# Patient Record
Sex: Female | Born: 1945
Health system: Southern US, Community
[De-identification: ages and names within clinical notes are randomized; demographics above are authoritative.]

## PROBLEM LIST (undated history)

## (undated) DIAGNOSIS — T8859XA Other complications of anesthesia, initial encounter: Secondary | ICD-10-CM

## (undated) DIAGNOSIS — I679 Cerebrovascular disease, unspecified: Secondary | ICD-10-CM

## (undated) DIAGNOSIS — D249 Benign neoplasm of unspecified breast: Secondary | ICD-10-CM

## (undated) DIAGNOSIS — G459 Transient cerebral ischemic attack, unspecified: Secondary | ICD-10-CM

## (undated) DIAGNOSIS — T4145XA Adverse effect of unspecified anesthetic, initial encounter: Secondary | ICD-10-CM

## (undated) DIAGNOSIS — E785 Hyperlipidemia, unspecified: Secondary | ICD-10-CM

## (undated) DIAGNOSIS — L409 Psoriasis, unspecified: Secondary | ICD-10-CM

## (undated) DIAGNOSIS — M8580 Other specified disorders of bone density and structure, unspecified site: Secondary | ICD-10-CM

## (undated) DIAGNOSIS — I639 Cerebral infarction, unspecified: Secondary | ICD-10-CM

## (undated) HISTORY — DX: Transient cerebral ischemic attack, unspecified: G45.9

## (undated) HISTORY — PX: ABDOMINAL HYSTERECTOMY: SHX81

## (undated) HISTORY — DX: Cerebrovascular disease, unspecified: I67.9

## (undated) HISTORY — DX: Benign neoplasm of unspecified breast: D24.9

## (undated) HISTORY — DX: Psoriasis, unspecified: L40.9

## (undated) HISTORY — DX: Other specified disorders of bone density and structure, unspecified site: M85.80

## (undated) HISTORY — DX: Hyperlipidemia, unspecified: E78.5

---

## 1973-01-06 DIAGNOSIS — D249 Benign neoplasm of unspecified breast: Secondary | ICD-10-CM

## 1973-01-06 HISTORY — DX: Benign neoplasm of unspecified breast: D24.9

## 1985-01-06 HISTORY — PX: PARTIAL HYSTERECTOMY: SHX80

## 1996-01-07 HISTORY — PX: OTHER SURGICAL HISTORY: SHX169

## 1998-08-09 ENCOUNTER — Other Ambulatory Visit: Admission: RE | Admit: 1998-08-09 | Discharge: 1998-08-09 | Payer: Self-pay | Admitting: Gynecology

## 2000-04-06 HISTORY — PX: OTHER SURGICAL HISTORY: SHX169

## 2000-04-28 ENCOUNTER — Encounter: Payer: Self-pay | Admitting: Orthopedic Surgery

## 2000-04-28 ENCOUNTER — Encounter: Admission: RE | Admit: 2000-04-28 | Discharge: 2000-04-28 | Payer: Self-pay | Admitting: Orthopedic Surgery

## 2000-08-05 ENCOUNTER — Encounter (INDEPENDENT_AMBULATORY_CARE_PROVIDER_SITE_OTHER): Payer: Self-pay

## 2000-08-05 ENCOUNTER — Other Ambulatory Visit: Admission: RE | Admit: 2000-08-05 | Discharge: 2000-08-05 | Payer: Self-pay | Admitting: Gastroenterology

## 2001-07-13 ENCOUNTER — Other Ambulatory Visit: Admission: RE | Admit: 2001-07-13 | Discharge: 2001-07-13 | Payer: Self-pay | Admitting: *Deleted

## 2001-10-06 LAB — FECAL OCCULT BLOOD, GUAIAC: Fecal Occult Blood: NEGATIVE

## 2003-08-07 ENCOUNTER — Encounter: Payer: Self-pay | Admitting: Family Medicine

## 2003-11-08 ENCOUNTER — Ambulatory Visit: Payer: Self-pay | Admitting: Family Medicine

## 2003-11-17 ENCOUNTER — Ambulatory Visit: Payer: Self-pay | Admitting: Family Medicine

## 2004-01-17 ENCOUNTER — Ambulatory Visit: Payer: Self-pay | Admitting: Family Medicine

## 2004-03-05 ENCOUNTER — Ambulatory Visit: Payer: Self-pay | Admitting: Family Medicine

## 2004-06-18 ENCOUNTER — Ambulatory Visit (HOSPITAL_COMMUNITY): Admission: RE | Admit: 2004-06-18 | Discharge: 2004-06-18 | Payer: Self-pay | Admitting: Orthopaedic Surgery

## 2004-11-06 ENCOUNTER — Ambulatory Visit: Payer: Self-pay | Admitting: Family Medicine

## 2004-12-24 ENCOUNTER — Ambulatory Visit: Payer: Self-pay | Admitting: Family Medicine

## 2005-01-03 ENCOUNTER — Ambulatory Visit: Payer: Self-pay | Admitting: Family Medicine

## 2005-04-18 ENCOUNTER — Ambulatory Visit: Payer: Self-pay | Admitting: Family Medicine

## 2005-06-30 ENCOUNTER — Ambulatory Visit: Payer: Self-pay | Admitting: Family Medicine

## 2005-10-22 ENCOUNTER — Ambulatory Visit: Payer: Self-pay | Admitting: Gastroenterology

## 2005-10-23 ENCOUNTER — Ambulatory Visit: Payer: Self-pay | Admitting: Gastroenterology

## 2005-11-06 ENCOUNTER — Ambulatory Visit: Payer: Self-pay | Admitting: Gastroenterology

## 2005-11-06 LAB — CONVERTED CEMR LAB
Fecal Occult Blood: NEGATIVE
OCCULT 1: NEGATIVE
OCCULT 2: NEGATIVE
OCCULT 4: NEGATIVE
OCCULT 5: NEGATIVE

## 2006-02-25 ENCOUNTER — Ambulatory Visit (HOSPITAL_COMMUNITY): Admission: RE | Admit: 2006-02-25 | Discharge: 2006-02-25 | Payer: Self-pay | Admitting: Family Medicine

## 2006-03-11 ENCOUNTER — Ambulatory Visit: Payer: Self-pay | Admitting: Family Medicine

## 2006-05-06 ENCOUNTER — Ambulatory Visit (HOSPITAL_COMMUNITY): Admission: RE | Admit: 2006-05-06 | Discharge: 2006-05-06 | Payer: Self-pay | Admitting: Orthopaedic Surgery

## 2006-06-30 ENCOUNTER — Encounter: Payer: Self-pay | Admitting: Family Medicine

## 2006-06-30 DIAGNOSIS — E785 Hyperlipidemia, unspecified: Secondary | ICD-10-CM | POA: Insufficient documentation

## 2006-06-30 DIAGNOSIS — R7303 Prediabetes: Secondary | ICD-10-CM | POA: Insufficient documentation

## 2006-06-30 DIAGNOSIS — T7840XA Allergy, unspecified, initial encounter: Secondary | ICD-10-CM | POA: Insufficient documentation

## 2006-06-30 HISTORY — DX: Hyperlipidemia, unspecified: E78.5

## 2006-07-03 ENCOUNTER — Ambulatory Visit: Payer: Self-pay | Admitting: Family Medicine

## 2006-07-05 LAB — CONVERTED CEMR LAB
ALT: 41 units/L — ABNORMAL HIGH (ref 0–35)
AST: 39 units/L — ABNORMAL HIGH (ref 0–37)
Albumin: 4.1 g/dL (ref 3.5–5.2)
Alkaline Phosphatase: 60 units/L (ref 39–117)
BUN: 9 mg/dL (ref 6–23)
CO2: 30 meq/L (ref 19–32)
Calcium: 9 mg/dL (ref 8.4–10.5)
Chloride: 105 meq/L (ref 96–112)
Creatinine, Ser: 0.7 mg/dL (ref 0.4–1.2)
TSH: 3.67 microintl units/mL (ref 0.35–5.50)
Total Bilirubin: 1 mg/dL (ref 0.3–1.2)
VLDL: 20 mg/dL (ref 0–40)

## 2006-07-07 ENCOUNTER — Ambulatory Visit: Payer: Self-pay | Admitting: Family Medicine

## 2006-07-07 DIAGNOSIS — S335XXA Sprain of ligaments of lumbar spine, initial encounter: Secondary | ICD-10-CM | POA: Insufficient documentation

## 2006-09-30 ENCOUNTER — Ambulatory Visit: Payer: Self-pay | Admitting: Family Medicine

## 2006-10-05 ENCOUNTER — Encounter (INDEPENDENT_AMBULATORY_CARE_PROVIDER_SITE_OTHER): Payer: Self-pay | Admitting: *Deleted

## 2006-10-06 ENCOUNTER — Ambulatory Visit: Payer: Self-pay | Admitting: Family Medicine

## 2006-10-06 DIAGNOSIS — R740 Nonspecific elevation of levels of transaminase and lactic acid dehydrogenase [LDH]: Secondary | ICD-10-CM

## 2006-10-06 DIAGNOSIS — R7401 Elevation of levels of liver transaminase levels: Secondary | ICD-10-CM | POA: Insufficient documentation

## 2006-10-06 LAB — CONVERTED CEMR LAB: AST: 34 units/L (ref 0–37)

## 2006-10-19 ENCOUNTER — Ambulatory Visit: Payer: Self-pay | Admitting: Family Medicine

## 2007-03-03 ENCOUNTER — Encounter: Payer: Self-pay | Admitting: Family Medicine

## 2007-04-27 ENCOUNTER — Telehealth: Payer: Self-pay | Admitting: Family Medicine

## 2007-06-30 ENCOUNTER — Emergency Department (HOSPITAL_COMMUNITY): Admission: EM | Admit: 2007-06-30 | Discharge: 2007-06-30 | Payer: Self-pay | Admitting: Emergency Medicine

## 2007-07-08 ENCOUNTER — Ambulatory Visit: Payer: Self-pay | Admitting: Family Medicine

## 2007-07-08 LAB — CONVERTED CEMR LAB
ALT: 19 units/L (ref 0–35)
AST: 24 units/L (ref 0–37)
Albumin: 3.9 g/dL (ref 3.5–5.2)
Alkaline Phosphatase: 50 units/L (ref 39–117)
BUN: 10 mg/dL (ref 6–23)
Bilirubin, Direct: 0.1 mg/dL (ref 0.0–0.3)
CO2: 30 meq/L (ref 19–32)
Calcium: 9.1 mg/dL (ref 8.4–10.5)
Chloride: 104 meq/L (ref 96–112)
Cholesterol: 181 mg/dL (ref 0–200)
Creatinine, Ser: 0.8 mg/dL (ref 0.4–1.2)
Creatinine,U: 243.2 mg/dL
GFR calc Af Amer: 93 mL/min
GFR calc non Af Amer: 77 mL/min
Glucose, Bld: 110 mg/dL — ABNORMAL HIGH (ref 70–99)
HDL: 41.7 mg/dL (ref >39.0)
LDL Cholesterol: 120 mg/dL — ABNORMAL HIGH (ref 0–99)
Microalb Creat Ratio: 4.1 mg/g (ref 0.0–30.0)
Microalb, Ur: 1 mg/dL (ref 0.0–1.9)
Potassium: 4 meq/L (ref 3.5–5.1)
Sodium: 141 meq/L (ref 135–145)
TSH: 3.57 microintl units/mL (ref 0.35–5.50)
Total Bilirubin: 0.9 mg/dL (ref 0.3–1.2)
Total CHOL/HDL Ratio: 4.3
Total Protein: 7 g/dL (ref 6.0–8.3)
Triglycerides: 95 mg/dL (ref 0–149)
VLDL: 19 mg/dL (ref 0–40)

## 2007-07-13 ENCOUNTER — Ambulatory Visit: Payer: Self-pay | Admitting: Family Medicine

## 2007-09-20 ENCOUNTER — Telehealth: Payer: Self-pay | Admitting: Family Medicine

## 2007-10-12 ENCOUNTER — Ambulatory Visit: Payer: Self-pay | Admitting: Internal Medicine

## 2007-10-25 ENCOUNTER — Encounter: Payer: Self-pay | Admitting: Internal Medicine

## 2007-10-25 ENCOUNTER — Ambulatory Visit: Payer: Self-pay | Admitting: Internal Medicine

## 2007-10-25 LAB — HM COLONOSCOPY

## 2007-10-26 ENCOUNTER — Encounter: Payer: Self-pay | Admitting: Internal Medicine

## 2007-10-26 ENCOUNTER — Ambulatory Visit: Payer: Self-pay | Admitting: Family Medicine

## 2007-10-27 DIAGNOSIS — K5730 Diverticulosis of large intestine without perforation or abscess without bleeding: Secondary | ICD-10-CM

## 2007-10-27 DIAGNOSIS — D126 Benign neoplasm of colon, unspecified: Secondary | ICD-10-CM | POA: Insufficient documentation

## 2007-10-27 HISTORY — DX: Diverticulosis of large intestine without perforation or abscess without bleeding: K57.30

## 2008-03-07 ENCOUNTER — Encounter: Payer: Self-pay | Admitting: Family Medicine

## 2008-05-25 ENCOUNTER — Emergency Department (HOSPITAL_COMMUNITY): Admission: EM | Admit: 2008-05-25 | Discharge: 2008-05-25 | Payer: Self-pay | Admitting: Family Medicine

## 2008-10-18 ENCOUNTER — Ambulatory Visit: Payer: Self-pay | Admitting: Family Medicine

## 2008-10-18 LAB — CONVERTED CEMR LAB
ALT: 18 units/L (ref 0–35)
BUN: 9 mg/dL (ref 6–23)
Basophils Absolute: 0 10*3/uL (ref 0.0–0.1)
CO2: 31 meq/L (ref 19–32)
Chloride: 104 meq/L (ref 96–112)
Cholesterol: 157 mg/dL (ref 0–200)
Creatinine, Ser: 0.8 mg/dL (ref 0.4–1.2)
Eosinophils Absolute: 0.1 10*3/uL (ref 0.0–0.7)
HCT: 40.8 % (ref 36.0–46.0)
Hemoglobin: 14.1 g/dL (ref 12.0–15.0)
Lymphs Abs: 1.4 10*3/uL (ref 0.7–4.0)
MCHC: 34.6 g/dL (ref 30.0–36.0)
MCV: 98.9 fL (ref 78.0–100.0)
Neutro Abs: 2.1 10*3/uL (ref 1.4–7.7)
RDW: 12.2 % (ref 11.5–14.6)
Total Protein: 7.4 g/dL (ref 6.0–8.3)
Triglycerides: 53 mg/dL (ref 0.0–149.0)

## 2008-10-23 ENCOUNTER — Ambulatory Visit: Payer: Self-pay | Admitting: Family Medicine

## 2008-10-23 DIAGNOSIS — M79609 Pain in unspecified limb: Secondary | ICD-10-CM | POA: Insufficient documentation

## 2008-10-23 DIAGNOSIS — R1310 Dysphagia, unspecified: Secondary | ICD-10-CM | POA: Insufficient documentation

## 2008-12-14 ENCOUNTER — Ambulatory Visit: Payer: Self-pay | Admitting: Family Medicine

## 2009-01-24 ENCOUNTER — Telehealth: Payer: Self-pay | Admitting: Family Medicine

## 2009-03-14 ENCOUNTER — Encounter: Payer: Self-pay | Admitting: Family Medicine

## 2009-04-18 ENCOUNTER — Ambulatory Visit: Payer: Self-pay | Admitting: Family Medicine

## 2009-04-18 DIAGNOSIS — R030 Elevated blood-pressure reading, without diagnosis of hypertension: Secondary | ICD-10-CM | POA: Insufficient documentation

## 2009-04-18 DIAGNOSIS — F489 Nonpsychotic mental disorder, unspecified: Secondary | ICD-10-CM | POA: Insufficient documentation

## 2009-05-30 ENCOUNTER — Ambulatory Visit: Payer: Self-pay | Admitting: Family Medicine

## 2009-08-09 ENCOUNTER — Encounter (INDEPENDENT_AMBULATORY_CARE_PROVIDER_SITE_OTHER): Payer: Self-pay | Admitting: *Deleted

## 2009-10-11 ENCOUNTER — Encounter: Payer: Self-pay | Admitting: Family Medicine

## 2009-10-11 ENCOUNTER — Telehealth: Payer: Self-pay | Admitting: Family Medicine

## 2010-02-05 NOTE — Assessment & Plan Note (Signed)
Summary: FOLLOW UP / LFW   Vital Signs:  Patient profile:   65 year old female Weight:      158.25 pounds Temp:     98.1 degrees F oral Pulse rate:   68 / minute Pulse rhythm:   regular BP sitting:   160 / 94  (left arm) Cuff size:   regular  Vitals Entered By: Sydell Axon LPN (April 18, 2009 2:22 PM) CC: 4 mouth follow-up   History of Present Illness: Pt here for 4 mos recheck. She has been at work. Has significant stress there and feels that probably has negatively affected her BP.  She saw Dr Londell Moh for her skin and was told she has a lipoma of the supraclavicular area. Yest she developed pain from the sciatic notch on the right. She denies trauma to area.  She also had an pisode of awakening in the middle of the night taking deep breaths and feeling air starved. She does not remember a nightmare but feel she was probably anxious from something she had been dreaming. She has had this happen a few times in the past, twice now in the last two weeks.  Problems Prior to Update: 1)  Swelling Unspec Nature Bone Soft Tissue&skin  (ICD-239.2) 2)  Problems With Swallowing and Mastication  (ICD-V41.6) 3)  Leg Pain, Bilateral  (ICD-729.5) 4)  Diverticulosis of Colon  (ICD-562.10) 5)  Colonic Polyps  (ICD-211.3) 6)  Elevation, Transaminase/ldh Levels  (ICD-790.4) 7)  Screening For Malignannt Neoplasm, Site Nec  (ICD-V76.49) 8)  Lumbar Strain  (ICD-847.2) 9)  Health Maintenance Exam  (ICD-V70.0) 10)  Glucose Intolerance, Hx of  (ICD-V12.2) 11)  Allergy, Environmental  (ICD-995.3) 12)  Hyperlipidemia  (ICD-272.4)  Medications Prior to Update: 1)  Pravastatin Sodium 40 Mg Tabs (Pravastatin Sodium) .... Take 1 Tablet By Mouth At Bedtime 2)  K-Tabs   Tbcr (Potassium Chloride Tbcr) 3)  Vitamin E Mtc   Caps (Vitamin E Caps) 4)  Vitamin C   Tabs (Ascorbic Acid Tabs) 5)  Cranberry Pills 6)  Calcium 500/d   Tabs (Calcium Carbonate-Vitamin D Tabs) .... Three Times A Day 7)  Fish Oil    Caps (Omega-3 Fatty Acids Caps) .... Two Times A Day 8)  Claritin 10 Mg  Tabs (Loratadine) .... As Needed 9)  Vitamin B-12   Tabs (Cyanocobalamin Tabs) 10)  Tylenol Arthritis Pain   Tbcr (Acetaminophen Tbcr) 11)  Cinnamon   Caps (Cinnamon Caps) .... Two Per Day 12)  Hydrocortisone 2.5 % Ext Crea (Hydrocortisone) .... Apply To Area Two Times A Day As Needed  Allergies: 1)  ! Penicillin V Potassium 2)  ! Sulfazine 3)  ! Bayer Aspirin 4)  ! Benadryl 5)  ! * Clorox 6)  ! Epinephrine 7)  ! * Latex 8)  ! * Eggs  Physical Exam  General:  Well-developed,well-nourished,in no acute distress; alert,appropriate and cooperative throughout examination, nontoxic. Head:  Normocephalic and atraumatic without obvious abnormalities. No apparent alopecia or balding. Sinuses nontender. Eyes:  Conjunctiva clear bilaterally.  Very small early skin tag on the right lid margin. Ears:  External ear exam shows no significant lesions or deformities.  Otoscopic examination reveals clear canals, tympanic membranes are intact bilaterally, retraction, inflammation or discharge. Hearing is slightly decreased  bilaterally. TMs dull. Nose:  External nasal examination shows no deformity or inflammation. Nasal mucosa are pink and moist without lesions or exudates. Mouth:  Oral mucosa and oropharynx without lesions or exudates.  Teeth in good repair. Roof  of mouth nml. Neck:  No deformities, masses, or tenderness noted. Chest Wall:  No deformities, masses, or tenderness noted. Lungs:  Normal respiratory effort, chest expands symmetrically. Lungs are clear to auscultation, no crackles or wheezes. Heart:  Normal rate and regular rhythm. S1 and S2 normal without gallop, murmur, click, rub or other extra sounds. Abdomen:  Bowel sounds positive,abdomen soft and non-tender without masses, organomegaly or hernias noted. Skin:  Benign moles sparsely distributed. 2 cm midclavicular soft tissue swelling, now believed to be  cyst. 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 Use heat and ice. Her updated medication list for this problem includes:    Tylenol Arthritis Pain Tbcr (Acetaminophen tbcr)  Discussed use of moist heat or ice, modified activities, medications, and stretching/strengthening exercises. Back care instructions given. To be seen in 2 weeks if no improvement; sooner if worsening of symptoms.   Problem # 2:  TENSION (ICD-300.9) Assessment: New Discussed ways to combat. Probably reason for awakenings.  Could make BP high.  Problem # 3:  ELEVATED BLOOD PRESSURE WITHOUT DIAGNOSIS OF HYPERTENSION (ICD-796.2) Assessment: New Will follow. Check BP at home regularly once a day the same relative tiome and RTC one week if generally higher than 130/80.  Complete Medication List: 1)  Pravastatin Sodium 40 Mg Tabs (Pravastatin sodium) .... Take 1 tablet by mouth at bedtime 2)  K-tabs Tbcr (Potassium chloride tbcr) 3)  Vitamin E Mtc Caps (Vitamin e caps) 4)  Vitamin C Tabs (Ascorbic acid tabs) 5)  Cranberry Pills  6)  Calcium 500/d Tabs (Calcium carbonate-vitamin d tabs) .... Three times a day 7)  Fish Oil Caps (Omega-3 fatty acids caps) .... Two times a day 8)  Claritin 10 Mg Tabs (Loratadine) .... As needed 9)  Vitamin B-12 Tabs (Cyanocobalamin tabs) .... Take one by mouth daily 10)  Tylenol Arthritis Pain Tbcr (Acetaminophen tbcr) 11)  Cinnamon Caps (Cinnamon caps) .... Two per day 12)  Hydrocortisone 2.5 % Ext Crea (Hydrocortisone) .... Apply to area two times a day as needed  Patient Instructions: 1)  RTC 1 week for BP check. Check BP at home at same time of day. Should be 130/80 or lower.  Current Allergies (reviewed today): !  PENICILLIN V POTASSIUM ! SULFAZINE ! BAYER ASPIRIN ! BENADRYL ! * CLOROX ! EPINEPHRINE ! * LATEX ! * EGGS

## 2010-02-05 NOTE — Progress Notes (Signed)
Summary: needs letter for flu shot  Phone Note Call from Patient Call back at Home Phone [phone]   Caller: Patient Call For: Shaune Leeks MD Summary of Call: Pt is asking for a letter stating she cant get a flu shot because she is allergic to eggs, please call when ready. Initial call taken by: Lowella Petties CMA,  October 11, 2009 9:36 AM  Follow-up for Phone Call        Typed as requested. Follow-up by: Shaune Leeks MD,  October 11, 2009 1:14 PM  Additional Follow-up for Phone Call Additional follow up Details #1::        LMOM, per pt, that letter is ready. Additional Follow-up by: Lowella Petties CMA,  October 11, 2009 2:24 PM

## 2010-02-05 NOTE — Assessment & Plan Note (Signed)
Summary: CHECK BP,THUMB PAIN/CLE   Vital Signs:  Patient profile:   65 year old female Weight:      156 pounds Temp:     98.4 degrees F oral Pulse rate:   84 / minute Pulse rhythm:   regular BP sitting:   114 / 80  (left arm) Cuff size:   regular  Vitals Entered By: Sydell Axon LPN (May 30, 2009 8:15 AM) CC: Check BP, left thumb pain, BP with patient's machine 138/80   History of Present Illness: Pt here for recheck of BP. Her machine, when checked here, reads slightly high systolically. 138/80 (her machine) vs 114/80 by [place].  Her nos at home appeared to be fluctuating and it appears her machine is probably on the fritz. 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 She is still having fatigue problems.  She had some cream given to her by a friend, Voltaren Gel, and shen would like to try it. We discussed the poss side effects. She would like a prescription. She would also like a prescription for spray kenalog for her psoriasis as it is much easier to apply tio the scalp. She got a sample from another doctor last year and it works really well.  Problems Prior to Update: 1)  Elevated Blood Pressure Without Diagnosis of Hypertension  (ICD-796.2) 2)  Tension  (ICD-300.9) 3)  Sciatica, Right From Sciatic Notch  (ICD-724.3) 4)  Swelling Unspec Nature Bone Soft Tissue&skin  (ICD-239.2) 5)  Problems With Swallowing and Mastication  (ICD-V41.6) 6)  Leg Pain, Bilateral  (ICD-729.5) 7)  Diverticulosis of Colon  (ICD-562.10) 8)  Colonic Polyps  (ICD-211.3) 9)  Elevation, Transaminase/ldh Levels  (ICD-790.4) 10)  Screening For Malignannt Neoplasm, Site Nec  (ICD-V76.49) 11)  Lumbar Strain  (ICD-847.2) 12)  Health Maintenance Exam  (ICD-V70.0) 13)  Glucose Intolerance, Hx of  (ICD-V12.2) 14)  Allergy, Environmental  (ICD-995.3) 15)  Hyperlipidemia  (ICD-272.4)  Medications Prior to Update: 1)  Pravastatin Sodium 40 Mg Tabs (Pravastatin Sodium) .... Take 1 Tablet By Mouth At Bedtime 2)  K-Tabs   Tbcr  (Potassium Chloride Tbcr) 3)  Vitamin E Mtc   Caps (Vitamin E Caps) 4)  Vitamin C   Tabs (Ascorbic Acid Tabs) 5)  Cranberry Pills 6)  Calcium 500/d   Tabs (Calcium Carbonate-Vitamin D Tabs) .... Three Times A Day 7)  Fish Oil   Caps (Omega-3 Fatty Acids Caps) .... Two Times A Day 8)  Claritin 10 Mg  Tabs (Loratadine) .... As Needed 9)  Vitamin B-12   Tabs (Cyanocobalamin Tabs) .... Take One By Mouth Daily 10)  Tylenol Arthritis Pain   Tbcr (Acetaminophen Tbcr) 11)  Cinnamon   Caps (Cinnamon Caps) .... Two Per Day 12)  Hydrocortisone 2.5 % Ext Crea (Hydrocortisone) .... Apply To Area Two Times A Day As Needed  Allergies: 1)  ! Penicillin V Potassium 2)  ! Sulfazine 3)  ! Bayer Aspirin 4)  ! Benadryl 5)  ! * Clorox 6)  ! Epinephrine 7)  ! * Latex 8)  ! * Eggs  Physical Exam  General:  Well-developed,well-nourished,in no acute distress; alert,appropriate and cooperative throughout examination, nontoxic. Head:  Normocephalic and atraumatic without obvious abnormalities. No apparent alopecia or balding. Sinuses nontender. Eyes:  Conjunctiva clear bilaterally.  Very small early skin tag on the right lid margin. Ears:  External ear exam shows no significant lesions or deformities.  Otoscopic examination reveals clear canals, tympanic membranes are intact bilaterally, retraction, inflammation or discharge. Hearing is slightly decreased  bilaterally. TMs dull. Nose:  External nasal examination shows no deformity or inflammation. Nasal mucosa are pink and moist without lesions or exudates. Mouth:  Oral mucosa and oropharynx without lesions or exudates.  Teeth in good repair. Roof of mouth nml. Neck:  No deformities, masses, or tenderness noted. Lungs:  Normal respiratory effort, chest expands symmetrically. Lungs are clear to auscultation, no crackles or wheezes. Heart:  Normal rate and regular rhythm. S1 and S2 normal without gallop, murmur, click, rub or other extra sounds. Extremities:   Left thumb with mild swelling that appears from trauma....poss ligamentous involvement that is minimal.   Impression & Recommendations:  Problem # 1:  ELEVATED BLOOD PRESSURE WITHOUT DIAGNOSIS OF HYPERTENSION (ICD-796.2) Assessment Improved Normal today. Will cont to observe. No trmt apparently needed at this point. BP today: 114/80 Prior BP: 160/94 (04/18/2009)  Labs Reviewed: Creat: 0.8 (10/18/2008) Chol: 157 (10/18/2008)   HDL: 40.00 (10/18/2008)   LDL: 106 (10/18/2008)   TG: 53.0 (10/18/2008)  Instructed in low sodium diet (DASH Handout) and behavior modification.    Complete Medication List: 1)  Pravastatin Sodium 40 Mg Tabs (Pravastatin sodium) .... Take 1 tablet by mouth at bedtime 2)  K-tabs Tbcr (Potassium chloride tbcr) 3)  Vitamin E Mtc Caps (Vitamin e caps) 4)  Vitamin C Tabs (Ascorbic acid tabs) 5)  Cranberry Pills  6)  Calcium 500/d Tabs (Calcium carbonate-vitamin d tabs) .... Three times a day 7)  Fish Oil Caps (Omega-3 fatty acids caps) .... Two times a day 8)  Claritin 10 Mg Tabs (Loratadine) .... As needed 9)  Vitamin B-12 Tabs (Cyanocobalamin tabs) .... Take one by mouth daily 10)  Tylenol Arthritis Pain Tbcr (Acetaminophen tbcr) 11)  Cinnamon Caps (Cinnamon caps) .... Two per day 12)  Hydrocortisone 2.5 % Ext Crea (Hydrocortisone) .... Apply to area two times a day as needed 13)  Voltaren 1 % Gel (Diclofenac sodium) .... Apply to area two times a day 14)  Kenalog Aers (Triamcinolone acetonide) 15)  Kenalog Aers (Triamcinolone acetonide) .... Spray onto area two times a day as needed for psoriasis  Patient Instructions: 1)  RTC as needed. Prescriptions: KENALOG  AERS (TRIAMCINOLONE ACETONIDE) spray onto area two times a day as needed for psoriasis  #1 can x 5   Entered and Authorized by:   Shaune Leeks MD   Signed by:   Shaune Leeks MD on 05/30/2009   Method used:   Print then Give to Patient   RxID:   [id number] VOLTAREN 1 % GEL  (DICLOFENAC SODIUM) apply to area two times a day  #1 tube x 5   Entered and Authorized by:   Shaune Leeks MD   Signed by:   Shaune Leeks MD on 05/30/2009   Method used:   Print then Give to Patient   RxID:   [id number]   Current Allergies (reviewed today): ! PENICILLIN V POTASSIUM ! SULFAZINE ! BAYER ASPIRIN ! BENADRYL ! * CLOROX ! EPINEPHRINE ! * LATEX ! * EGGS

## 2010-02-05 NOTE — Letter (Signed)
Summary: Nadara Eaton letter  [place] at [facility]  [address] [place], [place] [address]   Phone: [phone]  Fax: [phone]       08/09/2009 MRN: [id number]  CASSUNDRA MCKEEVER [address] [place], [place]  [address]  Dear Ms. [facility] [place], and [place] announce the retirement of Arta Silence, M.D., from full-time practice at the [facility] office effective July 05, 2009 and his plans of returning part-time.  It is important to Dr. Hetty Ely and to our practice that you understand that [facility] has seven physicians in our office for your health care needs.  We will continue to offer the same exceptional care that you have today.    Dr. Hetty Ely has spoken to many of you about his plans for retirement and returning part-time in the fall.   We will continue to work with you through the transition to schedule appointments for you in the office and meet the high standards that [facility] is committed to.   Again, it is with great pleasure that we share the news that Dr. Hetty Ely will return to [facility] in October of 2011 with a reduced schedule.    If you have any questions, or would like to request an appointment with one of our physicians, please call us at [phone] and press the option for Scheduling an appointment.  We take pleasure in providing you with excellent patient care and look forward to seeing you at your next office visit.  Our [facility] Physicians are:  Tillman Abide, M.D. Laurita Quint, M.D. Roxy Manns, M.D. Kerby Nora, M.D. Hannah Beat, M.D. Ruthe Mannan, M.D. We proudly welcomed Raechel Ache, M.D. and Eustaquio Boyden, M.D. to the practice in July/August 2011.  Sincerely,  [facility]

## 2010-02-05 NOTE — Letter (Signed)
Summary: Generic Letter  [place] at [facility]  [address] [place], [place] [address]   Phone: [phone]  Fax: [phone]    10/11/2009  VERENA SHAWGO [address] [place], [place]  [address]  Dear Ms. Joesphine Bare,           Sincerely,   Laurita Quint MD

## 2010-02-05 NOTE — Progress Notes (Signed)
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 Pt said you told her after seeing dermatologist if it was not a cyst to let you know. Pt will wait to hear from you on what to do next. Please advise.  Initial call taken by: Lewanda Rife LPN,  January 24, 2009 1:17 PM  Follow-up for Phone Call        Nothing to do . Lipoma is benign fatty collection under the skin. Requires no trmt. Follow-up by: Shaune Leeks MD,  January 24, 2009 1:30 PM  Additional Follow-up for Phone Call Additional follow up Details #1::        Left message for patient to call back. Lewanda Rife LPN  January 24, 2009 2:38 PM   Left message for patient to call back. Lewanda Rife LPN  January 25, 2009 4:25 PM   Patient notified as instructed by telephone. Lewanda Rife LPN  January 25, 2009 5:29 PM

## 2010-02-05 NOTE — Letter (Signed)
Summary: Generic Letter  [place] at [facility]  [address] [place], [place] [address]   Phone: [phone]  Fax: [phone]    10/11/2009  RE: [name] [address] [place], [place]  [address]   To Whom it may concern:  Mrs Marilyn Cook is a patient followed at [facility] in [place]. She is allergic to eggs. She should therefore not have the annual Flu shot.  Thank you for your concern.    Sincerely,   Laurita Quint MD

## 2010-03-20 ENCOUNTER — Encounter: Payer: Self-pay | Admitting: Family Medicine

## 2010-04-11 ENCOUNTER — Telehealth: Payer: Self-pay

## 2010-04-11 NOTE — Telephone Encounter (Signed)
Pt felt tired,pale,doesn't feel good,stressedfor 1 month, this AM when woke up was sweaty and chest pain left side thru to her back. No pain now. Has busy weekend scheduled and wants cked today. Dr Hetty Ely said could be muscular but if exertional pain needs to be seen in [place]. Notified pt and she said OK she would be seen.

## 2010-04-15 ENCOUNTER — Other Ambulatory Visit: Payer: Self-pay | Admitting: Family Medicine

## 2010-05-10 ENCOUNTER — Other Ambulatory Visit: Payer: Self-pay | Admitting: Family Medicine

## 2010-05-10 DIAGNOSIS — R7401 Elevation of levels of liver transaminase levels: Secondary | ICD-10-CM

## 2010-05-10 DIAGNOSIS — K5730 Diverticulosis of large intestine without perforation or abscess without bleeding: Secondary | ICD-10-CM

## 2010-05-10 DIAGNOSIS — K635 Polyp of colon: Secondary | ICD-10-CM

## 2010-05-10 DIAGNOSIS — R030 Elevated blood-pressure reading, without diagnosis of hypertension: Secondary | ICD-10-CM

## 2010-05-10 DIAGNOSIS — R7302 Impaired glucose tolerance (oral): Secondary | ICD-10-CM

## 2010-05-10 DIAGNOSIS — E7800 Pure hypercholesterolemia, unspecified: Secondary | ICD-10-CM

## 2010-05-14 ENCOUNTER — Encounter: Payer: Self-pay | Admitting: Family Medicine

## 2010-05-15 ENCOUNTER — Other Ambulatory Visit (INDEPENDENT_AMBULATORY_CARE_PROVIDER_SITE_OTHER): Payer: 59 | Admitting: Family Medicine

## 2010-05-15 DIAGNOSIS — K5730 Diverticulosis of large intestine without perforation or abscess without bleeding: Secondary | ICD-10-CM

## 2010-05-15 DIAGNOSIS — D126 Benign neoplasm of colon, unspecified: Secondary | ICD-10-CM

## 2010-05-15 DIAGNOSIS — R7402 Elevation of levels of lactic acid dehydrogenase (LDH): Secondary | ICD-10-CM

## 2010-05-15 DIAGNOSIS — R7309 Other abnormal glucose: Secondary | ICD-10-CM

## 2010-05-15 DIAGNOSIS — E7800 Pure hypercholesterolemia, unspecified: Secondary | ICD-10-CM

## 2010-05-15 DIAGNOSIS — R7302 Impaired glucose tolerance (oral): Secondary | ICD-10-CM

## 2010-05-15 DIAGNOSIS — R030 Elevated blood-pressure reading, without diagnosis of hypertension: Secondary | ICD-10-CM

## 2010-05-15 DIAGNOSIS — R7401 Elevation of levels of liver transaminase levels: Secondary | ICD-10-CM

## 2010-05-15 DIAGNOSIS — K635 Polyp of colon: Secondary | ICD-10-CM

## 2010-05-15 LAB — MICROALBUMIN / CREATININE URINE RATIO
Creatinine,U: 228.3 mg/dL
Microalb Creat Ratio: 2.1 mg/g (ref 0.0–30.0)
Microalb, Ur: 4.7 mg/dL — ABNORMAL HIGH (ref 0.0–1.9)

## 2010-05-15 LAB — HEPATIC FUNCTION PANEL
ALT: 22 U/L (ref 0–35)
AST: 23 U/L (ref 0–37)
Alkaline Phosphatase: 51 U/L (ref 39–117)
Bilirubin, Direct: 0 mg/dL (ref 0.0–0.3)
Total Protein: 7.2 g/dL (ref 6.0–8.3)

## 2010-05-15 LAB — LIPID PANEL
Total CHOL/HDL Ratio: 4
Triglycerides: 87 mg/dL (ref 0.0–149.0)

## 2010-05-15 LAB — CBC WITH DIFFERENTIAL/PLATELET
Basophils Absolute: 0 10*3/uL (ref 0.0–0.1)
Hemoglobin: 13.8 g/dL (ref 12.0–15.0)
Lymphocytes Relative: 31.9 % (ref 12.0–46.0)
Monocytes Relative: 10.3 % (ref 3.0–12.0)
Neutro Abs: 2.4 10*3/uL (ref 1.4–7.7)
RBC: 4.03 Mil/uL (ref 3.87–5.11)
RDW: 12.7 % (ref 11.5–14.6)

## 2010-05-15 LAB — TSH: TSH: 3.2 u[IU]/mL (ref 0.35–5.50)

## 2010-05-15 LAB — BASIC METABOLIC PANEL
CO2: 28 mEq/L (ref 19–32)
Chloride: 106 mEq/L (ref 96–112)
Creatinine, Ser: 0.7 mg/dL (ref 0.4–1.2)
Potassium: 4.2 mEq/L (ref 3.5–5.1)

## 2010-05-23 ENCOUNTER — Ambulatory Visit (INDEPENDENT_AMBULATORY_CARE_PROVIDER_SITE_OTHER): Payer: 59 | Admitting: Family Medicine

## 2010-05-23 ENCOUNTER — Encounter: Payer: Self-pay | Admitting: Family Medicine

## 2010-05-23 DIAGNOSIS — D126 Benign neoplasm of colon, unspecified: Secondary | ICD-10-CM

## 2010-05-23 DIAGNOSIS — Z8639 Personal history of other endocrine, nutritional and metabolic disease: Secondary | ICD-10-CM

## 2010-05-23 DIAGNOSIS — R7401 Elevation of levels of liver transaminase levels: Secondary | ICD-10-CM

## 2010-05-23 DIAGNOSIS — R030 Elevated blood-pressure reading, without diagnosis of hypertension: Secondary | ICD-10-CM

## 2010-05-23 DIAGNOSIS — E785 Hyperlipidemia, unspecified: Secondary | ICD-10-CM

## 2010-05-23 DIAGNOSIS — Z862 Personal history of diseases of the blood and blood-forming organs and certain disorders involving the immune mechanism: Secondary | ICD-10-CM

## 2010-05-23 DIAGNOSIS — F489 Nonpsychotic mental disorder, unspecified: Secondary | ICD-10-CM

## 2010-05-23 DIAGNOSIS — T7840XA Allergy, unspecified, initial encounter: Secondary | ICD-10-CM

## 2010-05-23 DIAGNOSIS — K5730 Diverticulosis of large intestine without perforation or abscess without bleeding: Secondary | ICD-10-CM

## 2010-05-23 DIAGNOSIS — R1310 Dysphagia, unspecified: Secondary | ICD-10-CM

## 2010-05-23 MED ORDER — PRAVASTATIN SODIUM 40 MG PO TABS: 40.0000 mg | ORAL_TABLET | Freq: Every day | ORAL | Status: DC

## 2010-05-23 NOTE — Assessment & Plan Note (Signed)
Discussed. Use good technique and especial care with chewing.

## 2010-05-23 NOTE — Assessment & Plan Note (Signed)
Improved. Normal today. Cont as is.

## 2010-05-23 NOTE — Assessment & Plan Note (Signed)
Continued good control. Will improve even more with weight loss.

## 2010-05-23 NOTE — Assessment & Plan Note (Signed)
Adequate. Cont Prava at current dose.

## 2010-05-23 NOTE — Progress Notes (Signed)
  Subjective:    Patient ID: Marilyn Cook, female    DOB: 09/06/1945, 65 y.o.   MRN: [id number]  HPI Pt here for Comp Exam. She had a hyst for fibroid tumors. She feels well with no complaints except not being able to concentrate and has mild fatigue. She falls to sleep easily watching TV. She gets 6 hrs of sleep typically..She is taking various herbal and vitamin products. Sister died from cancer and she feels she is not over that yet. She died 2023/01/11. 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    Review of Systems  Constitutional: Positive for fatigue. Negative for fever, chills, diaphoresis, activity change, appetite change and unexpected weight change.  HENT: Positive for hearing loss (mild,chronic.). Negative for ear pain, nosebleeds, rhinorrhea, trouble swallowing, tinnitus and ear discharge.   Eyes: Positive for discharge. Negative for pain, redness and visual disturbance.       Sees Opthalmology, suspicious for glaucoma.  Respiratory: Negative for cough, chest tightness, shortness of breath and wheezing.   Cardiovascular: Positive for chest pain (improved on its own.). Negative for palpitations and leg swelling.  Gastrointestinal: Negative for nausea, vomiting, abdominal pain, diarrhea, constipation, blood in stool and abdominal distention.       Diff swallowing.   Genitourinary: Negative for dysuria and frequency.  Musculoskeletal: Negative for myalgias, back pain and arthralgias.       Told she needs surgery on both feet by Podiatry.  Skin: Negative for rash.  Neurological: Negative for dizziness, tremors, syncope and numbness.  Hematological: Negative for adenopathy. Does not bruise/bleed easily.  Psychiatric/Behavioral: Negative for hallucinations and agitation. The patient is not nervous/anxious.        Objective:   Physical Exam  Constitutional: She is oriented to person, place, and time. She appears well-developed and well-nourished. No distress.  HENT:    Head: Normocephalic and atraumatic.  Left Ear: External ear normal.  Nose: Nose normal.  Mouth/Throat: Oropharynx is clear and moist. No oropharyngeal exudate.  Eyes: Conjunctivae and EOM are normal. Pupils are equal, round, and reactive to light. No scleral icterus.  Neck: Normal range of motion. Neck supple. No thyromegaly present.  Cardiovascular: Normal rate, regular rhythm and normal heart sounds.  Exam reveals no friction rub.   No murmur heard. Pulmonary/Chest: Effort normal and breath sounds normal. No respiratory distress. She has no wheezes. She has no rales.  Abdominal: Soft. Bowel sounds are normal. She exhibits no mass. There is no tenderness.  Genitourinary: Guaiac negative stool.       Bimanual only done Introitus wnl, Uterus and Cervix absent, Adnexa nontender w/o mass, ovaries not felt.   Musculoskeletal: Normal range of motion. She exhibits no edema and no tenderness.  Lymphadenopathy:    She has no cervical adenopathy.  Neurological: She is alert and oriented to person, place, and time. She has normal reflexes.  Skin: Skin is warm and dry. No rash noted. She is not diaphoretic. No erythema.       Mobile unchanged Lipoma at the A/C joint end  of the left clavicle.  Psychiatric: She has a normal mood and affect. Her behavior is normal. Judgment and thought content normal.          Assessment & Plan:

## 2010-05-23 NOTE — Assessment & Plan Note (Addendum)
Stable but continues. Encouraged to avoid sweets and carbs as much as possible. Microalb nml.

## 2010-05-24 NOTE — Assessment & Plan Note (Signed)
[facility]                           GASTROENTEROLOGY OFFICE NOTE   Marilyn Cook, Marilyn Cook                         MRN:          [id number]  DATE:10/23/2005                            DOB:          05/27/1945    REFERRING PHYSICIAN:  Pershing Cox, M.D.   PRIMARY CARE DOCTOR:  Arta Silence, MD   She says she has been having some dysphagia, usually that builds.  No  heartburn, no reflux symptoms.  Otherwise, she does have a great deal of  intestinal gas and bloating.  She says this occurs a great deal of her time,  and she has had some rectal bleeding, one episode predominantly.  She also  said she was concerned because she saw some strings in the stool and heard a  ping in the toilet bowl one day, which she was told might be from a kidney  stone, but she was not convinced of this.  She seems to have some concerns  about her colon, and I looked at her chart and noted that she has only had  some mild diverticulosis on the colonoscopic examination done on September 13, 2002, then she really did not need a followup until 2009.   PRIMARY MEDICATIONS:  Co-Gesic, Pravachol, Sudafed and some Tylenol.   PAST MEDICAL HISTORY:  Consists of hyperlipidemia, myalgias and she is  status post hysterectomy.   SURGICAL HISTORY:  Noncontributory.   SOCIAL HISTORY:  She does not drink or smoke.   REVIEW OF SYSTEMS:  Noncontributory, except for some arthritis and some back  pain.   PHYSICAL EXAMINATION:  VITAL SIGNS:  She is only 5 feet 3 inches, weight  167, blood pressure 120/80, pulse 60 and regular.  HEENT:  Oropharynx was negative.  NECK:  Negative.  Supraclavicular area was negative.  CHEST:  Clear to auscultation.  HEART:  Revealed a regular rhythm.  ABDOMEN:  Soft, no masses or organomegaly, nontender, no bruits or rubs.  RECTAL:  Deferred.  EXTREMITIES:  Minimal arthritis changes.   IMPRESSION:  1. Irritable bowel syndrome with  constipation component and some bright      red blood per rectum.  2. History of hyperlipidemia.  3. Status post hysterectomy.  4. Seasonal allergies.   RECOMMENDATIONS:  Get an ultrasound of her abdomen, because she has been  having some right upper quadrant pain as well.  Colorectal screens on her.  I gave her some Zegerid to try to see if this gave her anymore relief of her  GI system.  I had her on some __________, one a day, and I gave her a sample  for this, and we ordered some routine labs on her.  She says this has not  been done in quite some time.  I told her to return some time in the next  few weeks, and we will go back over all these results.  I think the  constipation could be causing some of the IBS symptoms with gas and  bloating, that maybe some MiraLax might be helpful to her, if the above  measures do  not help.  Maybe some Xifaxan would be helpful too, but because  of the cost I did not order this, but these are things that need to be kept  in mind if her symptoms continue.            ______________________________  Ulyess Mort, MD      SML/MedQ  DD:  10/22/2005  DT:  10/24/2005  Job #:  [id number]   cc:   Pershing Cox, M.D.

## 2010-05-25 MED ORDER — PRAVASTATIN SODIUM 40 MG PO TABS: 40.0000 mg | ORAL_TABLET | Freq: Every day | ORAL | Status: DC

## 2010-05-25 NOTE — Assessment & Plan Note (Signed)
Discussed death of her sister. Think she is actually doing pretty well for how recent her death. Come back if sxs don't seem to cont to improve.

## 2010-05-25 NOTE — Assessment & Plan Note (Signed)
Stable. Cont curr meds. 

## 2010-05-25 NOTE — Assessment & Plan Note (Signed)
Discussed being seen for p[rolonged LLQ discomfort. 

## 2010-05-25 NOTE — Assessment & Plan Note (Signed)
Colonoscopy UTD. 

## 2010-06-05 ENCOUNTER — Encounter: Payer: Self-pay | Admitting: Family Medicine

## 2010-10-11 ENCOUNTER — Telehealth: Payer: Self-pay | Admitting: *Deleted

## 2010-10-11 NOTE — Telephone Encounter (Signed)
Pt has brought in a form to be completed excusing her from getting mandatory flu vaccine through [facility].  Form is on your desk.

## 2010-10-16 NOTE — Telephone Encounter (Signed)
Left message on machine to call back  

## 2010-10-16 NOTE — Telephone Encounter (Signed)
Advised pt form is ready for pick up, form placed up front.

## 2010-10-16 NOTE — Telephone Encounter (Signed)
Egg Allergy checked per pt's history. Form completed.

## 2011-03-21 ENCOUNTER — Encounter: Payer: Self-pay | Admitting: Family Medicine

## 2011-03-26 ENCOUNTER — Encounter: Payer: Self-pay | Admitting: *Deleted

## 2011-04-14 ENCOUNTER — Encounter: Payer: Self-pay | Admitting: Family Medicine

## 2011-04-14 ENCOUNTER — Ambulatory Visit (INDEPENDENT_AMBULATORY_CARE_PROVIDER_SITE_OTHER): Payer: 59 | Admitting: Family Medicine

## 2011-04-14 VITALS — BP 140/92 | HR 78 | Temp 98.0°F | Wt 158.8 lb

## 2011-04-14 DIAGNOSIS — G479 Sleep disorder, unspecified: Secondary | ICD-10-CM | POA: Insufficient documentation

## 2011-04-14 DIAGNOSIS — A09 Infectious gastroenteritis and colitis, unspecified: Secondary | ICD-10-CM

## 2011-04-14 DIAGNOSIS — A084 Viral intestinal infection, unspecified: Secondary | ICD-10-CM | POA: Insufficient documentation

## 2011-04-14 DIAGNOSIS — G478 Other sleep disorders: Secondary | ICD-10-CM

## 2011-04-14 MED ORDER — NAPROXEN 500 MG PO TABS: 500.0000 mg | ORAL_TABLET | Freq: Two times a day (BID) | ORAL | Status: DC | PRN

## 2011-04-14 NOTE — Progress Notes (Signed)
  Subjective:    Patient ID: Marilyn Cook, female    DOB: 1945-04-23, 66 y.o.   MRN: [id number]  HPI CC: fatigue, not feeling well  Overall healthy HLD 66 yo with h/o HLD presents today.  Comes in today because had episode last week of not feeling well.  Started having jabbing pains in lower abdomen, associated with vomiting x1, then progressed to diarrhea.  A week prior something she ate caused stomach upset.  Also had chills with this illness.  Since then staying somewhat fatigued.  One night last week woke up hyperventilating.  Works at [employer] in [place].  Has been taking care of family, not herself.  Thinks significant stress from work.  No fevers, HA, chest pain/tightness.  No hematemesis, blood in stool or urine.  No black tarry stool.  No cough.  Husband sick now with similar sxs.  H/o colonoscopy, next due 2016.  Note sometimes awakens gasping and "hyperventilating".  Does snore.  May have gained some weight recently. Wt Readings from Last 3 Encounters:  04/14/11 158 lb 12 oz (72.009 kg)  05/23/10 158 lb 8 oz (71.895 kg)  05/30/09 156 lb (70.761 kg)    Review of Systems Per HPI    Objective:   Physical Exam  Nursing note and vitals reviewed. Constitutional: She appears well-developed and well-nourished. No distress.  HENT:  Head: Normocephalic and atraumatic.  Mouth/Throat: Oropharynx is clear and moist. No oropharyngeal exudate.  Eyes: Conjunctivae and EOM are normal. Pupils are equal, round, and reactive to light. No scleral icterus.  Neck: Normal range of motion. Neck supple.  Cardiovascular: Normal rate, regular rhythm, normal heart sounds and intact distal pulses.   No murmur heard. Pulmonary/Chest: Effort normal and breath sounds normal. No respiratory distress. She has no wheezes. She has no rales.  Abdominal: Soft. Bowel sounds are normal. She exhibits no distension and no mass. There is no tenderness. There is no rebound and no guarding.  Musculoskeletal: She  exhibits no edema.  Skin: Skin is warm and dry. No rash noted.  Psychiatric: She has a normal mood and affect.       Assessment & Plan:

## 2011-04-14 NOTE — Assessment & Plan Note (Signed)
Anticipate viral gastro, discussed this. If not improving , return to see [place]. Supportive care discussed.

## 2011-04-14 NOTE — Assessment & Plan Note (Addendum)
epworth sleepiness scale today - 13.  Moderate daytime somnolence. Pt would like to work on weight loss prior to pursuing sleep study. Reassess next visit.

## 2011-04-14 NOTE — Patient Instructions (Signed)
Sounds like you had viral gastroenteritis. I would expect this to improve with time - fatigue may be the last thing to improve. Most important thing is to ensure hydration status - so remember small sips throughout the day.

## 2011-06-09 ENCOUNTER — Other Ambulatory Visit: Payer: Self-pay | Admitting: Family Medicine

## 2011-07-20 ENCOUNTER — Other Ambulatory Visit: Payer: Self-pay | Admitting: Family Medicine

## 2011-07-20 DIAGNOSIS — D72819 Decreased white blood cell count, unspecified: Secondary | ICD-10-CM

## 2011-07-20 DIAGNOSIS — E785 Hyperlipidemia, unspecified: Secondary | ICD-10-CM

## 2011-07-22 ENCOUNTER — Other Ambulatory Visit: Payer: 59

## 2011-07-23 ENCOUNTER — Other Ambulatory Visit (INDEPENDENT_AMBULATORY_CARE_PROVIDER_SITE_OTHER): Payer: 59

## 2011-07-23 DIAGNOSIS — D72819 Decreased white blood cell count, unspecified: Secondary | ICD-10-CM

## 2011-07-23 DIAGNOSIS — E785 Hyperlipidemia, unspecified: Secondary | ICD-10-CM

## 2011-07-23 LAB — LIPID PANEL: Total CHOL/HDL Ratio: 3

## 2011-07-23 LAB — COMPREHENSIVE METABOLIC PANEL
ALT: 22 U/L (ref 0–35)
CO2: 27 mEq/L (ref 19–32)
Calcium: 9 mg/dL (ref 8.4–10.5)
Chloride: 107 mEq/L (ref 96–112)
GFR: 80.72 mL/min (ref >60.00)
Sodium: 142 mEq/L (ref 135–145)
Total Protein: 7.6 g/dL (ref 6.0–8.3)

## 2011-07-23 LAB — CBC WITH DIFFERENTIAL/PLATELET
Basophils Absolute: 0 10*3/uL (ref 0.0–0.1)
Eosinophils Absolute: 0.1 10*3/uL (ref 0.0–0.7)
HCT: 40.6 % (ref 36.0–46.0)
Lymphocytes Relative: 36.6 % (ref 12.0–46.0)
Lymphs Abs: 1.4 10*3/uL (ref 0.7–4.0)
MCHC: 33.1 g/dL (ref 30.0–36.0)
Monocytes Relative: 11.2 % (ref 3.0–12.0)
Platelets: 172 10*3/uL (ref 150.0–400.0)
RDW: 13.1 % (ref 11.5–14.6)

## 2011-07-24 LAB — VITAMIN B12: Vitamin B-12: 934 pg/mL — ABNORMAL HIGH (ref 211–911)

## 2011-07-31 ENCOUNTER — Ambulatory Visit (INDEPENDENT_AMBULATORY_CARE_PROVIDER_SITE_OTHER): Payer: 59 | Admitting: Family Medicine

## 2011-07-31 ENCOUNTER — Encounter: Payer: Self-pay | Admitting: Family Medicine

## 2011-07-31 VITALS — BP 130/80 | HR 73 | Temp 98.1°F | Ht 62.0 in | Wt 158.0 lb

## 2011-07-31 DIAGNOSIS — Z0000 Encounter for general adult medical examination without abnormal findings: Secondary | ICD-10-CM | POA: Insufficient documentation

## 2011-07-31 DIAGNOSIS — Z23 Encounter for immunization: Secondary | ICD-10-CM

## 2011-07-31 DIAGNOSIS — IMO0002 Reserved for concepts with insufficient information to code with codable children: Secondary | ICD-10-CM | POA: Insufficient documentation

## 2011-07-31 DIAGNOSIS — E785 Hyperlipidemia, unspecified: Secondary | ICD-10-CM

## 2011-07-31 MED ORDER — PRAVASTATIN SODIUM 40 MG PO TABS: 40.0000 mg | ORAL_TABLET | Freq: Every day | ORAL | Status: DC

## 2011-07-31 NOTE — Assessment & Plan Note (Signed)
Reviewed blood work in detail. Provided with low chol diet handout. good control on pravastatin.  May try co Q 10.

## 2011-07-31 NOTE — Patient Instructions (Addendum)
Good to see you, call us with questions. Tetanus and pneumonia shot today. Return as needed or in 1 year for next physical. I think you have right knee strain that should improve with time. Low cholesterol diet handout provided today

## 2011-07-31 NOTE — Addendum Note (Signed)
Addended by: Sueanne Margarita on: 07/31/2011 04:03 PM   Modules accepted: Orders

## 2011-07-31 NOTE — Assessment & Plan Note (Signed)
benign exam. Reassurance provided. Rest, ice, elevate.

## 2011-07-31 NOTE — Assessment & Plan Note (Signed)
Preventative protocols reviewed and updated unless pt declined. Tdap, pneumonia shot today.  Shouldn't be an issue with egg allergy. Discussed healthy diet and lifestyle.

## 2011-07-31 NOTE — Progress Notes (Signed)
Subjective:    Patient ID: Marilyn Cook, female    DOB: 05/12/1945, 66 y.o.   MRN: [id number]  HPI CC: CPE today  Also with several concerns.  Fall on Monday, tripped over tree limb, fell on crushed brick.  Would like R knee evaluated.  No locking, no knee instability.  Worse pain with going down stairs.  Knee pain getting better.  Preventative: Last physical was 1 yr ago Colonoscopy (2009) diverticulosis, 2 mm sigmoid polyp, (brodie).  Rec rpt 7 yrs. Mammogram 03/2011 - normal, rec rpt 1 yr Pap smear - bimanual only done last year.  Partial hysterectomy.  Thinks would like pelvic exam every few years. Tetanus - 2002.  Would like today.   Pneumonia shot - allergic to eggs.  Avoids flu shot.  Should be ok to receive pneumovax.  No recent vision exam.  Advised schedule.  1 fall in last year, none prior. Denies depression/anhedonia, sadness.  Medications and allergies reviewed and updated in chart.  Past histories reviewed and updated if relevant as below. Patient Active Problem List  Diagnosis  . COLONIC POLYPS  . HYPERLIPIDEMIA  . TENSION  . DIVERTICULOSIS OF COLON  . LEG PAIN, BILATERAL  . ELEVATED BLOOD PRESSURE WITHOUT DIAGNOSIS OF HYPERTENSION  . LUMBAR STRAIN  . ALLERGY, ENVIRONMENTAL  . GLUCOSE INTOLERANCE, HX OF  . PROBLEMS WITH SWALLOWING AND MASTICATION  . Sleep disturbance   Past Medical History  Diagnosis Date  . Hyperlipemia   . NSVD (normal spontaneous vaginal delivery)     x 1  . Benign tumor of breast 1975   Past Surgical History  Procedure Date  . Partial hysterectomy 1987    due to fibroids  . R lat epicondyle injections 1998  . Spiral ct 04/02    (-)   History  Substance Use Topics  . Smoking status: Never Smoker   . Smokeless tobacco: Never Used  . Alcohol Use: No   Family History  Problem Relation Age of Onset  . Heart disease Father     MI   . Stroke Father     CVA  . Stroke Mother     CVA  . Hypertension Sister   . Cancer  Sister     ovarian and colon cancer  . Aneurysm Brother   . Hypertension Brother   . Cancer Paternal Aunt     ? colon  . Cancer Paternal Grandfather     liver, alcohol  . Alcohol abuse Paternal Grandfather   . Hypertension Brother   . Cancer Other     prostate   Allergies  Allergen Reactions  . Aspirin   . Diphenhydramine Hcl   . Eggs Or Egg-Derived Products     REACTION: unspecified  . Epinephrine     REACTION: UNSPECIFIED  . Latex     REACTION: swelling  . Penicillins   . Sulfasalazine    Current Outpatient Prescriptions on File Prior to Visit  Medication Sig Dispense Refill  . acetaminophen (TYLENOL ARTHRITIS PAIN) 650 MG CR tablet as needed.        . Calcium Carb-Cholecalciferol (CALCIUM-VITAMIN D) 600-400 MG-UNIT TABS Take 1 tablet by mouth 3 (three) times daily.      . Cinnamon 500 MG capsule Take 500 mg by mouth 2 (two) times daily.        [name] CRANBERRY PO Take by mouth.        . cyanocobalamin 100 MCG tablet Take 100 mcg by mouth daily.        [name]  hydrocortisone 2.5 % cream Apply topically 2 (two) times daily as needed.        . loratadine (CLARITIN) 10 MG tablet Take 10 mg by mouth as needed.        . Omega-3 Fatty Acids (FISH OIL PO) Take 3 g by mouth daily.       . Potassium Chloride (K-TABS PO) Take by mouth.        . Pyridoxine HCl (VITAMIN B-6 PO) Take 1 tablet by mouth daily.      [name] triamcinolone (KENALOG) topical spray Apply topically 2 (two) times daily as needed. For psoriasis       . VITAMIN E COMPLEX PO Take 1 capsule by mouth daily.      [name] DISCONTD: pravastatin (PRAVACHOL) 40 MG tablet TAKE 1 TABLET BY MOUTH DAILY  30 tablet  0     Review of Systems  Constitutional: Negative for fever, chills, activity change, appetite change, fatigue and unexpected weight change.  HENT: Negative for hearing loss and neck pain.   Eyes: Negative for visual disturbance.  Respiratory: Negative for cough, chest tightness, shortness of breath and wheezing.   Cardiovascular:  Negative for chest pain, palpitations and leg swelling.  Gastrointestinal: Negative for nausea, vomiting, abdominal pain, diarrhea, constipation, blood in stool and abdominal distention.  Genitourinary: Negative for hematuria and difficulty urinating.  Musculoskeletal: Negative for myalgias and arthralgias.  Skin: Negative for rash.  Neurological: Negative for dizziness, seizures, syncope and headaches.  Hematological: Does not bruise/bleed easily.  Psychiatric/Behavioral: Negative for dysphoric mood. The patient is nervous/anxious (situational, father in law sick).        Objective:   Physical Exam  Nursing note and vitals reviewed. Constitutional: She is oriented to person, place, and time. She appears well-developed and well-nourished. No distress.  HENT:  Head: Normocephalic and atraumatic.  Right Ear: External ear normal.  Left Ear: External ear normal.  Nose: Nose normal.  Mouth/Throat: Oropharynx is clear and moist. No oropharyngeal exudate.  Eyes: Conjunctivae and EOM are normal. Pupils are equal, round, and reactive to light. No scleral icterus.  Neck: Normal range of motion. Neck supple. No thyromegaly present.  Cardiovascular: Normal rate, regular rhythm, normal heart sounds and intact distal pulses.   No murmur heard. Pulses:      Radial pulses are 2+ on the right side, and 2+ on the left side.  Pulmonary/Chest: Effort normal and breath sounds normal. No respiratory distress. She has no wheezes. She has no rales. Right breast exhibits no inverted nipple, no mass, no nipple discharge, no skin change and no tenderness. Left breast exhibits no inverted nipple, no mass, no nipple discharge, no skin change and no tenderness. Breasts are symmetrical.  Abdominal: Soft. Bowel sounds are normal. She exhibits no distension and no mass. There is no tenderness. There is no rebound and no guarding.  Musculoskeletal: Normal range of motion. She exhibits no edema.       R knee- FROM No  crepitus. No pain to palpation. No deformity No effusion/swelling. Neg drawer and mcmurray's  No PF grind.  Lymphadenopathy:    She has no cervical adenopathy.    She has no axillary adenopathy.       Right axillary: No lateral adenopathy present.       Left axillary: No lateral adenopathy present.      Right: No supraclavicular adenopathy present.       Left: No supraclavicular adenopathy present.  Neurological: She is alert and oriented to person, place, and  time.       CN grossly intact, station and gait intact  Skin: Skin is warm and dry. No rash noted.  Psychiatric: She has a normal mood and affect. Her behavior is normal. Judgment and thought content normal.       Assessment & Plan:

## 2011-08-08 ENCOUNTER — Telehealth: Payer: Self-pay | Admitting: Family Medicine

## 2011-08-08 NOTE — Telephone Encounter (Signed)
Caller: Shauntee/Patient; PCP: Eustaquio Boyden; CB#: [phone]; ;  Call regarding Immunizations  Given for Tetanus and Pneumonia On 07/31/11;  Onset of symptoms 08/03/11.  Patient states she feels sluggish, tired.   no fever, no rash, intermittent headaches,  no shortness of breathi, no n/v/d, no weakness in arms/legs ,no n/v/d.  Awake and oriented. Voice is clear  Reviewed information with patient regarding Tetanus and Pneumococcal Vaccine. Advised patient to closley mointor s/sx and follow up with the office for any questions, changes or concerns. Understanding expressed. Emergent s/sx ruled out per Medication Questions- Adult with home care advsied.

## 2011-08-08 NOTE — Telephone Encounter (Signed)
Noted. Thanks.  Possibly body's reaction to both shots could be sluggishness, should improve with time. Can we call monday for update?

## 2011-08-11 NOTE — Telephone Encounter (Signed)
Message left for patient to return my call and give update on how she is feeling.

## 2011-08-12 NOTE — Telephone Encounter (Signed)
Message left for patient to return my call.  

## 2011-08-13 NOTE — Telephone Encounter (Signed)
No return call from patient.

## 2011-08-15 NOTE — Telephone Encounter (Signed)
Pt walked in stated had problems getting thru on phone; Pt said has had h/a all week; took Tylenol this AM pain level now 2. 08/13/11 in the evening lightheadness on and off usually when pt bends over. Stomach feels "funny"( pt thinks related to nervousness about not feeling herself) Pt thinks h/a and lightheadness is coming from the pneumonia and tetanus shots. Pt BP today at work was 133/86. Pt does not want to be seen just wants doctors opinion why lightheaded and h/a. Please advise.

## 2011-08-15 NOTE — Telephone Encounter (Signed)
I called and LMOVM for patient.  Please call her Monday.  Thanks.

## 2011-08-18 NOTE — Telephone Encounter (Signed)
Called, straight to voicemail.  Marilyn Cook' can we call tomorrow for update on how she's feeling?

## 2011-08-19 NOTE — Telephone Encounter (Signed)
Message left for patient to return my call.  

## 2011-08-20 NOTE — Telephone Encounter (Signed)
Noted. Thanks.  i'm glad she's feeling better.  May very well been vaccines as given 2 at once.

## 2011-08-20 NOTE — Telephone Encounter (Signed)
Patient came by because she still cannot reach the office. She advised she is feeling better now. She wonders if she was feeling bad because of the vaccines, her sugar or her BP. I advised it could've been any of those things or something else entirely. I told her to keep a check on her BP and sugar and if they got higher than normal again or if she started feeling bad to schedule a follow up. She verbalized understanding.

## 2011-12-30 ENCOUNTER — Encounter: Payer: Self-pay | Admitting: Family Medicine

## 2011-12-30 ENCOUNTER — Ambulatory Visit (INDEPENDENT_AMBULATORY_CARE_PROVIDER_SITE_OTHER): Payer: 59 | Admitting: Family Medicine

## 2011-12-30 VITALS — BP 132/78 | HR 84 | Temp 97.5°F | Wt 154.8 lb

## 2011-12-30 DIAGNOSIS — M79609 Pain in unspecified limb: Secondary | ICD-10-CM

## 2011-12-30 DIAGNOSIS — M79622 Pain in left upper arm: Secondary | ICD-10-CM

## 2011-12-30 NOTE — Assessment & Plan Note (Signed)
Anticipate L biceps strain.  No evidence of DVT on exam today. Treat with ice/heat, tylenol (pt prefers to avoid NSAIDs), and stretching exercises from [facility] pt advisor. Update [place] if sxs not improving within 1 wk, consider [place]

## 2011-12-30 NOTE — Patient Instructions (Signed)
I wonder if this is a biceps strain - treat with ice/heat (whichever soothes better) and tylenol as needed. Rest arm for the next week. If any worsening or not better in 1 week then let me know, for possible ultrasound.

## 2011-12-30 NOTE — Progress Notes (Signed)
  Subjective:    Patient ID: Marilyn Cook, female    DOB: 1945/09/21, 66 y.o.   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  1 wk h/o L upper anterior arm pain and swelling.  Pain described anterior upper arm as well as some anterior forearm.  No hand pain or weakness.  Also feeling more fatigued recently.  Using tylenol prn.  No h/o blood clot, no family history of blood clots.  No recent prolonged periods of immobility.  No hormonal meds.  Past Medical History  Diagnosis Date  . Hyperlipemia   . NSVD (normal spontaneous vaginal delivery)     x 1  . Benign tumor of breast 1975    Review of Systems Per HPI    Objective:   Physical Exam  Nursing note and vitals reviewed. Constitutional: She appears well-developed and well-nourished. No distress.  Cardiovascular: Normal rate, regular rhythm, normal heart sounds and intact distal pulses.   No murmur heard. Pulmonary/Chest: Effort normal and breath sounds normal. No respiratory distress. She has no wheezes. She has no rales.  Musculoskeletal: She exhibits no edema.       Upper arm circumference - 27cm bilaterally Mild swelling noted anterior upper L arm. No pain to palpation today. No erythema/warmth. FROM at shoulders bilaterally and at elbows, wrists No bruit, no engorged veins evident bilateral arms.  Neurological: She has normal strength. No sensory deficit.  Reflex Scores:      Bicep reflexes are 2+ on the right side and 2+ on the left side.      5/5 strength BUE.       Assessment & Plan:

## 2012-01-08 ENCOUNTER — Telehealth: Payer: Self-pay

## 2012-01-08 ENCOUNTER — Encounter (INDEPENDENT_AMBULATORY_CARE_PROVIDER_SITE_OTHER): Payer: 59

## 2012-01-08 DIAGNOSIS — M79609 Pain in unspecified limb: Secondary | ICD-10-CM

## 2012-01-08 DIAGNOSIS — M79622 Pain in left upper arm: Secondary | ICD-10-CM

## 2012-01-08 NOTE — Telephone Encounter (Signed)
Pt seen 12/30/11; dull pain in arm about the same as when seen on 12/24; dull pain still on inside of upper and lower arm and pt request [place] ASAP; very concerned about blood clot. Pt request call back.

## 2012-01-08 NOTE — Telephone Encounter (Signed)
plz notify may schedule [place] to eval blood clot - placed order in chart.

## 2012-01-08 NOTE — Telephone Encounter (Signed)
Patient notified to expect call from [facility].

## 2012-03-23 ENCOUNTER — Encounter: Payer: Self-pay | Admitting: Family Medicine

## 2012-03-24 LAB — HM MAMMOGRAPHY: HM Mammogram: NORMAL

## 2012-03-25 ENCOUNTER — Encounter: Payer: Self-pay | Admitting: Family Medicine

## 2012-03-25 ENCOUNTER — Encounter: Payer: Self-pay | Admitting: *Deleted

## 2012-07-25 ENCOUNTER — Other Ambulatory Visit: Payer: Self-pay | Admitting: Family Medicine

## 2012-07-25 DIAGNOSIS — Z862 Personal history of diseases of the blood and blood-forming organs and certain disorders involving the immune mechanism: Secondary | ICD-10-CM

## 2012-07-25 DIAGNOSIS — E785 Hyperlipidemia, unspecified: Secondary | ICD-10-CM

## 2012-07-25 DIAGNOSIS — R030 Elevated blood-pressure reading, without diagnosis of hypertension: Secondary | ICD-10-CM

## 2012-07-26 ENCOUNTER — Other Ambulatory Visit: Payer: Self-pay | Admitting: Family Medicine

## 2012-07-28 ENCOUNTER — Other Ambulatory Visit (INDEPENDENT_AMBULATORY_CARE_PROVIDER_SITE_OTHER): Payer: 59

## 2012-07-28 DIAGNOSIS — Z0000 Encounter for general adult medical examination without abnormal findings: Secondary | ICD-10-CM

## 2012-07-28 DIAGNOSIS — E785 Hyperlipidemia, unspecified: Secondary | ICD-10-CM

## 2012-07-28 DIAGNOSIS — Z8639 Personal history of other endocrine, nutritional and metabolic disease: Secondary | ICD-10-CM

## 2012-07-28 DIAGNOSIS — Z862 Personal history of diseases of the blood and blood-forming organs and certain disorders involving the immune mechanism: Secondary | ICD-10-CM

## 2012-07-28 DIAGNOSIS — R030 Elevated blood-pressure reading, without diagnosis of hypertension: Secondary | ICD-10-CM

## 2012-07-28 LAB — BASIC METABOLIC PANEL
BUN: 14 mg/dL (ref 6–23)
CO2: 26 mEq/L (ref 19–32)
Glucose, Bld: 98 mg/dL (ref 70–99)
Potassium: 4 mEq/L (ref 3.5–5.1)
Sodium: 141 mEq/L (ref 135–145)

## 2012-07-28 LAB — LIPID PANEL: Cholesterol: 156 mg/dL (ref 0–200)

## 2012-08-04 ENCOUNTER — Encounter: Payer: Self-pay | Admitting: Family Medicine

## 2012-08-04 ENCOUNTER — Ambulatory Visit (INDEPENDENT_AMBULATORY_CARE_PROVIDER_SITE_OTHER): Payer: 59 | Admitting: Family Medicine

## 2012-08-04 VITALS — BP 112/78 | HR 80 | Temp 98.2°F | Ht 62.0 in | Wt 154.5 lb

## 2012-08-04 DIAGNOSIS — Z780 Asymptomatic menopausal state: Secondary | ICD-10-CM

## 2012-08-04 DIAGNOSIS — L408 Other psoriasis: Secondary | ICD-10-CM

## 2012-08-04 DIAGNOSIS — L409 Psoriasis, unspecified: Secondary | ICD-10-CM

## 2012-08-04 DIAGNOSIS — R3915 Urgency of urination: Secondary | ICD-10-CM

## 2012-08-04 DIAGNOSIS — Z0000 Encounter for general adult medical examination without abnormal findings: Secondary | ICD-10-CM

## 2012-08-04 DIAGNOSIS — E785 Hyperlipidemia, unspecified: Secondary | ICD-10-CM

## 2012-08-04 DIAGNOSIS — Z862 Personal history of diseases of the blood and blood-forming organs and certain disorders involving the immune mechanism: Secondary | ICD-10-CM

## 2012-08-04 LAB — POCT URINALYSIS DIPSTICK
Blood, UA: NEGATIVE
Glucose, UA: NEGATIVE
Nitrite, UA: NEGATIVE
Protein, UA: NEGATIVE
Spec Grav, UA: 1.015
Urobilinogen, UA: 0.2

## 2012-08-04 NOTE — Assessment & Plan Note (Signed)
Check UA today. rtc 1 mo with bladder diary to further discuss.

## 2012-08-04 NOTE — Assessment & Plan Note (Signed)
A1c 6.0 %  Discussed with patient, encouraged avoiding sweetts and added sugars.

## 2012-08-04 NOTE — Progress Notes (Addendum)
Subjective:    Patient ID: Marilyn Cook, female    DOB: June 25, 1945, 67 y.o.   MRN: [id number]  HPI CC: CPE  Mrs Bonser presents today for wellness exam.  Oh by the way...several month history of urinary urgency.   requests refill of kenalog spray for scalp psoriasis  Preventative:  Last physical was 1 yr ago  Colonoscopy (2009) diverticulosis, 2 mm sigmoid polyp, Juanda Chance). Rec rpt 7 yrs. Mammogram 03/25/2012 - initial abnormal, diagnostic mammo WNL, rec rpt 1 yr.  Dense tissue. Pap smear - bimanual only done 2 yrs ago. Partial hysterectomy around 1986. Thinks would like pelvic exam every few years. Tdap 07/2011 Pneumovax - 07/2011 allergic to eggs, avoids flu shot zostavax - told by pharmacist couldn't take shingles shot. Dexa scan - has had done years ago, told normal.  Looks like last done 2008, due for repeat.  1 fall in last year, none prior.  Denies depression/anhedonia, sadness.  Eye exam done 04/2012.  Having trouble with vision.  Told sight was 20/40 and 20/80  Medications and allergies reviewed and updated in chart.  Past histories reviewed and updated if relevant as below. Patient Active Problem List   Diagnosis Date Noted  . Left upper arm pain 12/30/2011  . Healthcare maintenance 07/31/2011  . Knee sprain and strain 07/31/2011  . Sleep disturbance 04/14/2011  . TENSION 04/18/2009  . ELEVATED BLOOD PRESSURE WITHOUT DIAGNOSIS OF HYPERTENSION 04/18/2009  . LEG PAIN, BILATERAL 10/23/2008  . PROBLEMS WITH SWALLOWING AND MASTICATION 10/23/2008  . COLONIC POLYPS 10/27/2007  . DIVERTICULOSIS OF COLON 10/27/2007  . LUMBAR STRAIN 07/07/2006  . HYPERLIPIDEMIA 06/30/2006  . ALLERGY, ENVIRONMENTAL 06/30/2006  . GLUCOSE INTOLERANCE, HX OF 06/30/2006   Past Medical History  Diagnosis Date  . Hyperlipemia   . NSVD (normal spontaneous vaginal delivery)     x 1  . Benign tumor of breast 1975   Past Surgical History  Procedure Laterality Date  . Partial hysterectomy   1987    due to fibroids  . R lat epicondyle injections  1998  . Spiral ct  04/02    (-)   History  Substance Use Topics  . Smoking status: Never Smoker   . Smokeless tobacco: Never Used  . Alcohol Use: No   Family History  Problem Relation Age of Onset  . Heart disease Father     MI   . Stroke Father     CVA  . Stroke Mother     CVA  . Hypertension Sister   . Cancer Sister     ovarian and colon cancer  . Aneurysm Brother   . Hypertension Brother   . Cancer Paternal Aunt     ? colon  . Cancer Paternal Grandfather     liver, alcohol  . Alcohol abuse Paternal Grandfather   . Hypertension Brother   . Cancer Other     prostate   Allergies  Allergen Reactions  . Aspirin   . Diphenhydramine Hcl   . Eggs Or Egg-Derived Products     REACTION: unspecified  . Epinephrine     REACTION: UNSPECIFIED  . Latex     REACTION: swelling  . Penicillins   . Sulfasalazine    Current Outpatient Prescriptions on File Prior to Visit  Medication Sig Dispense Refill  . acetaminophen (TYLENOL ARTHRITIS PAIN) 650 MG CR tablet as needed.        . Calcium Carb-Cholecalciferol (CALCIUM-VITAMIN D) 600-400 MG-UNIT TABS Take 1 tablet by mouth 3 (three) times  daily.      . Cinnamon 500 MG capsule Take 1,000 mg by mouth 2 (two) times daily.       [name] CRANBERRY PO Take by mouth.        . cyanocobalamin 100 MCG tablet Take 100 mcg by mouth daily.        . hydrocortisone 2.5 % cream Apply topically 2 (two) times daily as needed.        . loratadine (CLARITIN) 10 MG tablet Take 10 mg by mouth daily.       . Omega-3 Fatty Acids (FISH OIL PO) Take 3 g by mouth daily.       . Potassium Chloride (K-TABS PO) Take by mouth daily.       . pravastatin (PRAVACHOL) 40 MG tablet Take 40 mg by mouth at bedtime.      . pravastatin (PRAVACHOL) 40 MG tablet TAKE 1 TABLET BY MOUTH DAILY.  90 tablet  1  . triamcinolone (KENALOG) topical spray Apply topically 2 (two) times daily as needed. For psoriasis       .  VITAMIN E COMPLEX PO Take 1 capsule by mouth daily.      [name] co-enzyme Q-10 30 MG capsule Take 30 mg by mouth daily.      . Pyridoxine HCl (VITAMIN B-6 PO) Take 1 tablet by mouth daily.       No current facility-administered medications on file prior to visit.     Review of Systems  Constitutional: Negative for fever, chills, activity change, appetite change, fatigue and unexpected weight change.  HENT: Negative for hearing loss and neck pain.   Eyes: Negative for visual disturbance.  Respiratory: Negative for cough, chest tightness, shortness of breath and wheezing.   Cardiovascular: Negative for chest pain, palpitations and leg swelling.  Gastrointestinal: Negative for nausea, vomiting, abdominal pain, diarrhea, constipation, blood in stool and abdominal distention.  Genitourinary: Negative for hematuria and difficulty urinating.  Musculoskeletal: Negative for myalgias and arthralgias.  Skin: Negative for rash.  Neurological: Negative for dizziness, seizures, syncope and headaches.  Hematological: Negative for adenopathy. Does not bruise/bleed easily.  Psychiatric/Behavioral: Negative for dysphoric mood. The patient is not nervous/anxious.        Objective:   Physical Exam  Nursing note and vitals reviewed. Constitutional: She is oriented to person, place, and time. She appears well-developed and well-nourished. No distress.  HENT:  Head: Normocephalic and atraumatic.  Right Ear: External ear normal.  Left Ear: External ear normal.  Nose: Nose normal.  Mouth/Throat: Oropharynx is clear and moist. No oropharyngeal exudate.  Eyes: Conjunctivae and EOM are normal. Pupils are equal, round, and reactive to light. No scleral icterus.  Neck: Normal range of motion. Neck supple. Carotid bruit is not present. No thyromegaly present.  Cardiovascular: Normal rate, regular rhythm, normal heart sounds and intact distal pulses.   No murmur heard. Pulses:      Radial pulses are 2+ on the right  side, and 2+ on the left side.  Pulmonary/Chest: Effort normal and breath sounds normal. No respiratory distress. She has no wheezes. She has no rales. Right breast exhibits no inverted nipple, no mass, no nipple discharge, no skin change and no tenderness. Left breast exhibits no inverted nipple, no mass, no nipple discharge, no skin change and no tenderness.  Abdominal: Soft. Bowel sounds are normal. She exhibits no distension and no mass. There is no tenderness. There is no rebound and no guarding.  Genitourinary: Vagina normal. Pelvic exam was performed with  patient supine. There is no rash, tenderness, lesion or injury on the right labia. There is no rash, tenderness, lesion or injury on the left labia. Right adnexum displays no mass, no tenderness and no fullness. Left adnexum displays no mass, no tenderness and no fullness.  S/p hysterectomy  Musculoskeletal: Normal range of motion. She exhibits no edema.  Lymphadenopathy:    She has no cervical adenopathy.    She has no axillary adenopathy.       Right axillary: No lateral adenopathy present.       Left axillary: No lateral adenopathy present.      Right: No supraclavicular adenopathy present.       Left: No supraclavicular adenopathy present.  Neurological: She is alert and oriented to person, place, and time.  CN grossly intact, station and gait intact  Skin: Skin is warm and dry. No rash noted.  Psychiatric: She has a normal mood and affect. Her behavior is normal. Judgment and thought content normal.       Assessment & Plan:

## 2012-08-04 NOTE — Assessment & Plan Note (Signed)
Continue statin for now, add on coq10

## 2012-08-04 NOTE — Addendum Note (Signed)
Addended by: Sydell Axon C on: 08/04/2012 09:42 AM   Modules accepted: Orders

## 2012-08-04 NOTE — Assessment & Plan Note (Addendum)
Preventative protocols reviewed and updated unless pt declined. Discussed healthy diet and lifestyle.  Will look into shingles shot with egg allergy, should be ok.

## 2012-08-04 NOTE — Patient Instructions (Signed)
Pass by Marilyn Cook's office to schedule bone density scan. Breast and pelvic exam today. Return in 6 months for follow up, sooner if needed. Good to see you today, call us with questions

## 2012-08-05 ENCOUNTER — Encounter: Payer: Self-pay | Admitting: Family Medicine

## 2012-08-05 DIAGNOSIS — L409 Psoriasis, unspecified: Secondary | ICD-10-CM | POA: Insufficient documentation

## 2012-08-05 MED ORDER — TRIAMCINOLONE ACETONIDE 0.147 MG/GM EX AERS: INHALATION_SPRAY | Freq: Two times a day (BID) | CUTANEOUS | Status: DC | PRN

## 2012-08-05 NOTE — Addendum Note (Signed)
Addended by: Eustaquio Boyden on: 08/05/2012 06:23 AM   Modules accepted: Orders

## 2012-08-06 DIAGNOSIS — M8580 Other specified disorders of bone density and structure, unspecified site: Secondary | ICD-10-CM

## 2012-08-06 HISTORY — DX: Other specified disorders of bone density and structure, unspecified site: M85.80

## 2012-08-06 HISTORY — PX: OTHER SURGICAL HISTORY: SHX169

## 2012-08-06 LAB — HM DEXA SCAN

## 2012-08-09 ENCOUNTER — Encounter: Payer: Self-pay | Admitting: Family Medicine

## 2012-08-09 ENCOUNTER — Ambulatory Visit (HOSPITAL_COMMUNITY)
Admission: RE | Admit: 2012-08-09 | Discharge: 2012-08-09 | Disposition: A | Discharge status: Home / Self Care | Payer: 59 | Source: Ambulatory Visit | Attending: Family Medicine | Admitting: Family Medicine

## 2012-08-09 DIAGNOSIS — Z13820 Encounter for screening for osteoporosis: Secondary | ICD-10-CM | POA: Insufficient documentation

## 2012-08-09 DIAGNOSIS — Z780 Asymptomatic menopausal state: Secondary | ICD-10-CM | POA: Insufficient documentation

## 2012-08-10 ENCOUNTER — Encounter: Payer: Self-pay | Admitting: *Deleted

## 2013-01-03 ENCOUNTER — Other Ambulatory Visit: Payer: Self-pay | Admitting: Family Medicine

## 2013-02-04 ENCOUNTER — Ambulatory Visit (INDEPENDENT_AMBULATORY_CARE_PROVIDER_SITE_OTHER): Payer: 59 | Admitting: Family Medicine

## 2013-02-04 ENCOUNTER — Encounter: Payer: Self-pay | Admitting: Family Medicine

## 2013-02-04 VITALS — BP 128/82 | HR 84 | Temp 98.7°F | Wt 150.5 lb

## 2013-02-04 DIAGNOSIS — R3915 Urgency of urination: Secondary | ICD-10-CM

## 2013-02-04 DIAGNOSIS — T7840XA Allergy, unspecified, initial encounter: Secondary | ICD-10-CM

## 2013-02-04 DIAGNOSIS — E785 Hyperlipidemia, unspecified: Secondary | ICD-10-CM

## 2013-02-04 NOTE — Assessment & Plan Note (Signed)
Reviewed latest FLP - will recommend she decrease fish oil to 2 gm daily.

## 2013-02-04 NOTE — Progress Notes (Signed)
   Subjective:    Patient ID: [name], female    DOB: Nov 25, 1945, 68 y.o.   MRN: [id number]  HPI CC: 6 mo f/u  Allergies acting up this week after she got into dust.  Increased cough recently.  No significant urinary issue currently.  She does drink lots of fluids - especially iced tea.  Lab Results  Component Value Date   TSH 3.10 07/28/2012  Endorses skin, hair and nail changes.  Biotin hasn't helped.  Osteopenia - takes calcium /vit D three times daily.   Past Medical History  Diagnosis Date  . Hyperlipemia   . NSVD (normal spontaneous vaginal delivery)     x 1  . Benign tumor of breast 1975  . Scalp psoriasis   . Osteopenia 08/2012    spine WNL, hip T -1.2    Review of Systems Per HPI    Objective:   Physical Exam  Nursing note and vitals reviewed. Constitutional: She appears well-developed and well-nourished. No distress.  HENT:  Mouth/Throat: Oropharynx is clear and moist. No oropharyngeal exudate.  Cardiovascular: Normal rate, regular rhythm, normal heart sounds and intact distal pulses.   No murmur heard. Pulmonary/Chest: Effort normal and breath sounds normal. No respiratory distress. She has no wheezes. She has no rales.  Musculoskeletal: She exhibits no edema.  Skin: Skin is warm and dry. No rash noted.  Psychiatric: She has a normal mood and affect.       Assessment & Plan:

## 2013-02-04 NOTE — Addendum Note (Signed)
Addended by: Ria Bush on: 02/04/2013 08:27 AM   Modules accepted: Level of Service

## 2013-02-04 NOTE — Assessment & Plan Note (Addendum)
Pt denies current sxs with this. Encouraged decreased caffeine.

## 2013-02-04 NOTE — Patient Instructions (Addendum)
Let's decrease fish oil to 2 tablets daily. Return in 6-8 months for wellness exam. Good to see you today, call us with questions.

## 2013-02-04 NOTE — Assessment & Plan Note (Signed)
Overall stable today.

## 2013-02-04 NOTE — Progress Notes (Signed)
Pre-visit discussion using our clinic review tool. No additional management support is needed unless otherwise documented below in the visit note.  

## 2013-02-14 ENCOUNTER — Ambulatory Visit (INDEPENDENT_AMBULATORY_CARE_PROVIDER_SITE_OTHER): Payer: 59 | Admitting: Family Medicine

## 2013-02-14 ENCOUNTER — Encounter: Payer: Self-pay | Admitting: Family Medicine

## 2013-02-14 VITALS — BP 136/78 | HR 80 | Temp 98.1°F | Wt 150.5 lb

## 2013-02-14 DIAGNOSIS — J310 Chronic rhinitis: Secondary | ICD-10-CM

## 2013-02-14 HISTORY — DX: Chronic rhinitis: J31.0

## 2013-02-14 MED ORDER — AZELASTINE HCL 0.1 % NA SOLN: 1.0000 | Freq: Two times a day (BID) | NASAL | Status: DC

## 2013-02-14 NOTE — Patient Instructions (Signed)
I think you may have non allergic rhinitis - let's start astelin nasal spray twice daily as needed Let's switch from claritin to zyrtec. Let me know if not improving.

## 2013-02-14 NOTE — Assessment & Plan Note (Signed)
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 Will switch from claritin to zyrtec (may has lost effect.) Will start astelin nasal spray Update if sxs persist or fail to improve.

## 2013-02-14 NOTE — Progress Notes (Signed)
Pre-visit discussion using our clinic review tool. No additional management support is needed unless otherwise documented below in the visit note.  

## 2013-02-14 NOTE — Progress Notes (Signed)
BP 136/78  Pulse 80  Temp(Src) 98.1 F (36.7 C) (Oral)  Wt 150 lb 8 oz (68.266 kg)   CC: not getting better  Subjective:    Patient ID: Marilyn Cook, female    DOB: Feb 13, 1945, 68 y.o.   MRN: [id number]  HPI: Marilyn Cook is a 68 y.o. female presenting on 02/14/2013 with URI   Seen here 02/04/2013 with "Allergies acting up" for a week after she got into dust. Increased cough as well.  Not better. Clear runny nose alternating with nasal congestion present.  Sputum is all clear.  Having some L sided upper back pain ?from cough. Notices nasal congestion worse with meals, congestion is also positional.  No fevers/chills, headaches, ear or tooth pain. Taking claritin daily for last several years.  Relevant past medical, surgical, family and social history reviewed and updated. Allergies and medications reviewed and updated. Current Outpatient Prescriptions on File Prior to Visit  Medication Sig  . acetaminophen (TYLENOL ARTHRITIS PAIN) 650 MG CR tablet as needed.    . Calcium Carb-Cholecalciferol (CALCIUM-VITAMIN D) 600-400 MG-UNIT TABS Take 1 tablet by mouth 3 (three) times daily.  . Cinnamon 500 MG capsule Take 1,000 mg by mouth 2 (two) times daily.   [name] co-enzyme Q-10 30 MG capsule Take 30 mg by mouth daily.  [name] CRANBERRY PO Take by mouth.    . cyanocobalamin 100 MCG tablet Take 100 mcg by mouth daily.    . hydrocortisone 2.5 % cream Apply topically 2 (two) times daily as needed.    . Misc Natural Products (BLACK CHERRY CONCENTRATE PO) Take by mouth daily.  . Omega-3 Fatty Acids (FISH OIL PO) Take 2 g by mouth daily.   . Potassium Chloride (K-TABS PO) Take by mouth daily.   . pravastatin (PRAVACHOL) 40 MG tablet TAKE 1 TABLET BY MOUTH ONCE NIGHTLY  . Pyridoxine HCl (VITAMIN B-6 PO) Take 1 tablet by mouth daily.  [name] triamcinolone (KENALOG) topical spray Apply topically 2 (two) times daily as needed. For psoriasis  . VITAMIN E COMPLEX PO Take 1 capsule by mouth daily.   No current  facility-administered medications on file prior to visit.    Review of Systems Per HPI unless specifically indicated above    Objective:    BP 136/78  Pulse 80  Temp(Src) 98.1 F (36.7 C) (Oral)  Wt 150 lb 8 oz (68.266 kg)  Physical Exam  Nursing note and vitals reviewed. Constitutional: She appears well-developed and well-nourished. No distress.  HENT:  Head: Normocephalic and atraumatic.  Right Ear: Hearing, tympanic membrane, external ear and ear canal normal.  Left Ear: Hearing, tympanic membrane, external ear and ear canal normal.  Nose: Mucosal edema (pale boggy turbinates) and rhinorrhea present. Right sinus exhibits no maxillary sinus tenderness and no frontal sinus tenderness. Left sinus exhibits no maxillary sinus tenderness and no frontal sinus tenderness.  Mouth/Throat: Uvula is midline, oropharynx is clear and moist and mucous membranes are normal. No oropharyngeal exudate, posterior oropharyngeal edema, posterior oropharyngeal erythema or tonsillar abscesses.  Eyes: Conjunctivae and EOM are normal. Pupils are equal, round, and reactive to light. No scleral icterus.  Neck: Normal range of motion. Neck supple.  Cardiovascular: Normal rate, regular rhythm, normal heart sounds and intact distal pulses.   No murmur heard. Pulmonary/Chest: Effort normal and breath sounds normal. No respiratory distress. She has no wheezes. She has no rales.  Lymphadenopathy:    She has no cervical adenopathy.  Skin: Skin is warm and dry. No rash  noted.       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 Will switch from claritin to zyrtec (may has lost effect.) Will start astelin nasal spray Update if sxs persist or fail to improve.        Follow up plan: Return if symptoms worsen or fail to improve.

## 2013-03-28 ENCOUNTER — Encounter: Payer: Self-pay | Admitting: Family Medicine

## 2013-03-28 ENCOUNTER — Ambulatory Visit (INDEPENDENT_AMBULATORY_CARE_PROVIDER_SITE_OTHER): Payer: 59 | Admitting: Family Medicine

## 2013-03-28 VITALS — BP 122/70 | HR 80 | Temp 98.1°F | Wt 148.5 lb

## 2013-03-28 DIAGNOSIS — R002 Palpitations: Secondary | ICD-10-CM

## 2013-03-28 HISTORY — DX: Palpitations: R00.2

## 2013-03-28 NOTE — Patient Instructions (Signed)
I think you had benign extra heart beats.  Try to avoid caffeine and other stimulants. If not improving let us know for referral to heart doctor for holter monitor. Good to see you today, call us with questions.

## 2013-03-28 NOTE — Progress Notes (Signed)
Pre visit review using our clinic review tool, if applicable. No additional management support is needed unless otherwise documented below in the visit note. 

## 2013-03-28 NOTE — Assessment & Plan Note (Signed)
Brings EKG strip showing frequent benign PVCs - encouraged she avoid caffeine and other stimulants. Check CBC, TSH, BMP today. If persistent, will refer to cards for holter monitor Pt agrees with plan.

## 2013-03-28 NOTE — Progress Notes (Signed)
BP 122/70  Pulse 80  Temp(Src) 98.1 F (36.7 C) (Oral)  Wt 148 lb 8 oz (67.359 kg)  SpO2 96%   CC: heart fluttering Subjective:    Patient ID: Marilyn Cook, female    DOB: Aug 04, 1945, 68 y.o.   MRN: [id number]  HPI: Marilyn Cook is a 68 y.o. female presenting on 03/28/2013 for heart flutters   2d ago (saturday) attended family member's funeral.  Later that night while sitting watching TV felt heart start fluttering.  Has felt this intermittently over last week.  Went to local [place] where EKG was normal.  Denies chest pain, headaches, lightheadedness, or shortness of breath.  Caffeine free diet cokes.  Denies sudophed or other stimulant use.  Brings strip from EMS showing occasional benign PVCs, asked to scan.  Mixed rhinitis improved on astelin and zyrtec.  Feeling sleepy during the day.  Relevant past medical, surgical, family and social history reviewed and updated as indicated.  Allergies and medications reviewed and updated. Current Outpatient Prescriptions on File Prior to Visit  Medication Sig  . acetaminophen (TYLENOL ARTHRITIS PAIN) 650 MG CR tablet as needed.    [name] azelastine (ASTELIN) 137 MCG/SPRAY nasal spray Place 1-2 sprays into both nostrils 2 (two) times daily. Use in each nostril as directed  . Calcium Carb-Cholecalciferol (CALCIUM-VITAMIN D) 600-400 MG-UNIT TABS Take 1 tablet by mouth 3 (three) times daily.  . cetirizine (ZYRTEC) 10 MG tablet Take 10 mg by mouth daily.  . Cinnamon 500 MG capsule Take 1,000 mg by mouth 2 (two) times daily.   [name] co-enzyme Q-10 30 MG capsule Take 30 mg by mouth daily.  [name] CRANBERRY PO Take by mouth.    . cyanocobalamin 100 MCG tablet Take 100 mcg by mouth daily.    . hydrocortisone 2.5 % cream Apply topically 2 (two) times daily as needed.    . Misc Natural Products (BLACK CHERRY CONCENTRATE PO) Take by mouth daily.  . Omega-3 Fatty Acids (FISH OIL PO) Take 2 g by mouth daily.   . Potassium Chloride (K-TABS PO) Take by mouth  daily.   . pravastatin (PRAVACHOL) 40 MG tablet TAKE 1 TABLET BY MOUTH ONCE NIGHTLY  . Pyridoxine HCl (VITAMIN B-6 PO) Take 1 tablet by mouth daily.  [name] triamcinolone (KENALOG) topical spray Apply topically 2 (two) times daily as needed. For psoriasis  . VITAMIN E COMPLEX PO Take 1 capsule by mouth daily.   No current facility-administered medications on file prior to visit.    Review of Systems Per HPI unless specifically indicated above    Objective:    BP 122/70  Pulse 80  Temp(Src) 98.1 F (36.7 C) (Oral)  Wt 148 lb 8 oz (67.359 kg)  SpO2 96%  Physical Exam  Nursing note and vitals reviewed. Constitutional: She appears well-developed and well-nourished. No distress.  HENT:  Head: Normocephalic and atraumatic.  Mouth/Throat: Oropharynx is clear and moist. No oropharyngeal exudate.  Eyes: Conjunctivae and EOM are normal. Pupils are equal, round, and reactive to light. No scleral icterus.  Neck: No thyromegaly present.  Cardiovascular: Normal rate, regular rhythm, normal heart sounds and intact distal pulses.   No murmur heard. Pulmonary/Chest: Effort normal and breath sounds normal. No respiratory distress. She has no wheezes. She has no rales.  Musculoskeletal: She exhibits no edema.  Skin: Skin is warm and dry. No rash noted.  Psychiatric: She has a normal mood and affect.       Assessment & Plan:   Problem List  Items Addressed This Visit   Palpitations - Primary     Brings EKG strip showing frequent benign PVCs - encouraged she avoid caffeine and other stimulants. Check CBC, TSH, BMP today. If persistent, will refer to cards for holter monitor Pt agrees with plan.    Relevant Orders      TSH      CBC with Differential      Basic metabolic panel       Follow up plan: Return if symptoms worsen or fail to improve.

## 2013-03-29 ENCOUNTER — Encounter: Payer: Self-pay | Admitting: Family Medicine

## 2013-03-29 ENCOUNTER — Encounter: Payer: Self-pay | Admitting: *Deleted

## 2013-03-29 LAB — CBC WITH DIFFERENTIAL/PLATELET
Basophils Absolute: 0 10*3/uL (ref 0.0–0.1)
Basophils Relative: 0.2 % (ref 0.0–3.0)
EOS ABS: 0.1 10*3/uL (ref 0.0–0.7)
Eosinophils Relative: 1.3 % (ref 0.0–5.0)
HCT: 37.5 % (ref 36.0–46.0)
Hemoglobin: 12.8 g/dL (ref 12.0–15.0)
Lymphocytes Relative: 29.2 % (ref 12.0–46.0)
Lymphs Abs: 1.6 10*3/uL (ref 0.7–4.0)
MCHC: 34.1 g/dL (ref 30.0–36.0)
MCV: 97.9 fl (ref 78.0–100.0)
MONOS PCT: 9.9 % (ref 3.0–12.0)
Monocytes Absolute: 0.5 10*3/uL (ref 0.1–1.0)
NEUTROS ABS: 3.2 10*3/uL (ref 1.4–7.7)
Neutrophils Relative %: 59.4 % (ref 43.0–77.0)
Platelets: 211 10*3/uL (ref 150.0–400.0)
RBC: 3.83 Mil/uL — ABNORMAL LOW (ref 3.87–5.11)
RDW: 13.2 % (ref 11.5–14.6)
WBC: 5.4 10*3/uL (ref 4.5–10.5)

## 2013-03-29 LAB — BASIC METABOLIC PANEL
BUN: 11 mg/dL (ref 6–23)
CHLORIDE: 103 meq/L (ref 96–112)
CO2: 28 meq/L (ref 19–32)
CREATININE: 0.8 mg/dL (ref 0.4–1.2)
Calcium: 9.1 mg/dL (ref 8.4–10.5)
GFR: 76.81 mL/min (ref >60.00)
GLUCOSE: 81 mg/dL (ref 70–99)
Potassium: 3.9 mEq/L (ref 3.5–5.1)
Sodium: 138 mEq/L (ref 135–145)

## 2013-03-29 LAB — TSH: TSH: 3.33 u[IU]/mL (ref 0.35–5.50)

## 2013-03-30 ENCOUNTER — Encounter: Payer: Self-pay | Admitting: Family Medicine

## 2013-06-25 ENCOUNTER — Telehealth (HOSPITAL_COMMUNITY): Payer: Self-pay

## 2013-06-25 ENCOUNTER — Emergency Department (HOSPITAL_COMMUNITY)
Admission: EM | Admit: 2013-06-25 | Discharge: 2013-06-25 | Disposition: A | Discharge status: Home / Self Care | Payer: 59 | Source: Home / Self Care | Attending: Family Medicine | Admitting: Family Medicine

## 2013-06-25 ENCOUNTER — Encounter (HOSPITAL_COMMUNITY): Payer: Self-pay | Admitting: Emergency Medicine

## 2013-06-25 DIAGNOSIS — IMO0001 Reserved for inherently not codable concepts without codable children: Secondary | ICD-10-CM

## 2013-06-25 DIAGNOSIS — T63481A Toxic effect of venom of other arthropod, accidental (unintentional), initial encounter: Secondary | ICD-10-CM

## 2013-06-25 DIAGNOSIS — T6391XA Toxic effect of contact with unspecified venomous animal, accidental (unintentional), initial encounter: Secondary | ICD-10-CM

## 2013-06-25 MED ORDER — DESOXIMETASONE 0.05 % EX GEL: 1.0000 "application " | Freq: Two times a day (BID) | CUTANEOUS | Status: DC

## 2013-06-25 NOTE — ED Provider Notes (Signed)
CSN: [id number]     Arrival date & time 06/25/13  1015 History   First MD Initiated Contact with Patient 06/25/13 1154     Chief Complaint  Patient presents with  . Hand Problem   (Consider location/radiation/quality/duration/timing/severity/associated sxs/prior Treatment) Patient is a 68 y.o. female presenting with rash. The history is provided by the patient.  Rash Location:  Hand Hand rash location:  Dorsum of R hand Quality: itchiness, redness and swelling   Severity:  Mild Onset quality:  Sudden Duration:  2 days Progression:  Spreading Chronicity:  New Context: insect bite/sting   Context comment:  Onset working in garden on thurs. Associated symptoms: no fever, no shortness of breath, no throat swelling and no tongue swelling     Past Medical History  Diagnosis Date  . Hyperlipemia   . NSVD (normal spontaneous vaginal delivery)     x 1  . Benign tumor of breast 1975  . Scalp psoriasis   . Osteopenia 08/2012    spine WNL, hip T -1.2   Past Surgical History  Procedure Laterality Date  . Partial hysterectomy  1987    due to fibroids  . R lat epicondyle injections  1998  . Spiral ct  04/02    (-)  . Dexa  08/2012    spine WNL, hip T -1.2   Family History  Problem Relation Age of Onset  . Heart disease Father     MI   . Stroke Father     CVA  . Stroke Mother     CVA  . Hypertension Sister   . Cancer Sister     ovarian and colon cancer  . Aneurysm Brother   . Hypertension Brother   . Cancer Paternal Aunt     ? colon  . Cancer Paternal Grandfather     liver, alcohol  . Alcohol abuse Paternal Grandfather   . Hypertension Brother   . Cancer Other     prostate   History  Substance Use Topics  . Smoking status: Never Smoker   . Smokeless tobacco: Never Used  . Alcohol Use: No   OB History   Grav Para Term Preterm Abortions TAB SAB Ect Mult Living                 Review of Systems  Constitutional: Negative.  Negative for fever.  Respiratory:  Negative for shortness of breath.   Musculoskeletal: Negative.   Skin: Positive for rash.    Allergies  Aspirin; Diphenhydramine hcl; Eggs or egg-derived products; Epinephrine; Latex; Penicillins; and Sulfasalazine  Home Medications   Prior to Admission medications   Medication Sig Start Date End Date Taking? Authorizing Provider  Calcium Carb-Cholecalciferol (CALCIUM-VITAMIN D) 600-400 MG-UNIT TABS Take 1 tablet by mouth 3 (three) times daily.   Yes Historical Provider, MD  Potassium Chloride (K-TABS PO) Take by mouth daily.    Yes Historical Provider, MD  pravastatin (PRAVACHOL) 40 MG tablet TAKE 1 TABLET BY MOUTH ONCE NIGHTLY 01/03/13  Yes Ria Bush, MD  acetaminophen (TYLENOL ARTHRITIS PAIN) 650 MG CR tablet as needed.      Historical Provider, MD  azelastine (ASTELIN) 137 MCG/SPRAY nasal spray Place 1-2 sprays into both nostrils 2 (two) times daily. Use in each nostril as directed 02/14/13   Ria Bush, MD  cetirizine (ZYRTEC) 10 MG tablet Take 10 mg by mouth daily.    Historical Provider, MD  Cinnamon 500 MG capsule Take 1,000 mg by mouth 2 (two) times daily.  Historical Provider, MD  co-enzyme Q-10 30 MG capsule Take 30 mg by mouth daily.    Historical Provider, MD  CRANBERRY PO Take by mouth.      Historical Provider, MD  cyanocobalamin 100 MCG tablet Take 100 mcg by mouth daily.      Historical Provider, MD  Desoximetasone 0.05 % GEL Apply 1 application topically 2 (two) times daily. 06/25/13   Billy Fischer, MD  hydrocortisone 2.5 % cream Apply topically 2 (two) times daily as needed.      Historical Provider, MD  Misc Natural Products (BLACK CHERRY CONCENTRATE PO) Take by mouth daily.    Historical Provider, MD  Omega-3 Fatty Acids (FISH OIL PO) Take 2 g by mouth daily.     Historical Provider, MD  Pyridoxine HCl (VITAMIN B-6 PO) Take 1 tablet by mouth daily.    Historical Provider, MD  triamcinolone (KENALOG) topical spray Apply topically 2 (two) times daily as  needed. For psoriasis 08/05/12   Ria Bush, MD  VITAMIN E COMPLEX PO Take 1 capsule by mouth daily.    Historical Provider, MD   BP 160/84  Pulse 68  Temp(Src) 97.8 F (36.6 C) (Oral)  Resp 17  SpO2 97% Physical Exam  Nursing note and vitals reviewed. Constitutional: She appears well-developed and well-nourished.  Skin: Skin is warm and dry. Rash noted. There is erythema.       ED Course  Procedures (including critical care time) Labs Review Labs Reviewed - No data to display  Imaging Review No results found.   MDM   1. Local reaction to insect sting, accidental or unintentional, initial encounter        Billy Fischer, MD 06/25/13 1212

## 2013-06-25 NOTE — ED Notes (Signed)
Pharmacy called requesting change in medication.  Pharmacist states gel is not covered by insurance but cream is.  Chart reviewed by Dr Juventino Slovak he approved switch.

## 2013-06-25 NOTE — Discharge Instructions (Signed)
Ice pack and medicine twice a day, return if further problems.

## 2013-06-25 NOTE — ED Notes (Signed)
Pt c/o right hand swelling onset Thursday Reports she felt a sting; did not see the insect Sx include: redness, itchiness, localized fever Alert w/no signs of acute distress.

## 2013-07-18 ENCOUNTER — Other Ambulatory Visit: Payer: Self-pay | Admitting: Family Medicine

## 2013-07-30 ENCOUNTER — Other Ambulatory Visit: Payer: Self-pay | Admitting: Family Medicine

## 2013-07-30 DIAGNOSIS — E785 Hyperlipidemia, unspecified: Secondary | ICD-10-CM

## 2013-07-30 DIAGNOSIS — R7303 Prediabetes: Secondary | ICD-10-CM

## 2013-08-01 ENCOUNTER — Other Ambulatory Visit (INDEPENDENT_AMBULATORY_CARE_PROVIDER_SITE_OTHER): Payer: 59

## 2013-08-01 DIAGNOSIS — R7303 Prediabetes: Secondary | ICD-10-CM

## 2013-08-01 DIAGNOSIS — E785 Hyperlipidemia, unspecified: Secondary | ICD-10-CM

## 2013-08-01 DIAGNOSIS — R7309 Other abnormal glucose: Secondary | ICD-10-CM

## 2013-08-01 LAB — LIPID PANEL
CHOL/HDL RATIO: 3
Cholesterol: 149 mg/dL (ref 0–200)
HDL: 47.8 mg/dL (ref >39.00)
LDL CALC: 89 mg/dL (ref 0–99)
NONHDL: 101.2
Triglycerides: 62 mg/dL (ref 0.0–149.0)
VLDL: 12.4 mg/dL (ref 0.0–40.0)

## 2013-08-01 LAB — HEMOGLOBIN A1C: HEMOGLOBIN A1C: 5.8 % (ref 4.6–6.5)

## 2013-08-04 ENCOUNTER — Ambulatory Visit (INDEPENDENT_AMBULATORY_CARE_PROVIDER_SITE_OTHER): Payer: 59 | Admitting: Family Medicine

## 2013-08-04 ENCOUNTER — Encounter: Payer: Self-pay | Admitting: Family Medicine

## 2013-08-04 VITALS — BP 136/82 | HR 68 | Temp 98.0°F | Ht 62.0 in | Wt 143.2 lb

## 2013-08-04 DIAGNOSIS — Z0000 Encounter for general adult medical examination without abnormal findings: Secondary | ICD-10-CM

## 2013-08-04 DIAGNOSIS — R7303 Prediabetes: Secondary | ICD-10-CM

## 2013-08-04 DIAGNOSIS — R7309 Other abnormal glucose: Secondary | ICD-10-CM

## 2013-08-04 DIAGNOSIS — E785 Hyperlipidemia, unspecified: Secondary | ICD-10-CM

## 2013-08-04 NOTE — Progress Notes (Signed)
BP 136/82  Pulse 68  Temp(Src) 98 F (36.7 C) (Oral)  Ht 5\' 2"  (1.575 m)  Wt 143 lb 4 oz (64.978 kg)  BMI 26.19 kg/m2   CC: CPE  Subjective:    Patient ID: Marilyn Cook, female    DOB: 1945-10-13, 68 y.o.   MRN: [id number]  HPI: Marilyn Cook is a 68 y.o. female presenting on 08/04/2013 for Annual Exam   Wt Readings from Last 3 Encounters:  08/04/13 143 lb 4 oz (64.978 kg)  03/28/13 148 lb 8 oz (67.359 kg)  02/14/13 150 lb 8 oz (68.266 kg)   Body mass index is 26.19 kg/(m^2). weight loss noted - not trying.  No recent vision exam. Advised schedule.  Seat belt use discussed Sunscreen use discussed - allergic to sunscreen. Discussed avoiding sun and wearing wide brimmed hat  Preventative: Colonoscopy (2009) diverticulosis, 2 mm sigmoid polyp, Olevia Perches). Rec rpt 7 yrs.  Mammogram 03/2013 - normal, rec rpt 1 yr  Pap smear - bimanual only done last year. Partial hysterectomy for benign reason (fibroids), ovaries remain.  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  Pneumovax 07/31/2011 Advanced directives: discussed - HCPOA unsure will consider this. Needs to set up.  Married and lives with husband One son Occupation: works at [employer] at [employer] Activity: no regular exercise Diet: some water, fruits/vegetables daily  Relevant past medical, surgical, family and social history reviewed and updated as indicated.  Allergies and medications reviewed and updated. Current Outpatient Prescriptions on File Prior to Visit  Medication Sig  . acetaminophen (TYLENOL ARTHRITIS PAIN) 650 MG CR tablet as needed.    [name] azelastine (ASTELIN) 137 MCG/SPRAY nasal spray Place 1-2 sprays into both nostrils 2 (two) times daily. Use in each nostril as directed  . Calcium Carb-Cholecalciferol (CALCIUM-VITAMIN D) 600-400 MG-UNIT TABS Take 1 tablet by mouth 3 (three) times daily.  . cetirizine (ZYRTEC) 10 MG tablet Take 10 mg by mouth daily.  . Cinnamon 500 MG capsule  Take 1,000 mg by mouth 2 (two) times daily.   [name] co-enzyme Q-10 30 MG capsule Take 30 mg by mouth daily.  [name] CRANBERRY PO Take by mouth.    . cyanocobalamin 100 MCG tablet Take 100 mcg by mouth daily.    . Desoximetasone 0.05 % GEL Apply 1 application topically 2 (two) times daily.  . hydrocortisone 2.5 % cream Apply topically 2 (two) times daily as needed.    . Misc Natural Products (BLACK CHERRY CONCENTRATE PO) Take by mouth daily.  . Omega-3 Fatty Acids (FISH OIL PO) Take 2 g by mouth daily.   . Potassium Chloride (K-TABS PO) Take by mouth daily.   . pravastatin (PRAVACHOL) 40 MG tablet Take one tablet once daily.  . Pyridoxine HCl (VITAMIN B-6 PO) Take 1 tablet by mouth daily.  [name] triamcinolone (KENALOG) topical spray Apply topically 2 (two) times daily as needed. For psoriasis  . VITAMIN E COMPLEX PO Take 1 capsule by mouth daily.   No current facility-administered medications on file prior to visit.    Review of Systems  Constitutional: Positive for unexpected weight change. Negative for fever, chills, activity change, appetite change and fatigue.  HENT: Negative for hearing loss.   Eyes: Negative for visual disturbance.  Respiratory: Negative for cough, chest tightness, shortness of breath and wheezing.   Cardiovascular: Negative for chest pain, palpitations and leg swelling.  Gastrointestinal: Negative for nausea, vomiting, abdominal pain, diarrhea, constipation, blood in  stool and abdominal distention.       No early satiety or dysphagia  Genitourinary: Negative for hematuria and difficulty urinating.  Musculoskeletal: Negative for arthralgias, myalgias and neck pain.  Skin: Negative for rash.  Neurological: Negative for dizziness, seizures, syncope and headaches.  Hematological: Negative for adenopathy. Does not bruise/bleed easily.  Psychiatric/Behavioral: Negative for dysphoric mood. The patient is not nervous/anxious.    Per HPI unless specifically indicated above      Objective:    BP 136/82  Pulse 68  Temp(Src) 98 F (36.7 C) (Oral)  Ht 5\' 2"  (1.575 m)  Wt 143 lb 4 oz (64.978 kg)  BMI 26.19 kg/m2  Physical Exam  Nursing note and vitals reviewed. Constitutional: She is oriented to person, place, and time. She appears well-developed and well-nourished. No distress.  HENT:  Head: Normocephalic and atraumatic.  Right Ear: Hearing, tympanic membrane, external ear and ear canal normal.  Left Ear: Hearing, tympanic membrane, external ear and ear canal normal.  Nose: Nose normal.  Mouth/Throat: Uvula is midline, oropharynx is clear and moist and mucous membranes are normal. No oropharyngeal exudate, posterior oropharyngeal edema or posterior oropharyngeal erythema.  Eyes: Conjunctivae and EOM are normal. Pupils are equal, round, and reactive to light. No scleral icterus.  Neck: Normal range of motion. Neck supple. Carotid bruit is not present. No thyromegaly present.  Cardiovascular: Normal rate, regular rhythm, normal heart sounds and intact distal pulses.   No murmur heard. Pulses:      Radial pulses are 2+ on the right side, and 2+ on the left side.  Pulmonary/Chest: Effort normal and breath sounds normal. No respiratory distress. She has no wheezes. She has no rales. Right breast exhibits no inverted nipple, no mass, no nipple discharge, no skin change and no tenderness. Left breast exhibits no inverted nipple, no mass, no nipple discharge, no skin change and no tenderness.  Abdominal: Soft. Bowel sounds are normal. She exhibits no distension and no mass. There is no tenderness. There is no rebound and no guarding.  Genitourinary: Vagina normal. Pelvic exam was performed with patient supine. There is no rash, tenderness, lesion or injury on the right labia. There is no rash, tenderness, lesion or injury on the left labia. Right adnexum displays no mass, no tenderness and no fullness. Left adnexum displays no mass, no tenderness and no fullness.  Uterus  absent  Musculoskeletal: Normal range of motion. She exhibits no edema.  Lymphadenopathy:       Head (right side): No submental, no submandibular, no tonsillar, no preauricular and no posterior auricular adenopathy present.       Head (left side): No submental, no submandibular, no tonsillar, no preauricular and no posterior auricular adenopathy present.    She has no cervical adenopathy.    She has no axillary adenopathy.       Right axillary: No lateral adenopathy present.       Left axillary: No lateral adenopathy present.      Right: No supraclavicular adenopathy present.       Left: No supraclavicular adenopathy present.  Neurological: She is alert and oriented to person, place, and time.  CN grossly intact, station and gait intact  Skin: Skin is warm and dry. No rash noted.  Psychiatric: She has a normal mood and affect. Her behavior is normal. Judgment and thought content normal.   Results for orders placed in visit on 08/01/13  LIPID PANEL      Result Value Ref Range   Cholesterol  149  0 - 200 mg/dL   Triglycerides 62.0  0.0 - 149.0 mg/dL   HDL 47.80  >39.00 mg/dL   VLDL 12.4  0.0 - 40.0 mg/dL   LDL Cholesterol 89  0 - 99 mg/dL   Total CHOL/HDL Ratio 3     NonHDL 101.20    HEMOGLOBIN A1C      Result Value Ref Range   Hemoglobin A1C 5.8  4.6 - 6.5 %      Assessment & Plan:   Problem List Items Addressed This Visit   Prediabetes     A1c 5.8% reviewed with patient.     HYPERLIPIDEMIA     Great control on current pravastatin.    Healthcare maintenance - Primary     Preventative protocols reviewed and updated unless pt declined. Discussed healthy diet and lifestyle.         Follow up plan: No Follow-up on file.

## 2013-08-04 NOTE — Assessment & Plan Note (Signed)
Great control on current pravastatin.

## 2013-08-04 NOTE — Patient Instructions (Addendum)
Advanced directive handouts provided today. Good to see you today, call us with quesitons.  Health Maintenance Adopting a healthy lifestyle and getting preventive care can go a long way to promote health and wellness. Talk with your health care provider about what schedule of regular examinations is right for you. This is a good chance for you to check in with your provider about disease prevention and staying healthy. In between checkups, there are plenty of things you can do on your own. Experts have done a lot of research about which lifestyle changes and preventive measures are most likely to keep you healthy. Ask your health care provider for more information. WEIGHT AND DIET  Eat a healthy diet  Be sure to include plenty of vegetables, fruits, low-fat dairy products, and lean protein.  Do not eat a lot of foods high in solid fats, added sugars, or salt.  Get regular exercise. This is one of the most important things you can do for your health.  Most adults should exercise for at least 150 minutes each week. The exercise should increase your heart rate and make you sweat (moderate-intensity exercise).  Most adults should also do strengthening exercises at least twice a week. This is in addition to the moderate-intensity exercise.  Maintain a healthy weight  Body mass index (BMI) is a measurement that can be used to identify possible weight problems. It estimates body fat based on height and weight. Your health care provider can help determine your BMI and help you achieve or maintain a healthy weight.  For females 48 years of age and older:   A BMI below 18.5 is considered underweight.  A BMI of 18.5 to 24.9 is normal.  A BMI of 25 to 29.9 is considered overweight.  A BMI of 30 and above is considered obese.  Watch levels of cholesterol and blood lipids  You should start having your blood tested for lipids and cholesterol at 69 years of age, then have this test every 5  years.  You may need to have your cholesterol levels checked more often if:  Your lipid or cholesterol levels are high.  You are older than 69 years of age.  You are at high risk for heart disease.  CANCER SCREENING   Lung Cancer  Lung cancer screening is recommended for adults 7-46 years old who are at high risk for lung cancer because of a history of smoking.  A yearly low-dose CT scan of the lungs is recommended for people who:  Currently smoke.  Have quit within the past 15 years.  Have at least a 30-pack-year history of smoking. A pack year is smoking an average of one pack of cigarettes a day for 1 year.  Yearly screening should continue until it has been 15 years since you quit.  Yearly screening should stop if you develop a health problem that would prevent you from having lung cancer treatment.  Breast Cancer  Practice breast self-awareness. This means understanding how your breasts normally appear and feel.  It also means doing regular breast self-exams. Let your health care provider know about any changes, no matter how small.  If you are in your 20s or 30s, you should have a clinical breast exam (CBE) by a health care provider every 1-3 years as part of a regular health exam.  If you are 39 or older, have a CBE every year. Also consider having a breast X-ray (mammogram) every year.  If you have a family history of  breast cancer, talk to your health care provider about genetic screening.  If you are at high risk for breast cancer, talk to your health care provider about having an MRI and a mammogram every year.  Breast cancer gene (BRCA) assessment is recommended for women who have family members with BRCA-related cancers. BRCA-related cancers include:  Breast.  Ovarian.  Tubal.  Peritoneal cancers.  Results of the assessment will determine the need for genetic counseling and BRCA1 and BRCA2 testing. Cervical Cancer Routine pelvic examinations to  screen for cervical cancer are no longer recommended for nonpregnant women who are considered low risk for cancer of the pelvic organs (ovaries, uterus, and vagina) and who do not have symptoms. A pelvic examination may be necessary if you have symptoms including those associated with pelvic infections. Ask your health care provider if a screening pelvic exam is right for you.   The Pap test is the screening test for cervical cancer for women who are considered at risk.  If you had a hysterectomy for a problem that was not cancer or a condition that could lead to cancer, then you no longer need Pap tests.  If you are older than 65 years, and you have had normal Pap tests for the past 10 years, you no longer need to have Pap tests.  If you have had past treatment for cervical cancer or a condition that could lead to cancer, you need Pap tests and screening for cancer for at least 20 years after your treatment.  If you no longer get a Pap test, assess your risk factors if they change (such as having a new sexual partner). This can affect whether you should start being screened again.  Some women have medical problems that increase their chance of getting cervical cancer. If this is the case for you, your health care provider may recommend more frequent screening and Pap tests.  The human papillomavirus (HPV) test is another test that may be used for cervical cancer screening. The HPV test looks for the virus that can cause cell changes in the cervix. The cells collected during the Pap test can be tested for HPV.  The HPV test can be used to screen women 10 years of age and older. Getting tested for HPV can extend the interval between normal Pap tests from three to five years.  An HPV test also should be used to screen women of any age who have unclear Pap test results.  After 69 years of age, women should have HPV testing as often as Pap tests.  Colorectal Cancer  This type of cancer can be  detected and often prevented.  Routine colorectal cancer screening usually begins at 69 years of age and continues through 69 years of age.  Your health care provider may recommend screening at an earlier age if you have risk factors for colon cancer.  Your health care provider may also recommend using home test kits to check for hidden blood in the stool.  A small camera at the end of a tube can be used to examine your colon directly (sigmoidoscopy or colonoscopy). This is done to check for the earliest forms of colorectal cancer.  Routine screening usually begins at age 28.  Direct examination of the colon should be repeated every 5-10 years through 69 years of age. However, you may need to be screened more often if early forms of precancerous polyps or small growths are found. Skin Cancer  Check your skin from head to  toe regularly.  Tell your health care provider about any new moles or changes in moles, especially if there is a change in a mole's shape or color.  Also tell your health care provider if you have a mole that is larger than the size of a pencil eraser.  Always use sunscreen. Apply sunscreen liberally and repeatedly throughout the day.  Protect yourself by wearing long sleeves, pants, a wide-brimmed hat, and sunglasses whenever you are outside. HEART DISEASE, DIABETES, AND HIGH BLOOD PRESSURE   Have your blood pressure checked at least every 1-2 years. High blood pressure causes heart disease and increases the risk of stroke.  If you are between 13 years and 22 years old, ask your health care provider if you should take aspirin to prevent strokes.  Have regular diabetes screenings. This involves taking a blood sample to check your fasting blood sugar level.  If you are at a normal weight and have a low risk for diabetes, have this test once every three years after 69 years of age.  If you are overweight and have a high risk for diabetes, consider being tested at a  younger age or more often. PREVENTING INFECTION  Hepatitis B  If you have a higher risk for hepatitis B, you should be screened for this virus. You are considered at high risk for hepatitis B if:  You were born in a country where hepatitis B is common. Ask your health care provider which countries are considered high risk.  Your parents were born in a high-risk country, and you have not been immunized against hepatitis B (hepatitis B vaccine).  You have HIV or AIDS.  You use needles to inject street drugs.  You live with someone who has hepatitis B.  You have had sex with someone who has hepatitis B.  You get hemodialysis treatment.  You take certain medicines for conditions, including cancer, organ transplantation, and autoimmune conditions. Hepatitis C  Blood testing is recommended for:  Everyone born from [birth year] through 1966.  Anyone with known risk factors for hepatitis C. Sexually transmitted infections (STIs)  You should be screened for sexually transmitted infections (STIs) including gonorrhea and chlamydia if:  You are sexually active and are younger than 69 years of age.  You are older than 69 years of age and your health care provider tells you that you are at risk for this type of infection.  Your sexual activity has changed since you were last screened and you are at an increased risk for chlamydia or gonorrhea. Ask your health care provider if you are at risk.  If you do not have HIV, but are at risk, it may be recommended that you take a prescription medicine daily to prevent HIV infection. This is called pre-exposure prophylaxis (PrEP). You are considered at risk if:  You are sexually active and do not regularly use condoms or know the HIV status of your partner(s).  You take drugs by injection.  You are sexually active with a partner who has HIV. Talk with your health care provider about whether you are at high risk of being infected with HIV. If you choose  to begin PrEP, you should first be tested for HIV. You should then be tested every 3 months for as long as you are taking PrEP.  PREGNANCY   If you are premenopausal and you may become pregnant, ask your health care provider about preconception counseling.  If you may become pregnant, take 400 to 800 micrograms (mcg)  of folic acid every day.  If you want to prevent pregnancy, talk to your health care provider about birth control (contraception). OSTEOPOROSIS AND MENOPAUSE   Osteoporosis is a disease in which the bones lose minerals and strength with aging. This can result in serious bone fractures. Your risk for osteoporosis can be identified using a bone density scan.  If you are 61 years of age or older, or if you are at risk for osteoporosis and fractures, ask your health care provider if you should be screened.  Ask your health care provider whether you should take a calcium or vitamin D supplement to lower your risk for osteoporosis.  Menopause may have certain physical symptoms and risks.  Hormone replacement therapy may reduce some of these symptoms and risks. Talk to your health care provider about whether hormone replacement therapy is right for you.  HOME CARE INSTRUCTIONS   Schedule regular health, dental, and eye exams.  Stay current with your immunizations.   Do not use any tobacco products including cigarettes, chewing tobacco, or electronic cigarettes.  If you are pregnant, do not drink alcohol.  If you are breastfeeding, limit how much and how often you drink alcohol.  Limit alcohol intake to no more than 1 drink per day for nonpregnant women. One drink equals 12 ounces of beer, 5 ounces of wine, or 1 ounces of hard liquor.  Do not use street drugs.  Do not share needles.  Ask your health care provider for help if you need support or information about quitting drugs.  Tell your health care provider if you often feel depressed.  Tell your health care  provider if you have ever been abused or do not feel safe at home. Document Released: 07/08/2010 Document Revised: 05/09/2013 Document Reviewed: 11/24/2012 [facility] Patient Information 2015 [place], [place]. This information is not intended to replace advice given to you by your health care provider. Make sure you discuss any questions you have with your health care provider.

## 2013-08-04 NOTE — Assessment & Plan Note (Signed)
Preventative protocols reviewed and updated unless pt declined. Discussed healthy diet and lifestyle.  

## 2013-08-04 NOTE — Progress Notes (Signed)
Pre visit review using our clinic review tool, if applicable. No additional management support is needed unless otherwise documented below in the visit note. 

## 2013-08-04 NOTE — Assessment & Plan Note (Signed)
A1c 5.8% reviewed with patient.

## 2013-08-08 ENCOUNTER — Encounter: Payer: 59 | Admitting: Family Medicine

## 2013-12-27 ENCOUNTER — Other Ambulatory Visit: Payer: Self-pay | Admitting: *Deleted

## 2013-12-27 MED ORDER — PRAVASTATIN SODIUM 40 MG PO TABS: ORAL_TABLET | ORAL | Status: DC

## 2014-03-29 LAB — HM MAMMOGRAPHY: HM MAMMO: NORMAL

## 2014-04-03 ENCOUNTER — Encounter: Payer: Self-pay | Admitting: *Deleted

## 2014-04-03 ENCOUNTER — Encounter: Payer: Self-pay | Admitting: Family Medicine

## 2014-04-13 ENCOUNTER — Ambulatory Visit (INDEPENDENT_AMBULATORY_CARE_PROVIDER_SITE_OTHER): Payer: 59 | Admitting: Family Medicine

## 2014-04-13 ENCOUNTER — Encounter: Payer: Self-pay | Admitting: Family Medicine

## 2014-04-13 VITALS — BP 110/70 | HR 72 | Temp 98.0°F | Wt 136.2 lb

## 2014-04-13 DIAGNOSIS — R634 Abnormal weight loss: Secondary | ICD-10-CM | POA: Diagnosis not present

## 2014-04-13 DIAGNOSIS — R5382 Chronic fatigue, unspecified: Secondary | ICD-10-CM | POA: Diagnosis not present

## 2014-04-13 DIAGNOSIS — R350 Frequency of micturition: Secondary | ICD-10-CM | POA: Diagnosis not present

## 2014-04-13 DIAGNOSIS — Z8041 Family history of malignant neoplasm of ovary: Secondary | ICD-10-CM

## 2014-04-13 HISTORY — DX: Abnormal weight loss: R63.4

## 2014-04-13 LAB — CBC WITH DIFFERENTIAL/PLATELET
BASOS PCT: 0.5 % (ref 0.0–3.0)
Basophils Absolute: 0 10*3/uL (ref 0.0–0.1)
EOS ABS: 0.2 10*3/uL (ref 0.0–0.7)
Eosinophils Relative: 3.2 % (ref 0.0–5.0)
HCT: 39.5 % (ref 36.0–46.0)
Hemoglobin: 13.6 g/dL (ref 12.0–15.0)
Lymphocytes Relative: 32.4 % (ref 12.0–46.0)
Lymphs Abs: 1.5 10*3/uL (ref 0.7–4.0)
MCHC: 34.5 g/dL (ref 30.0–36.0)
MCV: 96 fl (ref 78.0–100.0)
MONO ABS: 0.4 10*3/uL (ref 0.1–1.0)
Monocytes Relative: 9.3 % (ref 3.0–12.0)
Neutro Abs: 2.6 10*3/uL (ref 1.4–7.7)
Neutrophils Relative %: 54.6 % (ref 43.0–77.0)
PLATELETS: 206 10*3/uL (ref 150.0–400.0)
RBC: 4.11 Mil/uL (ref 3.87–5.11)
RDW: 13.2 % (ref 11.5–15.5)
WBC: 4.8 10*3/uL (ref 4.0–10.5)

## 2014-04-13 LAB — COMPREHENSIVE METABOLIC PANEL
ALBUMIN: 4.3 g/dL (ref 3.5–5.2)
ALT: 12 U/L (ref 0–35)
AST: 18 U/L (ref 0–37)
Alkaline Phosphatase: 55 U/L (ref 39–117)
BUN: 11 mg/dL (ref 6–23)
CALCIUM: 9.5 mg/dL (ref 8.4–10.5)
CHLORIDE: 102 meq/L (ref 96–112)
CO2: 31 meq/L (ref 19–32)
Creatinine, Ser: 0.69 mg/dL (ref 0.40–1.20)
GFR: 89.52 mL/min (ref >60.00)
Glucose, Bld: 83 mg/dL (ref 70–99)
Potassium: 3.8 mEq/L (ref 3.5–5.1)
Sodium: 139 mEq/L (ref 135–145)
TOTAL PROTEIN: 7.6 g/dL (ref 6.0–8.3)
Total Bilirubin: 0.4 mg/dL (ref 0.2–1.2)

## 2014-04-13 LAB — SEDIMENTATION RATE: Sed Rate: 20 mm/hr (ref 0–22)

## 2014-04-13 LAB — POCT URINALYSIS DIPSTICK
Bilirubin, UA: NEGATIVE
Glucose, UA: NEGATIVE
KETONES UA: NEGATIVE
Nitrite, UA: NEGATIVE
PH UA: 7
PROTEIN UA: NEGATIVE
RBC UA: NEGATIVE
Spec Grav, UA: 1.015
Urobilinogen, UA: 0.2

## 2014-04-13 LAB — T4, FREE: Free T4: 0.82 ng/dL (ref 0.60–1.60)

## 2014-04-13 LAB — TSH: TSH: 3.72 u[IU]/mL (ref 0.35–4.50)

## 2014-04-13 NOTE — Addendum Note (Signed)
Addended by: Ellamae Sia on: 04/13/2014 05:24 PM   Modules accepted: Orders

## 2014-04-13 NOTE — Progress Notes (Signed)
Pre visit review using our clinic review tool, if applicable. No additional management support is needed unless otherwise documented below in the visit note. 

## 2014-04-13 NOTE — Progress Notes (Signed)
BP 110/70 mmHg  Pulse 72  Temp(Src) 98 F (36.7 C) (Oral)  Wt 136 lb 4 oz (61.803 kg)   CC: fatigue, weight loss  Subjective:    Patient ID: Marilyn Cook, female    DOB: 07/26/1945, 69 y.o.   MRN: [id number]  HPI: Marilyn Cook is a 69 y.o. female presenting on 04/13/2014 for Fatigue and Weight Loss   Works 4 hours/day - staying markedly fatigued even with this. Also funny feeling in stomach that she has difficulty describing - "nervous, just don't feel right". + some urinary frequency.  Also worried about weight loss - 20 lbs in last few years. Not trying.   No fevers/chills, night sweats, appetite changes, abd pain, nausea/vomiting, indigestion or heartburn/reflux, diarrhea/constipation, easy bruising or bleeding, swollen glands. No hematuria or blood in stool.  Sleeps on average 8 hours/day.   R arm pain s/p steroid injection a few weeks ago which helped.   S/p partial hysterectomy 1987 for fibroids.  Colonoscopy scheduled for Oct this year  Preventative: Colonoscopy (2009) diverticulosis, 2 mm sigmoid polyp, Olevia Perches). Rec rpt 7 yrs.  Mammogram 03/2014 - normal, rec rpt 1 yr  Pelvic exam last done ~2014 bimanual only done. Partial hysterectomy for benign reason (fibroids), ovaries remain.   Relevant past medical, surgical, family and social history reviewed and updated as indicated. Interim medical history since our last visit reviewed. Allergies and medications reviewed and updated. Current Outpatient Prescriptions on File Prior to Visit  Medication Sig  . acetaminophen (TYLENOL ARTHRITIS PAIN) 650 MG CR tablet as needed.    [name] azelastine (ASTELIN) 137 MCG/SPRAY nasal spray Place 1-2 sprays into both nostrils 2 (two) times daily. Use in each nostril as directed  . Calcium Carb-Cholecalciferol (CALCIUM-VITAMIN D) 600-400 MG-UNIT TABS Take 1 tablet by mouth 3 (three) times daily.  . cetirizine (ZYRTEC) 10 MG tablet Take 10 mg by mouth daily.  . Cinnamon 500 MG capsule  Take 1,000 mg by mouth 2 (two) times daily.   [name] co-enzyme Q-10 30 MG capsule Take 30 mg by mouth daily.  [name] CRANBERRY PO Take by mouth.    . cyanocobalamin 100 MCG tablet Take 100 mcg by mouth daily.    . Desoximetasone 0.05 % GEL Apply 1 application topically 2 (two) times daily.  . hydrocortisone 2.5 % cream Apply topically 2 (two) times daily as needed.    . Misc Natural Products (BLACK CHERRY CONCENTRATE PO) Take by mouth daily.  . Omega-3 Fatty Acids (FISH OIL PO) Take 2 g by mouth daily.   . Potassium Chloride (K-TABS PO) Take by mouth daily.   . pravastatin (PRAVACHOL) 40 MG tablet Take one tablet once daily.  . Pyridoxine HCl (VITAMIN B-6 PO) Take 1 tablet by mouth daily.  [name] triamcinolone (KENALOG) topical spray Apply topically 2 (two) times daily as needed. For psoriasis  . VITAMIN E COMPLEX PO Take 1 capsule by mouth daily.   No current facility-administered medications on file prior to visit.   Family History  Problem Relation Age of Onset  . Heart disease Father     MI   . Stroke Father     CVA  . Stroke Mother     CVA  . Hypertension Sister   . Cancer Sister     ovarian and colon cancer  . Aneurysm Brother   . Hypertension Brother   . Cancer Paternal Aunt     ? colon  . Cancer Paternal Grandfather     liver, alcohol  .  Alcohol abuse Paternal Grandfather   . Hypertension Brother   . Cancer Other     prostate  . Cancer Maternal Aunt     breast   Review of Systems Per HPI unless specifically indicated above     Objective:    BP 110/70 mmHg  Pulse 72  Temp(Src) 98 F (36.7 C) (Oral)  Wt 136 lb 4 oz (61.803 kg)  Wt Readings from Last 3 Encounters:  04/13/14 136 lb 4 oz (61.803 kg)  08/04/13 143 lb 4 oz (64.978 kg)  03/28/13 148 lb 8 oz (67.359 kg)    Physical Exam  Constitutional: She appears well-developed and well-nourished. No distress.  HENT:  Mouth/Throat: Oropharynx is clear and moist. No oropharyngeal exudate.  Neck: No thyromegaly present.    Cardiovascular: Normal rate, regular rhythm, normal heart sounds and intact distal pulses.   No murmur heard. Pulmonary/Chest: Effort normal and breath sounds normal. No respiratory distress. She has no wheezes. She has no rales.  Abdominal: Soft. Normal appearance and bowel sounds are normal. She exhibits no distension and no mass. There is no hepatosplenomegaly. There is tenderness (mild) in the suprapubic area. There is no rigidity, no rebound, no guarding, no CVA tenderness and negative Murphy's sign.  Musculoskeletal: She exhibits no edema.  Skin: Skin is warm and dry. No rash noted.  Nursing note and vitals reviewed.  Results for orders placed or performed in visit on 04/03/14  [facility] MAMMOGRAPHY  Result Value Ref Range   [facility] Mammogram Normal Birads 2-Repeat 1 year       Assessment & Plan:   Problem List Items Addressed This Visit    Loss of weight - Primary    Endorses unexpected weight loss over last several years associated with fatigue. Unclear etiology on exam today. Preventative healthcare overall UTD - reviewed. Start with labwork - iFOB, CBC, CMP, CA 125 (given fmhx), TSH and ESR. Check UA. Will update pt with results. Pt agrees with plan.      Relevant Orders   Comprehensive metabolic panel   TSH   T4, free   CBC with Differential/Platelet   Sedimentation rate   Fecal occult blood, imunochemical   CA 125    Other Visit Diagnoses    Chronic fatigue        Relevant Orders    Fecal occult blood, imunochemical    Family history of ovarian cancer        Relevant Orders    CA 125    Urine frequency        Relevant Orders    POCT Urinalysis Dipstick        Follow up plan: Return as needed.

## 2014-04-13 NOTE — Patient Instructions (Addendum)
Blood work today. Urine checked today. Pass by lab to pick up stool kit. We will call you with results. Watch weight - if persistent weight loss let us know.

## 2014-04-13 NOTE — Assessment & Plan Note (Addendum)
Endorses unexpected weight loss over last several years associated with fatigue. Unclear etiology on exam today. Preventative healthcare overall UTD - reviewed. Start with labwork - iFOB, CBC, CMP, CA 125 (given fmhx), TSH and ESR. Check UA. Will update pt with results. Pt agrees with plan.

## 2014-04-14 ENCOUNTER — Encounter: Payer: Self-pay | Admitting: *Deleted

## 2014-04-14 LAB — URINALYSIS, MICROSCOPIC ONLY
CASTS: NONE SEEN
Crystals: NONE SEEN
Squamous Epithelial / LPF: NONE SEEN

## 2014-04-14 LAB — URINE CULTURE: Colony Count: 30000

## 2014-04-14 LAB — CA 125: CA 125: 1 U/mL (ref <35)

## 2014-04-17 ENCOUNTER — Encounter: Payer: Self-pay | Admitting: *Deleted

## 2014-04-21 ENCOUNTER — Other Ambulatory Visit: Payer: 59

## 2014-04-21 DIAGNOSIS — R634 Abnormal weight loss: Secondary | ICD-10-CM

## 2014-04-21 DIAGNOSIS — R5382 Chronic fatigue, unspecified: Secondary | ICD-10-CM

## 2014-04-21 LAB — FECAL OCCULT BLOOD, IMMUNOCHEMICAL: Fecal Occult Bld: NEGATIVE

## 2014-04-21 LAB — FECAL OCCULT BLOOD, GUAIAC: FECAL OCCULT BLD: NEGATIVE

## 2014-04-25 ENCOUNTER — Encounter: Payer: Self-pay | Admitting: *Deleted

## 2014-06-08 ENCOUNTER — Telehealth: Payer: Self-pay | Admitting: Family Medicine

## 2014-06-08 ENCOUNTER — Encounter: Payer: Self-pay | Admitting: Internal Medicine

## 2014-06-08 ENCOUNTER — Ambulatory Visit (INDEPENDENT_AMBULATORY_CARE_PROVIDER_SITE_OTHER): Payer: 59 | Admitting: Internal Medicine

## 2014-06-08 VITALS — BP 122/70 | HR 66 | Temp 98.3°F | Wt 137.0 lb

## 2014-06-08 DIAGNOSIS — G459 Transient cerebral ischemic attack, unspecified: Secondary | ICD-10-CM | POA: Diagnosis not present

## 2014-06-08 HISTORY — DX: Transient cerebral ischemic attack, unspecified: G45.9

## 2014-06-08 NOTE — Progress Notes (Signed)
Subjective:    Patient ID: Marilyn Cook, female    DOB: [birth year], 69 y.o.   MRN: [id number]  HPI Here due to numbness  Started 1 week ago--on her way to ball game for grandson Started chewing gum and noted tingling and numbness in left face Then left hand went numb and tingling also-- 10 minutes Had chilled feeling later  Then went home--to sleep Next morning only left thumb was numb This went away quickly--like a few minutes  No speech or swallowing problems No weakness No facial droop  Current Outpatient Prescriptions on File Prior to Visit  Medication Sig Dispense Refill  . acetaminophen (TYLENOL ARTHRITIS PAIN) 650 MG CR tablet as needed.      [name] azelastine (ASTELIN) 137 MCG/SPRAY nasal spray Place 1-2 sprays into both nostrils 2 (two) times daily. Use in each nostril as directed 30 mL 11  . Calcium Carb-Cholecalciferol (CALCIUM-VITAMIN D) 600-400 MG-UNIT TABS Take 1 tablet by mouth 3 (three) times daily.    . cetirizine (ZYRTEC) 10 MG tablet Take 10 mg by mouth daily.    . Cinnamon 500 MG capsule Take 1,000 mg by mouth 2 (two) times daily.     [name] co-enzyme Q-10 30 MG capsule Take 30 mg by mouth daily.    [name] CRANBERRY PO Take by mouth.      . cyanocobalamin 100 MCG tablet Take 100 mcg by mouth daily.      . Desoximetasone 0.05 % GEL Apply 1 application topically 2 (two) times daily. 15 g 1  . hydrocortisone 2.5 % cream Apply topically 2 (two) times daily as needed.      . Misc Natural Products (BLACK CHERRY CONCENTRATE PO) Take by mouth daily.    . Omega-3 Fatty Acids (FISH OIL PO) Take 2 g by mouth daily.     . Potassium Chloride (K-TABS PO) Take by mouth daily.     . pravastatin (PRAVACHOL) 40 MG tablet Take one tablet once daily. 90 tablet 1  . Pyridoxine HCl (VITAMIN B-6 PO) Take 1 tablet by mouth daily.    [name] triamcinolone (KENALOG) topical spray Apply topically 2 (two) times daily as needed. For psoriasis 63 g 1  . VITAMIN E COMPLEX PO Take 1 capsule by mouth daily.      No current facility-administered medications on file prior to visit.    Allergies  Allergen Reactions  . Aspirin   . Diphenhydramine Hcl   . Eggs Or Egg-Derived Products Hives and Swelling  . Epinephrine     REACTION: UNSPECIFIED  . Latex     REACTION: swelling  . Other Other (See Comments)    GI upset, headache to DAIRY PRODUCTS  . Penicillins   . Sulfasalazine     Past Medical History  Diagnosis Date  . Hyperlipemia   . NSVD (normal spontaneous vaginal delivery)     x 1  . Benign tumor of breast 1975  . Scalp psoriasis   . Osteopenia 08/2012    spine WNL, hip T -1.2    Past Surgical History  Procedure Laterality Date  . Partial hysterectomy  1987    due to fibroids, ovaries remain  . R lat epicondyle injections  1998  . Spiral ct  04/02    (-)  . Dexa  08/2012    spine WNL, hip T -1.2    Family History  Problem Relation Age of Onset  . Heart disease Father     MI   . Stroke Father  CVA  . Stroke Mother     CVA  . Hypertension Sister   . Cancer Sister     ovarian and colon cancer  . Aneurysm Brother   . Hypertension Brother   . Cancer Paternal Aunt     ? colon  . Cancer Paternal Grandfather     liver, alcohol  . Alcohol abuse Paternal Grandfather   . Hypertension Brother   . Cancer Other     prostate  . Cancer Maternal Aunt     breast    History   Social History  . Marital Status: Married    Spouse Name: N/A  . Number of Children: 1  . Years of Education: N/A   Occupational History  . [employer]    new hires   Social History Main Topics  . Smoking status: Never Smoker   . Smokeless tobacco: Never Used  . Alcohol Use: No  . Drug Use: No  . Sexual Activity: Yes   Other Topics Concern  . Not on file   Social History Narrative   Married and lives with husband   One son   Occupation: works at [employer] at [employer]   Activity: no regular exercise   Diet: some water, fruits/vegetables daily      Advanced directives:  discussed - HCPOA unsure will consider this. Needs to set up.   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 She appears well-developed and well-nourished. No distress.  HENT:  Mouth/Throat: Oropharynx is clear and moist. No oropharyngeal exudate.  Neck: Normal range of motion. Neck supple. No thyromegaly present.  No carotid bruits  Cardiovascular: Normal rate, regular rhythm and normal heart sounds.  Exam reveals no gallop.   No murmur heard. Pulmonary/Chest: Effort normal and breath sounds normal. No respiratory distress. She has no wheezes. She has no rales.  Lymphadenopathy:    She has no cervical adenopathy.  Neurological: She is alert and oriented to person, place, and time. She has normal strength. No cranial nerve deficit. She exhibits normal muscle tone. She displays a negative Romberg sign. Coordination and gait normal.  Psychiatric: She has a normal mood and affect. Her behavior is normal.          Assessment & Plan:

## 2014-06-08 NOTE — Progress Notes (Signed)
Pre visit review using our clinic review tool, if applicable. No additional management support is needed unless otherwise documented below in the visit note. 

## 2014-06-08 NOTE — Telephone Encounter (Signed)
See OV note.  

## 2014-06-08 NOTE — Telephone Encounter (Signed)
Pt has an appt scheduled 06/08/14 at 2:45 PM with Dr Silvio Pate.

## 2014-06-08 NOTE — Telephone Encounter (Signed)
Patient Name: Marilyn Cook  DOB: [birth year]    Initial Comment Caller states, Tues 6pm, the left side of face went numb and tingled. Then left hand numb and tingling. Those symptoms went away. Today in her Left thumb on hand is numb.    Nurse Assessment  Nurse: Mallie Mussel, RN, Alveta Heimlich Date/Time Eilene Ghazi Time): 06/08/2014 12:54:48 PM  Confirm and document reason for call. If symptomatic, describe symptoms. ---Caller states, Tues 6pm, the left side of face went numb and tingled which lasted about 1/2 hour. Then left hand numb and tingling. Those symptoms went away. Today in her Left thumb on hand is numb. She denies numbness at present.  Has the patient traveled out of the country within the last 30 days? ---No  Does the patient require triage? ---Yes  Related visit to physician within the last 2 weeks? ---No  Does the PT have any chronic conditions? (i.e. diabetes, asthma, etc.) ---No     Guidelines    Guideline Title Affirmed Question Affirmed Notes  Neurologic Deficit [1] Numbness (i.e., loss of sensation) of the face, arm or leg on one side of the body AND [2] sudden onset AND [3] transient (i.e., completely resolved)    Final Disposition User   Go to ED Now (or PCP triage) Mallie Mussel, RN, Alveta Heimlich    Comments  Appointment scheduled for today at 2:45pm with Dr. Viviana Simpler.

## 2014-06-08 NOTE — Assessment & Plan Note (Signed)
Sensory change in left cheek and hand Very suspicious as TIA Allergic to aspirin so can't have her start ?consider plavix Will check MRI--- doubt carotid distribution with same side symptoms (more likely posterior)

## 2014-06-20 ENCOUNTER — Other Ambulatory Visit: Payer: Self-pay | Admitting: *Deleted

## 2014-06-20 MED ORDER — PRAVASTATIN SODIUM 40 MG PO TABS: ORAL_TABLET | ORAL | Status: DC

## 2014-06-21 ENCOUNTER — Ambulatory Visit (HOSPITAL_COMMUNITY)
Admission: RE | Admit: 2014-06-21 | Discharge: 2014-06-21 | Disposition: A | Discharge status: Home / Self Care | Payer: 59 | Source: Ambulatory Visit | Attending: Internal Medicine | Admitting: Internal Medicine

## 2014-06-21 DIAGNOSIS — R200 Anesthesia of skin: Secondary | ICD-10-CM | POA: Diagnosis not present

## 2014-06-21 DIAGNOSIS — G319 Degenerative disease of nervous system, unspecified: Secondary | ICD-10-CM | POA: Diagnosis not present

## 2014-06-21 DIAGNOSIS — H539 Unspecified visual disturbance: Secondary | ICD-10-CM | POA: Diagnosis not present

## 2014-06-21 DIAGNOSIS — G459 Transient cerebral ischemic attack, unspecified: Secondary | ICD-10-CM

## 2014-06-26 ENCOUNTER — Telehealth: Payer: Self-pay | Admitting: Family Medicine

## 2014-06-26 ENCOUNTER — Encounter (HOSPITAL_COMMUNITY): Payer: Self-pay | Admitting: *Deleted

## 2014-06-26 ENCOUNTER — Emergency Department (HOSPITAL_COMMUNITY)
Admission: EM | Admit: 2014-06-26 | Discharge: 2014-06-26 | Disposition: A | Discharge status: Home / Self Care | Payer: 59 | Attending: Emergency Medicine | Admitting: Emergency Medicine

## 2014-06-26 DIAGNOSIS — R200 Anesthesia of skin: Secondary | ICD-10-CM | POA: Diagnosis not present

## 2014-06-26 DIAGNOSIS — Z91040 Latex allergy status: Secondary | ICD-10-CM | POA: Diagnosis not present

## 2014-06-26 DIAGNOSIS — M8580 Other specified disorders of bone density and structure, unspecified site: Secondary | ICD-10-CM | POA: Diagnosis not present

## 2014-06-26 DIAGNOSIS — E785 Hyperlipidemia, unspecified: Secondary | ICD-10-CM | POA: Diagnosis not present

## 2014-06-26 DIAGNOSIS — Z880 Allergy status to penicillin: Secondary | ICD-10-CM | POA: Diagnosis not present

## 2014-06-26 DIAGNOSIS — Z79899 Other long term (current) drug therapy: Secondary | ICD-10-CM | POA: Insufficient documentation

## 2014-06-26 DIAGNOSIS — Z872 Personal history of diseases of the skin and subcutaneous tissue: Secondary | ICD-10-CM | POA: Diagnosis not present

## 2014-06-26 DIAGNOSIS — H539 Unspecified visual disturbance: Secondary | ICD-10-CM | POA: Diagnosis not present

## 2014-06-26 DIAGNOSIS — G459 Transient cerebral ischemic attack, unspecified: Secondary | ICD-10-CM

## 2014-06-26 DIAGNOSIS — R202 Paresthesia of skin: Secondary | ICD-10-CM

## 2014-06-26 DIAGNOSIS — Z7952 Long term (current) use of systemic steroids: Secondary | ICD-10-CM | POA: Insufficient documentation

## 2014-06-26 DIAGNOSIS — Z86018 Personal history of other benign neoplasm: Secondary | ICD-10-CM | POA: Insufficient documentation

## 2014-06-26 LAB — BASIC METABOLIC PANEL
Anion gap: 8 (ref 5–15)
BUN: 9 mg/dL (ref 6–20)
CALCIUM: 9 mg/dL (ref 8.9–10.3)
CO2: 27 mmol/L (ref 22–32)
Chloride: 106 mmol/L (ref 101–111)
Creatinine, Ser: 0.68 mg/dL (ref 0.44–1.00)
GFR calc Af Amer: >60 mL/min (ref >60)
GFR calc non Af Amer: >60 mL/min (ref >60)
GLUCOSE: 107 mg/dL — AB (ref 65–99)
Potassium: 3.6 mmol/L (ref 3.5–5.1)
SODIUM: 141 mmol/L (ref 135–145)

## 2014-06-26 NOTE — Telephone Encounter (Signed)
Patient advised but she was already in the ER because of the advice that [facility] gave her.

## 2014-06-26 NOTE — ED Notes (Signed)
Pt states that she has had left hand numbness and tingling for over 1 month. Pt has had an MRI last week which was negative. Pt states that since then she has had "floaters" in her vision as well. No neuro deficits in triage.

## 2014-06-26 NOTE — Telephone Encounter (Signed)
Is she having any sxs now ? If not would expedite neurology referral rather than ER visit.

## 2014-06-26 NOTE — Telephone Encounter (Signed)
Noted, routed to PCP as FYI.  Thanks.  

## 2014-06-26 NOTE — ED Provider Notes (Signed)
CSN: [id number]     Arrival date & time 06/26/14  1720 History   First MD Initiated Contact with Patient 06/26/14 2053     Chief Complaint  Patient presents with  . Numbness     (Consider location/radiation/quality/duration/timing/severity/associated sxs/prior Treatment) HPI  69 year old female presents with transient left lateral lower leg numbness. This occurred a few hours prior to arrival and then spontaneously resolved after about 20 minutes. She states it was preceded by a "tightness" in her head that felt like she was having too much pressure and congestion. It felt like she needed to pop her ears and when they finally popped all of her symptoms resolved. There was no weakness. She has been having intermittent left hand/palmar numbness for the past 18 days. She had a normal MRI 5 days ago. She has also been having floaters in her eyes, especially when closing her eye over the past several weeks and was having it during her MRI. She has seen her ophthalmologist for this and has been told there is no obvious cause. She occasionally gets blurry vision in the left eye but this is also been going on for weeks and is not currently present. She called her PCP and they sent her to the ER given that she had recurrent numbness.  Past Medical History  Diagnosis Date  . Hyperlipemia   . NSVD (normal spontaneous vaginal delivery)     x 1  . Benign tumor of breast 1975  . Scalp psoriasis   . Osteopenia 08/2012    spine WNL, hip T -1.2   Past Surgical History  Procedure Laterality Date  . Partial hysterectomy  1987    due to fibroids, ovaries remain  . R lat epicondyle injections  1998  . Spiral ct  04/02    (-)  . Dexa  08/2012    spine WNL, hip T -1.2   Family History  Problem Relation Age of Onset  . Heart disease Father     MI   . Stroke Father     CVA  . Stroke Mother     CVA  . Hypertension Sister   . Cancer Sister     ovarian and colon cancer  . Aneurysm Brother   .  Hypertension Brother   . Cancer Paternal Aunt     ? colon  . Cancer Paternal Grandfather     liver, alcohol  . Alcohol abuse Paternal Grandfather   . Hypertension Brother   . Cancer Other     prostate  . Cancer Maternal Aunt     breast   History  Substance Use Topics  . Smoking status: Never Smoker   . Smokeless tobacco: Never Used  . Alcohol Use: No   OB History    No data available     Review of Systems  Eyes: Positive for visual disturbance.  Gastrointestinal: Negative for vomiting.  Neurological: Positive for numbness. Negative for dizziness, weakness and headaches.  All other systems reviewed and are negative.     Allergies  Aspirin; Diphenhydramine hcl; Eggs or egg-derived products; Epinephrine; Latex; Other; Penicillins; and Sulfasalazine  Home Medications   Prior to Admission medications   Medication Sig Start Date End Date Taking? Authorizing Provider  acetaminophen (TYLENOL ARTHRITIS PAIN) 650 MG CR tablet Take 650 mg by mouth as needed for pain.    Yes Historical Provider, MD  azelastine (ASTELIN) 137 MCG/SPRAY nasal spray Place 1-2 sprays into both nostrils 2 (two) times daily. Use in each nostril  as directed 02/14/13  Yes Ria Bush, MD  Calcium Carb-Cholecalciferol (CALCIUM-VITAMIN D) 600-400 MG-UNIT TABS Take 1 tablet by mouth 3 (three) times daily.   Yes Historical Provider, MD  cetirizine (ZYRTEC) 10 MG tablet Take 10 mg by mouth daily.   Yes Historical Provider, MD  Cinnamon 500 MG capsule Take 1,000 mg by mouth 2 (two) times daily.    Yes Historical Provider, MD  co-enzyme Q-10 30 MG capsule Take 30 mg by mouth daily.   Yes Historical Provider, MD  CRANBERRY PO Take 1 tablet by mouth daily.    Yes Historical Provider, MD  cyanocobalamin 100 MCG tablet Take 100 mcg by mouth daily.     Yes Historical Provider, MD  Desoximetasone 0.05 % GEL Apply 1 application topically 2 (two) times daily. 06/25/13  Yes Billy Fischer, MD  hydrocortisone 2.5 % cream  Apply 1 application topically 2 (two) times daily as needed (itching).    Yes Historical Provider, MD  Misc Natural Products (BLACK CHERRY CONCENTRATE PO) Take 1 tablet by mouth daily.    Yes Historical Provider, MD  Omega-3 Fatty Acids (FISH OIL PO) Take 2 g by mouth daily.    Yes Historical Provider, MD  Potassium Chloride (K-TABS PO) Take 1 tablet by mouth daily.    Yes Historical Provider, MD  pravastatin (PRAVACHOL) 40 MG tablet Take one tablet once daily. 06/20/14  Yes Ria Bush, MD  Pyridoxine HCl (VITAMIN B-6 PO) Take 1 tablet by mouth daily.   Yes Historical Provider, MD  VITAMIN E COMPLEX PO Take 1 capsule by mouth daily.   Yes Historical Provider, MD   BP 148/64 mmHg  Pulse 75  Temp(Src) 98.1 F (36.7 C) (Oral)  Resp 20  Ht 5\' 2"  (1.575 m)  Wt 135 lb (61.236 kg)  BMI 24.69 kg/m2  SpO2 99% Physical Exam  Constitutional: She is oriented to person, place, and time. She appears well-developed and well-nourished. No distress.  HENT:  Head: Normocephalic and atraumatic.  Right Ear: External ear normal.  Left Ear: External ear normal.  Nose: Nose normal.  Eyes: Conjunctivae and EOM are normal. Pupils are equal, round, and reactive to light. Right eye exhibits no discharge. Left eye exhibits no discharge.  Neck: Neck supple.  Cardiovascular: Normal rate, regular rhythm and normal heart sounds.   Pulmonary/Chest: Effort normal and breath sounds normal.  Abdominal: Soft. She exhibits no distension. There is no tenderness.  Neurological: She is alert and oriented to person, place, and time.  CN 2-12 grossly intact. 5/5 strength in all 4 extremities. Normal gross sensation in all 4 extremities  Skin: Skin is warm and dry. She is not diaphoretic.  Nursing note and vitals reviewed.   ED Course  Procedures (including critical care time) Labs Review Labs Reviewed  BASIC METABOLIC PANEL - Abnormal; Notable for the following:    Glucose, Bld 107 (*)    All other components  within normal limits    Imaging Review No results found.   EKG Interpretation None      MDM   Final diagnoses:  None    Patient's exam currently is normal. With her transient lower leg numbness it is more likely a peripheral neuropathy him especially given her normal MRI 5 days ago for peripheral arm numbness. I discussed with patient and we both agree she does not need recurrent MRI currently. I will refer her to neurology as an outpatient. As for her eye symptoms these have been ongoing for several weeks/months and  I recommended she follow closely with her ophthalmologist.    Sherwood Gambler, MD 06/26/14 [phone]

## 2014-06-26 NOTE — Telephone Encounter (Signed)
[facility]  Patient Name: Marilyn Cook  DOB: 01-14-1945    Initial Comment Caller states she has had previous floaters in her eye. Arms are tingling and experiencing strange feeling in her head (possible pressure).    Nurse Assessment  Nurse: Justine Null, RN, Rodena Piety Date/Time (Eastern Time): 06/26/2014 4:40:23 PM  Confirm and document reason for call. If symptomatic, describe symptoms. ---Caller states she has had previous floaters in her eye. Arms are tingling and experiencing strange feeling in her head (possible pressure). caller stated that she was in the office on 06/08/14 and was having numbness in her face and lips and left hand tingling and she had an MRI and did the MRI on 06/21/14 and the results are no stroke and has been having the top of her head feels strange and has been having left eye more then right eye and has blurry vision and has no headache and has left leg from the bottom of her leg was numb and tingling  Has the patient traveled out of the country within the last 30 days? ---No  Does the patient require triage? ---Yes  Related visit to physician within the last 2 weeks? ---Yes  Does the PT have any chronic conditions? (i.e. diabetes, asthma, etc.) ---No     Guidelines    Guideline Title Affirmed Question Affirmed Notes  Neurologic Deficit [1] Numbness (i.e., loss of sensation) of the face, arm or leg on one side of the body AND [2] sudden onset AND [3] transient (i.e., completely resolved)    Final Disposition User   Go to ED Now (or PCP triage) Justine Null, RN, Rodena Piety

## 2014-06-27 ENCOUNTER — Encounter: Payer: Self-pay | Admitting: Family Medicine

## 2014-06-27 NOTE — Telephone Encounter (Signed)
Spoke to pt. She is aware [facility] will call her directly to schedule. Changed referral to urgent as Dr. Darnell Level would like this expedited.

## 2014-06-27 NOTE — Telephone Encounter (Signed)
referral placed. Would like to expedite it as sxs have led to ER eval

## 2014-06-27 NOTE — Telephone Encounter (Signed)
Pt went to ed yesterday [facility] and they recommend a neurologist. The recommend a Maryanna Shape Neurologist and pt wanted to know if you have a specific recommendation for a doctor or if you can get an appt quicker than a pt can.   cb pt number [phone].

## 2014-06-27 NOTE — Telephone Encounter (Signed)
I know you had mentioned an expedited neuro referral-but I don't see one in her chart. Has one been made for her?

## 2014-07-04 ENCOUNTER — Ambulatory Visit: Payer: 59 | Admitting: Family Medicine

## 2014-07-05 ENCOUNTER — Encounter: Payer: Self-pay | Admitting: Neurology

## 2014-07-05 ENCOUNTER — Ambulatory Visit (INDEPENDENT_AMBULATORY_CARE_PROVIDER_SITE_OTHER): Payer: 59 | Admitting: Neurology

## 2014-07-05 VITALS — BP 110/70 | HR 69 | Resp 16 | Ht 62.0 in | Wt 135.0 lb

## 2014-07-05 DIAGNOSIS — G459 Transient cerebral ischemic attack, unspecified: Secondary | ICD-10-CM

## 2014-07-05 DIAGNOSIS — R739 Hyperglycemia, unspecified: Secondary | ICD-10-CM | POA: Diagnosis not present

## 2014-07-05 NOTE — Progress Notes (Signed)
NEUROLOGY CONSULTATION NOTE  Marilyn Cook MRN: [id number] DOB: 1945-04-23  Referring provider: Dr. Danise Mina Primary care provider: Dr. Danise Mina  Reason for consult:  TIA, paresthesias  HISTORY OF PRESENT ILLNESS: Marilyn Cook is a 69 year old right-handed woman who presents for numbness.  Records, labs and MRI of brain reviewed.  About a month ago, she developed sudden onset numbness of her lips, which spread to include the left side of her face.  This subsequently spread to the palmar aspect of her left hand.  She then developed a stinging pain in her left thumb.  Symptoms lasted at least an hour but she still had residual symptoms involving the thumb afterwards.  The next day, she still noticed the stinging sensation in her thumb.  She saw her PCP.  An MRI of the brain was ordered, which was not able to be scheduled for about two weeks.  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  On 06/26/14, she developed a pressure-like sensation in her head.  She then developed numbness and tingling in the left leg from the knee down to the ankle.  It lasted only several minutes and resolved.  She was evaluated at the ED.  She had another episode, this time involving the left arm, lasting only a few minutes.  Labs were unremarkable except for mildly elevated glucose of 107.  She was subsequently sent home.  Since the initial event, she sometimes sees floaters, particularly if she closes her eyes.  She denies history of migraine or stroke.  She reports allergy to ASA (she does not remember her symptoms).  PAST MEDICAL HISTORY: Past Medical History  Diagnosis Date  . Hyperlipemia   . NSVD (normal spontaneous vaginal delivery)     x 1  . Benign tumor of breast 1975  . Scalp psoriasis   . Osteopenia 08/2012    spine WNL, hip T -1.2    PAST SURGICAL HISTORY: Past Surgical History  Procedure Laterality Date    . Partial hysterectomy  1987    due to fibroids, ovaries remain  . R lat epicondyle injections  1998  . Spiral ct  04/02    (-)  . Dexa  08/2012    spine WNL, hip T -1.2    MEDICATIONS: Current Outpatient Prescriptions on File Prior to Visit  Medication Sig Dispense Refill  . Calcium Carb-Cholecalciferol (CALCIUM-VITAMIN D) 600-400 MG-UNIT TABS Take 1 tablet by mouth 2 (two) times daily.     . cetirizine (ZYRTEC) 10 MG tablet Take 10 mg by mouth daily.    . Cinnamon 500 MG capsule Take 1,000 mg by mouth 2 (two) times daily.     [name] co-enzyme Q-10 30 MG capsule Take 30 mg by mouth daily.    [name] CRANBERRY PO Take 1 tablet by mouth daily.     . cyanocobalamin 100 MCG tablet Take 100 mcg by mouth daily.      . Misc Natural Products (BLACK CHERRY CONCENTRATE PO) Take 1 tablet by mouth daily.     . Omega-3 Fatty Acids (FISH OIL PO) Take 2 g by mouth daily.     . Potassium Chloride (K-TABS PO) Take 1 tablet by mouth daily.     . pravastatin (PRAVACHOL) 40 MG tablet Take one tablet once daily. 90 tablet 1  . Pyridoxine HCl (VITAMIN B-6 PO) Take 1 tablet by mouth daily.    [name] VITAMIN E COMPLEX PO  Take 1 capsule by mouth daily.    . hydrocortisone 2.5 % cream Apply 1 application topically 2 (two) times daily as needed (itching).      No current facility-administered medications on file prior to visit.    ALLERGIES: Allergies  Allergen Reactions  . Aspirin   . Diphenhydramine Hcl   . Eggs Or Egg-Derived Products Hives and Swelling  . Epinephrine     REACTION: UNSPECIFIED  . Latex     REACTION: swelling  . Other Other (See Comments)    GI upset, headache to DAIRY PRODUCTS  . Penicillins   . Sulfasalazine     FAMILY HISTORY: Family History  Problem Relation Age of Onset  . Heart disease Father     MI   . Stroke Father     CVA  . Stroke Mother     CVA  . Hypertension Sister   . Cancer Sister     ovarian and colon cancer  . Aneurysm Brother   . Hypertension Brother   . Cancer  Paternal Aunt     ? colon  . Cancer Paternal Grandfather     liver, alcohol  . Alcohol abuse Paternal Grandfather   . Hypertension Brother   . Cancer Other     prostate  . Cancer Maternal Aunt     breast    SOCIAL HISTORY: History   Social History  . Marital Status: Married    Spouse Name: N/A  . Number of Children: 1  . Years of Education: N/A   Occupational History  . HR     new hires   Social History Main Topics  . Smoking status: Never Smoker   . Smokeless tobacco: Never Used  . Alcohol Use: No  . Drug Use: No  . Sexual Activity: Yes   Other Topics Concern  . Not on file   Social History Narrative   Married and lives with husband   One son   Occupation: works at [employer] at [employer]   Activity: no regular exercise   Diet: some water, fruits/vegetables daily      Advanced directives: discussed - HCPOA unsure will consider this. Needs to set up.    REVIEW OF SYSTEMS: Constitutional: No fevers, chills, or sweats, no generalized fatigue, change in appetite Eyes: No visual changes, double vision, eye pain Ear, nose and throat: No hearing loss, ear pain, nasal congestion, sore throat Cardiovascular: No chest pain, palpitations Respiratory:  No shortness of breath at rest or with exertion, wheezes GastrointestinaI: No nausea, vomiting, diarrhea, abdominal pain, fecal incontinence Genitourinary:  No dysuria, urinary retention or frequency Musculoskeletal:  No neck pain, back pain Integumentary: No rash, pruritus, skin lesions Neurological: as above Psychiatric: No depression, insomnia, anxiety Endocrine: No palpitations, fatigue, diaphoresis, mood swings, change in appetite, change in weight, increased thirst Hematologic/Lymphatic:  No anemia, purpura, petechiae. Allergic/Immunologic: no itchy/runny eyes, nasal congestion, recent allergic reactions, rashes  PHYSICAL EXAM: Filed Vitals:   07/05/14 1227  BP: 110/70  Pulse: 69  Resp: 16   General:  No acute distress Head:  Normocephalic/atraumatic Eyes:  fundi unremarkable, without vessel changes, exudates, hemorrhages or papilledema. Neck: supple, no paraspinal tenderness, full range of motion Back: No paraspinal tenderness Heart: regular rate and rhythm Lungs: Clear to auscultation bilaterally. Vascular: No carotid bruits. Neurological Exam: Mental status: alert and oriented to person, place, and time, recent and remote memory intact, fund of knowledge intact, attention and concentration intact, speech fluent and not dysarthric, language intact. Cranial  nerves: CN I: not tested CN II: pupils equal, round and reactive to light, visual fields intact, fundi unremarkable, without vessel changes, exudates, hemorrhages or papilledema. CN III, IV, VI:  full range of motion, no nystagmus, no ptosis CN V: facial sensation intact CN VII: upper and lower face symmetric CN VIII: hearing intact CN IX, X: gag intact, uvula midline CN XI: sternocleidomastoid and trapezius muscles intact CN XII: tongue midline Bulk & Tone: normal, no fasciculations. Motor:  5/5 throughout Sensation:  Pinprick and vibration intact Deep Tendon Reflexes:  2+ throughout, toes downgoing. Finger to nose testing:  No dysmetria Heel to shin:  No dysmetria Gait:  Normal station and stride.  Able to turn and walk in tandem. Romberg negative.  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  She has no history of migraine.  PLAN: 1.  She is seeing her PCP on Friday.  I advised that she discuss possibility of starting Plavix (since she has ASA allergy) for secondary stroke prevention. 2.  Carotid doppler 3.  2D echo with bubble study 4.  Check fasting lipid panel and Hgb A1c 5.  Advised to see her ophthalmologist regarding blurred vision/floaters 6.  Follow up in 3 months.  Thank you for allowing me to take part in the care of this patient.  Metta Clines, DO  CC:  Ria Bush, MD

## 2014-07-05 NOTE — Patient Instructions (Addendum)
I would discuss with Dr. Darnell Level about starting Plavix for secondary stroke prevention We will check carotid doppler  [facility] A sign in and they will take you to vascular building   07/06/14 at 2:45 and 400pm We will check 2D echo with bubble  We will check fasting lipid panel and Hgb A1c Follow up in 3 months.

## 2014-07-06 ENCOUNTER — Ambulatory Visit (HOSPITAL_COMMUNITY): Payer: 59

## 2014-07-06 ENCOUNTER — Ambulatory Visit (HOSPITAL_BASED_OUTPATIENT_CLINIC_OR_DEPARTMENT_OTHER)
Admission: RE | Admit: 2014-07-06 | Discharge: 2014-07-06 | Disposition: A | Discharge status: Home / Self Care | Payer: 59 | Source: Ambulatory Visit | Attending: Neurology | Admitting: Neurology

## 2014-07-06 ENCOUNTER — Ambulatory Visit (HOSPITAL_COMMUNITY)
Admission: RE | Admit: 2014-07-06 | Discharge: 2014-07-06 | Disposition: A | Discharge status: Home / Self Care | Payer: 59 | Source: Ambulatory Visit | Attending: Neurology | Admitting: Neurology

## 2014-07-06 ENCOUNTER — Other Ambulatory Visit: Payer: 59

## 2014-07-06 DIAGNOSIS — G459 Transient cerebral ischemic attack, unspecified: Secondary | ICD-10-CM

## 2014-07-06 DIAGNOSIS — I071 Rheumatic tricuspid insufficiency: Secondary | ICD-10-CM | POA: Insufficient documentation

## 2014-07-06 DIAGNOSIS — R739 Hyperglycemia, unspecified: Secondary | ICD-10-CM

## 2014-07-06 DIAGNOSIS — I340 Nonrheumatic mitral (valve) insufficiency: Secondary | ICD-10-CM | POA: Diagnosis not present

## 2014-07-06 DIAGNOSIS — I371 Nonrheumatic pulmonary valve insufficiency: Secondary | ICD-10-CM | POA: Diagnosis not present

## 2014-07-06 DIAGNOSIS — I358 Other nonrheumatic aortic valve disorders: Secondary | ICD-10-CM | POA: Insufficient documentation

## 2014-07-06 LAB — HEMOGLOBIN A1C
Hgb A1c MFr Bld: 5.9 % — ABNORMAL HIGH (ref <5.7)
Mean Plasma Glucose: 123 mg/dL — ABNORMAL HIGH (ref <117)

## 2014-07-06 NOTE — Progress Notes (Signed)
*  PRELIMINARY RESULTS* Vascular Ultrasound Carotid Duplex (Doppler) has been completed.  Preliminary findings: Bilateral:  1-39% ICA stenosis.  Vertebral artery flow is antegrade.      Landry Mellow, RDMS, RVT]  07/06/2014, 4:33 PM

## 2014-07-06 NOTE — Progress Notes (Signed)
  Echocardiogram 2D Echocardiogram has been performed.  Jennette Dubin 07/06/2014, 4:29 PM

## 2014-07-07 ENCOUNTER — Encounter: Payer: Self-pay | Admitting: Family Medicine

## 2014-07-07 ENCOUNTER — Ambulatory Visit (INDEPENDENT_AMBULATORY_CARE_PROVIDER_SITE_OTHER): Payer: 59 | Admitting: Family Medicine

## 2014-07-07 VITALS — BP 110/72 | HR 68 | Wt 135.8 lb

## 2014-07-07 DIAGNOSIS — G459 Transient cerebral ischemic attack, unspecified: Secondary | ICD-10-CM | POA: Diagnosis not present

## 2014-07-07 DIAGNOSIS — R7303 Prediabetes: Secondary | ICD-10-CM

## 2014-07-07 DIAGNOSIS — R7309 Other abnormal glucose: Secondary | ICD-10-CM

## 2014-07-07 LAB — LIPID PANEL
CHOL/HDL RATIO: 2.9 ratio
Cholesterol: 149 mg/dL (ref 0–200)
HDL: 51 mg/dL (ref >=46)
LDL CALC: 82 mg/dL (ref 0–99)
Triglycerides: 80 mg/dL (ref <150)
VLDL: 16 mg/dL (ref 0–40)

## 2014-07-07 MED ORDER — TRIAMCINOLONE ACETONIDE 0.147 MG/GM EX AERS: INHALATION_SPRAY | Freq: Two times a day (BID) | CUTANEOUS | Status: DC | PRN

## 2014-07-07 MED ORDER — CLOPIDOGREL BISULFATE 75 MG PO TABS: 75.0000 mg | ORAL_TABLET | Freq: Every day | ORAL | Status: DC

## 2014-07-07 NOTE — Assessment & Plan Note (Signed)
Stable

## 2014-07-07 NOTE — Progress Notes (Signed)
BP 110/72 mmHg  Pulse 68  Wt 135 lb 12.8 oz (61.598 kg)  SpO2 98%   CC: f/u visit  Subjective:    Patient ID: Marilyn Cook, female    DOB: 03/25/1945, 69 y.o.   MRN: [id number]  HPI: Marilyn Cook is a 69 y.o. female presenting on 07/07/2014 for Follow-up   Recent paresthesias and visual changes s/p eval by Dr Silvio Pate then ER and latest neurology Dr Tomi Likens who thought she may be having TIAs of R thalamus. rec f/u with ophtho for vision changes/floaters. Over last 2 weeks feeling well. Needs to schedule f/u with ophtho. Last seen   No h/o migraines.  Aspirin allergy. Was suggested to consider plavix for secondary prevention.  Workup reviewed. Carotid US returned stable (1-39% bilat ICA stenosis).  TEE with bubble study returned normal, EF 55-60%.  MRI brain with chronic changes but no acute abnormality (premature cerebral/cerebellar atrophy, hypertensive changes of small vessel disease).   FLP WNL with LDL 82. On pravastatin 40mg  daily. A1c 5.9%.  Requests kenalog spray for scalp psoriasis  Relevant past medical, surgical, family and social history reviewed and updated as indicated. Interim medical history since our last visit reviewed. Allergies and medications reviewed and updated. Current Outpatient Prescriptions on File Prior to Visit  Medication Sig  . Calcium Carb-Cholecalciferol (CALCIUM-VITAMIN D) 600-400 MG-UNIT TABS Take 1 tablet by mouth 2 (two) times daily.   . cetirizine (ZYRTEC) 10 MG tablet Take 10 mg by mouth daily.  . Cinnamon 500 MG capsule Take 1,000 mg by mouth 2 (two) times daily.   [name] co-enzyme Q-10 30 MG capsule Take 30 mg by mouth daily.  [name] CRANBERRY PO Take 1 tablet by mouth daily.   . cyanocobalamin 100 MCG tablet Take 100 mcg by mouth daily.    . hydrocortisone 2.5 % cream Apply 1 application topically 2 (two) times daily as needed (itching).   . Misc Natural Products (BLACK CHERRY CONCENTRATE PO) Take 1 tablet by mouth daily.   . Omega-3 Fatty  Acids (FISH OIL PO) Take 2 g by mouth daily.   . Potassium Chloride (K-TABS PO) Take 1 tablet by mouth daily.   . pravastatin (PRAVACHOL) 40 MG tablet Take one tablet once daily.  . Pyridoxine HCl (VITAMIN B-6 PO) Take 1 tablet by mouth daily.  [name] VITAMIN E COMPLEX PO Take 1 capsule by mouth daily.   No current facility-administered medications on file prior to visit.    Review of Systems Per HPI unless specifically indicated above     Objective:    BP 110/72 mmHg  Pulse 68  Wt 135 lb 12.8 oz (61.598 kg)  SpO2 98%  Wt Readings from Last 3 Encounters:  07/07/14 135 lb 12.8 oz (61.598 kg)  07/05/14 135 lb (61.236 kg)  06/26/14 135 lb (61.236 kg)    Physical Exam  Constitutional: She appears well-developed and well-nourished. No distress.  HENT:  Mouth/Throat: Oropharynx is clear and moist. No oropharyngeal exudate.  Eyes: Conjunctivae are normal. Pupils are equal, round, and reactive to light. No scleral icterus.  Neck: Carotid bruit is not present. No thyromegaly present.  Cardiovascular: Normal rate, regular rhythm, normal heart sounds and intact distal pulses.   No murmur heard. Pulmonary/Chest: Effort normal and breath sounds normal. No respiratory distress. She has no wheezes. She has no rales.  Musculoskeletal: She exhibits no edema.  Lymphadenopathy:    She has no cervical adenopathy.  Skin: Skin is warm and dry. No rash noted.  Psychiatric: She has a normal mood and affect.  Nursing note and vitals reviewed.  Results for orders placed or performed in visit on 07/06/14  Lipid Profile  Result Value Ref Range   Cholesterol 149 0 - 200 mg/dL   Triglycerides 80 <150 mg/dL   HDL 51 >=46 mg/dL   Total CHOL/HDL Ratio 2.9 Ratio   VLDL 16 0 - 40 mg/dL   LDL Cholesterol 82 0 - 99 mg/dL  Hemoglobin A1c  Result Value Ref Range   Hgb A1c MFr Bld 5.9 (H) <5.7 %   Mean Plasma Glucose 123 (H) <117 mg/dL      Assessment & Plan:   Problem List Items Addressed This Visit     Prediabetes    Stable.      TIA (transient ischemic attack) - Primary    Presumed TIA. Discussed benefits/risks of plavix. Will start plavix 75mg  daily.  Discussed healthy diet.          Follow up plan: Return if symptoms worsen or fail to improve.

## 2014-07-07 NOTE — Assessment & Plan Note (Signed)
Presumed TIA. Discussed benefits/risks of plavix. Will start plavix 75mg  daily.  Discussed healthy diet.

## 2014-07-07 NOTE — Patient Instructions (Addendum)
Let's trial plavix sent to pharmacy. Let us know if any recurrent symptoms. Schedule appointment with eye doctor and keep appointment in October with Dr Tomi Likens. Nice to see you today, call us with questions.

## 2014-07-07 NOTE — Progress Notes (Signed)
Pre visit review using our clinic review tool, if applicable. No additional management support is needed unless otherwise documented below in the visit note. 

## 2014-07-11 ENCOUNTER — Telehealth: Payer: Self-pay | Admitting: *Deleted

## 2014-07-11 NOTE — Telephone Encounter (Signed)
Patient is aware that labs are normal  

## 2014-07-11 NOTE — Telephone Encounter (Signed)
-----   Message from Pieter Partridge, DO sent at 07/07/2014  8:45 AM EDT ----- Blood work looks okay

## 2014-07-21 ENCOUNTER — Telehealth: Payer: Self-pay

## 2014-07-21 NOTE — Telephone Encounter (Signed)
Note left pt was given rx for Plavix last week;pt noticed on Omega 3 fish oil a warning do not take if taking blood thinner. Pt wants to know if should d/c fish oil while taking plavix and if so are there other vitamins or supplements she should take.Please advise.

## 2014-07-22 NOTE — Telephone Encounter (Signed)
Her triglyceride levels were really good on last check. Ok to stop fish oil. Will reassess next lipid panel and decide if any further supplement is needed at that time.

## 2014-07-24 NOTE — Telephone Encounter (Signed)
Patient advised.

## 2014-09-05 ENCOUNTER — Other Ambulatory Visit: Payer: Self-pay | Admitting: Orthopaedic Surgery

## 2014-09-05 DIAGNOSIS — M25511 Pain in right shoulder: Secondary | ICD-10-CM

## 2014-09-06 ENCOUNTER — Other Ambulatory Visit (HOSPITAL_COMMUNITY): Payer: Self-pay | Admitting: Orthopaedic Surgery

## 2014-09-06 DIAGNOSIS — M25511 Pain in right shoulder: Secondary | ICD-10-CM

## 2014-09-13 ENCOUNTER — Ambulatory Visit (HOSPITAL_COMMUNITY)
Admission: RE | Admit: 2014-09-13 | Discharge: 2014-09-13 | Disposition: A | Discharge status: Home / Self Care | Payer: 59 | Source: Ambulatory Visit | Attending: Orthopaedic Surgery | Admitting: Orthopaedic Surgery

## 2014-09-13 DIAGNOSIS — M19011 Primary osteoarthritis, right shoulder: Secondary | ICD-10-CM | POA: Insufficient documentation

## 2014-09-13 DIAGNOSIS — M75101 Unspecified rotator cuff tear or rupture of right shoulder, not specified as traumatic: Secondary | ICD-10-CM | POA: Insufficient documentation

## 2014-09-13 DIAGNOSIS — M25511 Pain in right shoulder: Secondary | ICD-10-CM | POA: Insufficient documentation

## 2014-09-13 DIAGNOSIS — G8929 Other chronic pain: Secondary | ICD-10-CM | POA: Diagnosis not present

## 2014-10-05 ENCOUNTER — Telehealth: Payer: Self-pay | Admitting: Family Medicine

## 2014-10-05 NOTE — Telephone Encounter (Signed)
Patient notified and appt scheduled.

## 2014-10-05 NOTE — Telephone Encounter (Signed)
received clearance request for shoulder arthroscopy. plz schedule office visit here for preop eval. Will also need neurology clearance and recs on plavix. I see she already has scheduled appt with Dr Loretta Plume.

## 2014-10-09 ENCOUNTER — Encounter: Payer: Self-pay | Admitting: Internal Medicine

## 2014-10-10 ENCOUNTER — Ambulatory Visit (INDEPENDENT_AMBULATORY_CARE_PROVIDER_SITE_OTHER): Payer: 59 | Admitting: Family Medicine

## 2014-10-10 ENCOUNTER — Ambulatory Visit (INDEPENDENT_AMBULATORY_CARE_PROVIDER_SITE_OTHER)
Admission: RE | Admit: 2014-10-10 | Discharge: 2014-10-10 | Disposition: A | Discharge status: Home / Self Care | Payer: 59 | Source: Ambulatory Visit | Attending: Family Medicine | Admitting: Family Medicine

## 2014-10-10 ENCOUNTER — Encounter: Payer: Self-pay | Admitting: Family Medicine

## 2014-10-10 ENCOUNTER — Encounter: Payer: Self-pay | Admitting: Neurology

## 2014-10-10 VITALS — BP 136/78 | HR 68 | Temp 97.7°F | Wt 130.2 lb

## 2014-10-10 DIAGNOSIS — G459 Transient cerebral ischemic attack, unspecified: Secondary | ICD-10-CM

## 2014-10-10 DIAGNOSIS — Z01818 Encounter for other preprocedural examination: Secondary | ICD-10-CM

## 2014-10-10 DIAGNOSIS — M25511 Pain in right shoulder: Secondary | ICD-10-CM | POA: Diagnosis not present

## 2014-10-10 DIAGNOSIS — R634 Abnormal weight loss: Secondary | ICD-10-CM | POA: Diagnosis not present

## 2014-10-10 DIAGNOSIS — G8929 Other chronic pain: Secondary | ICD-10-CM

## 2014-10-10 LAB — BASIC METABOLIC PANEL
BUN: 11 mg/dL (ref 6–23)
CALCIUM: 9.2 mg/dL (ref 8.4–10.5)
CO2: 30 mEq/L (ref 19–32)
CREATININE: 0.65 mg/dL (ref 0.40–1.20)
Chloride: 105 mEq/L (ref 96–112)
GFR: 95.77 mL/min (ref >60.00)
Glucose, Bld: 95 mg/dL (ref 70–99)
POTASSIUM: 4.2 meq/L (ref 3.5–5.1)
Sodium: 141 mEq/L (ref 135–145)

## 2014-10-10 LAB — CBC WITH DIFFERENTIAL/PLATELET
BASOS PCT: 0.5 % (ref 0.0–3.0)
Basophils Absolute: 0 10*3/uL (ref 0.0–0.1)
EOS ABS: 0.1 10*3/uL (ref 0.0–0.7)
EOS PCT: 1.4 % (ref 0.0–5.0)
HEMATOCRIT: 41.6 % (ref 36.0–46.0)
Hemoglobin: 14 g/dL (ref 12.0–15.0)
LYMPHS PCT: 26.7 % (ref 12.0–46.0)
Lymphs Abs: 1.2 10*3/uL (ref 0.7–4.0)
MCHC: 33.5 g/dL (ref 30.0–36.0)
MCV: 97.5 fl (ref 78.0–100.0)
Monocytes Absolute: 0.5 10*3/uL (ref 0.1–1.0)
Monocytes Relative: 10.2 % (ref 3.0–12.0)
NEUTROS ABS: 2.8 10*3/uL (ref 1.4–7.7)
Neutrophils Relative %: 61.2 % (ref 43.0–77.0)
PLATELETS: 206 10*3/uL (ref 150.0–400.0)
RBC: 4.27 Mil/uL (ref 3.87–5.11)
RDW: 13.1 % (ref 11.5–15.5)
WBC: 4.6 10*3/uL (ref 4.0–10.5)

## 2014-10-10 LAB — SEDIMENTATION RATE: SED RATE: 17 mm/h (ref 0–22)

## 2014-10-10 NOTE — Progress Notes (Signed)
Pre visit review using our clinic review tool, if applicable. No additional management support is needed unless otherwise documented below in the visit note. 

## 2014-10-10 NOTE — Patient Instructions (Addendum)
Flu shot at work. labwork today. EKG looking ok today. Chest xray today. We will see what Dr Loretta Plume thinks about plavix but I think ok to proceed with surgery.

## 2014-10-10 NOTE — Progress Notes (Signed)
BP 136/78 mmHg  Pulse 68  Temp(Src) 97.7 F (36.5 C) (Oral)  Wt 130 lb 4 oz (59.081 kg)   CC: preop eval  Subjective:    Patient ID: Marilyn Cook, female    DOB: [birth year], 69 y.o.   MRN: [id number]  HPI: Marilyn Cook is a 69 y.o. female presenting on 10/10/2014 for Surgical Clearance   Upcoming surgery by Dr Lorin Mercy for arthroscopic debridement and mini open rotator cuff repair of R shoulder for chronic impingement and rotator cuff tear by MRI. Low bleeding risk.  Earlier this year she did have concerning symptoms for TIA (L cheek and hand paresthesias and pressure headache - so was started on plavix. MRI without acute finding, 2D echo ok, carotid US ok (1-39% stenosis bilaterally), labs ok. She has upcoming f/u appointment with Dr Loretta Plume 10/19/2014. Strong fmhx CVA.   Aspirin allergy, as well as to latex, epinephrine, penicillin, sulfa drugs and diphenhydramine and eggs/dairy products.  Sedentary lifestyle. Continues working at [employer] part time at [employer].  Has tolerated GETA well in the past.  Denies fevers/chills, coughing, chest pain, tightness, dyspnea, palpitations or headaches. No dizziness.   Relevant past medical, surgical, family and social history reviewed and updated as indicated. Interim medical history since our last visit reviewed. Allergies and medications reviewed and updated. Current Outpatient Prescriptions on File Prior to Visit  Medication Sig  . Calcium Carb-Cholecalciferol (CALCIUM-VITAMIN D) 600-400 MG-UNIT TABS Take 1 tablet by mouth 2 (two) times daily.   . cetirizine (ZYRTEC) 10 MG tablet Take 10 mg by mouth daily.  . Cinnamon 500 MG capsule Take 1,000 mg by mouth 2 (two) times daily.   . clopidogrel (PLAVIX) 75 MG tablet Take 1 tablet (75 mg total) by mouth daily.  [name] CRANBERRY PO Take 1 tablet by mouth daily.   . cyanocobalamin 100 MCG tablet Take 100 mcg by mouth daily.    . hydrocortisone 2.5 % cream Apply 1 application topically 2 (two) times  daily as needed (itching).   . Misc Natural Products (BLACK CHERRY CONCENTRATE PO) Take 1 tablet by mouth daily.   . Potassium Chloride (K-TABS PO) Take 1 tablet by mouth daily.   . pravastatin (PRAVACHOL) 40 MG tablet Take one tablet once daily.  . Pyridoxine HCl (VITAMIN B-6 PO) Take 1 tablet by mouth daily.  [name] triamcinolone (KENALOG) 0.147 MG/GM topical spray Apply topically 2 (two) times daily as needed. For psoriasis  . VITAMIN E COMPLEX PO Take 1 capsule by mouth daily.  [name] co-enzyme Q-10 30 MG capsule Take 30 mg by mouth daily.   No current facility-administered medications on file prior to visit.   Past Medical History  Diagnosis Date  . Hyperlipemia   . NSVD (normal spontaneous vaginal delivery)     x 1  . Benign tumor of breast 1975  . Scalp psoriasis   . Osteopenia 08/2012    spine WNL, hip T -1.2  . TIA (transient ischemic attack) 06/08/2014    Past Surgical History  Procedure Laterality Date  . Partial hysterectomy  1987    due to fibroids, ovaries remain  . R lat epicondyle injections  1998  . Spiral ct  04/02    (-)  . Dexa  08/2012    spine WNL, hip T -1.2   Family History  Problem Relation Age of Onset  . Heart disease Father     MI   . Stroke Father     CVA  . Stroke Mother  CVA  . Hypertension Sister   . Cancer Sister     ovarian and colon cancer  . Aneurysm Brother   . Hypertension Brother   . Cancer Paternal Aunt     ? colon  . Cancer Paternal Grandfather     liver, alcohol  . Alcohol abuse Paternal Grandfather   . Hypertension Brother   . Cancer Other     prostate  . Cancer Maternal Aunt     breast    Social History  Substance Use Topics  . Smoking status: Never Smoker   . Smokeless tobacco: Never Used  . Alcohol Use: No    Review of Systems Per HPI unless specifically indicated above     Objective:    BP 136/78 mmHg  Pulse 68  Temp(Src) 97.7 F (36.5 C) (Oral)  Wt 130 lb 4 oz (59.081 kg)  Wt Readings from Last 3 Encounters:   10/10/14 130 lb 4 oz (59.081 kg)  07/07/14 135 lb 12.8 oz (61.598 kg)  07/05/14 135 lb (61.236 kg)   Body mass index is 23.82 kg/(m^2).  Physical Exam  Constitutional: She appears well-developed and well-nourished. No distress.  HENT:  Mouth/Throat: Oropharynx is clear and moist. No oropharyngeal exudate.  Eyes: Conjunctivae and EOM are normal. Pupils are equal, round, and reactive to light. No scleral icterus.  Neck: Normal range of motion. Neck supple. No thyromegaly present.  Cardiovascular: Normal rate, regular rhythm, normal heart sounds and intact distal pulses.   No murmur heard. Pulmonary/Chest: Effort normal and breath sounds normal. No respiratory distress. She has no wheezes. She has no rales.  Abdominal: Soft. Bowel sounds are normal.  Musculoskeletal: She exhibits no edema.  Lymphadenopathy:    She has no cervical adenopathy.  Skin: Skin is warm and dry. No rash noted.  Psychiatric: She has a normal mood and affect.  Nursing note and vitals reviewed.  Results for orders placed or performed in visit on 10/10/14  CBC with Differential/Platelet  Result Value Ref Range   WBC 4.6 4.0 - 10.5 K/uL   RBC 4.27 3.87 - 5.11 Mil/uL   Hemoglobin 14.0 12.0 - 15.0 g/dL   HCT 41.6 36.0 - 46.0 %   MCV 97.5 78.0 - 100.0 fl   MCHC 33.5 30.0 - 36.0 g/dL   RDW 13.1 11.5 - 15.5 %   Platelets 206.0 150.0 - 400.0 K/uL   Neutrophils Relative % 61.2 43.0 - 77.0 %   Lymphocytes Relative 26.7 12.0 - 46.0 %   Monocytes Relative 10.2 3.0 - 12.0 %   Eosinophils Relative 1.4 0.0 - 5.0 %   Basophils Relative 0.5 0.0 - 3.0 %   Neutro Abs 2.8 1.4 - 7.7 K/uL   Lymphs Abs 1.2 0.7 - 4.0 K/uL   Monocytes Absolute 0.5 0.1 - 1.0 K/uL   Eosinophils Absolute 0.1 0.0 - 0.7 K/uL   Basophils Absolute 0.0 0.0 - 0.1 K/uL  Basic metabolic panel  Result Value Ref Range   Sodium 141 135 - 145 mEq/L   Potassium 4.2 3.5 - 5.1 mEq/L   Chloride 105 96 - 112 mEq/L   CO2 30 19 - 32 mEq/L   Glucose, Bld 95 70 -  99 mg/dL   BUN 11 6 - 23 mg/dL   Creatinine, Ser 0.65 0.40 - 1.20 mg/dL   Calcium 9.2 8.4 - 10.5 mg/dL   GFR 95.77 >60.00 mL/min  Sedimentation rate  Result Value Ref Range   Sed Rate 17 0 - 22 mm/hr  Assessment & Plan:   Problem List Items Addressed This Visit    TIA (transient ischemic attack)   Preoperative clearance - Primary    Low bleeding risk procedure. On plavix for h/o recent TIA with stable MRI, has upcoming appt with neurology. I think it would be reasonable to stop 5d prior to procedure with recommencement day of procedure but I will appreciate neurology input as well. Advised pt check with Dr Loretta Plume at upcoming appointment 10/19/2014. Pending this decision, anticipate adequately low risk to proceed with surgery. Check CBC, BMP, ESR today along with EKG and CXR. EKG - NSR rate 75, normal axis, intervals, no acute ST/T changes.      Relevant Orders   EKG 12-Lead (Completed)   CBC with Differential/Platelet (Completed)   Basic metabolic panel (Completed)   Sedimentation rate (Completed)   DG Chest 2 View (Completed)   Loss of weight    ~30lb weight loss + fatigue noted over last 3 years. Recent labwork unrevealing including iFOB, UA, CA125 (04/2014) - checked due to fmhx ovarian cancer. Normal mammogram 03/2014. Recheck ESR today, check CXR. Overall asxs. I have asked her to return in 3 mo for CPE, sooner if persistent weight loss noted (in setting of upcoming shoulder surgery).      Relevant Orders   Sedimentation rate (Completed)   Chronic right shoulder pain    Pending upcoming surgery by Dr Lorin Mercy for impingement with rotator cuff.          Follow up plan: Return in about 3 months (around 01/10/2015), or as needed, for annual exam, prior fasting for blood work.

## 2014-10-11 ENCOUNTER — Other Ambulatory Visit (HOSPITAL_BASED_OUTPATIENT_CLINIC_OR_DEPARTMENT_OTHER): Payer: Self-pay | Admitting: Orthopaedic Surgery

## 2014-10-11 DIAGNOSIS — Z01818 Encounter for other preprocedural examination: Secondary | ICD-10-CM | POA: Insufficient documentation

## 2014-10-11 NOTE — Assessment & Plan Note (Addendum)
Low bleeding risk procedure. On plavix for h/o recent TIA with stable MRI, has upcoming appt with neurology. I think it would be reasonable to stop 5d prior to procedure with recommencement day of procedure but I will appreciate neurology input as well. Advised pt check with Dr Jaffee at upcoming appointment 10/19/2014. Pending this decision, anticipate adequately low risk to proceed with surgery. Check CBC, BMP, ESR today along with EKG and CXR. EKG - NSR rate 75, normal axis, intervals, no acute ST/T changes. 

## 2014-10-14 DIAGNOSIS — M25511 Pain in right shoulder: Secondary | ICD-10-CM

## 2014-10-14 DIAGNOSIS — G8929 Other chronic pain: Secondary | ICD-10-CM

## 2014-10-14 HISTORY — DX: Other chronic pain: G89.29

## 2014-10-14 NOTE — Assessment & Plan Note (Addendum)
~  30lb weight loss + fatigue noted over last 3 years. Recent labwork unrevealing including iFOB, UA, CA125 (04/2014) - checked due to fmhx ovarian cancer. Normal mammogram 03/2014. Recheck ESR today, check CXR. Overall asxs. I have asked her to return in 3 mo for CPE, sooner if persistent weight loss noted (in setting of upcoming shoulder surgery).

## 2014-10-14 NOTE — Assessment & Plan Note (Signed)
Pending upcoming surgery by Dr Lorin Mercy for impingement with rotator cuff.

## 2014-10-16 ENCOUNTER — Encounter: Payer: Self-pay | Admitting: *Deleted

## 2014-10-19 ENCOUNTER — Ambulatory Visit (INDEPENDENT_AMBULATORY_CARE_PROVIDER_SITE_OTHER): Payer: 59 | Admitting: Neurology

## 2014-10-19 ENCOUNTER — Encounter: Payer: Self-pay | Admitting: Neurology

## 2014-10-19 VITALS — BP 120/80 | Ht 62.0 in | Wt 130.0 lb

## 2014-10-19 DIAGNOSIS — G43109 Migraine with aura, not intractable, without status migrainosus: Secondary | ICD-10-CM

## 2014-10-19 DIAGNOSIS — G459 Transient cerebral ischemic attack, unspecified: Secondary | ICD-10-CM

## 2014-10-19 DIAGNOSIS — R202 Paresthesia of skin: Secondary | ICD-10-CM

## 2014-10-19 NOTE — Progress Notes (Signed)
NEUROLOGY FOLLOW UP OFFICE NOTE  Marilyn Cook [id number]  HISTORY OF PRESENT ILLNESS:  Marilyn Cook is a 69 year old right-handed woman who follows up for possible TIA.  Ophthalmology report, carotid doppler report, echo report and labs reviewed.  UPDATE: She saw Dr. Kathrin Penner of [facility], regarding seeing floaters, which the exam was overall unremarkable.  Fundi were normal.  Retinal exam did not reveal emboli.      She underwent a TIA workup.  Carotid doppler showed 1-39% bilateral ICA stenosis.  2D echo with bubble study showed LVEF of 55-60% with no PFO or atrial septal defect.  LDL was 82 and Hgb A1c was 5.9.  Due to aspirin allergy, she was instead started on Plavix.  She is scheduled for shoulder surgery later in the month.  Her surgeon, Dr. Lorin Mercy, requested clearance and when Plavix should be discontinued. Of note, sometimes when she wakes up in bed, she notes tingling sensation in her hands.  HISTORY: In May, she developed sudden onset numbness of her lips, which spread to include the left side of her face.  This subsequently spread to the palmar aspect of her left hand.  She then developed a stinging pain in her left thumb.  Symptoms lasted at least an hour but she still had residual symptoms involving the thumb afterwards.  The next day, she still noticed the stinging sensation in her thumb.  She saw her PCP.  An MRI of the brain was ordered, which was not able to be scheduled for about two weeks.  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  On 06/26/14, she developed a pressure-like sensation in her head.  She then developed numbness and tingling in the left leg from the knee down to the ankle.  It lasted only several minutes and resolved.  She was evaluated at the ED.  She had another episode, this time involving the left arm, lasting only a few minutes.  Labs were  unremarkable except for mildly elevated glucose of 107.  She was subsequently sent home.  Since the initial event, she sometimes sees floaters, particularly if she closes her eyes.  One time, she noted visual disturbance in the left half of her visual field.  Another time, she reported seeing a scotoma moving around.  She denies history of migraine or stroke.  She reports allergy to ASA (she does not remember her symptoms).  PAST MEDICAL HISTORY: Past Medical History  Diagnosis Date  . Hyperlipemia   . NSVD (normal spontaneous vaginal delivery)     x 1  . Benign tumor of breast 1975  . Scalp psoriasis   . Osteopenia 08/2012    spine WNL, hip T -1.2  . TIA (transient ischemic attack) 06/08/2014    MEDICATIONS: Current Outpatient Prescriptions on File Prior to Visit  Medication Sig Dispense Refill  . Calcium Carb-Cholecalciferol (CALCIUM-VITAMIN D) 600-400 MG-UNIT TABS Take 1 tablet by mouth 2 (two) times daily.     . cetirizine (ZYRTEC) 10 MG tablet Take 10 mg by mouth daily.    . Cinnamon 500 MG capsule Take 1,000 mg by mouth 2 (two) times daily.     . clopidogrel (PLAVIX) 75 MG tablet Take 1 tablet (75 mg total) by mouth daily. 30 tablet 6  . co-enzyme Q-10 30 MG capsule Take 30 mg by mouth daily.    [name] CRANBERRY PO Take 1 tablet by mouth daily.     [name]  cyanocobalamin 100 MCG tablet Take 100 mcg by mouth daily.      . hydrocortisone 2.5 % cream Apply 1 application topically 2 (two) times daily as needed (itching).     . Misc Natural Products (BLACK CHERRY CONCENTRATE PO) Take 1 tablet by mouth daily.     . Potassium Chloride (K-TABS PO) Take 1 tablet by mouth daily.     . pravastatin (PRAVACHOL) 40 MG tablet Take one tablet once daily. 90 tablet 1  . Pyridoxine HCl (VITAMIN B-6 PO) Take 1 tablet by mouth daily.    [name] triamcinolone (KENALOG) 0.147 MG/GM topical spray Apply topically 2 (two) times daily as needed. For psoriasis 63 g 1  . VITAMIN E COMPLEX PO Take 1 capsule by mouth daily.       No current facility-administered medications on file prior to visit.    ALLERGIES: Allergies  Allergen Reactions  . Aspirin   . Diphenhydramine Hcl   . Eggs Or Egg-Derived Products Hives and Swelling  . Epinephrine     REACTION: UNSPECIFIED  . Latex     REACTION: swelling  . Other Other (See Comments)    GI upset, headache to DAIRY PRODUCTS  . Penicillins   . Sulfasalazine     FAMILY HISTORY: Family History  Problem Relation Age of Onset  . Heart disease Father     MI   . Stroke Father     CVA  . Stroke Mother     CVA  . Hypertension Sister   . Cancer Sister     ovarian and colon cancer  . Aneurysm Brother   . Hypertension Brother   . Cancer Paternal Aunt     ? colon  . Cancer Paternal Grandfather     liver, alcohol  . Alcohol abuse Paternal Grandfather   . Hypertension Brother   . Cancer Other     prostate  . Cancer Maternal Aunt     breast    SOCIAL HISTORY: Social History   Social History  . Marital Status: Married    Spouse Name: N/A  . Number of Children: 1  . Years of Education: N/A   Occupational History  . [employer]    new hires   Social History Main Topics  . Smoking status: Never Smoker   . Smokeless tobacco: Never Used  . Alcohol Use: No  . Drug Use: No  . Sexual Activity: Yes   Other Topics Concern  . Not on file   Social History Narrative   Married and lives with husband   One son   Occupation: works at [employer] at [employer]   Activity: no regular exercise   Diet: some water, fruits/vegetables daily      Advanced directives: discussed - HCPOA unsure will consider this. Needs to set up.    REVIEW OF SYSTEMS: Constitutional: No fevers, chills, or sweats, no generalized fatigue, change in appetite Eyes: No visual changes, double vision, eye pain Ear, nose and throat: No hearing loss, ear pain, nasal congestion, sore throat Cardiovascular: No chest pain, palpitations Respiratory:  No shortness of breath at rest or with  exertion, wheezes GastrointestinaI: No nausea, vomiting, diarrhea, abdominal pain, fecal incontinence Genitourinary:  No dysuria, urinary retention or frequency Musculoskeletal:  No neck pain, back pain Integumentary: No rash, pruritus, skin lesions Neurological: as above Psychiatric: No depression, insomnia, anxiety Endocrine: No palpitations, fatigue, diaphoresis, mood swings, change in appetite, change in weight, increased thirst Hematologic/Lymphatic:  No anemia, purpura, petechiae. Allergic/Immunologic: no  itchy/runny eyes, nasal congestion, recent allergic reactions, rashes  PHYSICAL EXAM: Filed Vitals:   10/19/14 0746  BP: 120/80   General: No acute distress.  Patient appears well-groomed.  normal body habitus. Head:  Normocephalic/atraumatic Eyes:  Fundoscopic exam unremarkable without vessel changes, exudates, hemorrhages or papilledema. Neck: supple, no paraspinal tenderness, full range of motion Heart:  Regular rate and rhythm Lungs:  Clear to auscultation bilaterally Back: No paraspinal tenderness Neurological Exam: alert and oriented to person, place, and time. Attention span and concentration intact, recent and remote memory intact, fund of knowledge intact.  Speech fluent and not dysarthric, language intact.  CN II-XII intact. Fundoscopic exam unremarkable without vessel changes, exudates, hemorrhages or papilledema.  Bulk and tone normal, muscle strength 5/5 throughout.  Sensation to light touch, temperature and vibration intact.  Deep tendon reflexes 2+ throughout, toes downgoing.  Finger to nose and heel to shin testing intact.  Gait normal.  IMPRESSION: 1.  Recurrent left sided numbness, occuring in a span of a couple of months in June.  Given the new separate visual auras, migraine is highly suspected.  However, I cannot rule out TIA, therefore I would recommend continuing antiplatelet therapy.  If she should have any recurrent episode at this point, months after the  initial episodes, then I would feel more confident that these were migraine. 2.  Recurrent visual aura, probably migraine. 3.  Hand numbness and tingling.   PLAN: 1.  I would continue Plavix.  For surgery, Plavix may be discontinued 5 days prior to surgery and restarted the following day after surgery (assuming there is no contraindication).  She was informed that stopping Plavix does increase risk for stroke 2.  Consider wrist splints at night. 3.  Continue pravastatin (LDL at goal of less than 100) 4.  Follow up in 3 months.  Metta Clines, DO  CC:  Ria Bush, MD

## 2014-10-19 NOTE — Patient Instructions (Signed)
As I sent to Dr. Lorin Mercy, plavix should be discontinued 5 days prior to surgery and can be restarted the following day after surgery (assuming that there aren't any complications).  Let it be known that stopping a blood thinner does increase risk for stroke. Try wrist splints to wear at bedtime to see if you do not have any tingling in the hands Follow up in 3 months.

## 2014-10-23 ENCOUNTER — Encounter (HOSPITAL_BASED_OUTPATIENT_CLINIC_OR_DEPARTMENT_OTHER): Payer: Self-pay | Admitting: *Deleted

## 2014-10-27 ENCOUNTER — Encounter (HOSPITAL_BASED_OUTPATIENT_CLINIC_OR_DEPARTMENT_OTHER)
Admission: RE | Admit: 2014-10-27 | Discharge: 2014-10-27 | Disposition: A | Discharge status: Home / Self Care | Payer: 59 | Source: Ambulatory Visit | Attending: Orthopaedic Surgery | Admitting: Orthopaedic Surgery

## 2014-10-27 LAB — PROTIME-INR
INR: 1.26 (ref 0.00–1.49)
PROTHROMBIN TIME: 16 s — AB (ref 11.6–15.2)

## 2014-10-30 ENCOUNTER — Ambulatory Visit (HOSPITAL_BASED_OUTPATIENT_CLINIC_OR_DEPARTMENT_OTHER): Payer: 59 | Admitting: Anesthesiology

## 2014-10-30 ENCOUNTER — Encounter (HOSPITAL_BASED_OUTPATIENT_CLINIC_OR_DEPARTMENT_OTHER): Payer: Self-pay | Admitting: *Deleted

## 2014-10-30 ENCOUNTER — Ambulatory Visit (HOSPITAL_BASED_OUTPATIENT_CLINIC_OR_DEPARTMENT_OTHER)
Admission: RE | Admit: 2014-10-30 | Discharge: 2014-10-30 | Disposition: A | Discharge status: Home / Self Care | Payer: 59 | Source: Ambulatory Visit | Attending: Orthopaedic Surgery | Admitting: Orthopaedic Surgery

## 2014-10-30 ENCOUNTER — Encounter (HOSPITAL_BASED_OUTPATIENT_CLINIC_OR_DEPARTMENT_OTHER)
Admission: RE | Disposition: A | Discharge status: Home / Self Care | Payer: Self-pay | Source: Ambulatory Visit | Attending: Orthopaedic Surgery

## 2014-10-30 DIAGNOSIS — E785 Hyperlipidemia, unspecified: Secondary | ICD-10-CM | POA: Insufficient documentation

## 2014-10-30 DIAGNOSIS — S46211A Strain of muscle, fascia and tendon of other parts of biceps, right arm, initial encounter: Secondary | ICD-10-CM | POA: Insufficient documentation

## 2014-10-30 DIAGNOSIS — Z8673 Personal history of transient ischemic attack (TIA), and cerebral infarction without residual deficits: Secondary | ICD-10-CM | POA: Insufficient documentation

## 2014-10-30 DIAGNOSIS — M75121 Complete rotator cuff tear or rupture of right shoulder, not specified as traumatic: Secondary | ICD-10-CM | POA: Insufficient documentation

## 2014-10-30 DIAGNOSIS — X58XXXA Exposure to other specified factors, initial encounter: Secondary | ICD-10-CM | POA: Insufficient documentation

## 2014-10-30 DIAGNOSIS — Z7902 Long term (current) use of antithrombotics/antiplatelets: Secondary | ICD-10-CM | POA: Insufficient documentation

## 2014-10-30 DIAGNOSIS — G8929 Other chronic pain: Secondary | ICD-10-CM | POA: Insufficient documentation

## 2014-10-30 DIAGNOSIS — M25511 Pain in right shoulder: Secondary | ICD-10-CM | POA: Insufficient documentation

## 2014-10-30 HISTORY — DX: Cerebral infarction, unspecified: I63.9

## 2014-10-30 HISTORY — DX: Other complications of anesthesia, initial encounter: T88.59XA

## 2014-10-30 HISTORY — DX: Adverse effect of unspecified anesthetic, initial encounter: T41.45XA

## 2014-10-30 SURGERY — SHOULDER ARTHROSCOPY WITH ROTATOR CUFF REPAIR AND OPEN BICEPS TENODESIS
Anesthesia: Regional | Site: Shoulder | Laterality: Right

## 2014-10-30 MED ORDER — CLINDAMYCIN PHOSPHATE 900 MG/50ML IV SOLN
INTRAVENOUS | Status: AC
  Filled 2014-10-30: qty 50

## 2014-10-30 MED ORDER — PROPOFOL 10 MG/ML IV BOLUS
INTRAVENOUS | Status: DC | PRN
  Administered 2014-10-30: 100 mg via INTRAVENOUS

## 2014-10-30 MED ORDER — MIDAZOLAM HCL 2 MG/2ML IJ SOLN
1.0000 mg | INTRAMUSCULAR | Status: DC | PRN
  Administered 2014-10-30: 1 mg via INTRAVENOUS

## 2014-10-30 MED ORDER — FENTANYL CITRATE (PF) 100 MCG/2ML IJ SOLN
INTRAMUSCULAR | Status: AC
  Filled 2014-10-30: qty 4

## 2014-10-30 MED ORDER — SODIUM CHLORIDE 0.9 % IR SOLN
Status: DC | PRN
  Administered 2014-10-30: 1000 mL

## 2014-10-30 MED ORDER — CLINDAMYCIN PHOSPHATE 900 MG/50ML IV SOLN: 900.0000 mg | INTRAVENOUS | Status: DC

## 2014-10-30 MED ORDER — LIDOCAINE HCL (CARDIAC) 20 MG/ML IV SOLN
INTRAVENOUS | Status: DC | PRN
  Administered 2014-10-30: 100 mg via INTRAVENOUS
  Administered 2014-10-30: 50 mg via INTRAVENOUS

## 2014-10-30 MED ORDER — BUPIVACAINE-EPINEPHRINE (PF) 0.5% -1:200000 IJ SOLN
INTRAMUSCULAR | Status: DC | PRN
  Administered 2014-10-30: 23 mL via PERINEURAL

## 2014-10-30 MED ORDER — FENTANYL CITRATE (PF) 100 MCG/2ML IJ SOLN
INTRAMUSCULAR | Status: AC
  Filled 2014-10-30: qty 2

## 2014-10-30 MED ORDER — LIDOCAINE HCL (CARDIAC) 20 MG/ML IV SOLN
INTRAVENOUS | Status: AC
  Filled 2014-10-30: qty 5

## 2014-10-30 MED ORDER — DEXAMETHASONE SODIUM PHOSPHATE 4 MG/ML IJ SOLN
INTRAMUSCULAR | Status: DC | PRN
  Administered 2014-10-30: 10 mg via INTRAVENOUS

## 2014-10-30 MED ORDER — MEPERIDINE HCL 25 MG/ML IJ SOLN: 6.2500 mg | INTRAMUSCULAR | Status: DC | PRN

## 2014-10-30 MED ORDER — LACTATED RINGERS IV SOLN
INTRAVENOUS | Status: DC
  Administered 2014-10-30 (×2): via INTRAVENOUS

## 2014-10-30 MED ORDER — EPHEDRINE SULFATE 50 MG/ML IJ SOLN
INTRAMUSCULAR | Status: AC
  Filled 2014-10-30: qty 1

## 2014-10-30 MED ORDER — CHLORHEXIDINE GLUCONATE 4 % EX LIQD: 60.0000 mL | Freq: Once | CUTANEOUS | Status: DC

## 2014-10-30 MED ORDER — EPHEDRINE SULFATE 50 MG/ML IJ SOLN
INTRAMUSCULAR | Status: DC | PRN
  Administered 2014-10-30: 10 mg via INTRAVENOUS

## 2014-10-30 MED ORDER — HYDROMORPHONE HCL 1 MG/ML IJ SOLN: 0.2500 mg | INTRAMUSCULAR | Status: DC | PRN

## 2014-10-30 MED ORDER — SUCCINYLCHOLINE CHLORIDE 20 MG/ML IJ SOLN
INTRAMUSCULAR | Status: AC
  Filled 2014-10-30: qty 1

## 2014-10-30 MED ORDER — OXYCODONE HCL 5 MG PO TABS: 5.0000 mg | ORAL_TABLET | Freq: Once | ORAL | Status: DC | PRN

## 2014-10-30 MED ORDER — BUPIVACAINE-EPINEPHRINE (PF) 0.25% -1:200000 IJ SOLN
INTRAMUSCULAR | Status: DC | PRN
  Administered 2014-10-30: 10 mL via PERINEURAL

## 2014-10-30 MED ORDER — CLINDAMYCIN PHOSPHATE 900 MG/50ML IV SOLN
INTRAVENOUS | Status: DC | PRN
  Administered 2014-10-30 (×2): 900 mg via INTRAVENOUS

## 2014-10-30 MED ORDER — ONDANSETRON HCL 4 MG/2ML IJ SOLN
INTRAMUSCULAR | Status: AC
  Filled 2014-10-30: qty 2

## 2014-10-30 MED ORDER — GLYCOPYRROLATE 0.2 MG/ML IJ SOLN: 0.2000 mg | Freq: Once | INTRAMUSCULAR | Status: DC | PRN

## 2014-10-30 MED ORDER — SCOPOLAMINE 1 MG/3DAYS TD PT72: 1.0000 | MEDICATED_PATCH | Freq: Once | TRANSDERMAL | Status: DC | PRN

## 2014-10-30 MED ORDER — OXYCODONE HCL 5 MG/5ML PO SOLN: 5.0000 mg | Freq: Once | ORAL | Status: DC | PRN

## 2014-10-30 MED ORDER — MIDAZOLAM HCL 2 MG/2ML IJ SOLN
INTRAMUSCULAR | Status: AC
  Filled 2014-10-30: qty 2

## 2014-10-30 MED ORDER — FENTANYL CITRATE (PF) 100 MCG/2ML IJ SOLN
50.0000 ug | INTRAMUSCULAR | Status: DC | PRN
  Administered 2014-10-30: 100 ug via INTRAVENOUS
  Administered 2014-10-30: 50 ug via INTRAVENOUS

## 2014-10-30 MED ORDER — SUCCINYLCHOLINE CHLORIDE 20 MG/ML IJ SOLN
INTRAMUSCULAR | Status: DC | PRN
  Administered 2014-10-30: 100 mg via INTRAVENOUS

## 2014-10-30 MED ORDER — DEXAMETHASONE SODIUM PHOSPHATE 10 MG/ML IJ SOLN
INTRAMUSCULAR | Status: AC
  Filled 2014-10-30: qty 1

## 2014-10-30 SURGICAL SUPPLY — 48 items
APL SKNCLS STERI-STRIP NONHPOA (GAUZE/BANDAGES/DRESSINGS) ×2
BENZOIN TINCTURE PRP APPL 2/3 (GAUZE/BANDAGES/DRESSINGS) ×2 IMPLANT
BLADE 4.2CUDA (BLADE) ×3 IMPLANT
BLADE SURG 15 STRL LF DISP TIS (BLADE) ×1 IMPLANT
BLADE SURG 15 STRL SS (BLADE) ×3
CANNULA SHOULDER 7CM (CANNULA) ×3 IMPLANT
DRAPE STERI 35X30 U-POUCH (DRAPES) ×2 IMPLANT
DRAPE U-SHAPE 47X51 STRL (DRAPES) ×6 IMPLANT
DRAPE U-SHAPE 76X120 STRL (DRAPES) ×4 IMPLANT
DRSG PAD ABDOMINAL 8X10 ST (GAUZE/BANDAGES/DRESSINGS) ×5 IMPLANT
DURAPREP 26ML APPLICATOR (WOUND CARE) ×3 IMPLANT
ELECT REM PT RETURN 9FT ADLT (ELECTROSURGICAL) ×3
ELECTRODE REM PT RTRN 9FT ADLT (ELECTROSURGICAL) ×2 IMPLANT
GAUZE SPONGE 4X4 16PLY XRAY LF (GAUZE/BANDAGES/DRESSINGS) ×2 IMPLANT
GLOVE BIOGEL PI IND STRL 6.5 (GLOVE) ×3 IMPLANT
GLOVE BIOGEL PI IND STRL 8 (GLOVE) ×1 IMPLANT
GLOVE BIOGEL PI INDICATOR 6.5 (GLOVE) ×3
GLOVE BIOGEL PI INDICATOR 8 (GLOVE) ×1
GLOVE SURG SS PI 6.5 STRL IVOR (GLOVE) ×2 IMPLANT
GLOVE SURG SS PI 7.0 STRL IVOR (GLOVE) ×2 IMPLANT
GLOVE SURG SS PI 7.5 STRL IVOR (GLOVE) ×2 IMPLANT
GOWN STRL REUS W/ TWL LRG LVL3 (GOWN DISPOSABLE) ×3 IMPLANT
GOWN STRL REUS W/TWL LRG LVL3 (GOWN DISPOSABLE) ×6
KIT BIO-TENODESIS 3X8 DISP (MISCELLANEOUS) ×3
KIT INSRT BABSR STRL DISP BTN (MISCELLANEOUS) ×1 IMPLANT
MANIFOLD NEPTUNE II (INSTRUMENTS) ×3 IMPLANT
PACK ARTHROSCOPY DSU (CUSTOM PROCEDURE TRAY) ×3 IMPLANT
PACK BASIN DAY SURGERY FS (CUSTOM PROCEDURE TRAY) ×3 IMPLANT
PENCIL BUTTON HOLSTER BLD 10FT (ELECTRODE) ×3 IMPLANT
SCREW PEEK TENODESIS 7X23M (Screw) ×2 IMPLANT
SLING ARM IMMOBILIZER MED (SOFTGOODS) ×2 IMPLANT
STRIP CLOSURE SKIN 1/2X4 (GAUZE/BANDAGES/DRESSINGS) ×2 IMPLANT
SUCTION FRAZIER TIP 10 FR DISP (SUCTIONS) ×3 IMPLANT
SUT ETHILON 4 0 PS 2 18 (SUTURE) ×3 IMPLANT
SUT VIC AB 0 CT1 27 (SUTURE) ×3
SUT VIC AB 0 CT1 27XBRD ANBCTR (SUTURE) ×1 IMPLANT
SUT VIC AB 1 CT1 27 (SUTURE) ×3
SUT VIC AB 1 CT1 27XBRD ANBCTR (SUTURE) ×2 IMPLANT
SUT VIC AB 2-0 SH 27 (SUTURE) ×3
SUT VIC AB 2-0 SH 27XBRD (SUTURE) ×1 IMPLANT
SUT VICRYL 4-0 PS2 18IN ABS (SUTURE) ×3 IMPLANT
TOWEL OR 17X24 6PK STRL BLUE (TOWEL DISPOSABLE) ×3 IMPLANT
TUBE CONNECTING 20X1/4 (TUBING) ×3 IMPLANT
TUBING ARTHROSCOPY IRRIG 16FT (MISCELLANEOUS) ×3 IMPLANT
Ultrafix Anchor ×4 IMPLANT
WAND STAR VAC 90 (SURGICAL WAND) ×3 IMPLANT
WATER STERILE IRR 1000ML POUR (IV SOLUTION) ×3 IMPLANT
YANKAUER SUCT BULB TIP NO VENT (SUCTIONS) ×2 IMPLANT

## 2014-10-30 NOTE — Op Note (Signed)
Test test  Preop diagnosis:right shoulder supraspinatus tear with retraction. Postop diagnosis right shoulder superior labral tear biceps anchor partial tear full-thickness rotator cuff tear.  Procedure :diagnostic and operative arthroscopy right shoulder. Debridement of labrum undersurface rotator cuff biceps tendon. Open distal clavicle spur excision and acromioplasty biceps tenodesis and rotator cuff repair.  Surgeon : Rodell Perna M.D. anesthesia is preoperative scalene block plus LMA and Marcaine skin local.  EBL: Minimal  Anesthesia: LMA plus preoperative scalene block plus local  Procedure: After induction general anesthesia standard prepping draping with the patient beachchair position LMA tube placement for controlled airway preoperative antibiotics were given timeout procedure was completed or prep was used for prepping the usual split sheets drapes impervious stockinette Coban arthroscopic pouch was applied. Patient had persistent shoulder problems not responding conservative treatment MRI scan showed full-thickness rotator cuff tear involving the supraspinatus with 1 cm retraction and failed conservative treatment.  Scope was introduced from posterior approach anterior portal was made him for the biceps tendon. Full-thickness supraspinatus tear was visualized with retraction. Arthroscopic fluid leaked out through the hole. Anterior labrum was intact no Bankart lesion or Hill-Sachs lesion. Superior labrum showed fraying and partial tear of the biceps tendon and its anchor which was not visualized on the preoperative MRI scan. There was a superior labral tear with the detachment. Through the anterior portal shaver is introduced the labral tear was debrided undersurface rotator cuff was debrided with 4.2, shaver. Biceps was divided anteriorly with arthroscopic scissors off the labrum. Shoulder suctioned dry portals were closed with 4-0 nylon. Incision was made along the anterior aspect of the  acromion. Distal clavicle had a prominent spur the spurs removed with a small osteotome hammer and smoothed with the run sure and then file. Acromion showed a prominence for which was removed with three-quarter straight osteotome. Bursectomy was performed root retractor retracted rotator cuff tear was visualized. Shoulder was flexed rotated biceps tendon was hooked from medial to lateral with the right angle clamp delivered 22 mm resected and then the #2 FiberWire suture was placed in a Bonelli through the tendon exiting out to the tip. Longest end the the suture was placed through the screwdriver with the WNIO-27 Bio-Tenodesis screw attached. K wire was drilled into the bicipital groove overreamed with a 7 mm reamer and then the tendon pushed into the hole with the long screwdriver and then the screw inserted securing the tendon down into the hole. 2 ends of the suture were tied flipping the screw. I was able to reach full extension. Rotator cuff was cut back to 15 mm the tuberosity was run sure with the bare Ronjair to bleeding surface to UltraFix anchors were placed and sutures were placed through the rotator cuff pulling at directly back for repair. Some additional suture was placed laterally over the top securing it to the old stump. Shoulder was rotated cuff repair was complete. Deltoid is  Back to the acromion with holes in the acromion made with the UltraFix sutures. 2-0 Vicryl subtendinous tissue for Vicryl subcuticular closure tincture benzoin Steri-Strips Gadge Hermiz infiltration postoperative dressing 4 x 4's ABDs and tape and a shoulder immobilizer outpatient surgery is appropriate for treatment of this condition all follow-up 1 week.

## 2014-10-30 NOTE — Progress Notes (Signed)
Assisted Dr. Crews with right, ultrasound guided, interscalene  block. Side rails up, monitors on throughout procedure. See vital signs in flow sheet. Tolerated Procedure well. 

## 2014-10-30 NOTE — Discharge Summary (Signed)
  Patient went home after outpt surgery never admitted.

## 2014-10-30 NOTE — H&P (Signed)
Marilyn Cook is an 69 y.o. female.    A 69 year old white female with a history of right shoulder pain returns for review of her right shoulder MRI scan that was done recently.  The scan showed a type 2 acromion, mild to moderate AC degenerative changes.  She has a partial-width, full-thickness tear of the supraspinatus tendon with about 1 cm of retraction.  She continues to have ongoing shoulder pain with overhead activity and reaching behind her back.  Pain does bother her at night when she lies on her right side.  She is currently on Plavix prescribed by a neurologist, Dr. Tomi Likens.      Past Medical History  Diagnosis Date  . Hyperlipemia   . NSVD (normal spontaneous vaginal delivery)     x 1  . Benign tumor of breast 1975  . Scalp psoriasis   . Osteopenia 08/2012    spine WNL, hip T -1.2  . TIA (transient ischemic attack) 06/08/2014  . Complication of anesthesia     hard to wake up  . Stroke [facility])     possible TIA in 06-2014    Past Surgical History  Procedure Laterality Date  . Partial hysterectomy  1987    due to fibroids, ovaries remain  . R lat epicondyle injections  1998  . Spiral ct  04/02    (-)  . Dexa  08/2012    spine WNL, hip T -1.2  . Abdominal hysterectomy      Family History  Problem Relation Age of Onset  . Heart disease Father     MI   . Stroke Father     CVA  . Stroke Mother     CVA  . Hypertension Sister   . Cancer Sister     ovarian and colon cancer  . Aneurysm Brother   . Hypertension Brother   . Cancer Paternal Aunt     ? colon  . Cancer Paternal Grandfather     liver, alcohol  . Alcohol abuse Paternal Grandfather   . Hypertension Brother   . Cancer Other     prostate  . Cancer Maternal Aunt     breast   Social History:  reports that she has never smoked. She has never used smokeless tobacco. She reports that she does not drink alcohol or use illicit drugs.  Allergies:  Allergies  Allergen Reactions  . Aspirin   . Diphenhydramine Hcl    . Eggs Or Egg-Derived Products Hives and Swelling  . Epinephrine     REACTION: UNSPECIFIED  . Latex     REACTION: swelling  . Other Other (See Comments)    GI upset, headache to DAIRY PRODUCTS  . Penicillins   . Sulfasalazine     Medications Prior to Admission  Medication Sig Dispense Refill  . Calcium Carb-Cholecalciferol (CALCIUM-VITAMIN D) 600-400 MG-UNIT TABS Take 1 tablet by mouth 2 (two) times daily.     . cetirizine (ZYRTEC) 10 MG tablet Take 10 mg by mouth daily.    . Cinnamon 500 MG capsule Take 1,000 mg by mouth 2 (two) times daily.     [name] co-enzyme Q-10 30 MG capsule Take 30 mg by mouth daily.    [name] CRANBERRY PO Take 1 tablet by mouth daily.     . cyanocobalamin 100 MCG tablet Take 100 mcg by mouth daily.      . hydrocortisone 2.5 % cream Apply 1 application topically 2 (two) times daily as needed (itching).     [name]  Misc Natural Products (BLACK CHERRY CONCENTRATE PO) Take 1 tablet by mouth daily.     . Potassium Chloride (K-TABS PO) Take 1 tablet by mouth daily.     . pravastatin (PRAVACHOL) 40 MG tablet Take one tablet once daily. 90 tablet 1  . Pyridoxine HCl (VITAMIN B-6 PO) Take 1 tablet by mouth daily.    [name] triamcinolone (KENALOG) 0.147 MG/GM topical spray Apply topically 2 (two) times daily as needed. For psoriasis 63 g 1  . VITAMIN E COMPLEX PO Take 1 capsule by mouth daily.    . clopidogrel (PLAVIX) 75 MG tablet Take 1 tablet (75 mg total) by mouth daily. 30 tablet 6    No results found for this or any previous visit (from the past 48 hour(s)). No results found.  Review of Systems  Constitutional: Negative.   HENT: Negative.   Respiratory: Negative.   Cardiovascular: Negative.   Gastrointestinal: Negative.   Psychiatric/Behavioral: Negative.     Height 5\' 2"  (1.575 m), weight 58.968 kg (130 lb). Physical Exam  Constitutional: She is oriented to person, place, and time. No distress.  HENT:  Head: Atraumatic.  Eyes: EOM are normal.  Neck: Normal range of  motion.  Cardiovascular: Normal rate.   Respiratory: Effort normal.  GI: She exhibits no distension.  Neurological: She is alert and oriented to person, place, and time.  Skin: Skin is warm and dry.  Psychiatric: She has a normal mood and affect.    PHYSICAL EXAMINATION:  A very pleasant white female alert and oriented x3 and in no acute distress.  Head is normocephalic, atraumatic.  Extraocular movements intact.  Abdomen round, nondistended.  No respiratory distress.  Right Shoulder:  Unchanged from previous exam.  Skin warm and dry.   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   PLAN:  MRI was also reviewed with the patient and her husband who was present.  Advised that the best treatment option at this point would be arthroscopic debridement with mini open rotator cuff repair and possible SAD, DCE.  Surgical procedure along with potential rehab/recovery time discussed in detail.  All questions answered.  Reviewed a shoulder model with them today.  We will need to get preop medical clearance since she is currently on Plavix.  All questions answered. OWENS,JAMES M 10/30/2014, 7:12 AM

## 2014-10-30 NOTE — Interval H&P Note (Signed)
History and Physical Interval Note:  10/30/2014 8:35 AM  Marilyn Cook  has presented today for surgery, with the diagnosis of Right Shoulder Impingement, Rotator Cuff Tear  The various methods of treatment have been discussed with the patient and family. After consideration of risks, benefits and other options for treatment, the patient has consented to  Procedure(s): RIGHT SHOULDER ARTHROSCOPY WITH DEBRIDEMENT AND MINI OPEN ROTATOR CUFF REPAIR (Right) as a surgical intervention .  The patient's history has been reviewed, patient examined, no change in status, stable for surgery.  I have reviewed the patient's chart and labs.  Questions were answered to the patient's satisfaction.     Kody Vigil C

## 2014-10-30 NOTE — Transfer of Care (Signed)
Immediate Anesthesia Transfer of Care Note  Patient: Marilyn Cook  Procedure(s) Performed: Procedure(s): RIGHT SHOULDER ARTHROSCOPY WITH DEBRIDEMENT LABRAL TEAR, OPEN DISTAL CLAVICLE EXCISION, BICEP TENDONESIS, ROTATOR CUFF REPAIR (Right)  Patient Location: PACU  Anesthesia Type:GA combined with regional for post-op pain  Level of Consciousness: awake, alert  and oriented  Airway & Oxygen Therapy: Patient Spontanous Breathing and Patient connected to face mask oxygen  Post-op Assessment: Report given to RN and Post -op Vital signs reviewed and stable  Post vital signs: Reviewed and stable  Last Vitals:  Filed Vitals:   10/30/14 0835  BP: 118/63  Pulse: 85  Temp:   Resp: 18    Complications: No apparent anesthesia complications

## 2014-10-30 NOTE — Anesthesia Preprocedure Evaluation (Addendum)
Anesthesia Evaluation  Patient identified by MRN, date of birth, ID band Patient awake    Reviewed: Allergy & Precautions, NPO status , Patient's Chart, lab work & pertinent test results  Airway Mallampati: I  TM Distance: >3 FB Neck ROM: Full    Dental  (+) Teeth Intact, Dental Advisory Given   Pulmonary    breath sounds clear to auscultation       Cardiovascular  Rhythm:Regular Rate:Normal     Neuro/Psych    GI/Hepatic   Endo/Other    Renal/GU      Musculoskeletal   Abdominal   Peds  Hematology   Anesthesia Other Findings   Reproductive/Obstetrics                             Anesthesia Physical Anesthesia Plan  ASA: III  Anesthesia Plan: General and Regional   Post-op Pain Management:    Induction: Intravenous  Airway Management Planned: Oral ETT  Additional Equipment:   Intra-op Plan:   Post-operative Plan: Extubation in OR  Informed Consent: I have reviewed the patients History and Physical, chart, labs and discussed the procedure including the risks, benefits and alternatives for the proposed anesthesia with the patient or authorized representative who has indicated his/her understanding and acceptance.   Dental advisory given  Plan Discussed with: CRNA, Surgeon and Anesthesiologist  Anesthesia Plan Comments:         Anesthesia Quick Evaluation

## 2014-10-30 NOTE — Anesthesia Procedure Notes (Addendum)
Anesthesia Regional Block:  Interscalene brachial plexus block  Pre-Anesthetic Checklist: ,, timeout performed, Correct Patient, Correct Site, Correct Laterality, Correct Procedure, Correct Position, site marked, Risks and benefits discussed,  Surgical consent,  Pre-op evaluation,  At surgeon's request and post-op pain management  Laterality: Right and Upper  Prep: chloraprep       Needles:  Injection technique: Single-shot  Needle Type: Echogenic Needle     Needle Length: 5cm 5 cm Needle Gauge: 21 and 21 G    Additional Needles:  Procedures: ultrasound guided (picture in chart) Interscalene brachial plexus block Narrative:  Start time: 10/30/2014 8:13 AM End time: 10/30/2014 8:18 AM Injection made incrementally with aspirations every 5 mL.  Performed by: Personally  Anesthesiologist: CREWS, DAVID   Procedure Name: Intubation Date/Time: 10/30/2014 9:01 AM Performed by: Melynda Ripple D Pre-anesthesia Checklist: Patient identified, Emergency Drugs available, Suction available and Patient being monitored Patient Re-evaluated:Patient Re-evaluated prior to inductionOxygen Delivery Method: Circle System Utilized Preoxygenation: Pre-oxygenation with 100% oxygen Intubation Type: IV induction Ventilation: Mask ventilation without difficulty Laryngoscope Size: Mac and 3 Grade View: Grade II Tube type: Oral Number of attempts: 1 Airway Equipment and Method: Stylet and Oral airway Placement Confirmation: ETT inserted through vocal cords under direct vision,  positive ETCO2 and breath sounds checked- equal and bilateral Secured at: 22 cm Tube secured with: Tape Dental Injury: Teeth and Oropharynx as per pre-operative assessment       Right ISB image

## 2014-10-30 NOTE — Anesthesia Postprocedure Evaluation (Signed)
  Anesthesia Post-op Note  Patient: Marilyn Cook  Procedure(s) Performed: Procedure(s): RIGHT SHOULDER ARTHROSCOPY WITH DEBRIDEMENT LABRAL TEAR, OPEN DISTAL CLAVICLE EXCISION, BICEP TENDONESIS, ROTATOR CUFF REPAIR (Right)  Patient Location: PACU  Anesthesia Type: General, Regional   Level of Consciousness: awake, alert  and oriented  Airway and Oxygen Therapy: Patient Spontanous Breathing  Post-op Pain: none  Post-op Assessment: Post-op Vital signs reviewed  Post-op Vital Signs: Reviewed  Last Vitals:  Filed Vitals:   10/30/14 1045  BP: 121/63  Pulse: 89  Temp:   Resp: 14    Complications: No apparent anesthesia complications

## 2014-10-30 NOTE — Discharge Instructions (Addendum)
Ok to shower in 48 hrs, remove dressing , shower, dry with towel and appy large band aid to incision on top of shoulder and small band aids to single suture front and back of shoulder. Reapply shoulder immobilizer. Keep arm across chest with showering.  See dr Lorin Mercy in one week . Use the percocet you have at home  ,   One or two tablets every 4 hrs as needed for pain.   Ice to shoulder to reduce pain and swelling.  Resume Plavix tomorrow.   Post Anesthesia Home Care Instructions  Activity: Get plenty of rest for the remainder of the day. A responsible adult should stay with you for 24 hours following the procedure.  For the next 24 hours, DO NOT: -Drive a car -Paediatric nurse -Drink alcoholic beverages -Take any medication unless instructed by your physician -Make any legal decisions or sign important papers.  Meals: Start with liquid foods such as gelatin or soup. Progress to regular foods as tolerated. Avoid greasy, spicy, heavy foods. If nausea and/or vomiting occur, drink only clear liquids until the nausea and/or vomiting subsides. Call your physician if vomiting continues.  Special Instructions/Symptoms: Your throat may feel dry or sore from the anesthesia or the breathing tube placed in your throat during surgery. If this causes discomfort, gargle with warm salt water. The discomfort should disappear within 24 hours.  If you had a scopolamine patch placed behind your ear for the management of post- operative nausea and/or vomiting:  1. The medication in the patch is effective for 72 hours, after which it should be removed.  Wrap patch in a tissue and discard in the trash. Wash hands thoroughly with soap and water. 2. You may remove the patch earlier than 72 hours if you experience unpleasant side effects which may include dry mouth, dizziness or visual disturbances. 3. Avoid touching the patch. Wash your hands with soap and water after contact with the patch.     Regional  Anesthesia Blocks  1. Numbness or the inability to move the "blocked" extremity may last from 3-48 hours after placement. The length of time depends on the medication injected and your individual response to the medication. If the numbness is not going away after 48 hours, call your surgeon.  2. The extremity that is blocked will need to be protected until the numbness is gone and the  Strength has returned. Because you cannot feel it, you will need to take extra care to avoid injury. Because it may be weak, you may have difficulty moving it or using it. You may not know what position it is in without looking at it while the block is in effect.  3. For blocks in the legs and feet, returning to weight bearing and walking needs to be done carefully. You will need to wait until the numbness is entirely gone and the strength has returned. You should be able to move your leg and foot normally before you try and bear weight or walk. You will need someone to be with you when you first try to ensure you do not fall and possibly risk injury.  4. Bruising and tenderness at the needle site are common side effects and will resolve in a few days.  5. Persistent numbness or new problems with movement should be communicated to the surgeon or the [place] [phone] [place] [phone]).  Call your surgeon if you experience:   1.  Fever over 101.0. 2.  Inability  to urinate. 3.  Nausea and/or vomiting. 4.  Extreme swelling or bruising at the surgical site. 5.  Continued bleeding from the incision. 6.  Increased pain, redness or drainage from the incision. 7.  Problems related to your pain medication. 8. Any change in color, movement and/or sensation 9. Any problems and/or concerns

## 2014-10-30 NOTE — Brief Op Note (Signed)
10/30/2014  10:30 AM  PATIENT:  Marilyn Cook  69 y.o. female  PRE-OPERATIVE DIAGNOSIS:  Right Shoulder Impingement, Rotator Cuff Tear  POST-OPERATIVE DIAGNOSIS:  Right Shoulder Impingement, Rotator Cuff Tear  PROCEDURE:  Procedure(s): RIGHT SHOULDER ARTHROSCOPY WITH DEBRIDEMENT LABRAL TEAR, OPEN DISTAL CLAVICLE EXCISION, BICEP TENDONESIS, ROTATOR CUFF REPAIR (Right)  SURGEON:  Surgeon(s) and Role:    * Marybelle Killings, MD - Primary  PHYSICIAN ASSISTANT:   ASSISTANTS: none   ANESTHESIA:   local, regional and general  EBL:  Total I/O In: 1000 [I.V.:1000] Out: -   BLOOD ADMINISTERED:none  DRAINS: none   LOCAL MEDICATIONS USED:  MARCAINE     SPECIMEN:  No Specimen  DISPOSITION OF SPECIMEN:  N/A  COUNTS:  YES  TOURNIQUET:  * No tourniquets in log *  DICTATION: .Dragon Dictation  PLAN OF CARE: Discharge to home after PACU  PATIENT DISPOSITION:  PACU - hemodynamically stable.   Delay start of Pharmacological VTE agent (>24hrs) due to surgical blood loss or risk of bleeding: yes

## 2014-11-07 HISTORY — PX: SHOULDER ARTHROSCOPY: SHX128

## 2014-11-14 ENCOUNTER — Ambulatory Visit: Payer: 59 | Attending: Orthopaedic Surgery

## 2014-11-14 DIAGNOSIS — M25621 Stiffness of right elbow, not elsewhere classified: Secondary | ICD-10-CM | POA: Diagnosis present

## 2014-11-14 DIAGNOSIS — M25511 Pain in right shoulder: Secondary | ICD-10-CM

## 2014-11-14 DIAGNOSIS — R6889 Other general symptoms and signs: Secondary | ICD-10-CM | POA: Diagnosis present

## 2014-11-14 DIAGNOSIS — R29898 Other symptoms and signs involving the musculoskeletal system: Secondary | ICD-10-CM | POA: Diagnosis present

## 2014-11-14 NOTE — Therapy (Signed)
[place], [place], [address] Phone: [phone]   Fax:  [phone]  Physical Therapy Evaluation  Patient Details  Name: Marilyn Cook MRN: [id number] Date of Birth: 1945/04/26 Referring Provider: Rodell Perna , MD  Encounter Date: 11/14/2014      PT End of Session - 11/14/14 1316    Visit Number 1   Number of Visits 16   Date for PT Re-Evaluation 01/09/15   PT Start Time 1230   PT Stop Time 1315   PT Time Calculation (min) 45 min   Activity Tolerance Patient tolerated treatment well   Behavior During Therapy [facility] for tasks assessed/performed      Past Medical History  Diagnosis Date  . Hyperlipemia   . NSVD (normal spontaneous vaginal delivery)     x 1  . Benign tumor of breast 1975  . Scalp psoriasis   . Osteopenia 08/2012    spine WNL, hip T -1.2  . TIA (transient ischemic attack) 06/08/2014  . Complication of anesthesia     hard to wake up  . Stroke [facility])     possible TIA in 06-2014    Past Surgical History  Procedure Laterality Date  . Partial hysterectomy  1987    due to fibroids, ovaries remain  . R lat epicondyle injections  1998  . Spiral ct  04/02    (-)  . Dexa  08/2012    spine WNL, hip T -1.2  . Abdominal hysterectomy      There were no vitals filed for this visit.  Visit Diagnosis:  Weakness of right arm - Plan: PT plan of care cert/re-cert  Stiffness of upper arm joint, right - Plan: PT plan of care cert/re-cert  Pain in joint of right shoulder - Plan: PT plan of care cert/re-cert  Activity intolerance - Plan: PT plan of care cert/re-cert      Subjective Assessment - 11/14/14 1240    Subjective Rt shoulder scope with DCR, debridement of labrum. She reports torn RTC in past. She had been having injections for painand was advised to have the surgery/    Limitations Lifting  self care /hair care, dressing modifications, no cooking or heavy cleaningwith RT arm , using  LT   Patient Stated Goals She would like to have shoulder better and limit stiffness.     Currently in Pain? No/denies  She has been almost pain free since surgery   Pain Location Shoulder   Pain Orientation Right   Multiple Pain Sites No            [facility] PT Assessment - 11/14/14 1220    Assessment   Medical Diagnosis RT shoulder labral debridement and  DCR   Referring Provider Rodell Perna , MD   Onset Date/Surgical Date 10/30/14   Hand Dominance Right   Next MD Visit 11/28/14   Prior Therapy No   Precautions   Precautions --   Type of Shoulder Precautions No lifting arm over head , use of sling   Required Braces or Orthoses Sling   Restrictions   Weight Bearing Restrictions No   Balance Screen   Has the patient fallen in the past 6 months No   Has the patient had a decrease in activity level because of a fear of falling?  No   Is the patient reluctant to leave their home because of a fear of falling?  No   Home Ecologist residence  Living Arrangements Spouse/significant other   Type of [place] Two level   Additional Comments stairs to access basement with railing   Prior Function   Level of Independence Needs assistance with homemaking;Independent with basic ADLs   Cognition   Overall Cognitive Status Within Functional Limits for tasks assessed   Observation/Other Assessments   Focus on Therapeutic Outcomes (FOTO)  57%   ROM / Strength   AROM / PROM / Strength AROM;PROM;Strength   AROM   AROM Assessment Site Shoulder;Elbow   Right/Left Shoulder Right;Left   Right Shoulder Extension 10 Degrees   Left Shoulder Extension 60 Degrees   Left Shoulder Flexion 138 Degrees   Left Shoulder ABduction 163 Degrees   Left Shoulder Internal Rotation 57 Degrees   Left Shoulder External Rotation 90 Degrees   Left Shoulder Horizontal ABduction 10 Degrees   Left Shoulder Horizontal ADduction 110 Degrees   Right/Left Elbow Right    Right Elbow Flexion 142   Right Elbow Extension -30  LT -15   PROM   PROM Assessment Site Shoulder   Right/Left Shoulder Right   Right Shoulder Flexion 90 Degrees   Right Shoulder ABduction 90 Degrees   Right Shoulder Internal Rotation 80 Degrees  across abdomen   Right Shoulder External Rotation 12 Degrees  away from abdomen   Strength   Overall Strength Comments LT shoulder WNL except ER 4+/5, RT arm not assessed due to order not here                   [facility] Adult PT Treatment/Exercise - 11/14/14 0001    Shoulder Exercises: Standing   Other Standing Exercises Issued sling exercies x 10-15 reps 3x/day and let pain be guide.    Modalities   Modalities Cryotherapy;Vasopneumatic                PT Education - 11/14/14 1316    Education provided Yes   Education Details POC, sling exercises   Person(s) Educated Patient   Methods Explanation;Tactile cues;Demonstration;Verbal cues;Handout   Comprehension Returned demonstration;Verbalized understanding          PT Short Term Goals - 11/14/14 1319    PT SHORT TERM GOAL #1   Title She will be independent with intial HEP   Time 4   Period Weeks   Status New   PT SHORT TERM GOAL #2   Title She will have minimal pain with activity in protocol limits   Time 4   Period Weeks   Status New   PT SHORT TERM GOAL #3   Title She will be able to pewrform active range with min pain per protocol   Time 4   Period Weeks   Status New   PT SHORT TERM GOAL #4   Title She will progress to week 4 in protocol sfely   Time 4   Period Weeks   Status New           PT Long Term Goals - 11/14/14 1321    PT LONG TERM GOAL #1   Title She will be able to perform all HEP issued as of last visit   Time 8   Period Weeks   Status New   PT LONG TERM GOAL #2   Title she will be able to move RT shoulder to equal  Lt  AROM with min to no pain   Time 8   Period Weeks   Status New   PT LONG TERM  GOAL #3   Title She will  return to moderate home tasks with RT shoulder   Time 8   Period Weeks   Status New   PT LONG TERM GOAL #4   Title she will return to normal work tasks   Time 8   Period Weeks   Status New   PT LONG TERM GOAL #5   Title She will return to normal self care with RT arm   Time 8   Period Weeks   Status New               Plan - 12-12-2014 1317    Clinical Impression Statement Ms Vesely presents post surgery with decreased motion active and passive. She did not come with MD referral so I limited her motion and did not test strength of RT shoulder. She should do well with PT and return to full use   Pt will benefit from skilled therapeutic intervention in order to improve on the following deficits Impaired UE functional use;Pain;Decreased strength;Decreased activity tolerance;Decreased range of motion   Rehab Potential Good   PT Frequency 2x / week   PT Duration 8 weeks   PT Treatment/Interventions ADLs/Self Care Home Management;Cryotherapy;Vasopneumatic Device;Therapeutic exercise;Manual techniques;Taping;Patient/family education;Passive range of motion   PT Next Visit Plan Review MD order and work within that and protocol    PT Home Exercise Plan sling exercises   Consulted and Agree with Plan of Care Patient          G-Codes - 12/12/2014 1233    Functional Assessment Tool Used FOTO 57%   Functional Limitation Other PT primary   Other PT Primary Current Status (W0981) At least 40 percent but less than 60 percent impaired, limited or restricted   Other PT Primary Goal Status (X9147) At least 20 percent but less than 40 percent impaired, limited or restricted       Problem List Patient Active Problem List   Diagnosis Date Noted  . Chronic right shoulder pain 10/14/2014  . Preoperative clearance 10/11/2014  . TIA (transient ischemic attack) 06/08/2014  . Loss of weight 04/13/2014  . Palpitations 03/28/2013  . 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 Scalp psoriasis   . Urinary  urgency 08/04/2012  . Left upper arm pain 12/30/2011  . Healthcare maintenance 07/31/2011  . Knee sprain and strain 07/31/2011  . Sleep disturbance 04/14/2011  . TENSION 04/18/2009  . LEG PAIN, BILATERAL 10/23/2008  . PROBLEMS WITH SWALLOWING AND MASTICATION 10/23/2008  . COLONIC POLYPS 10/27/2007  . DIVERTICULOSIS OF COLON 10/27/2007  . HYPERLIPIDEMIA 06/30/2006  . ALLERGY, ENVIRONMENTAL 06/30/2006  . Prediabetes 06/30/2006    Darrel Hoover PT 12/12/14, 1:25 PM  [facility] [address] [place], [place], [address] Phone: [phone]   Fax:  [phone]  Name: BAHAR SHELDEN MRN: [id number] Date of Birth: 1945/06/10

## 2014-11-14 NOTE — Patient Instructions (Signed)
Sling exercises from cabinet 2-3x/day 10-15 reps

## 2014-11-21 ENCOUNTER — Ambulatory Visit: Payer: 59 | Admitting: Physical Therapy

## 2014-11-21 DIAGNOSIS — R29898 Other symptoms and signs involving the musculoskeletal system: Secondary | ICD-10-CM

## 2014-11-21 DIAGNOSIS — M25621 Stiffness of right elbow, not elsewhere classified: Secondary | ICD-10-CM

## 2014-11-21 DIAGNOSIS — R6889 Other general symptoms and signs: Secondary | ICD-10-CM

## 2014-11-21 NOTE — Therapy (Signed)
[place], [place], [address] Phone: [phone]   Fax:  [phone]  Physical Therapy Treatment  Patient Details  Name: Marilyn Cook MRN: [id number] Date of Birth: 03-May-1945 Referring Provider: Rodell Perna , MD  Encounter Date: 11/21/2014      PT End of Session - 11/21/14 1002    Visit Number 2   Number of Visits 16   Date for PT Re-Evaluation 01/09/15   PT Start Time 0850   PT Stop Time 0930   PT Time Calculation (min) 40 min   Activity Tolerance Patient tolerated treatment well;No increased pain   Behavior During Therapy [facility] for tasks assessed/performed      Past Medical History  Diagnosis Date  . Hyperlipemia   . NSVD (normal spontaneous vaginal delivery)     x 1  . Benign tumor of breast 1975  . Scalp psoriasis   . Osteopenia 08/2012    spine WNL, hip T -1.2  . TIA (transient ischemic attack) 06/08/2014  . Complication of anesthesia     hard to wake up  . Stroke [facility])     possible TIA in 06-2014    Past Surgical History  Procedure Laterality Date  . Partial hysterectomy  1987    due to fibroids, ovaries remain  . R lat epicondyle injections  1998  . Spiral ct  04/02    (-)  . Dexa  08/2012    spine WNL, hip T -1.2  . Abdominal hysterectomy      There were no vitals filed for this visit.  Visit Diagnosis:  Weakness of right arm  Stiffness of upper arm joint, right  Activity intolerance                       [facility] Adult PT Treatment/Exercise - 11/21/14 0848    Self-Care   Self-Care --  sling adjusted, posture precautions, anatomy chart model   Shoulder Exercises: Standing   Other Standing Exercises Sling exercises practiced multiple times, brought husband back for exercise review  cues, answered multiple questions   Shoulder Exercises: ROM/Strengthening   Other ROM/Strengthening Exercises Passive flexion, and abduction about 75 degrees Abduction, 110  flexion.  RT                  PT Short Term Goals - 11/21/14 1014    PT SHORT TERM GOAL #1   Title She will be independent with intial HEP   Baseline multiple cues   Time 4   Period Weeks   Status On-going   PT SHORT TERM GOAL #2   Time 4   Period Weeks   Status On-going   PT SHORT TERM GOAL #3   Title She will be able to pewrform active range with min pain per protocol   Time 4   Period Weeks   Status On-going   PT SHORT TERM GOAL #4   Title She will progress to week 4 in protocol sfely   Time 4   Period Weeks   Status On-going           PT Long Term Goals - 11/14/14 1321    PT LONG TERM GOAL #1   Title She will be able to perform all HEP issued as of last visit   Time 8   Period Weeks   Status New   PT LONG TERM GOAL #2   Title she will be able to move RT  shoulder to equal  Lt  AROM with min to no pain   Time 8   Period Weeks   Status New   PT LONG TERM GOAL #3   Title She will return to moderate home tasks with RT shoulder   Time 8   Period Weeks   Status New   PT LONG TERM GOAL #4   Title she will return to normal work tasks   Time 8   Period Weeks   Status New   PT LONG TERM GOAL #5   Title She will return to normal self care with RT arm   Time 8   Period Weeks   Status New               Plan - 11/21/14 1012    Clinical Impression Statement Multiple cues and calm reassurance, education given,  PROM and sling exercise and education focus.  No new goals met but progress toward Home exercise goal.   PT Next Visit Plan Review MD order and work within that and protocol    PT Home Exercise Plan sling exercises   Consulted and Agree with Plan of Care Patient;Family member/caregiver   Family Member Consulted husband        Problem List Patient Active Problem List   Diagnosis Date Noted  . Chronic right shoulder pain 10/14/2014  . Preoperative clearance 10/11/2014  . TIA (transient ischemic attack) 06/08/2014  . Loss of weight  04/13/2014  . Palpitations 03/28/2013  . 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 Scalp psoriasis   . Urinary urgency 08/04/2012  . Left upper arm pain 12/30/2011  . Healthcare maintenance 07/31/2011  . Knee sprain and strain 07/31/2011  . Sleep disturbance 04/14/2011  . TENSION 04/18/2009  . LEG PAIN, BILATERAL 10/23/2008  . PROBLEMS WITH SWALLOWING AND MASTICATION 10/23/2008  . COLONIC POLYPS 10/27/2007  . DIVERTICULOSIS OF COLON 10/27/2007  . HYPERLIPIDEMIA 06/30/2006  . ALLERGY, ENVIRONMENTAL 06/30/2006  . Prediabetes 06/30/2006    HARRIS,KAREN 11/21/2014, 10:16 AM  [facility] [address] [place], [place], [address] Phone: [phone]   Fax:  [phone]  Name: Marilyn Cook MRN: [id number] Date of Birth: 12/08/1945    Melvenia Needles, PTA 11/21/2014 10:16 AM Phone: [phone] Fax: [phone]

## 2014-11-24 ENCOUNTER — Ambulatory Visit: Payer: 59

## 2014-11-24 DIAGNOSIS — R29898 Other symptoms and signs involving the musculoskeletal system: Secondary | ICD-10-CM

## 2014-11-24 DIAGNOSIS — M25511 Pain in right shoulder: Secondary | ICD-10-CM

## 2014-11-24 DIAGNOSIS — R6889 Other general symptoms and signs: Secondary | ICD-10-CM

## 2014-11-24 DIAGNOSIS — M25621 Stiffness of right elbow, not elsewhere classified: Secondary | ICD-10-CM

## 2014-11-24 NOTE — Therapy (Signed)
[place], [place], [address] Phone: [phone]   Fax:  [phone]  Physical Therapy Treatment  Patient Details  Name: DEMETRA ARMENDARIZ MRN: [id number] Date of Birth: [birth year] Referring Provider: Rodell Perna , MD  Encounter Date: 11/24/2014      PT End of Session - 11/24/14 0842    Visit Number 3   Number of Visits 16   Date for PT Re-Evaluation 01/09/15   PT Start Time 0800   PT Stop Time 0840   PT Time Calculation (min) 40 min   Activity Tolerance Patient tolerated treatment well   Behavior During Therapy [facility] for tasks assessed/performed      Past Medical History  Diagnosis Date  . Hyperlipemia   . NSVD (normal spontaneous vaginal delivery)     x 1  . Benign tumor of breast 1975  . Scalp psoriasis   . Osteopenia 08/2012    spine WNL, hip T -1.2  . TIA (transient ischemic attack) 06/08/2014  . Complication of anesthesia     hard to wake up  . Stroke [facility])     possible TIA in 06-2014    Past Surgical History  Procedure Laterality Date  . Partial hysterectomy  1987    due to fibroids, ovaries remain  . R lat epicondyle injections  1998  . Spiral ct  04/02    (-)  . Dexa  08/2012    spine WNL, hip T -1.2  . Abdominal hysterectomy      There were no vitals filed for this visit.  Visit Diagnosis:  Weakness of right arm  Stiffness of upper arm joint, right  Activity intolerance  Pain in joint of right shoulder      Subjective Assessment - 11/24/14 0808    Subjective Tender lateral upper arm. Lifting of arm first last visit since eval   Currently in Pain? Yes   Pain Score --  mild tenderness   Pain Location Shoulder   Pain Orientation Right   Pain Descriptors / Indicators Tender   Pain Type Surgical pain   Pain Onset 1 to 4 weeks ago   Pain Frequency Intermittent   Aggravating Factors  moving    Multiple Pain Sites No            [facility] PT Assessment - 11/24/14 0001     PROM   Right Shoulder Flexion 120 Degrees   Right Shoulder ABduction 90 Degrees   Right Shoulder Internal Rotation 80 Degrees   Right Shoulder External Rotation 30 Degrees  shoulder no greater than 30 degrees abduct                     [facility] Adult PT Treatment/Exercise - 11/24/14 0001    Shoulder Exercises: Supine   Other Supine Exercises scapula retraction with cues to not elevate scapula. 2 sets of 10   Manual Therapy   Manual Therapy Passive ROM   Passive ROM right shoulder flexion to 120 degrees with 30 sec holds   abduction to 70 degrees with 30 sec holds   ER  to 30 degrees abuduction less than 30 degrees  30 sec holds  IR  x3 30 sec holds with verbal cues to not asssist. with movement. Needed much cuing.       Reviewed HEP and gentleness of pendulum exer              PT Short Term Goals - 11/21/14 1014  PT SHORT TERM GOAL #1   Title She will be independent with intial HEP   Baseline multiple cues   Time 4   Period Weeks   Status On-going   PT SHORT TERM GOAL #2   Time 4   Period Weeks   Status On-going   PT SHORT TERM GOAL #3   Title She will be able to pewrform active range with min pain per protocol   Time 4   Period Weeks   Status On-going   PT SHORT TERM GOAL #4   Title She will progress to week 4 in protocol sfely   Time 4   Period Weeks   Status On-going           PT Long Term Goals - 11/14/14 1321    PT LONG TERM GOAL #1   Title She will be able to perform all HEP issued as of last visit   Time 8   Period Weeks   Status New   PT LONG TERM GOAL #2   Title she will be able to move RT shoulder to equal  Lt  AROM with min to no pain   Time 8   Period Weeks   Status New   PT LONG TERM GOAL #3   Title She will return to moderate home tasks with RT shoulder   Time 8   Period Weeks   Status New   PT LONG TERM GOAL #4   Title she will return to normal work tasks   Time 8   Period Weeks   Status New   PT LONG TERM GOAL #5    Title She will return to normal self care with RT arm   Time 8   Period Weeks   Status New               Plan - 11/24/14 0843    Clinical Impression Statement She has some soreness with PROm but range is within protocol and pain is mild so Io suggested ice and tylenol, not to change HEP for now until she sees Dr Lorin Mercy and we will continue within protocol PROM only until cleared for more. I   PT Next Visit Plan PROM withhin protocol limits, next week 4 weeks protocol. Follow Dr Lorin Mercy limits.    Consulted and Agree with Plan of Care Patient        Problem List Patient Active Problem List   Diagnosis Date Noted  . Chronic right shoulder pain 10/14/2014  . Preoperative clearance 10/11/2014  . TIA (transient ischemic attack) 06/08/2014  . Loss of weight 04/13/2014  . Palpitations 03/28/2013  . 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 Scalp psoriasis   . Urinary urgency 08/04/2012  . Left upper arm pain 12/30/2011  . Healthcare maintenance 07/31/2011  . Knee sprain and strain 07/31/2011  . Sleep disturbance 04/14/2011  . TENSION 04/18/2009  . LEG PAIN, BILATERAL 10/23/2008  . PROBLEMS WITH SWALLOWING AND MASTICATION 10/23/2008  . COLONIC POLYPS 10/27/2007  . DIVERTICULOSIS OF COLON 10/27/2007  . HYPERLIPIDEMIA 06/30/2006  . ALLERGY, ENVIRONMENTAL 06/30/2006  . Prediabetes 06/30/2006    Darrel Hoover PT 11/24/2014, 8:47 AM  [facility] [address] [place], [place], [address] Phone: [phone]   Fax:  [phone]  Name: MIJA ANGLADE MRN: [id number] Date of Birth: 04-30-1945

## 2014-11-28 ENCOUNTER — Ambulatory Visit: Payer: 59 | Admitting: Physical Therapy

## 2014-11-28 DIAGNOSIS — M25511 Pain in right shoulder: Secondary | ICD-10-CM

## 2014-11-28 DIAGNOSIS — M25621 Stiffness of right elbow, not elsewhere classified: Secondary | ICD-10-CM

## 2014-11-28 DIAGNOSIS — R29898 Other symptoms and signs involving the musculoskeletal system: Secondary | ICD-10-CM | POA: Diagnosis not present

## 2014-11-28 NOTE — Therapy (Signed)
[place], [place], [address] Phone: [phone]   Fax:  [phone]  Physical Therapy Treatment  Patient Details  Name: Marilyn Cook MRN: [id number] Date of Birth: 07/27/1945 Referring Provider: Rodell Perna , MD  Encounter Date: 11/28/2014      PT End of Session - 11/28/14 1308    Visit Number 4   Number of Visits 16   Date for PT Re-Evaluation 01/09/15   PT Start Time [id number]   PT Stop Time 1237   PT Time Calculation (min) 46 min   Activity Tolerance Patient tolerated treatment well   Behavior During Therapy [facility] for tasks assessed/performed      Past Medical History  Diagnosis Date  . Hyperlipemia   . NSVD (normal spontaneous vaginal delivery)     x 1  . Benign tumor of breast 1975  . Scalp psoriasis   . Osteopenia 08/2012    spine WNL, hip T -1.2  . TIA (transient ischemic attack) 06/08/2014  . Complication of anesthesia     hard to wake up  . Stroke [facility])     possible TIA in 06-2014    Past Surgical History  Procedure Laterality Date  . Partial hysterectomy  1987    due to fibroids, ovaries remain  . R lat epicondyle injections  1998  . Spiral ct  04/02    (-)  . Dexa  08/2012    spine WNL, hip T -1.2  . Abdominal hysterectomy      There were no vitals filed for this visit.  Visit Diagnosis:  Weakness of right arm  Stiffness of upper arm joint, right  Pain in joint of right shoulder      Subjective Assessment - 11/28/14 1257    Subjective Sore after last session but doing better now. Just came from appointment with Dr. Lorin Mercy, doing well, no changes to plan.    Currently in Pain? Yes   Pain Score 1    Pain Location Shoulder   Pain Orientation Right;Anterior;Lateral;Proximal   Pain Descriptors / Indicators Sore                         [facility] Adult PT Treatment/Exercise - 11/28/14 0001    Exercises   Exercises Shoulder;Elbow;Wrist;Hand   Elbow Exercises   Other elbow exercises PROM - Rt elbow extension/flexion   Other elbow exercises PROM - supination/pronation   Shoulder Exercises: Supine   External Rotation PROM;Right;20 reps   Internal Rotation PROM;Right;20 reps   Flexion PROM;Right;20 reps   ABduction PROM;Right;20 reps   Shoulder Exercises: Seated   Elevation AROM;Both;10 reps                PT Education - 11/28/14 1307    Education provided Yes   Education Details Heavy cues throughout for relaxation during PROM. Discussed positioning, sling fit, HEP, use of ice.    Person(s) Educated Patient   Methods Explanation   Comprehension Verbalized understanding          PT Short Term Goals - 11/21/14 1014    PT SHORT TERM GOAL #1   Title She will be independent with intial HEP   Baseline multiple cues   Time 4   Period Weeks   Status On-going   PT SHORT TERM GOAL #2   Time 4   Period Weeks   Status On-going   PT SHORT TERM GOAL #3   Title She will be  able to pewrform active range with min pain per protocol   Time 4   Period Weeks   Status On-going   PT SHORT TERM GOAL #4   Title She will progress to week 4 in protocol sfely   Time 4   Period Weeks   Status On-going           PT Long Term Goals - 11/14/14 1321    PT LONG TERM GOAL #1   Title She will be able to perform all HEP issued as of last visit   Time 8   Period Weeks   Status New   PT LONG TERM GOAL #2   Title she will be able to move RT shoulder to equal  Lt  AROM with min to no pain   Time 8   Period Weeks   Status New   PT LONG TERM GOAL #3   Title She will return to moderate home tasks with RT shoulder   Time 8   Period Weeks   Status New   PT LONG TERM GOAL #4   Title she will return to normal work tasks   Time 8   Period Weeks   Status New   PT LONG TERM GOAL #5   Title She will return to normal self care with RT arm   Time 8   Period Weeks   Status New               Plan - 11/28/14 1309    Clinical Impression  Statement Continue with PROM, emphasis on relaxation throughout resulted in improved range with decreased pain. Provided with ice following session. Patient able to get to approximately 115 degrees flexion and 20-30 degrees external rotation. Patient with minimal pain during and following session. Range remains limited, would continue to benefit from ongoing PT sessions.    PT Frequency 2x / week   PT Next Visit Plan PROM withhin protocol limits, next week 4 weeks protocol. Follow Dr Lorin Mercy limits.    PT Home Exercise Plan sling exercises   Consulted and Agree with Plan of Care Patient        Problem List Patient Active Problem List   Diagnosis Date Noted  . Chronic right shoulder pain 10/14/2014  . Preoperative clearance 10/11/2014  . TIA (transient ischemic attack) 06/08/2014  . Loss of weight 04/13/2014  . Palpitations 03/28/2013  . 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 Scalp psoriasis   . Urinary urgency 08/04/2012  . Left upper arm pain 12/30/2011  . Healthcare maintenance 07/31/2011  . Knee sprain and strain 07/31/2011  . Sleep disturbance 04/14/2011  . TENSION 04/18/2009  . LEG PAIN, BILATERAL 10/23/2008  . PROBLEMS WITH SWALLOWING AND MASTICATION 10/23/2008  . COLONIC POLYPS 10/27/2007  . DIVERTICULOSIS OF COLON 10/27/2007  . HYPERLIPIDEMIA 06/30/2006  . ALLERGY, ENVIRONMENTAL 06/30/2006  . Prediabetes 06/30/2006    Linard Millers, PT, CSCS 11/28/2014, 1:17 PM  [facility] [address] [place], [place], [address] Phone: [phone]   Fax:  [phone]  Name: Marilyn Cook MRN: [id number] Date of Birth: [birth year]

## 2014-11-29 ENCOUNTER — Ambulatory Visit: Payer: 59

## 2014-11-29 DIAGNOSIS — R29898 Other symptoms and signs involving the musculoskeletal system: Secondary | ICD-10-CM

## 2014-11-29 DIAGNOSIS — M25621 Stiffness of right elbow, not elsewhere classified: Secondary | ICD-10-CM

## 2014-11-29 DIAGNOSIS — R6889 Other general symptoms and signs: Secondary | ICD-10-CM

## 2014-11-29 DIAGNOSIS — M25511 Pain in right shoulder: Secondary | ICD-10-CM

## 2014-11-29 NOTE — Therapy (Signed)
[place], [place], [address] Phone: [phone]   Fax:  [phone]  Physical Therapy Treatment  Patient Details  Name: Marilyn Cook MRN: [id number] Date of Birth: 06/03/1945 Referring Provider: Rodell Perna , MD  Encounter Date: 11/29/2014      PT End of Session - 11/29/14 1320    Visit Number 5   Number of Visits 16   Date for PT Re-Evaluation 01/09/15   PT Start Time [id number]   PT Stop Time [id number]   PT Time Calculation (min) 50 min   Activity Tolerance Patient tolerated treatment well   Behavior During Therapy [facility] for tasks assessed/performed      Past Medical History  Diagnosis Date  . Hyperlipemia   . NSVD (normal spontaneous vaginal delivery)     x 1  . Benign tumor of breast 1975  . Scalp psoriasis   . Osteopenia 08/2012    spine WNL, hip T -1.2  . TIA (transient ischemic attack) 06/08/2014  . Complication of anesthesia     hard to wake up  . Stroke [facility])     possible TIA in 06-2014    Past Surgical History  Procedure Laterality Date  . Partial hysterectomy  1987    due to fibroids, ovaries remain  . R lat epicondyle injections  1998  . Spiral ct  04/02    (-)  . Dexa  08/2012    spine WNL, hip T -1.2  . Abdominal hysterectomy      There were no vitals filed for this visit.  Visit Diagnosis:  Weakness of right arm  Stiffness of upper arm joint, right  Pain in joint of right shoulder  Activity intolerance      Subjective Assessment - 11/29/14 1024    Subjective Fine now , some tender on side , not taking anything for pain   Currently in Pain? No/denies                         [facility] Adult PT Treatment/Exercise - 11/29/14 0001    Manual Therapy   Passive ROM right shoulder flexion to 120 degrees with 30 sec holds   abduction to 70 degrees with 30 sec holds   ER  to 30 degrees abuduction less than 30 degrees  30 sec holds  IR  x3 30 sec holds with verbal cues  to not asssist. with movement. Needed much cuing.        She was cued to relax with stretching and this was done within protocol .             PT Education - 11/28/14 1307    Education provided Yes   Education Details Heavy cues throughout for relaxation during PROM. Discussed positioning, sling fit, HEP, use of ice.    Person(s) Educated Patient   Methods Explanation   Comprehension Verbalized understanding          PT Short Term Goals - 11/29/14 1325    PT SHORT TERM GOAL #1   Title She will be independent with intial HEP   Status Achieved   PT SHORT TERM GOAL #2   Title She will have minimal pain with activity in protocol limits   Status On-going   PT SHORT TERM GOAL #3   Title She will be able to perform active range with min pain per protocol   Status On-going   PT SHORT TERM GOAL #4  Title She will progress to week 4 in protocol safely   Status Achieved           PT Long Term Goals - 11/14/14 1321    PT LONG TERM GOAL #1   Title She will be able to perform all HEP issued as of last visit   Time 8   Period Weeks   Status New   PT LONG TERM GOAL #2   Title she will be able to move RT shoulder to equal  Lt  AROM with min to no pain   Time 8   Period Weeks   Status New   PT LONG TERM GOAL #3   Title She will return to moderate home tasks with RT shoulder   Time 8   Period Weeks   Status New   PT LONG TERM GOAL #4   Title she will return to normal work tasks   Time 8   Period Weeks   Status New   PT LONG TERM GOAL #5   Title She will return to normal self care with RT arm   Time 8   Period Weeks   Status New               Plan - 11/28/14 1309    Clinical Impression Statement Continue with PROM, emphasis on relaxation throughout resulted in improved range with decreased pain. Provided with ice following session. Patient able to get to approximately 115 degrees flexion and 20-30 degrees external rotation. Patient with minimal pain during  and following session. Range remains limited, would continue to benefit from ongoing PT sessions.    PT Frequency 2x / week   PT Next Visit Plan PROM withhin protocol limits, next week 4 weeks protocol. Follow Dr Lorin Mercy limits.    PT Home Exercise Plan sling exercises   Consulted and Agree with Plan of Care Patient    Move to week 5 protocol but cont PROM only 2 more weeks.     Problem List Patient Active Problem List   Diagnosis Date Noted  . Chronic right shoulder pain 10/14/2014  . Preoperative clearance 10/11/2014  . TIA (transient ischemic attack) 06/08/2014  . Loss of weight 04/13/2014  . Palpitations 03/28/2013  . 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 Scalp psoriasis   . Urinary urgency 08/04/2012  . Left upper arm pain 12/30/2011  . Healthcare maintenance 07/31/2011  . Knee sprain and strain 07/31/2011  . Sleep disturbance 04/14/2011  . TENSION 04/18/2009  . LEG PAIN, BILATERAL 10/23/2008  . PROBLEMS WITH SWALLOWING AND MASTICATION 10/23/2008  . COLONIC POLYPS 10/27/2007  . DIVERTICULOSIS OF COLON 10/27/2007  . HYPERLIPIDEMIA 06/30/2006  . ALLERGY, ENVIRONMENTAL 06/30/2006  . Prediabetes 06/30/2006    Darrel Hoover PT 11/29/2014, 1:31 PM  [facility] [address] [place], [place], [address] Phone: [phone]   Fax:  [phone]  Name: Marilyn Cook MRN: [id number] Date of Birth: September 09, 1945

## 2014-12-04 ENCOUNTER — Ambulatory Visit: Payer: 59

## 2014-12-04 DIAGNOSIS — R29898 Other symptoms and signs involving the musculoskeletal system: Secondary | ICD-10-CM

## 2014-12-04 DIAGNOSIS — M25511 Pain in right shoulder: Secondary | ICD-10-CM

## 2014-12-04 DIAGNOSIS — M25621 Stiffness of right elbow, not elsewhere classified: Secondary | ICD-10-CM

## 2014-12-04 NOTE — Therapy (Signed)
[place], [place], [address] Phone: [phone]   Fax:  [phone]  Physical Therapy Treatment  Patient Details  Name: Marilyn Cook MRN: [id number] Date of Birth: Oct 27, 1945 Referring Provider: Rodell Perna , MD  Encounter Date: 12/04/2014      PT End of Session - 12/04/14 1241    Visit Number 6   Number of Visits 16   Date for PT Re-Evaluation 01/09/15   PT Start Time [id number]   PT Stop Time [id number]   PT Time Calculation (min) 50 min   Activity Tolerance Patient tolerated treatment well   Behavior During Therapy [facility] for tasks assessed/performed      Past Medical History  Diagnosis Date  . Hyperlipemia   . NSVD (normal spontaneous vaginal delivery)     x 1  . Benign tumor of breast 1975  . Scalp psoriasis   . Osteopenia 08/2012    spine WNL, hip T -1.2  . TIA (transient ischemic attack) 06/08/2014  . Complication of anesthesia     hard to wake up  . Stroke [facility])     possible TIA in 06-2014    Past Surgical History  Procedure Laterality Date  . Partial hysterectomy  1987    due to fibroids, ovaries remain  . R lat epicondyle injections  1998  . Spiral ct  04/02    (-)  . Dexa  08/2012    spine WNL, hip T -1.2  . Abdominal hysterectomy      There were no vitals filed for this visit.  Visit Diagnosis:  Weakness of right arm  Stiffness of upper arm joint, right  Pain in joint of right shoulder      Subjective Assessment - 12/04/14 1153    Subjective Some pain at times but otherwise doing well.                            [facility] Adult PT Treatment/Exercise - 12/04/14 0001    Manual Therapy   Passive ROM right shoulder scaption to 130 degrees with 20-30 sec holds   abduction to 90 degrees with 20-30 sec holds   ER  to 30 degrees abuduction less than 90 degrees  30 sec holds  IR   30 sec holds with verbal cues to not asssist. with movement. Needed cuing.         Reviewed  HEP and she was doing the pendulum incorrectly but not with danger for injury.             PT Short Term Goals - 11/29/14 1325    PT SHORT TERM GOAL #1   Title She will be independent with intial HEP   Status Achieved   PT SHORT TERM GOAL #2   Title She will have minimal pain with activity in protocol limits   Status On-going   PT SHORT TERM GOAL #3   Title She will be able to perform active range with min pain per protocol   Status On-going   PT SHORT TERM GOAL #4   Title She will progress to week 4 in protocol safely   Status Achieved           PT Long Term Goals - 11/14/14 1321    PT LONG TERM GOAL #1   Title She will be able to perform all HEP issued as of last visit   Time 8   Period Weeks  Status New   PT LONG TERM GOAL #2   Title she will be able to move RT shoulder to equal  Lt  AROM with min to no pain   Time 8   Period Weeks   Status New   PT LONG TERM GOAL #3   Title She will return to moderate home tasks with RT shoulder   Time 8   Period Weeks   Status New   PT LONG TERM GOAL #4   Title she will return to normal work tasks   Time 8   Period Weeks   Status New   PT LONG TERM GOAL #5   Title She will return to normal self care with RT arm   Time 8   Period Weeks   Status New               Plan - 12/04/14 1242    Clinical Impression Statement Continues PROM per order and progressing with range within protocol. She is 5 weeks out from surgery so we will continue with PROM until next week.    PT Next Visit Plan Contniue PROM   Consulted and Agree with Plan of Care Patient        Problem List Patient Active Problem List   Diagnosis Date Noted  . Chronic right shoulder pain 10/14/2014  . Preoperative clearance 10/11/2014  . TIA (transient ischemic attack) 06/08/2014  . Loss of weight 04/13/2014  . Palpitations 03/28/2013  . 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 Scalp psoriasis   . Urinary urgency 08/04/2012  . Left upper arm pain  12/30/2011  . Healthcare maintenance 07/31/2011  . Knee sprain and strain 07/31/2011  . Sleep disturbance 04/14/2011  . TENSION 04/18/2009  . LEG PAIN, BILATERAL 10/23/2008  . PROBLEMS WITH SWALLOWING AND MASTICATION 10/23/2008  . COLONIC POLYPS 10/27/2007  . DIVERTICULOSIS OF COLON 10/27/2007  . HYPERLIPIDEMIA 06/30/2006  . ALLERGY, ENVIRONMENTAL 06/30/2006  . Prediabetes 06/30/2006    Darrel Hoover PT 12/04/2014, 12:54 PM  [facility] [address] [place], [place], [address] Phone: [phone]   Fax:  [phone]  Name: Marilyn Cook MRN: [id number] Date of Birth: Dec 31, 1945

## 2014-12-06 ENCOUNTER — Ambulatory Visit: Payer: 59 | Admitting: Physical Therapy

## 2014-12-06 DIAGNOSIS — M25621 Stiffness of right elbow, not elsewhere classified: Secondary | ICD-10-CM

## 2014-12-06 DIAGNOSIS — R29898 Other symptoms and signs involving the musculoskeletal system: Secondary | ICD-10-CM | POA: Diagnosis not present

## 2014-12-06 DIAGNOSIS — R6889 Other general symptoms and signs: Secondary | ICD-10-CM

## 2014-12-06 DIAGNOSIS — M25511 Pain in right shoulder: Secondary | ICD-10-CM

## 2014-12-06 NOTE — Therapy (Signed)
[place], [place], [address] Phone: [phone]   Fax:  [phone]  Physical Therapy Treatment  Patient Details  Name: Marilyn Cook MRN: [id number] Date of Birth: 26-Jun-1945 Referring Provider: Rodell Perna , MD  Encounter Date: 12/06/2014      PT End of Session - 12/06/14 1154    Visit Number 7   Number of Visits 16   Date for PT Re-Evaluation 01/09/15   Authorization Type [facility]   PT Start Time 1100   PT Stop Time 1149   PT Time Calculation (min) 49 min   Activity Tolerance Patient tolerated treatment well      Past Medical History  Diagnosis Date  . Hyperlipemia   . NSVD (normal spontaneous vaginal delivery)     x 1  . Benign tumor of breast 1975  . Scalp psoriasis   . Osteopenia 08/2012    spine WNL, hip T -1.2  . TIA (transient ischemic attack) 06/08/2014  . Complication of anesthesia     hard to wake up  . Stroke [facility])     possible TIA in 06-2014    Past Surgical History  Procedure Laterality Date  . Partial hysterectomy  1987    due to fibroids, ovaries remain  . R lat epicondyle injections  1998  . Spiral ct  04/02    (-)  . Dexa  08/2012    spine WNL, hip T -1.2  . Abdominal hysterectomy      There were no vitals filed for this visit.  Visit Diagnosis:  Weakness of right arm  Stiffness of upper arm joint, right  Pain in joint of right shoulder  Activity intolerance      Subjective Assessment - 12/06/14 1103    Subjective The only time it hurts is with therapy.  MD 12/13.  Going back to work Monday.     Currently in Pain? No/denies   Pain Score 0-No pain   Pain Location Shoulder   Pain Orientation Right   Pain Type Surgical pain   Pain Onset More than a month ago   Pain Frequency Intermittent                         [facility] Adult PT Treatment/Exercise - 12/06/14 1108    Shoulder Exercises: Supine   External Rotation PROM;Right;20 reps   Internal Rotation PROM;Right;20 reps   Flexion PROM;Right;20 reps   ABduction PROM;Right;20 reps   Other Supine Exercises scapula retraction with cues to not elevate scapula. 2 sets of 10   Shoulder Exercises: Seated   Other Seated Exercises upper trap stretch 3x 30 sec   Other Seated Exercises scapular retraction/depression                PT Education - 12/06/14 1142    Education provided Yes   Education Details scapular retraction/depression, PROM flexion, upper trap stretch   Person(s) Educated Patient   Methods Explanation;Demonstration;Handout   Comprehension Verbalized understanding;Returned demonstration          PT Short Term Goals - 12/06/14 1157    PT SHORT TERM GOAL #1   Title She will be independent with intial HEP   Status Achieved   PT SHORT TERM GOAL #2   Title She will have minimal pain with activity in protocol limits   Status Achieved   PT SHORT TERM GOAL #3   Title She will be able to perform active range  with min pain per protocol   Time 4   Period Weeks   Status On-going   PT SHORT TERM GOAL #4   Title She will progress to week 4 in protocol safely   Status Achieved           PT Long Term Goals - 12/06/14 1158    PT LONG TERM GOAL #1   Title She will be able to perform all HEP issued as of last visit   Time 8   Period Weeks   Status On-going   PT LONG TERM GOAL #2   Title she will be able to move RT shoulder to equal  Lt  AROM with min to no pain   Time 8   Period Weeks   Status On-going   PT LONG TERM GOAL #3   Title She will return to moderate home tasks with RT shoulder   Period Weeks   Status On-going   PT LONG TERM GOAL #4   Title she will return to normal work tasks   Time 8   Period Weeks   Status On-going   PT LONG TERM GOAL #5   Title She will return to normal self care with RT arm   Time 8   Period Weeks   Status On-going               Plan - 12/06/14 1154    Clinical Impression Statement The patient  is 5 weeks post op right rotator cuff repair.  She is returning part time with restrictions to her job in [place] on Monday.  States Dr. Lorin Mercy gave her permission to be out of the sling "a little bit."  PROM continued per protocol.  Initiate AAROM next week at 6 weeks post-op.  Therapist monitoring response throughout treatment session.  No increase in pain during or following.     PT Next Visit Plan Initiate AAROM next week        Problem List Patient Active Problem List   Diagnosis Date Noted  . Chronic right shoulder pain 10/14/2014  . Preoperative clearance 10/11/2014  . TIA (transient ischemic attack) 06/08/2014  . Loss of weight 04/13/2014  . Palpitations 03/28/2013  . 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 Scalp psoriasis   . Urinary urgency 08/04/2012  . Left upper arm pain 12/30/2011  . Healthcare maintenance 07/31/2011  . Knee sprain and strain 07/31/2011  . Sleep disturbance 04/14/2011  . TENSION 04/18/2009  . LEG PAIN, BILATERAL 10/23/2008  . PROBLEMS WITH SWALLOWING AND MASTICATION 10/23/2008  . COLONIC POLYPS 10/27/2007  . DIVERTICULOSIS OF COLON 10/27/2007  . HYPERLIPIDEMIA 06/30/2006  . ALLERGY, ENVIRONMENTAL 06/30/2006  . Prediabetes 06/30/2006    Alvera Singh 12/06/2014, 11:59 AM  [facility] [address] [place], [place], [address] Phone: [phone]   Fax:  [phone]  Name: ELLIOTTE CELAYA MRN: [id number] Date of Birth: 1945-09-30    Ruben Im, PT 12/06/2014 11:59 AM Phone: [phone] Fax: [phone]

## 2014-12-11 ENCOUNTER — Encounter: Payer: 59 | Admitting: Physical Therapy

## 2014-12-12 ENCOUNTER — Ambulatory Visit: Payer: 59 | Attending: Orthopaedic Surgery | Admitting: Physical Therapy

## 2014-12-12 DIAGNOSIS — R29898 Other symptoms and signs involving the musculoskeletal system: Secondary | ICD-10-CM | POA: Diagnosis not present

## 2014-12-12 DIAGNOSIS — R6889 Other general symptoms and signs: Secondary | ICD-10-CM | POA: Diagnosis present

## 2014-12-12 DIAGNOSIS — M25511 Pain in right shoulder: Secondary | ICD-10-CM | POA: Diagnosis present

## 2014-12-12 DIAGNOSIS — M25621 Stiffness of right elbow, not elsewhere classified: Secondary | ICD-10-CM | POA: Diagnosis present

## 2014-12-12 NOTE — Patient Instructions (Signed)
Cane Exercise: Flexion    Lie on back, holding cane above chest. Keeping arms as straight as possible, lower cane toward floor beyond head. Hold 1-5____ seconds. Repeat __10__ times. Do _1___ sessions per day.  http://gt2.exer.us/92   Copyright  VHI. All rights reserved.  Cane Exercise: Abduction / Adduction    Tenir la canne avec les paumes vers le bas. En gardant [employer], bouger la canne d'un ct  l'autre au-dessus de la poitrine. Maintenir _1-5___ Marilyn Cook de chaque ct. Rpter _10___ Marilyn Cook. Faire __1__ sances par jour Chest press and ER cane exercises added (All issued from exercise drawer).  http://gt2.exer.us/90   Copyright  VHI. All rights reserved.

## 2014-12-12 NOTE — Therapy (Signed)
[name], [place], [address] Phone: [phone]   Fax:  [phone]  Physical Therapy Treatment  Patient Details  Name: Marilyn Cook MRN: [id number] Date of Birth: 19-Feb-1945 Referring Provider: Rodell Perna , MD  Encounter Date: 12/12/2014      PT End of Session - 12/12/14 1750    Visit Number 7   Number of Visits 16   Date for PT Re-Evaluation 01/09/15   PT Start Time 1332   PT Stop Time 1420   PT Time Calculation (min) 48 min   Activity Tolerance Patient tolerated treatment well   Behavior During Therapy [facility] for tasks assessed/performed      Past Medical History  Diagnosis Date  . Hyperlipemia   . NSVD (normal spontaneous vaginal delivery)     x 1  . Benign tumor of breast 1975  . Scalp psoriasis   . Osteopenia 08/2012    spine WNL, hip T -1.2  . TIA (transient ischemic attack) 06/08/2014  . Complication of anesthesia     hard to wake up  . Stroke [facility])     possible TIA in 06-2014    Past Surgical History  Procedure Laterality Date  . Partial hysterectomy  1987    due to fibroids, ovaries remain  . R lat epicondyle injections  1998  . Spiral ct  04/02    (-)  . Dexa  08/2012    spine WNL, hip T -1.2  . Abdominal hysterectomy      There were no vitals filed for this visit.  Visit Diagnosis:  Weakness of right arm  Stiffness of upper arm joint, right  Activity intolerance      Subjective Assessment - 12/12/14 1323    Subjective Started work light duty .  She was worried using the mouse was not Ok.  6 weeks post OP.     Currently in Pain? Yes   Pain Score --  mild   Pain Location Shoulder   Pain Orientation Right   Pain Descriptors / Indicators --  tender   Pain Radiating Towards mid deltiod   Pain Frequency Intermittent   Aggravating Factors  moving arm, PT sometimes.                         [facility] Adult PT Treatment/Exercise - 12/12/14 1334    Shoulder Exercises: Supine   Horizontal ABduction Limitations Cane, both , AA    External Rotation AAROM;Both;10 reps  cane   Flexion AAROM;Both;10 reps  cane   Other Supine Exercises chest press 10 X Cane AAROM   Other Supine Exercises triceps  1LB 10X exbow support needed   Shoulder Exercises: Seated   Retraction --  5 X with scapular depression.     Manual Therapy   Passive ROM passive stretch elbow, sfoulder, flexion, abduction ER gentle                 PT Education - 12/12/14 1750    Education provided Yes   Education Details supine cane   Person(s) Educated Patient   Methods Explanation;Demonstration;Tactile cues;Verbal cues;Handout   Comprehension Verbalized understanding;Returned demonstration;Need further instruction          PT Short Term Goals - 12/12/14 1752    PT SHORT TERM GOAL #1   Title She will be independent with intial HEP   Time 4   Period Weeks   Status Achieved   PT SHORT TERM  GOAL #2   Title She will have minimal pain with activity in protocol limits   Time 4   Period Weeks   Status Achieved   PT SHORT TERM GOAL #3   Title She will be able to perform active range with min pain per protocol   Time 4   Period Weeks   Status On-going   PT SHORT TERM GOAL #4   Title She will progress to week 4 in protocol safely   Time 4   Period Weeks   Status Achieved           PT Long Term Goals - 12/12/14 1753    PT LONG TERM GOAL #1   Title She will be able to perform all HEP issued as of last visit   Time 8   Period Weeks   Status On-going   PT LONG TERM GOAL #2   Title she will be able to move RT shoulder to equal  Lt  AROM with min to no pain   Time 8   Period Weeks   Status On-going   PT LONG TERM GOAL #3   Title She will return to moderate home tasks with RT shoulder   Time 8   Period Weeks   Status Unable to assess   PT LONG TERM GOAL #4   Title she will return to normal work tasks   Baseline light duty started yesterday    Time 8   Period Weeks   Status On-going   PT LONG TERM GOAL #5   Title She will return to normal self care with RT arm   Time 8   Period Weeks   Status On-going               Plan - 12/12/14 1750    Clinical Impression Statement 6 weeks post op.  126 AA supine flexion.  Progress troward home exercise goal.   PT Next Visit Plan review home exercise supine cane   PT Home Exercise Plan supine cane        Problem List Patient Active Problem List   Diagnosis Date Noted  . Chronic right shoulder pain 10/14/2014  . Preoperative clearance 10/11/2014  . TIA (transient ischemic attack) 06/08/2014  . Loss of weight 04/13/2014  . Palpitations 03/28/2013  . 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 Scalp psoriasis   . Urinary urgency 08/04/2012  . Left upper arm pain 12/30/2011  . Healthcare maintenance 07/31/2011  . Knee sprain and strain 07/31/2011  . Sleep disturbance 04/14/2011  . TENSION 04/18/2009  . LEG PAIN, BILATERAL 10/23/2008  . PROBLEMS WITH SWALLOWING AND MASTICATION 10/23/2008  . COLONIC POLYPS 10/27/2007  . DIVERTICULOSIS OF COLON 10/27/2007  . HYPERLIPIDEMIA 06/30/2006  . ALLERGY, ENVIRONMENTAL 06/30/2006  . Prediabetes 06/30/2006    HARRIS,KAREN 12/12/2014, 5:54 PM  [facility] [address] [place], [place], [address] Phone: [phone]   Fax:  [phone]  Name: Marilyn Cook MRN: [id number] Date of Birth: Oct 16, 1945    Melvenia Needles, PTA 12/12/2014 5:54 PM Phone: [phone] Fax: [phone]

## 2014-12-13 ENCOUNTER — Encounter: Payer: 59 | Admitting: Physical Therapy

## 2014-12-14 ENCOUNTER — Ambulatory Visit: Payer: 59 | Admitting: Physical Therapy

## 2014-12-14 DIAGNOSIS — R6889 Other general symptoms and signs: Secondary | ICD-10-CM

## 2014-12-14 DIAGNOSIS — M25511 Pain in right shoulder: Secondary | ICD-10-CM

## 2014-12-14 DIAGNOSIS — M25621 Stiffness of right elbow, not elsewhere classified: Secondary | ICD-10-CM

## 2014-12-14 DIAGNOSIS — R29898 Other symptoms and signs involving the musculoskeletal system: Secondary | ICD-10-CM

## 2014-12-14 NOTE — Patient Instructions (Signed)
Strengthening: Isometric Flexion  Using wall for resistance, press right fist into ball using light pressure. Hold __5__ seconds. Repeat _5___ times per set. Do __5__ sets per session. Do ____5 sessions per day.  SHOULDER: Abduction (Isometric)  Use wall as resistance. Press arm against pillow. Keep elbow straight. Hold _5__ seconds. _5__ reps per set, __1_ sets per day, __7_ days per week  Extension (Isometric)  Place left bent elbow and back of arm against wall. Press elbow against wall. Hold ___5_ seconds. Repeat _5___ times. Do __5__ sessions per day.  Internal Rotation (Isometric)  Place palm of right fist against door frame, with elbow bent. Press fist against door frame. Hold __5__ seconds. Repeat __5__ times. Do _5___ sessions per day.  External Rotation (Isometric)  Place back of left fist against door frame, with elbow bent. Press fist against door frame. Hold ___5_ seconds. Repeat ___5_ times. Do __5__ sessions per day.  Copyright  VHI. All rights reserved.

## 2014-12-14 NOTE — Therapy (Signed)
[place], [place], [address] Phone: [phone]   Fax:  [phone]  Physical Therapy Treatment  Patient Details  Name: Marilyn Cook MRN: [id number] Date of Birth: 05-14-1945 Referring Provider: Rodell Perna , MD  Encounter Date: 12/14/2014      PT End of Session - 12/14/14 1443    Visit Number 8   Number of Visits 16   Date for PT Re-Evaluation 01/09/15   Authorization Type [facility]   PT Start Time [id number]   PT Stop Time 1500   PT Time Calculation (min) 45 min   Activity Tolerance Patient tolerated treatment well      Past Medical History  Diagnosis Date  . Hyperlipemia   . NSVD (normal spontaneous vaginal delivery)     x 1  . Benign tumor of breast 1975  . Scalp psoriasis   . Osteopenia 08/2012    spine WNL, hip T -1.2  . TIA (transient ischemic attack) 06/08/2014  . Complication of anesthesia     hard to wake up  . Stroke [facility])     possible TIA in 06-2014    Past Surgical History  Procedure Laterality Date  . Partial hysterectomy  1987    due to fibroids, ovaries remain  . R lat epicondyle injections  1998  . Spiral ct  04/02    (-)  . Dexa  08/2012    spine WNL, hip T -1.2  . Abdominal hysterectomy      There were no vitals filed for this visit.  Visit Diagnosis:  Weakness of right arm  Stiffness of upper arm joint, right  Activity intolerance  Pain in joint of right shoulder      Subjective Assessment - 12/14/14 1423    Subjective Continues to work light duty.  Presents in sling.  Concerned that reaching for something on the floor might be bad last night so put ice on it.     Currently in Pain? Yes   Pain Score 1    Pain Location Shoulder   Pain Orientation Right   Pain Type Surgical pain                         [facility] Adult PT Treatment/Exercise - 12/14/14 0001    Shoulder Exercises: Supine   Protraction AAROM;Right;10 reps   External Rotation  AAROM;Right;15 reps   Theraband Level (Shoulder External Rotation) --  UE Ranger   Flexion AAROM;Right;15 reps   Theraband Level (Shoulder Flexion) --  UE Ranger   Other Supine Exercises short lever stab at 90 degrees 3x 20 sec   Shoulder Exercises: Sidelying   External Rotation AROM;Right;10 reps   Other Sidelying Exercises UE Ranger elevation 15x   Shoulder Exercises: Isometric Strengthening   Flexion 5X5"   Extension 5X5"   External Rotation 5X5"   Internal Rotation 5X5"   ABduction 5X5"                PT Education - 12/14/14 1518    Education provided Yes   Education Details shoulder isometrics   Person(s) Educated Patient   Methods Explanation;Demonstration;Handout   Comprehension Verbalized understanding;Returned demonstration          PT Short Term Goals - 12/14/14 1634    PT SHORT TERM GOAL #1   Title She will be independent with intial HEP   Status Achieved   PT SHORT TERM GOAL #2   Title She will  have minimal pain with activity in protocol limits   Status Achieved   PT SHORT TERM GOAL #3   Title She will be able to perform active range with min pain per protocol   Time 4   Period Weeks   Status On-going   PT SHORT TERM GOAL #4   Title She will progress to week 4 in protocol safely   Status Achieved           PT Long Term Goals - 12/14/14 1443    PT LONG TERM GOAL #1   Title She will be able to perform all HEP issued as of last visit   Time 8   Period Weeks   Status On-going   PT LONG TERM GOAL #2   Title she will be able to move RT shoulder to equal  Lt  AROM with min to no pain   Time 8   Period Weeks   Status On-going   PT LONG TERM GOAL #3   Title She will return to moderate home tasks with RT shoulder   Time 8   Period Weeks   Status On-going   PT LONG TERM GOAL #4   Title she will return to normal work tasks   Time 8   Period Weeks   Status On-going   PT LONG TERM GOAL #5   Title She will return to normal self care with  RT arm   Time 8   Period Weeks   Status On-going               Plan - 12/14/14 1519    Clinical Impression Statement The patient is progressing as expected for 6 1/2 weeks s/p rotator cuff repair.  Therapist closely monitoring pain response and adherence to rotator cuff precautions appropriate for this length of time from surgery.    No pain just muscular fatigue with ther ex.     PT Next Visit Plan AAROM to AROM right shoulder in supine, seated and standing;  review isometrics;  UE Ranger, ball roll   PT Home Exercise Plan shoulder isometrics        Problem List Patient Active Problem List   Diagnosis Date Noted  . Chronic right shoulder pain 10/14/2014  . Preoperative clearance 10/11/2014  . TIA (transient ischemic attack) 06/08/2014  . Loss of weight 04/13/2014  . Palpitations 03/28/2013  . 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 Scalp psoriasis   . Urinary urgency 08/04/2012  . Left upper arm pain 12/30/2011  . Healthcare maintenance 07/31/2011  . Knee sprain and strain 07/31/2011  . Sleep disturbance 04/14/2011  . TENSION 04/18/2009  . LEG PAIN, BILATERAL 10/23/2008  . PROBLEMS WITH SWALLOWING AND MASTICATION 10/23/2008  . COLONIC POLYPS 10/27/2007  . DIVERTICULOSIS OF COLON 10/27/2007  . HYPERLIPIDEMIA 06/30/2006  . ALLERGY, ENVIRONMENTAL 06/30/2006  . Prediabetes 06/30/2006    Marilyn Cook 12/14/2014, 5:03 PM  [facility] [address] [place], [place], [address] Phone: [phone]   Fax:  [phone]  Name: Marilyn Cook MRN: [id number] Date of Birth: 1945-04-29    Ruben Im, PT 12/14/2014 5:03 PM Phone: [phone] Fax: [phone]

## 2014-12-18 ENCOUNTER — Encounter: Payer: 59 | Admitting: Physical Therapy

## 2014-12-19 ENCOUNTER — Ambulatory Visit: Payer: 59 | Admitting: Physical Therapy

## 2014-12-19 DIAGNOSIS — R29898 Other symptoms and signs involving the musculoskeletal system: Secondary | ICD-10-CM | POA: Diagnosis not present

## 2014-12-19 DIAGNOSIS — M25511 Pain in right shoulder: Secondary | ICD-10-CM

## 2014-12-19 DIAGNOSIS — R6889 Other general symptoms and signs: Secondary | ICD-10-CM

## 2014-12-19 DIAGNOSIS — M25621 Stiffness of right elbow, not elsewhere classified: Secondary | ICD-10-CM

## 2014-12-19 NOTE — Therapy (Signed)
[place], [place], [address] Phone: [phone]   Fax:  [phone]  Physical Therapy Treatment  Patient Details  Name: Marilyn Cook MRN: [id number] Date of Birth: [birth year] Referring Provider: Rodell Perna , MD  Encounter Date: 12/19/2014      PT End of Session - 12/19/14 1518    Visit Number 9   Number of Visits 16   Date for PT Re-Evaluation 01/09/15   Authorization Type [facility]   PT Start Time 1331   PT Stop Time 1416   PT Time Calculation (min) 45 min   Activity Tolerance Patient tolerated treatment well;No increased pain   Behavior During Therapy [facility] for tasks assessed/performed      Past Medical History  Diagnosis Date  . Hyperlipemia   . NSVD (normal spontaneous vaginal delivery)     x 1  . Benign tumor of breast 1975  . Scalp psoriasis   . Osteopenia 08/2012    spine WNL, hip T -1.2  . TIA (transient ischemic attack) 06/08/2014  . Complication of anesthesia     hard to wake up  . Stroke [facility])     possible TIA in 06-2014    Past Surgical History  Procedure Laterality Date  . Partial hysterectomy  1987    due to fibroids, ovaries remain  . R lat epicondyle injections  1998  . Spiral ct  04/02    (-)  . Dexa  08/2012    spine WNL, hip T -1.2  . Abdominal hysterectomy      There were no vitals filed for this visit.  Visit Diagnosis:  Weakness of right arm  Stiffness of upper arm joint, right  Activity intolerance  Pain in joint of right shoulder      Subjective Assessment - 12/19/14 1333    Subjective Doing well, gradually improving. Going for followup appointment later today.    Currently in Pain? No/denies   Aggravating Factors  moving arm too much                         [facility] Adult PT Treatment/Exercise - 12/19/14 0001    Shoulder Exercises: Supine   Protraction Strengthening;Right;10 reps;Other (comment)  min resistance and tactile cues   External Rotation Strengthening;10 reps;Other (comment)  min resisted full range   Internal Rotation Strengthening;Right;10 reps;Other (comment)  min resist full range   Flexion Strengthening;Right;10 reps   Shoulder Exercises: Seated   External Rotation Strengthening;Right;10 reps;Other (comment)  isometric min-mod   Internal Rotation Strengthening;Right;10 reps;Other (comment)  isometric min-mod   Flexion Strengthening;Right;10 reps;Other (comment)   min-mod resist isometrics   Other Seated Exercises isometric extension 1X10   Shoulder Exercises: Stretch   External Rotation Stretch 10 seconds;Other (comment)  10 reps   Other Shoulder Stretches flexion 1X10 - endrange stretch 126 degrees                PT Education - 12/19/14 1518    Education provided Yes   Education Details HEP   Person(s) Educated Patient   Methods Explanation;Demonstration;Verbal cues;Tactile cues;Handout   Comprehension Returned demonstration;Verbalized understanding          PT Short Term Goals - 12/14/14 1634    PT SHORT TERM GOAL #1   Title She will be independent with intial HEP   Status Achieved   PT SHORT TERM GOAL #2   Title She will have minimal pain with activity in protocol  limits   Status Achieved   PT SHORT TERM GOAL #3   Title She will be able to perform active range with min pain per protocol   Time 4   Period Weeks   Status On-going   PT SHORT TERM GOAL #4   Title She will progress to week 4 in protocol safely   Status Achieved           PT Long Term Goals - 12/14/14 1443    PT LONG TERM GOAL #1   Title She will be able to perform all HEP issued as of last visit   Time 8   Period Weeks   Status On-going   PT LONG TERM GOAL #2   Title she will be able to move RT shoulder to equal  Lt  AROM with min to no pain   Time 8   Period Weeks   Status On-going   PT LONG TERM GOAL #3   Title She will return to moderate home tasks with RT shoulder   Time 8   Period  Weeks   Status On-going   PT LONG TERM GOAL #4   Title she will return to normal work tasks   Time 8   Period Weeks   Status On-going   PT LONG TERM GOAL #5   Title She will return to normal self care with RT arm   Time 8   Period Weeks   Status On-going               Plan - 12/19/14 1519    Clinical Impression Statement Patient is making gradual progress, reports that she is going to her follow-up visit later today. Reviewed HEP with isometrics and started gentle resisted concentric exercises with manual resistance today. Patient denies any complaints during or after session. Shoulder flexion 126 degrees in supine. Will continue with PT session and progress as tlolerated and within protocol.    PT Next Visit Plan Discuss follow-up visit with MD, check HEP isometrics and progress as per protocol. Progress shoulder flexion as able.   PT Home Exercise Plan review isometrics for technique, check home shoulder flexion stretch.    Consulted and Agree with Plan of Care Patient        Problem List Patient Active Problem List   Diagnosis Date Noted  . Chronic right shoulder pain 10/14/2014  . Preoperative clearance 10/11/2014  . TIA (transient ischemic attack) 06/08/2014  . Loss of weight 04/13/2014  . Palpitations 03/28/2013  . 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 Scalp psoriasis   . Urinary urgency 08/04/2012  . Left upper arm pain 12/30/2011  . Healthcare maintenance 07/31/2011  . Knee sprain and strain 07/31/2011  . Sleep disturbance 04/14/2011  . TENSION 04/18/2009  . LEG PAIN, BILATERAL 10/23/2008  . PROBLEMS WITH SWALLOWING AND MASTICATION 10/23/2008  . COLONIC POLYPS 10/27/2007  . DIVERTICULOSIS OF COLON 10/27/2007  . HYPERLIPIDEMIA 06/30/2006  . ALLERGY, ENVIRONMENTAL 06/30/2006  . Prediabetes 06/30/2006    Linard Millers, PT 12/19/2014, 3:24 PM  [facility] [address] [place], [place], [address] Phone:  [phone]   Fax:  [phone]  Name: Marilyn Cook MRN: [id number] Date of Birth: [birth year]

## 2014-12-20 ENCOUNTER — Encounter: Payer: 59 | Admitting: Physical Therapy

## 2014-12-21 ENCOUNTER — Ambulatory Visit: Payer: 59 | Admitting: Physical Therapy

## 2014-12-21 DIAGNOSIS — M25511 Pain in right shoulder: Secondary | ICD-10-CM

## 2014-12-21 DIAGNOSIS — R29898 Other symptoms and signs involving the musculoskeletal system: Secondary | ICD-10-CM

## 2014-12-21 DIAGNOSIS — M25621 Stiffness of right elbow, not elsewhere classified: Secondary | ICD-10-CM

## 2014-12-21 DIAGNOSIS — R6889 Other general symptoms and signs: Secondary | ICD-10-CM

## 2014-12-21 NOTE — Therapy (Signed)
[place], [place], [address] Phone: [phone]   Fax:  [phone]  Physical Therapy Treatment  Patient Details  Name: Marilyn Cook MRN: [id number] Date of Birth: 09-15-1945 Referring Provider: Rodell Perna , MD  Encounter Date: 12/21/2014      PT End of Session - 12/21/14 1420    Visit Number 10   Number of Visits 16   Date for PT Re-Evaluation 01/09/15   Authorization Type [facility]   PT Start Time 1410   PT Stop Time 1500   PT Time Calculation (min) 50 min   Activity Tolerance Patient tolerated treatment well      Past Medical History  Diagnosis Date  . Hyperlipemia   . NSVD (normal spontaneous vaginal delivery)     x 1  . Benign tumor of breast 1975  . Scalp psoriasis   . Osteopenia 08/2012    spine WNL, hip T -1.2  . TIA (transient ischemic attack) 06/08/2014  . Complication of anesthesia     hard to wake up  . Stroke [facility])     possible TIA in 06-2014    Past Surgical History  Procedure Laterality Date  . Partial hysterectomy  1987    due to fibroids, ovaries remain  . R lat epicondyle injections  1998  . Spiral ct  04/02    (-)  . Dexa  08/2012    spine WNL, hip T -1.2  . Abdominal hysterectomy      There were no vitals filed for this visit.  Visit Diagnosis:  Weakness of right arm  Stiffness of upper arm joint, right  Activity intolerance  Pain in joint of right shoulder      Subjective Assessment - 12/21/14 1413    Subjective Presents in sling.  Did some cooking/baking last night.   Currently in Pain? No/denies   Pain Score 1    Pain Location Shoulder   Pain Orientation Right   Pain Descriptors / Indicators Tender   Pain Type Surgical pain   Pain Onset More than a month ago   Pain Frequency Intermittent                         [facility] Adult PT Treatment/Exercise - 12/21/14 1417    Shoulder Exercises: Supine   Protraction Strengthening;Right;10  reps;Other (comment)  min resistance and tactile cues   Flexion Strengthening;Right;10 reps   Other Supine Exercises short lever stab at 90 degrees 3x 20 sec rhythmic stab   Shoulder Exercises: Seated   Other Seated Exercises UE Ranger FW and back 20x   Shoulder Exercises: Prone   Extension AROM;Right;12 reps   Shoulder Exercises: Standing   Other Standing Exercises UE Ranger on small and medium steps 15x each   Other Standing Exercises Wall push ups 10x   Shoulder Exercises: Pulleys   Flexion 3 minutes   Shoulder Exercises: Isometric Strengthening   Flexion 5X5"   Extension 5X5"   External Rotation 5X5"   Internal Rotation 5X5"   ABduction 5X5"                  PT Short Term Goals - 12/21/14 1455    PT SHORT TERM GOAL #1   Title She will be independent with intial HEP   Status Achieved   PT SHORT TERM GOAL #2   Title She will have minimal pain with activity in protocol limits   Status Achieved  PT SHORT TERM GOAL #3   Title She will be able to perform active range with min pain per protocol   Status Achieved   PT SHORT TERM GOAL #4   Title She will progress to week 4 in protocol safely   Status Achieved           PT Long Term Goals - 12/21/14 1455    PT LONG TERM GOAL #1   Title She will be able to perform all HEP issued as of last visit   Time 8   Period Weeks   Status On-going   PT LONG TERM GOAL #2   Title she will be able to move RT shoulder to equal  Lt  AROM with min to no pain   Time 8   Period Weeks   Status On-going   PT LONG TERM GOAL #3   Title She will return to moderate home tasks with RT shoulder   Time 8   Period Weeks   Status On-going   PT LONG TERM GOAL #4   Title she will return to normal work tasks   Time 8   Period Weeks   Status On-going   PT LONG TERM GOAL #5   Title She will return to normal self care with RT arm   Time 8   Period Weeks   Status On-going               Plan - 12/21/14 1447    Clinical  Impression Statement Patient progressing as anticipated s/p 7 1/2 weeks rotator cuff repair.  Therapist closely monitoring for adherence to RT cuff precautions and pain response.  MInimal discomfort reported.     PT Next Visit Plan Add yellow band exercises low level;  continue with low level AAROM and AROM        Problem List Patient Active Problem List   Diagnosis Date Noted  . Chronic right shoulder pain 10/14/2014  . Preoperative clearance 10/11/2014  . TIA (transient ischemic attack) 06/08/2014  . Loss of weight 04/13/2014  . Palpitations 03/28/2013  . 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 Scalp psoriasis   . Urinary urgency 08/04/2012  . Left upper arm pain 12/30/2011  . Healthcare maintenance 07/31/2011  . Knee sprain and strain 07/31/2011  . Sleep disturbance 04/14/2011  . TENSION 04/18/2009  . LEG PAIN, BILATERAL 10/23/2008  . PROBLEMS WITH SWALLOWING AND MASTICATION 10/23/2008  . COLONIC POLYPS 10/27/2007  . DIVERTICULOSIS OF COLON 10/27/2007  . HYPERLIPIDEMIA 06/30/2006  . ALLERGY, ENVIRONMENTAL 06/30/2006  . Prediabetes 06/30/2006    Alvera Singh 12/21/2014, 2:58 PM  [facility] [address] [place], [place], [address] Phone: [phone]   Fax:  [phone]  Name: Marilyn Cook MRN: [id number] Date of Birth: 1945-04-02    Ruben Im, PT 12/21/2014 3:00 PM Phone: [phone] Fax: [phone]

## 2014-12-25 ENCOUNTER — Other Ambulatory Visit: Payer: Self-pay | Admitting: Family Medicine

## 2014-12-26 ENCOUNTER — Ambulatory Visit: Payer: 59 | Admitting: Physical Therapy

## 2014-12-26 DIAGNOSIS — R6889 Other general symptoms and signs: Secondary | ICD-10-CM

## 2014-12-26 DIAGNOSIS — R29898 Other symptoms and signs involving the musculoskeletal system: Secondary | ICD-10-CM | POA: Diagnosis not present

## 2014-12-26 DIAGNOSIS — M25511 Pain in right shoulder: Secondary | ICD-10-CM

## 2014-12-26 DIAGNOSIS — M25621 Stiffness of right elbow, not elsewhere classified: Secondary | ICD-10-CM

## 2014-12-26 NOTE — Therapy (Signed)
[place], [place], [address] Phone: [phone]   Fax:  [phone]  Physical Therapy Treatment  Patient Details  Name: Marilyn Cook MRN: [id number] Date of Birth: [birth year] Referring Provider: Rodell Perna , MD  Encounter Date: 12/26/2014      PT End of Session - 12/26/14 1434    Visit Number 11   Number of Visits 16   Date for PT Re-Evaluation 01/09/15   Authorization Type [facility]   PT Start Time [id number]   PT Stop Time 1505   PT Time Calculation (min) 50 min   Activity Tolerance Patient tolerated treatment well      Past Medical History  Diagnosis Date  . Hyperlipemia   . NSVD (normal spontaneous vaginal delivery)     x 1  . Benign tumor of breast 1975  . Scalp psoriasis   . Osteopenia 08/2012    spine WNL, hip T -1.2  . TIA (transient ischemic attack) 06/08/2014  . Complication of anesthesia     hard to wake up  . Stroke [facility])     possible TIA in 06-2014    Past Surgical History  Procedure Laterality Date  . Partial hysterectomy  1987    due to fibroids, ovaries remain  . R lat epicondyle injections  1998  . Spiral ct  04/02    (-)  . Dexa  08/2012    spine WNL, hip T -1.2  . Abdominal hysterectomy      There were no vitals filed for this visit.  Visit Diagnosis:  Weakness of right arm  Stiffness of upper arm joint, right  Activity intolerance  Pain in joint of right shoulder      Subjective Assessment - 12/26/14 1417    Subjective I was so sore after last visit for the last few days.  Upper arm soreness.   Currently in Pain? Yes   Pain Score 3    Pain Location Shoulder   Pain Orientation Right   Pain Type Surgical pain   Pain Onset More than a month ago   Pain Frequency Intermittent                         [facility] Adult PT Treatment/Exercise - 12/26/14 0001    Shoulder Exercises: Seated   Other Seated Exercises UE Ranger FW and back 20x   Shoulder  Exercises: Prone   Other Prone Exercises rows, shoulder extension 10x each   Shoulder Exercises: Standing   Protraction --  wall push up 15x   External Rotation Strengthening;Right;10 reps;Theraband   Theraband Level (Shoulder External Rotation) Level 1 (Yellow)   Internal Rotation Strengthening;Right;10 reps;Theraband   Theraband Level (Shoulder Internal Rotation) Level 1 (Yellow)   Extension Strengthening;Right;10 reps   Theraband Level (Shoulder Extension) Level 1 (Yellow)   Row Strengthening;Right;10 reps;Theraband   Theraband Level (Shoulder Row) Level 1 (Yellow)   Other Standing Exercises UE Ranger on small and medium steps 15x each   Other Standing Exercises UE Ranger on wall L4 and L6 and L8 10x   Shoulder Exercises: Pulleys   Flexion 3 minutes   Shoulder Exercises: Isometric Strengthening   Flexion 5X5"   ABduction 5X5"   Cryotherapy   Number Minutes Cryotherapy 10 Minutes   Cryotherapy Location Shoulder   Type of Cryotherapy Ice pack                  PT Short Term Goals -  12/26/14 1455    PT SHORT TERM GOAL #1   Title She will be independent with intial HEP   Status Achieved   PT SHORT TERM GOAL #2   Title She will have minimal pain with activity in protocol limits   Status Achieved   PT SHORT TERM GOAL #3   Title She will be able to perform active range with min pain per protocol   Status Achieved   PT SHORT TERM GOAL #4   Title She will progress to week 4 in protocol safely   Status Achieved           PT Long Term Goals - 12/26/14 1455    PT LONG TERM GOAL #1   Title She will be able to perform all HEP issued as of last visit   Time 8   Period Weeks   Status On-going   PT LONG TERM GOAL #2   Title she will be able to move RT shoulder to equal  Lt  AROM with min to no pain   Time 8   Period Weeks   Status On-going   PT LONG TERM GOAL #3   Title She will return to moderate home tasks with RT shoulder   Time 8   Period Weeks   Status  On-going   PT LONG TERM GOAL #4   Title she will return to normal work tasks   Time 8   Period Weeks   Status On-going   PT LONG TERM GOAL #5   Title She will return to normal self care with RT arm   Time 8   Period Weeks   Status On-going               Plan - 12/26/14 1435    Clinical Impression Statement Patient reports soreness in deltoid muscles.  Therapist closely monitoring and modifying secondary to soreness.  AAROM improving as anticipated s/p rotator cuff repair.     PT Next Visit Plan Give yellow band for HEP;  continue with UE Ranger on wall, try ball roll on wall; cold pack as needed;  Measure AAROM/AROM        Problem List Patient Active Problem List   Diagnosis Date Noted  . Chronic right shoulder pain 10/14/2014  . Preoperative clearance 10/11/2014  . TIA (transient ischemic attack) 06/08/2014  . Loss of weight 04/13/2014  . Palpitations 03/28/2013  . 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 Scalp psoriasis   . Urinary urgency 08/04/2012  . Left upper arm pain 12/30/2011  . Healthcare maintenance 07/31/2011  . Knee sprain and strain 07/31/2011  . Sleep disturbance 04/14/2011  . TENSION 04/18/2009  . LEG PAIN, BILATERAL 10/23/2008  . PROBLEMS WITH SWALLOWING AND MASTICATION 10/23/2008  . COLONIC POLYPS 10/27/2007  . DIVERTICULOSIS OF COLON 10/27/2007  . HYPERLIPIDEMIA 06/30/2006  . ALLERGY, ENVIRONMENTAL 06/30/2006  . Prediabetes 06/30/2006    Alvera Singh 12/26/2014, 2:58 PM  [facility] [address] [place], [place], [address] Phone: [phone]   Fax:  [phone]  Name: Marilyn Cook MRN: [id number] Date of Birth: 10/09/1945

## 2014-12-28 ENCOUNTER — Ambulatory Visit: Payer: 59 | Admitting: Physical Therapy

## 2014-12-28 DIAGNOSIS — M25621 Stiffness of right elbow, not elsewhere classified: Secondary | ICD-10-CM

## 2014-12-28 DIAGNOSIS — R29898 Other symptoms and signs involving the musculoskeletal system: Secondary | ICD-10-CM

## 2014-12-28 DIAGNOSIS — R6889 Other general symptoms and signs: Secondary | ICD-10-CM

## 2014-12-28 DIAGNOSIS — M25511 Pain in right shoulder: Secondary | ICD-10-CM

## 2014-12-28 NOTE — Patient Instructions (Signed)
Strengthening: Resisted Internal Rotation   Hold tubing in left hand, elbow at side and forearm out. Rotate forearm in across body. Repeat __10__ times per set. Do __1__ sets per session. Do __1__ sessions per day.  http://orth.exer.us/830   Copyright  VHI. All rights reserved.  Strengthening: Resisted External Rotation   Hold tubing in right hand, elbow at side and forearm across body. Rotate forearm out. Repeat _10___ times per set. Do __1__ sets per session. Do __1__ sessions per day.  http://orth.exer.us/828   Copyright  VHI. All rights reserved.  Strengthening: Resisted Flexion   Hold tubing with left arm at side. Pull forward and up. Move shoulder through pain-free range of motion. Repeat __10__ times per set. Do __1__ sets per session. Do __1__ sessions per day.  http://orth.exer.us/824   Copyright  VHI. All rights reserved.  Strengthening: Resisted Extension   Hold tubing in right hand, arm forward. Pull arm back, elbow straight. Repeat __10__ times per set. Do __1__ sets per session. Do __1__ sessions per day.  http://orth.exer.us/832   Copyright  VHI. All rights reserved.   

## 2014-12-28 NOTE — Therapy (Signed)
[place], [place], [address] Phone: [phone]   Fax:  [phone]  Physical Therapy Treatment  Patient Details  Name: Marilyn Cook MRN: [id number] Date of Birth: [birth year] Referring Provider: Rodell Perna , MD  Encounter Date: 12/28/2014      PT End of Session - 12/28/14 1413    Visit Number 12   Number of Visits 16   Date for PT Re-Evaluation 01/09/15   Authorization Type [facility]   PT Start Time 1410   PT Stop Time 1506   PT Time Calculation (min) 56 min   Activity Tolerance Patient tolerated treatment well      Past Medical History  Diagnosis Date  . Hyperlipemia   . NSVD (normal spontaneous vaginal delivery)     x 1  . Benign tumor of breast 1975  . Scalp psoriasis   . Osteopenia 08/2012    spine WNL, hip T -1.2  . TIA (transient ischemic attack) 06/08/2014  . Complication of anesthesia     hard to wake up  . Stroke [facility])     possible TIA in 06-2014    Past Surgical History  Procedure Laterality Date  . Partial hysterectomy  1987    due to fibroids, ovaries remain  . R lat epicondyle injections  1998  . Spiral ct  04/02    (-)  . Dexa  08/2012    spine WNL, hip T -1.2  . Abdominal hysterectomy      There were no vitals filed for this visit.  Visit Diagnosis:  Weakness of right arm  Stiffness of upper arm joint, right  Activity intolerance  Pain in joint of right shoulder      Subjective Assessment - 12/28/14 1412    Subjective Patient arrives in sling.  It's still sore "but not bad."  3/10 per patient report.  I can't put my hand in my pocket.     Currently in Pain? Yes   Pain Score 3    Pain Orientation Right   Pain Type Surgical pain   Pain Onset More than a month ago   Pain Frequency Intermittent   Aggravating Factors  moving too much            [facility] PT Assessment - 12/28/14 1420    AROM   Right/Left Shoulder Right   Right Shoulder Flexion 130 Degrees    Right Shoulder ABduction 125 Degrees   Right Shoulder Internal Rotation --  right buttock   Right Shoulder External Rotation 53 Degrees                     [facility] Adult PT Treatment/Exercise - 12/28/14 1416    Shoulder Exercises: Supine   Protraction Strengthening;Right;15 reps;Weights   Protraction Weight (lbs) 1   Flexion AROM;Right;10 reps   Theraband Level (Shoulder Flexion) --  on incline wedge and flat 10x each   Shoulder Exercises: Prone   Retraction AROM   Extension AROM   Other Prone Exercises rows, shoulder extension 10x 2 each   Shoulder Exercises: Sidelying   External Rotation Strengthening;Right;15 reps   External Rotation Weight (lbs) 1   Shoulder Exercises: Standing   Protraction --  wall push up 15x   External Rotation Strengthening;Right;10 reps;Theraband   Theraband Level (Shoulder External Rotation) Level 1 (Yellow)   Internal Rotation Strengthening;Right;10 reps;Theraband   Theraband Level (Shoulder Internal Rotation) Level 1 (Yellow)   Flexion AAROM;Both;10 reps   Extension Strengthening;Right;10  reps   Theraband Level (Shoulder Extension) Level 1 (Yellow)   Row Strengthening;Right;10 reps;Theraband   Theraband Level (Shoulder Row) Level 1 (Yellow)   Other Standing Exercises UE Ranger on small and medium steps 15x each   Other Standing Exercises UE Ranger on wall L 10, 12                PT Education - 12/28/14 1438    Education provided Yes   Education Details [place] yellow band   Person(s) Educated Patient   Methods Explanation;Demonstration;Handout   Comprehension Verbalized understanding;Returned demonstration          PT Short Term Goals - 12/28/14 1439    PT SHORT TERM GOAL #1   Title She will be independent with intial HEP   Status Achieved   PT SHORT TERM GOAL #2   Title She will have minimal pain with activity in protocol limits   Status Achieved   PT SHORT TERM GOAL #3   Title She will be able to perform  active range with min pain per protocol   Status Achieved   PT SHORT TERM GOAL #4   Title She will progress to week 4 in protocol safely   Status Achieved           PT Long Term Goals - 12/28/14 1452    PT LONG TERM GOAL #1   Title She will be able to perform all HEP issued as of last visit   Time 8   Period Weeks   Status On-going   PT LONG TERM GOAL #2   Title she will be able to move RT shoulder to equal  Lt  AROM with min to no pain   Time 8   Period Weeks   Status On-going   PT LONG TERM GOAL #3   Title She will return to moderate home tasks with RT shoulder   Time 8   Period Weeks   Status On-going   PT LONG TERM GOAL #4   Title she will return to normal work tasks   Time 8   Period Weeks   Status On-going   PT LONG TERM GOAL #5   Title She will return to normal self care with RT arm   Time 8   Period Weeks   Status On-going               Plan - 12/28/14 1453    Clinical Impression Statement Reports dull soreness in shoulder with exercise.  Verbal and tactile cues to decrease shoulder hike.  AROM improving as expected for this length time from surgery.  Therapist closely monitoring for pain, proper technique and safe progression s/p rotator cuff repair .     PT Next Visit Plan AROM on incline in supine;   check carryover with yellow band rows, ext, IR, ER, low level flexion; continue with UE Ranger on wall and ball roll on wall; cold pack as needed   PT Home Exercise Plan modified [place] yellow band        Problem List Patient Active Problem List   Diagnosis Date Noted  . Chronic right shoulder pain 10/14/2014  . Preoperative clearance 10/11/2014  . TIA (transient ischemic attack) 06/08/2014  . Loss of weight 04/13/2014  . Palpitations 03/28/2013  . 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 Scalp psoriasis   . Urinary urgency 08/04/2012  . Left upper arm pain 12/30/2011  . Healthcare maintenance 07/31/2011  . Knee sprain and strain 07/31/2011  .  Sleep  disturbance 04/14/2011  . TENSION 04/18/2009  . LEG PAIN, BILATERAL 10/23/2008  . PROBLEMS WITH SWALLOWING AND MASTICATION 10/23/2008  . COLONIC POLYPS 10/27/2007  . DIVERTICULOSIS OF COLON 10/27/2007  . HYPERLIPIDEMIA 06/30/2006  . ALLERGY, ENVIRONMENTAL 06/30/2006  . Prediabetes 06/30/2006    Alvera Singh 12/28/2014, 2:59 PM  [facility] [address] [place], [place], [address] Phone: [phone]   Fax:  [phone]  Name: JANENE QUARTUCCIO MRN: [id number] Date of Birth: 11-26-1945    Ruben Im, PT 12/28/2014 3:00 PM Phone: [phone] Fax: [phone]

## 2015-01-02 ENCOUNTER — Ambulatory Visit: Payer: 59 | Admitting: Physical Therapy

## 2015-01-02 DIAGNOSIS — R29898 Other symptoms and signs involving the musculoskeletal system: Secondary | ICD-10-CM

## 2015-01-02 DIAGNOSIS — M25511 Pain in right shoulder: Secondary | ICD-10-CM

## 2015-01-02 DIAGNOSIS — M25621 Stiffness of right elbow, not elsewhere classified: Secondary | ICD-10-CM

## 2015-01-02 DIAGNOSIS — R6889 Other general symptoms and signs: Secondary | ICD-10-CM

## 2015-01-02 NOTE — Therapy (Signed)
[place], [place], [address] Phone: [phone]   Fax:  [phone]  Physical Therapy Treatment  Patient Details  Name: Marilyn Cook MRN: [id number] Date of Birth: May 28, 1945 Referring Provider: Rodell Perna , MD  Encounter Date: 01/02/2015      PT End of Session - 01/02/15 1525    Visit Number 13   Number of Visits 16   Date for PT Re-Evaluation 01/09/15   Authorization Type [facility]   PT Start Time 1330   PT Stop Time 1417   PT Time Calculation (min) 47 min   Activity Tolerance Patient tolerated treatment well   Behavior During Therapy [facility] for tasks assessed/performed      Past Medical History  Diagnosis Date  . Hyperlipemia   . NSVD (normal spontaneous vaginal delivery)     x 1  . Benign tumor of breast 1975  . Scalp psoriasis   . Osteopenia 08/2012    spine WNL, hip T -1.2  . TIA (transient ischemic attack) 06/08/2014  . Complication of anesthesia     hard to wake up  . Stroke [facility])     possible TIA in 06-2014    Past Surgical History  Procedure Laterality Date  . Partial hysterectomy  1987    due to fibroids, ovaries remain  . R lat epicondyle injections  1998  . Spiral ct  04/02    (-)  . Dexa  08/2012    spine WNL, hip T -1.2  . Abdominal hysterectomy      There were no vitals filed for this visit.  Visit Diagnosis:  Weakness of right arm  Stiffness of upper arm joint, right  Activity intolerance  Pain in joint of right shoulder      Subjective Assessment - 01/02/15 1332    Subjective Overall feeling okay, was at work earlier today.    Currently in Pain? No/denies   Aggravating Factors  doing too much                         [facility] Adult PT Treatment/Exercise - 01/02/15 0001    Shoulder Exercises: Supine   Protraction Strengthening;Right;Weights;10 reps   Protraction Weight (lbs) 2   External Rotation PROM;Right;15 reps   Internal Rotation  PROM;Right;15 reps   Flexion AROM;Right;15 reps   Other Supine Exercises semi-reclined active flexion 1X15   Shoulder Exercises: Prone   Other Prone Exercises rows, shoulder extension 10x 2 each   Shoulder Exercises: Standing   External Rotation Strengthening;Right;10 reps;Theraband;Other (comment)  2 sets   Theraband Level (Shoulder External Rotation) Level 1 (Yellow)   Internal Rotation Strengthening;Right;10 reps;Theraband;Other (comment)  2 sets   Theraband Level (Shoulder Internal Rotation) Level 1 (Yellow)   Extension Strengthening;Right;10 reps;Other (comment)  2 sets   Theraband Level (Shoulder Extension) Level 1 (Yellow)   Other Standing Exercises UE ranger into flexion on wall   Other Standing Exercises UE ranger on 6 inch step - flexion/ext, horizontal add/abd                PT Education - 01/02/15 1524    Education provided Yes   Education Details review of HEP technique   Person(s) Educated Patient   Methods Explanation;Demonstration;Tactile cues;Verbal cues   Comprehension Verbalized understanding;Returned demonstration          PT Short Term Goals - 12/28/14 1439    PT SHORT TERM GOAL #1   Title She will  be independent with intial HEP   Status Achieved   PT SHORT TERM GOAL #2   Title She will have minimal pain with activity in protocol limits   Status Achieved   PT SHORT TERM GOAL #3   Title She will be able to perform active range with min pain per protocol   Status Achieved   PT SHORT TERM GOAL #4   Title She will progress to week 4 in protocol safely   Status Achieved           PT Long Term Goals - 12/28/14 1452    PT LONG TERM GOAL #1   Title She will be able to perform all HEP issued as of last visit   Time 8   Period Weeks   Status On-going   PT LONG TERM GOAL #2   Title she will be able to move RT shoulder to equal  Lt  AROM with min to no pain   Time 8   Period Weeks   Status On-going   PT LONG TERM GOAL #3   Title She will  return to moderate home tasks with RT shoulder   Time 8   Period Weeks   Status On-going   PT LONG TERM GOAL #4   Title she will return to normal work tasks   Time 8   Period Weeks   Status On-going   PT LONG TERM GOAL #5   Title She will return to normal self care with RT arm   Time 8   Period Weeks   Status On-going               Plan - 01/02/15 1526    Clinical Impression Statement Reporting that she feels like she is making steady progress. Needing verbal and tactile cues for correct technique with exercise program. Patient would benefit from further review of HEP. Overall patient is making gradual progress but has contnued deficits with ROM and strength.    PT Next Visit Plan Review HEP with cues for technique as needed.    PT Home Exercise Plan modified [place] yellow band   Consulted and Agree with Plan of Care Patient        Problem List Patient Active Problem List   Diagnosis Date Noted  . Chronic right shoulder pain 10/14/2014  . Preoperative clearance 10/11/2014  . TIA (transient ischemic attack) 06/08/2014  . Loss of weight 04/13/2014  . Palpitations 03/28/2013  . 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 Scalp psoriasis   . Urinary urgency 08/04/2012  . Left upper arm pain 12/30/2011  . Healthcare maintenance 07/31/2011  . Knee sprain and strain 07/31/2011  . Sleep disturbance 04/14/2011  . TENSION 04/18/2009  . LEG PAIN, BILATERAL 10/23/2008  . PROBLEMS WITH SWALLOWING AND MASTICATION 10/23/2008  . COLONIC POLYPS 10/27/2007  . DIVERTICULOSIS OF COLON 10/27/2007  . HYPERLIPIDEMIA 06/30/2006  . ALLERGY, ENVIRONMENTAL 06/30/2006  . Prediabetes 06/30/2006    Linard Millers, PT 01/02/2015, 3:30 PM  [facility] [address] [place], [place], [address] Phone: [phone]   Fax:  [phone]  Name: VADA LARTIGUE MRN: [id number] Date of Birth: [birth year]

## 2015-01-04 ENCOUNTER — Ambulatory Visit: Payer: 59 | Admitting: Physical Therapy

## 2015-01-04 DIAGNOSIS — R6889 Other general symptoms and signs: Secondary | ICD-10-CM

## 2015-01-04 DIAGNOSIS — M25511 Pain in right shoulder: Secondary | ICD-10-CM

## 2015-01-04 DIAGNOSIS — M25621 Stiffness of right elbow, not elsewhere classified: Secondary | ICD-10-CM

## 2015-01-04 DIAGNOSIS — R29898 Other symptoms and signs involving the musculoskeletal system: Secondary | ICD-10-CM

## 2015-01-04 NOTE — Therapy (Addendum)
[place], [place], [address] Phone: [phone]   Fax:  [phone]  Physical Therapy Treatment  Patient Details  Name: Marilyn Cook MRN: [id number] Date of Birth: 06-10-1945 Referring Provider: Rodell Perna , MD  Encounter Date: 01/04/2015      PT End of Session - 01/04/15 1417    Visit Number 14   Number of Visits 22   Date for PT Re-Evaluation 02/15/15   Authorization Type [facility]   PT Start Time 1330   PT Stop Time 1426   PT Time Calculation (min) 56 min   Activity Tolerance Patient tolerated treatment well      Past Medical History  Diagnosis Date  . Hyperlipemia   . NSVD (normal spontaneous vaginal delivery)     x 1  . Benign tumor of breast 1975  . Scalp psoriasis   . Osteopenia 08/2012    spine WNL, hip T -1.2  . TIA (transient ischemic attack) 06/08/2014  . Complication of anesthesia     hard to wake up  . Stroke [facility])     possible TIA in 06-2014    Past Surgical History  Procedure Laterality Date  . Partial hysterectomy  1987    due to fibroids, ovaries remain  . R lat epicondyle injections  1998  . Spiral ct  04/02    (-)  . Dexa  08/2012    spine WNL, hip T -1.2  . Abdominal hysterectomy      There were no vitals filed for this visit.  Visit Diagnosis:  Weakness of right arm - Plan: PT plan of care cert/re-cert  Stiffness of upper arm joint, right - Plan: PT plan of care cert/re-cert  Activity intolerance - Plan: PT plan of care cert/re-cert  Pain in joint of right shoulder - Plan: PT plan of care cert/re-cert      Subjective Assessment - 01/04/15 1333    Subjective Reports her upper arm feels weak.  MD 1/24 follow up.  Denies excessive pain or soreness after last treatment session.   Currently in Pain? Yes   Pain Score 3    Pain Location Shoulder   Pain Orientation Right   Pain Type Surgical pain   Pain Onset More than a month ago   Pain Frequency  Intermittent   Aggravating Factors  sitting in a long meeting            [facility] PT Assessment - 01/04/15 1339    AROM   Right Shoulder Flexion 141 Degrees   Right Shoulder ABduction 135 Degrees   Right Shoulder Internal Rotation --  mid sacrum   Right Shoulder External Rotation 65 Degrees   Strength   Overall Strength Comments right grossly 2+/5                     [facility] Adult PT Treatment/Exercise - 01/04/15 1346    Shoulder Exercises: Supine   Protraction Strengthening;Right;15 reps;Weights   Protraction Weight (lbs) 2   Flexion Strengthening;Right;10 reps;Weights   Shoulder Flexion Weight (lbs) 1   Other Supine Exercises short arc 2# 12/6 and 3/9   Shoulder Exercises: Prone   Extension Strengthening;Right;15 reps;Weights   Extension Weight (lbs) 1   Horizontal ABduction 1 Strengthening;Right;15 reps   Horizontal ABduction 1 Weight (lbs) 0   Other Prone Exercises rows 1# 15x   Shoulder Exercises: Sidelying   External Rotation Strengthening;Right;20 reps;Weights   External Rotation Weight (lbs) 1  ABduction Strengthening;Right;10 reps   ABduction Weight (lbs) 0   Shoulder Exercises: Standing   External Rotation Strengthening;Right;10 reps;Theraband;Other (comment)  2 sets   Theraband Level (Shoulder External Rotation) Level 1 (Yellow)   Internal Rotation Strengthening;Right;10 reps;Theraband;Other (comment)  2 sets   Theraband Level (Shoulder Internal Rotation) Level 1 (Yellow)   Extension Strengthening;Right;10 reps;Other (comment)  2 sets   Theraband Level (Shoulder Extension) Level 1 (Yellow)   Row Strengthening;Right;10 reps;Theraband   Theraband Level (Shoulder Row) Level 1 (Yellow)   Other Standing Exercises UE Ranger L10 and L12 15x each                  PT Short Term Goals - 01/04/15 1407    PT SHORT TERM GOAL #1   Title She will be independent with intial HEP   Status Achieved   PT SHORT TERM GOAL #2   Title She will have  minimal pain with activity in protocol limits   Status Achieved   PT SHORT TERM GOAL #3   Title She will be able to perform active range with min pain per protocol   Status Achieved   PT SHORT TERM GOAL #4   Title She will progress to week 4 in protocol safely   Status Achieved           PT Long Term Goals - 01/04/15 1407    PT LONG TERM GOAL #1   Title She will be able to perform all HEP issued as of last visit   Time 8   Period Weeks   PT LONG TERM GOAL #2   Title she will be able to move RT shoulder to 160 degrees needed for reaching a shelf that is just above eye level   Time 8   Period Weeks   Status On-going   PT LONG TERM GOAL #3   Title She will return to moderate home tasks with RT shoulder   Time 8   Period Weeks   Status On-going   PT LONG TERM GOAL #4   Title she will return to normal work tasks   Time 8   Period Weeks   Status On-going   PT LONG TERM GOAL #5   Title She will return to normal self care with RT arm   Status Achieved   Additional Long Term Goals   Additional Long Term Goals Yes   PT LONG TERM GOAL #6   Title Right glenohumeral and periscapular strength improved to 4/5 needed for work and home tasks   Time 8   Period Weeks   Status New   PT LONG TERM GOAL #7   Title The patient will be able to lift 2# object to eye level shelf   Time 8   Period [place] - 01/04/15 1405    Clinical Impression Statement The patient is progressing as expected s/p right rotator cuff repair.  She continues to need verbal and tactile cues for proper technique with exercises.  Her AROM is continues to increase steadily:  flex 141, abd 135, ER 65, IR behind back to sacral region.  Compensatory shoulder hike with abduction. Her strength is grossly 2+/5 in available ROM.  Her pain continues to be mild.  She has not begun to use her right UE functionally yet.  She continues to work light duty but may return to full duty next week.  She would benefit from PT for additional ROM and strength needed for functional use.  She should meet remaining LTGs and new functional goals within 6 weeks.     Pt will benefit from skilled therapeutic intervention in order to improve on the following deficits Impaired UE functional use;Pain;Decreased strength;Decreased activity tolerance;Decreased range of motion   Rehab Potential Good   PT Frequency 2x / week   PT Duration 6 weeks   PT Treatment/Interventions ADLs/Self Care Home Management;Cryotherapy;Vasopneumatic Device;Therapeutic exercise;Manual techniques;Taping;Patient/family education;Passive range of motion   PT Next Visit Plan add supraspinatus low level neuromuscular re-ed/eccentric lowers; continue gradual progression of ROM and strengthening        Problem List Patient Active Problem List   Diagnosis Date Noted  . Chronic right shoulder pain 10/14/2014  . Preoperative clearance 10/11/2014  . TIA (transient ischemic attack) 06/08/2014  . Loss of weight 04/13/2014  . Palpitations 03/28/2013  . 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 Scalp psoriasis   . Urinary urgency 08/04/2012  . Left upper arm pain 12/30/2011  . Healthcare maintenance 07/31/2011  . Knee sprain and strain 07/31/2011  . Sleep disturbance 04/14/2011  . TENSION 04/18/2009  . LEG PAIN, BILATERAL 10/23/2008  . PROBLEMS WITH SWALLOWING AND MASTICATION 10/23/2008  . COLONIC POLYPS 10/27/2007  . DIVERTICULOSIS OF COLON 10/27/2007  . HYPERLIPIDEMIA 06/30/2006  . ALLERGY, ENVIRONMENTAL 06/30/2006  . Prediabetes 06/30/2006    Alvera Singh 01/04/2015, 2:33 PM  [facility] [address] [place], [place], [address] Phone: [phone]   Fax:  [phone]  Name: Marilyn Cook MRN: [id number] Date of Birth: Jul 04, 1945    Ruben Im, PT 01/04/2015 2:33 PM Phone: [phone] Fax: [phone]                                                                                                                                          PHYSICAL THERAPY DISCHARGE SUMMARY  Visits from Start of Care: 14  Current functional level related to goals / functional outcomes: See above   Remaining deficits: Unknown as she did not return after this visit   Education / Equipment: HEP  Plan:                                                    Patient goals were partially met. Patient is being discharged due to not returning since the last visit.  ?????   Darrel Hoover, PT   02/12/15       2:20 PM

## 2015-01-08 ENCOUNTER — Other Ambulatory Visit: Payer: Self-pay | Admitting: Family Medicine

## 2015-01-08 DIAGNOSIS — R7303 Prediabetes: Secondary | ICD-10-CM

## 2015-01-08 DIAGNOSIS — E785 Hyperlipidemia, unspecified: Secondary | ICD-10-CM

## 2015-01-10 ENCOUNTER — Telehealth: Payer: Self-pay | Admitting: *Deleted

## 2015-01-10 ENCOUNTER — Other Ambulatory Visit (INDEPENDENT_AMBULATORY_CARE_PROVIDER_SITE_OTHER): Payer: 59

## 2015-01-10 DIAGNOSIS — R7303 Prediabetes: Secondary | ICD-10-CM | POA: Diagnosis not present

## 2015-01-10 DIAGNOSIS — E785 Hyperlipidemia, unspecified: Secondary | ICD-10-CM

## 2015-01-10 LAB — LIPID PANEL
CHOLESTEROL: 156 mg/dL (ref 0–200)
HDL: 46.3 mg/dL (ref >39.00)
LDL CALC: 94 mg/dL (ref 0–99)
NonHDL: 109.27
TRIGLYCERIDES: 74 mg/dL (ref 0.0–149.0)
Total CHOL/HDL Ratio: 3
VLDL: 14.8 mg/dL (ref 0.0–40.0)

## 2015-01-10 LAB — BASIC METABOLIC PANEL
BUN: 15 mg/dL (ref 6–23)
CALCIUM: 9.6 mg/dL (ref 8.4–10.5)
CO2: 27 meq/L (ref 19–32)
CREATININE: 0.71 mg/dL (ref 0.40–1.20)
Chloride: 104 mEq/L (ref 96–112)
GFR: 86.43 mL/min (ref >60.00)
GLUCOSE: 89 mg/dL (ref 70–99)
Potassium: 3.8 mEq/L (ref 3.5–5.1)
Sodium: 142 mEq/L (ref 135–145)

## 2015-01-10 LAB — HEMOGLOBIN A1C: HEMOGLOBIN A1C: 5.6 % (ref 4.6–6.5)

## 2015-01-10 NOTE — Telephone Encounter (Signed)
Ok to resume regular dosing tonight. Don't take am dose today.

## 2015-01-10 NOTE — Telephone Encounter (Signed)
Patient notified and verbalized understanding. 

## 2015-01-10 NOTE — Telephone Encounter (Signed)
Pt was here for labs this am, and she mentioned that she had a headache last night and took a tyl, but couldn't remember if she had taken her blood thinner last night. She called pharmacy and pharmacist recommended she not take it just in case she already had. This am she thinks she didn't take it last night and wanted to know if she should take it this am. I advised that it's generally not recommended to make up a dose, and that she take tonight's dose at it it's usual time. Do you advise differently? She request a call back and says message can be left on her home machine.

## 2015-01-15 ENCOUNTER — Encounter: Payer: 59 | Admitting: Family Medicine

## 2015-01-16 ENCOUNTER — Encounter: Payer: 59 | Admitting: Physical Therapy

## 2015-01-16 ENCOUNTER — Encounter: Payer: 59 | Admitting: Family Medicine

## 2015-01-18 ENCOUNTER — Encounter: Payer: 59 | Admitting: Physical Therapy

## 2015-01-19 ENCOUNTER — Encounter: Payer: 59 | Admitting: Family Medicine

## 2015-01-23 ENCOUNTER — Ambulatory Visit (INDEPENDENT_AMBULATORY_CARE_PROVIDER_SITE_OTHER): Payer: 59 | Admitting: Family Medicine

## 2015-01-23 ENCOUNTER — Encounter: Payer: Self-pay | Admitting: Gastroenterology

## 2015-01-23 ENCOUNTER — Encounter: Payer: Self-pay | Admitting: Family Medicine

## 2015-01-23 VITALS — BP 130/80 | HR 80 | Temp 97.5°F | Wt 131.5 lb

## 2015-01-23 DIAGNOSIS — Z23 Encounter for immunization: Secondary | ICD-10-CM | POA: Diagnosis not present

## 2015-01-23 DIAGNOSIS — R7303 Prediabetes: Secondary | ICD-10-CM

## 2015-01-23 DIAGNOSIS — Z0000 Encounter for general adult medical examination without abnormal findings: Secondary | ICD-10-CM | POA: Diagnosis not present

## 2015-01-23 DIAGNOSIS — E785 Hyperlipidemia, unspecified: Secondary | ICD-10-CM

## 2015-01-23 DIAGNOSIS — G8929 Other chronic pain: Secondary | ICD-10-CM

## 2015-01-23 DIAGNOSIS — G459 Transient cerebral ischemic attack, unspecified: Secondary | ICD-10-CM

## 2015-01-23 DIAGNOSIS — Z7189 Other specified counseling: Secondary | ICD-10-CM

## 2015-01-23 DIAGNOSIS — D126 Benign neoplasm of colon, unspecified: Secondary | ICD-10-CM

## 2015-01-23 DIAGNOSIS — M25511 Pain in right shoulder: Secondary | ICD-10-CM

## 2015-01-23 NOTE — Progress Notes (Signed)
Pre visit review using our clinic review tool, if applicable. No additional management support is needed unless otherwise documented below in the visit note. 

## 2015-01-23 NOTE — Assessment & Plan Note (Signed)
S/p recent arthroscopy and RTC repair, currently undergoing rehab therapy

## 2015-01-23 NOTE — Patient Instructions (Addendum)
Someone will call to schedule colonoscopy. prevnar today. Call your insurance about the shingles shot to see if it is covered or how much it would cost and where is cheaper (here or pharmacy).  If you want to receive here, call for nurse visit.  Advanced directive packet provided today Return as needed or in 6 months for follow up.  Health Maintenance, Female Adopting a healthy lifestyle and getting preventive care can go a long way to promote health and wellness. Talk with your health care provider about what schedule of regular examinations is right for you. This is a good chance for you to check in with your provider about disease prevention and staying healthy. In between checkups, there are plenty of things you can do on your own. Experts have done a lot of research about which lifestyle changes and preventive measures are most likely to keep you healthy. Ask your health care provider for more information. WEIGHT AND DIET  Eat a healthy diet  Be sure to include plenty of vegetables, fruits, low-fat dairy products, and lean protein.  Do not eat a lot of foods high in solid fats, added sugars, or salt.  Get regular exercise. This is one of the most important things you can do for your health.  Most adults should exercise for at least 150 minutes each week. The exercise should increase your heart rate and make you sweat (moderate-intensity exercise).  Most adults should also do strengthening exercises at least twice a week. This is in addition to the moderate-intensity exercise.  Maintain a healthy weight  Body mass index (BMI) is a measurement that can be used to identify possible weight problems. It estimates body fat based on height and weight. Your health care provider can help determine your BMI and help you achieve or maintain a healthy weight.  For females 19 years of age and older:   A BMI below 18.5 is considered underweight.  A BMI of 18.5 to 24.9 is normal.  A BMI of 25  to 29.9 is considered overweight.  A BMI of 30 and above is considered obese.  Watch levels of cholesterol and blood lipids  You should start having your blood tested for lipids and cholesterol at 71 years of age, then have this test every 5 years.  You may need to have your cholesterol levels checked more often if:  Your lipid or cholesterol levels are high.  You are older than 71 years of age.  You are at high risk for heart disease.  CANCER SCREENING   Lung Cancer  Lung cancer screening is recommended for adults 47-12 years old who are at high risk for lung cancer because of a history of smoking.  A yearly low-dose CT scan of the lungs is recommended for people who:  Currently smoke.  Have quit within the past 15 years.  Have at least a 30-pack-year history of smoking. A pack year is smoking an average of one pack of cigarettes a day for 1 year.  Yearly screening should continue until it has been 15 years since you quit.  Yearly screening should stop if you develop a health problem that would prevent you from having lung cancer treatment.  Breast Cancer  Practice breast self-awareness. This means understanding how your breasts normally appear and feel.  It also means doing regular breast self-exams. Let your health care provider know about any changes, no matter how small.  If you are in your 20s or 30s, you should have a  clinical breast exam (CBE) by a health care provider every 1-3 years as part of a regular health exam.  If you are 48 or older, have a CBE every year. Also consider having a breast X-ray (mammogram) every year.  If you have a family history of breast cancer, talk to your health care provider about genetic screening.  If you are at high risk for breast cancer, talk to your health care provider about having an MRI and a mammogram every year.  Breast cancer gene (BRCA) assessment is recommended for women who have family members with BRCA-related  cancers. BRCA-related cancers include:  Breast.  Ovarian.  Tubal.  Peritoneal cancers.  Results of the assessment will determine the need for genetic counseling and BRCA1 and BRCA2 testing. Cervical Cancer Your health care provider may recommend that you be screened regularly for cancer of the pelvic organs (ovaries, uterus, and vagina). This screening involves a pelvic examination, including checking for microscopic changes to the surface of your cervix (Pap test). You may be encouraged to have this screening done every 3 years, beginning at age 70.  For women ages 32-65, health care providers may recommend pelvic exams and Pap testing every 3 years, or they may recommend the Pap and pelvic exam, combined with testing for human papilloma virus (HPV), every 5 years. Some types of HPV increase your risk of cervical cancer. Testing for HPV may also be done on women of any age with unclear Pap test results.  Other health care providers may not recommend any screening for nonpregnant women who are considered low risk for pelvic cancer and who do not have symptoms. Ask your health care provider if a screening pelvic exam is right for you.  If you have had past treatment for cervical cancer or a condition that could lead to cancer, you need Pap tests and screening for cancer for at least 20 years after your treatment. If Pap tests have been discontinued, your risk factors (such as having a new sexual partner) need to be reassessed to determine if screening should resume. Some women have medical problems that increase the chance of getting cervical cancer. In these cases, your health care provider may recommend more frequent screening and Pap tests. Colorectal Cancer  This type of cancer can be detected and often prevented.  Routine colorectal cancer screening usually begins at 71 years of age and continues through 71 years of age.  Your health care provider may recommend screening at an earlier  age if you have risk factors for colon cancer.  Your health care provider may also recommend using home test kits to check for hidden blood in the stool.  A small camera at the end of a tube can be used to examine your colon directly (sigmoidoscopy or colonoscopy). This is done to check for the earliest forms of colorectal cancer.  Routine screening usually begins at age 95.  Direct examination of the colon should be repeated every 5-10 years through 71 years of age. However, you may need to be screened more often if early forms of precancerous polyps or small growths are found. Skin Cancer  Check your skin from head to toe regularly.  Tell your health care provider about any new moles or changes in moles, especially if there is a change in a mole's shape or color.  Also tell your health care provider if you have a mole that is larger than the size of a pencil eraser.  Always use sunscreen. Apply sunscreen liberally  and repeatedly throughout the day.  Protect yourself by wearing long sleeves, pants, a wide-brimmed hat, and sunglasses whenever you are outside. HEART DISEASE, DIABETES, AND HIGH BLOOD PRESSURE   High blood pressure causes heart disease and increases the risk of stroke. High blood pressure is more likely to develop in:  People who have blood pressure in the high end of the normal range (130-139/85-89 mm Hg).  People who are overweight or obese.  People who are African American.  If you are 54-3 years of age, have your blood pressure checked every 3-5 years. If you are 35 years of age or older, have your blood pressure checked every year. You should have your blood pressure measured twice--once when you are at a hospital or clinic, and once when you are not at a hospital or clinic. Record the average of the two measurements. To check your blood pressure when you are not at a hospital or clinic, you can use:  An automated blood pressure machine at a pharmacy.  A home  blood pressure monitor.  If you are between 65 years and 91 years old, ask your health care provider if you should take aspirin to prevent strokes.  Have regular diabetes screenings. This involves taking a blood sample to check your fasting blood sugar level.  If you are at a normal weight and have a low risk for diabetes, have this test once every three years after 71 years of age.  If you are overweight and have a high risk for diabetes, consider being tested at a younger age or more often. PREVENTING INFECTION  Hepatitis B  If you have a higher risk for hepatitis B, you should be screened for this virus. You are considered at high risk for hepatitis B if:  You were born in a country where hepatitis B is common. Ask your health care provider which countries are considered high risk.  Your parents were born in a high-risk country, and you have not been immunized against hepatitis B (hepatitis B vaccine).  You have HIV or AIDS.  You use needles to inject street drugs.  You live with someone who has hepatitis B.  You have had sex with someone who has hepatitis B.  You get hemodialysis treatment.  You take certain medicines for conditions, including cancer, organ transplantation, and autoimmune conditions. Hepatitis C  Blood testing is recommended for:  Everyone born from [birth year] through 1966.  Anyone with known risk factors for hepatitis C. Sexually transmitted infections (STIs)  You should be screened for sexually transmitted infections (STIs) including gonorrhea and chlamydia if:  You are sexually active and are younger than 71 years of age.  You are older than 71 years of age and your health care provider tells you that you are at risk for this type of infection.  Your sexual activity has changed since you were last screened and you are at an increased risk for chlamydia or gonorrhea. Ask your health care provider if you are at risk.  If you do not have HIV, but are at  risk, it may be recommended that you take a prescription medicine daily to prevent HIV infection. This is called pre-exposure prophylaxis (PrEP). You are considered at risk if:  You are sexually active and do not regularly use condoms or know the HIV status of your partner(s).  You take drugs by injection.  You are sexually active with a partner who has HIV. Talk with your health care provider about whether you are  at high risk of being infected with HIV. If you choose to begin PrEP, you should first be tested for HIV. You should then be tested every 3 months for as long as you are taking PrEP.  PREGNANCY   If you are premenopausal and you may become pregnant, ask your health care provider about preconception counseling.  If you may become pregnant, take 400 to 800 micrograms (mcg) of folic acid every day.  If you want to prevent pregnancy, talk to your health care provider about birth control (contraception). OSTEOPOROSIS AND MENOPAUSE   Osteoporosis is a disease in which the bones lose minerals and strength with aging. This can result in serious bone fractures. Your risk for osteoporosis can be identified using a bone density scan.  If you are 40 years of age or older, or if you are at risk for osteoporosis and fractures, ask your health care provider if you should be screened.  Ask your health care provider whether you should take a calcium or vitamin D supplement to lower your risk for osteoporosis.  Menopause may have certain physical symptoms and risks.  Hormone replacement therapy may reduce some of these symptoms and risks. Talk to your health care provider about whether hormone replacement therapy is right for you.  HOME CARE INSTRUCTIONS   Schedule regular health, dental, and eye exams.  Stay current with your immunizations.   Do not use any tobacco products including cigarettes, chewing tobacco, or electronic cigarettes.  If you are pregnant, do not drink alcohol.  If  you are breastfeeding, limit how much and how often you drink alcohol.  Limit alcohol intake to no more than 1 drink per day for nonpregnant women. One drink equals 12 ounces of beer, 5 ounces of wine, or 1 ounces of hard liquor.  Do not use street drugs.  Do not share needles.  Ask your health care provider for help if you need support or information about quitting drugs.  Tell your health care provider if you often feel depressed.  Tell your health care provider if you have ever been abused or do not feel safe at home.   This information is not intended to replace advice given to you by your health care provider. Make sure you discuss any questions you have with your health care provider.   Document Released: 07/08/2010 Document Revised: 01/13/2014 Document Reviewed: 11/24/2012 Elsevier Interactive Patient Education [employer].

## 2015-01-23 NOTE — Assessment & Plan Note (Signed)
Advanced directives: discussed - HCPOA unsure will consider this. Needs to set up. Advanced directive packet provided today 

## 2015-01-23 NOTE — Assessment & Plan Note (Signed)
Chronic, stable. Continue pravastatin. 

## 2015-01-23 NOTE — Addendum Note (Signed)
Addended by: Royann Shivers A on: 01/23/2015 10:49 AM   Modules accepted: Orders

## 2015-01-23 NOTE — Addendum Note (Signed)
Addended by: Ria Bush on: 01/23/2015 10:33 AM   Modules accepted: Miquel Dunn

## 2015-01-23 NOTE — Assessment & Plan Note (Signed)
Will refer for rpt colonoscopy

## 2015-01-23 NOTE — Assessment & Plan Note (Signed)
This has resolved with recent weight loss.

## 2015-01-23 NOTE — Progress Notes (Signed)
BP 130/80 mmHg  Pulse 80  Temp(Src) 97.5 F (36.4 C) (Oral)  Wt 131 lb 8 oz (59.648 kg)   CC: CPE  Subjective:    Patient ID: Marilyn Cook, female    DOB: Apr 18, 1945, 70 y.o.   MRN: [id number]  HPI: Marilyn Cook is a 70 y.o. female presenting on 01/23/2015 for Annual Exam   Recent R shoulder arthroscopy.  Vision exam done last year.   Preventative: Colonoscopy (2009) diverticulosis, 2 mm sigmoid polyp, Olevia Perches). Rec rpt 7 yrs. Ready to reschedule. Mammogram 03/2014 - normal. Gets yearly Pap smear - bimanual only done 2015. Partial hysterectomy for benign reason (fibroids), ovaries remain.  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 Got this year at [facility] Pneumovax 07/31/2011, prevnar today zostavax - will consider. Advanced directives: discussed - HCPOA unsure will consider this. Needs to set up. Advanced directive packet provided today Seat belt use discussed Sunscreen use discussed - allergic to sunscreen. Discussed avoiding sun and wearing wide brimmed hat  Married and lives with husband One son Occupation: works at [employer] at [employer] Activity: no regular exercise, active through rehab Diet: some water, fruits/vegetables daily  Relevant past medical, surgical, family and social history reviewed and updated as indicated. Interim medical history since our last visit reviewed. Allergies and medications reviewed and updated. Current Outpatient Prescriptions on File Prior to Visit  Medication Sig  . Calcium Carb-Cholecalciferol (CALCIUM-VITAMIN D) 600-400 MG-UNIT TABS Take 1 tablet by mouth 2 (two) times daily.   . cetirizine (ZYRTEC) 10 MG tablet Take 10 mg by mouth daily.  . Cinnamon 500 MG capsule Take 1,000 mg by mouth 2 (two) times daily.   . clopidogrel (PLAVIX) 75 MG tablet Take 1 tablet (75 mg total) by mouth daily.  [name] co-enzyme Q-10 30 MG capsule Take 30 mg by mouth daily.  [name] CRANBERRY PO Take 1 tablet by mouth daily.   .  cyanocobalamin 100 MCG tablet Take 100 mcg by mouth daily.    . hydrocortisone 2.5 % cream Apply 1 application topically 2 (two) times daily as needed (itching).   . Misc Natural Products (BLACK CHERRY CONCENTRATE PO) Take 1 tablet by mouth daily.   . Potassium Chloride (K-TABS PO) Take 1 tablet by mouth daily.   . pravastatin (PRAVACHOL) 40 MG tablet TAKE 1 TABLET BY MOUTH ONCE DAILY  . Pyridoxine HCl (VITAMIN B-6 PO) Take 1 tablet by mouth daily.  [name] triamcinolone (KENALOG) 0.147 MG/GM topical spray Apply topically 2 (two) times daily as needed. For psoriasis   No current facility-administered medications on file prior to visit.    Review of Systems  Constitutional: Negative for fever, chills, activity change, appetite change, fatigue and unexpected weight change.  HENT: Negative for hearing loss.   Eyes: Negative for visual disturbance.  Respiratory: Negative for cough, chest tightness, shortness of breath and wheezing.   Cardiovascular: Negative for chest pain, palpitations and leg swelling.  Gastrointestinal: Negative for nausea, vomiting, abdominal pain, diarrhea, constipation, blood in stool and abdominal distention.  Genitourinary: Negative for hematuria and difficulty urinating.  Musculoskeletal: Negative for myalgias, arthralgias and neck pain.  Skin: Negative for rash.  Neurological: Positive for headaches (with blood thinner). Negative for dizziness, seizures and syncope.  Hematological: Negative for adenopathy. Does not bruise/bleed easily.  Psychiatric/Behavioral: Negative for dysphoric mood. The patient is not nervous/anxious.    Per HPI unless specifically indicated in ROS section     Objective:  BP 130/80 mmHg  Pulse 80  Temp(Src) 97.5 F (36.4 C) (Oral)  Wt 131 lb 8 oz (59.648 kg)  Wt Readings from Last 3 Encounters:  01/23/15 131 lb 8 oz (59.648 kg)  10/30/14 129 lb (58.514 kg)  10/19/14 130 lb (58.968 kg)    Physical Exam  Constitutional: She is oriented  to person, place, and time. She appears well-developed and well-nourished. No distress.  HENT:  Head: Normocephalic and atraumatic.  Right Ear: Hearing, tympanic membrane, external ear and ear canal normal.  Left Ear: Hearing, tympanic membrane, external ear and ear canal normal.  Nose: Nose normal.  Mouth/Throat: Uvula is midline, oropharynx is clear and moist and mucous membranes are normal. No oropharyngeal exudate, posterior oropharyngeal edema or posterior oropharyngeal erythema.  Eyes: Conjunctivae and EOM are normal. Pupils are equal, round, and reactive to light. No scleral icterus.  Neck: Normal range of motion. Neck supple. Carotid bruit is not present. No thyromegaly present.  Cardiovascular: Normal rate, regular rhythm, normal heart sounds and intact distal pulses.   No murmur heard. Pulses:      Radial pulses are 2+ on the right side, and 2+ on the left side.  Pulmonary/Chest: Effort normal and breath sounds normal. No respiratory distress. She has no wheezes. She has no rales. Right breast exhibits no inverted nipple, no mass, no nipple discharge and no skin change. Left breast exhibits no inverted nipple, no mass, no nipple discharge and no skin change.  Abdominal: Soft. Bowel sounds are normal. She exhibits no distension and no mass. There is no tenderness. There is no rebound and no guarding.  Musculoskeletal: Normal range of motion. She exhibits no edema.  Lymphadenopathy:       Head (right side): No submental, no submandibular, no tonsillar, no preauricular and no posterior auricular adenopathy present.       Head (left side): No submental, no submandibular, no tonsillar, no preauricular and no posterior auricular adenopathy present.    She has no cervical adenopathy.    She has no axillary adenopathy.       Right axillary: No lateral adenopathy present.       Left axillary: No lateral adenopathy present.      Right: No supraclavicular adenopathy present.       Left: No  supraclavicular adenopathy present.  Neurological: She is alert and oriented to person, place, and time.  CN grossly intact, station and gait intact  Skin: Skin is warm and dry. No rash noted.  Psychiatric: She has a normal mood and affect. Her behavior is normal. Judgment and thought content normal.  Nursing note and vitals reviewed.  Results for orders placed or performed in visit on 01/10/15  Lipid panel  Result Value Ref Range   Cholesterol 156 0 - 200 mg/dL   Triglycerides 74.0 0.0 - 149.0 mg/dL   HDL 46.30 >39.00 mg/dL   VLDL 14.8 0.0 - 40.0 mg/dL   LDL Cholesterol 94 0 - 99 mg/dL   Total CHOL/HDL Ratio 3    NonHDL 109.27   Hemoglobin A1c  Result Value Ref Range   Hgb A1c MFr Bld 5.6 4.6 - 6.5 %  Basic metabolic panel  Result Value Ref Range   Sodium 142 135 - 145 mEq/L   Potassium 3.8 3.5 - 5.1 mEq/L   Chloride 104 96 - 112 mEq/L   CO2 27 19 - 32 mEq/L   Glucose, Bld 89 70 - 99 mg/dL   BUN 15 6 - 23 mg/dL  Creatinine, Ser 0.71 0.40 - 1.20 mg/dL   Calcium 9.6 8.4 - 10.5 mg/dL   GFR 86.43 >60.00 mL/min      Assessment & Plan:   Problem List Items Addressed This Visit    TIA (transient ischemic attack)    Continues plavix. Discussed plavix use with vit E and fish oil.      RESOLVED: Prediabetes    This has resolved with recent weight loss.      HLD (hyperlipidemia)    Chronic, stable. Continue pravastatin.       Healthcare maintenance - Primary    Preventative protocols reviewed and updated unless pt declined. Discussed healthy diet and lifestyle.       COLONIC POLYPS    Will refer for rpt colonoscopy      Relevant Orders   Ambulatory referral to Gastroenterology   Chronic right shoulder pain    S/p recent arthroscopy and RTC repair, currently undergoing rehab therapy      Advanced care planning/counseling discussion    Advanced directives: discussed - HCPOA unsure will consider this. Needs to set up. Advanced directive packet provided today            Follow up plan: Return in about 6 months (around 07/23/2015), or as needed, for follow up visit.

## 2015-01-23 NOTE — Assessment & Plan Note (Signed)
Preventative protocols reviewed and updated unless pt declined. Discussed healthy diet and lifestyle.  

## 2015-01-23 NOTE — Assessment & Plan Note (Addendum)
Continues plavix. Discussed plavix use with vit E and fish oil.

## 2015-01-30 DIAGNOSIS — M25511 Pain in right shoulder: Secondary | ICD-10-CM | POA: Diagnosis not present

## 2015-01-30 DIAGNOSIS — M75101 Unspecified rotator cuff tear or rupture of right shoulder, not specified as traumatic: Secondary | ICD-10-CM | POA: Diagnosis not present

## 2015-02-02 ENCOUNTER — Ambulatory Visit (INDEPENDENT_AMBULATORY_CARE_PROVIDER_SITE_OTHER): Payer: 59 | Admitting: Neurology

## 2015-02-02 ENCOUNTER — Encounter: Payer: Self-pay | Admitting: Neurology

## 2015-02-02 VITALS — BP 130/80 | HR 62 | Ht 62.0 in | Wt 132.0 lb

## 2015-02-02 DIAGNOSIS — E785 Hyperlipidemia, unspecified: Secondary | ICD-10-CM

## 2015-02-02 DIAGNOSIS — G459 Transient cerebral ischemic attack, unspecified: Secondary | ICD-10-CM | POA: Diagnosis not present

## 2015-02-02 NOTE — Progress Notes (Signed)
NEUROLOGY FOLLOW UP OFFICE NOTE  Marilyn OVERHOLTZER [id number]  HISTORY OF PRESENT ILLNESS: Marilyn Cook is a 70 year old right-handed woman who follows up for possible TIA.   UPDATE: She is well.  No recurrent spells.  Hgb A1c 5.6% and LDL 94.  HISTORY: In May, she developed sudden onset numbness of her lips, which spread to include the left side of her face.  This subsequently spread to the palmar aspect of her left hand.  She then developed a stinging pain in her left thumb.  Symptoms lasted at least an hour but she still had residual symptoms involving the thumb afterwards.  The next day, she still noticed the stinging sensation in her thumb.  She saw her PCP.  An MRI of the brain from 06/21/14 showed premature cerebral and cerebellar atrophy and chronic small vessel disease, but was negative for acute or subacute stroke.  On 06/26/14, she developed a pressure-like sensation in her head.  She then developed numbness and tingling in the left leg from the knee down to the ankle.  It lasted only several minutes and resolved.  She was evaluated at the ED.  She had another episode, this time involving the left arm, lasting only a few minutes.  Labs were unremarkable except for mildly elevated glucose of 107.  She was subsequently sent home.  Since the initial event, she sometimes sees floaters, particularly if she closes her eyes.  One time, she noted visual disturbance in the left half of her visual field.  Another time, she reported seeing a scotoma moving around.  She denies history of migraine or stroke.  She reports allergy to ASA (she does not remember her symptoms).  Carotid doppler showed 1-39% bilateral ICA stenosis.  2D echo with bubble study showed LVEF of 55-60% with no PFO or atrial septal defect.  LDL was 82 and Hgb A1c was 5.9.  She saw Dr. Kathrin Penner of [facility], regarding seeing floaters, which the exam was overall unremarkable.  Fundi were normal.  Retinal exam did  not reveal emboli.      Due to aspirin allergy, she was instead started on Plavix.    PAST MEDICAL HISTORY: Past Medical History  Diagnosis Date  . Hyperlipemia   . NSVD (normal spontaneous vaginal delivery)     x 1  . Benign tumor of breast 1975  . Scalp psoriasis   . Osteopenia 08/2012    spine WNL, hip T -1.2  . TIA (transient ischemic attack) 06/08/2014  . Complication of anesthesia     hard to wake up  . Stroke [facility])     possible TIA in 06-2014    MEDICATIONS: Current Outpatient Prescriptions on File Prior to Visit  Medication Sig Dispense Refill  . Calcium Carb-Cholecalciferol (CALCIUM-VITAMIN D) 600-400 MG-UNIT TABS Take 1 tablet by mouth 2 (two) times daily.     . cetirizine (ZYRTEC) 10 MG tablet Take 10 mg by mouth daily.    . Cinnamon 500 MG capsule Take 1,000 mg by mouth 2 (two) times daily.     . clopidogrel (PLAVIX) 75 MG tablet Take 1 tablet (75 mg total) by mouth daily. 30 tablet 6  . co-enzyme Q-10 30 MG capsule Take 30 mg by mouth daily.    [name] CRANBERRY PO Take 1 tablet by mouth daily.     . cyanocobalamin 100 MCG tablet Take 100 mcg by mouth daily.      . hydrocortisone 2.5 % cream Apply 1 application topically 2 (  two) times daily as needed (itching).     . Misc Natural Products (BLACK CHERRY CONCENTRATE PO) Take 1 tablet by mouth daily.     . Potassium Chloride (K-TABS PO) Take 1 tablet by mouth daily.     . pravastatin (PRAVACHOL) 40 MG tablet TAKE 1 TABLET BY MOUTH ONCE DAILY 90 tablet 3  . Pyridoxine HCl (VITAMIN B-6 PO) Take 1 tablet by mouth daily.    [name] triamcinolone (KENALOG) 0.147 MG/GM topical spray Apply topically 2 (two) times daily as needed. For psoriasis 63 g 1   No current facility-administered medications on file prior to visit.    ALLERGIES: Allergies  Allergen Reactions  . Aspirin   . Diphenhydramine Hcl   . Eggs Or Egg-Derived Products Hives and Swelling  . Epinephrine     REACTION: UNSPECIFIED  . Latex     REACTION: swelling  . Other  Other (See Comments)    GI upset, headache to DAIRY PRODUCTS  . Penicillins   . Sulfasalazine     FAMILY HISTORY: Family History  Problem Relation Age of Onset  . Heart disease Father     MI   . Stroke Father     CVA  . Stroke Mother     CVA  . Hypertension Sister   . Cancer Sister     ovarian and colon cancer  . Aneurysm Brother   . Hypertension Brother   . Cancer Paternal Aunt     ? colon  . Cancer Paternal Grandfather     liver, alcohol  . Alcohol abuse Paternal Grandfather   . Hypertension Brother   . Cancer Other     prostate  . Cancer Maternal Aunt     breast    SOCIAL HISTORY: Social History   Social History  . Marital Status: Married    Spouse Name: N/A  . Number of Children: 1  . Years of Education: N/A   Occupational History  . [employer]    new hires   Social History Main Topics  . Smoking status: Never Smoker   . Smokeless tobacco: Never Used  . Alcohol Use: No  . Drug Use: No  . Sexual Activity: Yes   Other Topics Concern  . Not on file   Social History Narrative   Married and lives with husband   One son   Occupation: works at [employer] at [employer]   Activity: no regular exercise   Diet: some water, fruits/vegetables daily      Advanced directives: discussed - HCPOA unsure will consider this. Needs to set up.    REVIEW OF SYSTEMS: Constitutional: No fevers, chills, or sweats, no generalized fatigue, change in appetite Eyes: No visual changes, double vision, eye pain Ear, nose and throat: No hearing loss, ear pain, nasal congestion, sore throat Cardiovascular: No chest pain, palpitations Respiratory:  No shortness of breath at rest or with exertion, wheezes GastrointestinaI: No nausea, vomiting, diarrhea, abdominal pain, fecal incontinence Genitourinary:  No dysuria, urinary retention or frequency Musculoskeletal:  No neck pain, back pain Integumentary: No rash, pruritus, skin lesions Neurological: as above Psychiatric: No  depression, insomnia, anxiety Endocrine: No palpitations, fatigue, diaphoresis, mood swings, change in appetite, change in weight, increased thirst Hematologic/Lymphatic:  No anemia, purpura, petechiae. Allergic/Immunologic: no itchy/runny eyes, nasal congestion, recent allergic reactions, rashes  PHYSICAL EXAM: Filed Vitals:   02/02/15 1327  BP: 130/80  Pulse: 62   General: No acute distress.  Patient appears well-groomed.  normal body habitus. Head:  Normocephalic/atraumatic Eyes:  Fundoscopic exam unremarkable without vessel changes, exudates, hemorrhages or papilledema. Neck: supple, no paraspinal tenderness, full range of motion Heart:  Regular rate and rhythm Lungs:  Clear to auscultation bilaterally Back: No paraspinal tenderness Neurological Exam: alert and oriented to person, place, and time. Attention span and concentration intact, recent and remote memory intact, fund of knowledge intact.  Speech fluent and not dysarthric, language intact.  CN II-XII intact. Fundoscopic exam unremarkable without vessel changes, exudates, hemorrhages or papilledema.  Bulk and tone normal, muscle strength 5/5 throughout.  Sensation to light touch  intact.  Deep tendon reflexes 2+ throughout, toes downgoing.  Finger to nose and heel to shin testing intact.  Gait normal.  IMPRESSION: 1.  Recurrent left sided numbness, occuring in a span of a couple of one month in June 2.  Recurrent visual aura, probably migraine. Differential includes migraine versus TIA.  I would treat for secondary stroke prevention.  PLAN: Plavix Pravastatin (I would adjust dose for LDL goal of less than 70. This can be managed by PCP) Follow up as needed.  26 minutes spent face to face with patient, over 50% spent discussing diagnosis and management.  Metta Clines, DO  CC:  Ria Bush, MD

## 2015-02-05 ENCOUNTER — Other Ambulatory Visit: Payer: Self-pay | Admitting: Family Medicine

## 2015-02-05 MED FILL — CLOPIDOGREL 75 MG TABLET: 75 | 90 days supply | Qty: 90 | Fill #0

## 2015-03-19 ENCOUNTER — Ambulatory Visit: Payer: 59 | Admitting: Gastroenterology

## 2015-03-21 ENCOUNTER — Telehealth: Payer: Self-pay | Admitting: *Deleted

## 2015-03-21 ENCOUNTER — Encounter: Payer: Self-pay | Admitting: Gastroenterology

## 2015-03-21 ENCOUNTER — Ambulatory Visit (INDEPENDENT_AMBULATORY_CARE_PROVIDER_SITE_OTHER): Payer: 59 | Admitting: Gastroenterology

## 2015-03-21 VITALS — BP 110/70 | HR 72 | Ht 62.0 in | Wt 132.4 lb

## 2015-03-21 DIAGNOSIS — K5900 Constipation, unspecified: Secondary | ICD-10-CM | POA: Diagnosis not present

## 2015-03-21 DIAGNOSIS — R1013 Epigastric pain: Secondary | ICD-10-CM | POA: Diagnosis not present

## 2015-03-21 DIAGNOSIS — Z8673 Personal history of transient ischemic attack (TIA), and cerebral infarction without residual deficits: Secondary | ICD-10-CM | POA: Diagnosis not present

## 2015-03-21 MED ORDER — NA SULFATE-K SULFATE-MG SULF 17.5-3.13-1.6 GM/177ML PO SOLN: 1.0000 | Freq: Once | ORAL | Status: DC

## 2015-03-21 NOTE — Telephone Encounter (Signed)
  03/21/2015   RE: RACHAEL CARR DOB: [birth year] MRN: [id number]   Dear Dr Ria Bush,    We have scheduled the above patient for an endoscopic procedure. Our records show that she is on anticoagulation therapy.   Please advise as to how long the patient may come off her therapy of plavix prior to the procedure, which is scheduled for 04/17/2015.  Please fax back/ or route the completed form to [place] at [phone].   Sincerely,    Genella Mech

## 2015-03-21 NOTE — Progress Notes (Signed)
Marilyn Cook    [id number]    1945/12/07  Primary Care Physician:Javier Danise Mina, MD  Referring Physician: Ria Bush, MD [place], [place] [id number]  Chief complaint: Discuss surveillance colonoscopy  HPI: 70 year old female is here to discuss surveillance colonoscopy. Her last colonoscopy was in 2009, 1 diminutive hyperplastic polyp was removed from the rectosigmoid region. Last summer she was having headache and numbness of her left arm and was started on Plavix for secondary stroke prevention for possible TIA causing the arm numbness. She reports having constipation for the past few months which is a recent change in her bowel habits. She has a bowel movement every other day or every 2-3 days, is not taking any laxatives currently. Denies any diarrhea or dark stool or blood in stool. On review of system also complained of pain in epigastric region with sometimes her first straight down to the periumbilical region associated with some flutter sensation. No association with diet. She's been having this for the past 6-8 months.    Outpatient Encounter Prescriptions as of 03/21/2015  Medication Sig  . Calcium Carb-Cholecalciferol (CALCIUM-VITAMIN D) 600-400 MG-UNIT TABS Take 1 tablet by mouth 2 (two) times daily.   . cetirizine (ZYRTEC) 10 MG tablet Take 10 mg by mouth daily.  . Cinnamon 500 MG capsule Take 1,000 mg by mouth 2 (two) times daily.   . clopidogrel (PLAVIX) 75 MG tablet TAKE 1 TABLET BY MOUTH DAILY.  [name] co-enzyme Q-10 30 MG capsule Take 30 mg by mouth daily.  [name] CRANBERRY PO Take 1 tablet by mouth daily.   . cyanocobalamin 100 MCG tablet Take 100 mcg by mouth daily.    . hydrocortisone 2.5 % cream Apply 1 application topically 2 (two) times daily as needed (itching).   . Misc Natural Products (BLACK CHERRY CONCENTRATE PO) Take 1 tablet by mouth daily.   . Potassium Chloride (K-TABS PO) Take 1 tablet by mouth daily.   . pravastatin  (PRAVACHOL) 40 MG tablet TAKE 1 TABLET BY MOUTH ONCE DAILY  . Pyridoxine HCl (VITAMIN B-6 PO) Take 1 tablet by mouth daily.  [name] triamcinolone (KENALOG) 0.147 MG/GM topical spray Apply topically 2 (two) times daily as needed. For psoriasis   No facility-administered encounter medications on file as of 03/21/2015.    Allergies as of 03/21/2015 - Review Complete 02/02/2015  Allergen Reaction Noted  . Aspirin    . Diphenhydramine hcl    . Eggs or egg-derived products Hives and Swelling 09/20/2007  . Epinephrine  07/07/2006  . Latex  10/12/2007  . Other Other (See Comments) 08/04/2013  . Penicillins    . Sulfasalazine      Past Medical History  Diagnosis Date  . Hyperlipemia   . NSVD (normal spontaneous vaginal delivery)     x 1  . Benign tumor of breast 1975  . Scalp psoriasis   . Osteopenia 08/2012    spine WNL, hip T -1.2  . TIA (transient ischemic attack) 06/08/2014  . Complication of anesthesia     hard to wake up  . Stroke [facility])     possible TIA in 06-2014    Past Surgical History  Procedure Laterality Date  . Partial hysterectomy  1987    due to fibroids, ovaries remain  . R lat epicondyle injections  1998  . Spiral ct  04/02    (-)  . Dexa  08/2012    spine WNL, hip T -1.2  .  Abdominal hysterectomy    . Shoulder arthroscopy Right 11/2014    arthroscopic debridement and mini open rotator cuff repair of R shoulder for chronic impingement and rotator cuff tear by MRI [facility])    Family History  Problem Relation Age of Onset  . Heart disease Father     MI   . Stroke Father     CVA  . Stroke Mother     CVA  . Hypertension Sister   . Cancer Sister     ovarian and colon cancer  . Aneurysm Brother   . Hypertension Brother   . Cancer Paternal Aunt     ? colon  . Cancer Paternal Grandfather     liver, alcohol  . Alcohol abuse Paternal Grandfather   . Hypertension Brother   . Cancer Other     prostate  . Cancer Maternal Aunt     breast    Social History    Social History  . Marital Status: Married    Spouse Name: N/A  . Number of Children: 1  . Years of Education: N/A   Occupational History  . [employer] [place]    new hires   Social History Main Topics  . Smoking status: Never Smoker   . Smokeless tobacco: Never Used  . Alcohol Use: No  . Drug Use: No  . Sexual Activity: Yes   Other Topics Concern  . Not on file   Social History Narrative   Married and lives with husband   One son   Occupation: works at [employer] at [employer]   Activity: no regular exercise   Diet: some water, fruits/vegetables daily      Advanced directives: discussed - HCPOA unsure will consider this. Needs to set up.      Review of systems: Review of Systems  Constitutional: Negative for fever and chills.  HENT: Negative.   Eyes: Negative for blurred vision.  Respiratory: Negative for cough, shortness of breath and wheezing.   Cardiovascular: Negative for chest pain and palpitations.  Gastrointestinal: as per HPI Genitourinary: Negative for dysuria, urgency, frequency and hematuria.  Musculoskeletal: Negative for myalgias, back pain and joint pain.  Skin: Negative for itching and rash.  Neurological: Negative for dizziness, tremors, focal weakness, seizures and loss of consciousness.  Endo/Heme/Allergies: Negative for environmental allergies.  Psychiatric/Behavioral: Negative for depression, suicidal ideas and hallucinations.  All other systems reviewed and are negative.   Physical Exam: There were no vitals filed for this visit. Gen:      No acute distress HEENT:  EOMI, sclera anicteric Neck:     No masses; no thyromegaly Lungs:    Clear to auscultation bilaterally; normal respiratory effort CV:         Regular rate and rhythm; no murmurs Abd:      + bowel sounds; soft, non-tender; no palpable masses, no distension Ext:    No edema; adequate peripheral perfusion Skin:      Warm and dry; no rash Neuro: alert and oriented x 3 Psych: normal mood  and affect  Data Reviewed: ECHO 06/2014 Left ventricle: The cavity size was normal. Systolic function was  normal. The estimated ejection fraction was in the range of 55%  to 60%. Wall motion was normal; there were no regional wall  motion abnormalities. - Aortic valve: Trileaflet; normal thickness, mildly calcified  leaflets. - Mitral valve: There was trivial regurgitation. - Atrial septum: No defect or patent foramen ovale was identified. - Tricuspid valve: There was trivial regurgitation. -  Pulmonic valve: There was trivial regurgitation.   Assessment and Plan/Recommendations:  70 year old female here  with recent change in bowel habits and epigastric abdominal pain Given her history of arm numbness and abdominal pain, we'll obtain abdominal duplex study to evaluate for possible aortic aneurysm We'll schedule for colonoscopy to evaluate for change in bowel habits Start MiraLAX half capful daily and titrate as needed to have 1-2 soft bowel movements Return in 6 months  K. Denzil Magnuson , MD [phone] Mon-Fri 8a-5p [phone] after 5p, weekends, holidays

## 2015-03-21 NOTE — Patient Instructions (Addendum)
Your have been scheduled for an abdominal ultrasound at [facility] Radiology 1st Floor  On Tuesday March 21st at 2 pm please do not eat or drink 8 hours before your test arrive at 1:45am  You have been scheduled for a colonoscopy. Please follow written instructions given to you at your visit today.  Please pick up your prep supplies at the pharmacy within the next 1-3 days. If you use inhalers (even only as needed), please bring them with you on the day of your procedure. Your physician has requested that you go to www.startemmi.com and enter the access code given to you at your visit today. This web site gives a general overview about your procedure. However, you should still follow specific instructions given to you by our office regarding your preparation for the procedure.  You will be contacted by our office prior to your procedure for directions on holding your Plavix.  If you do not hear from our office 1 week prior to your scheduled procedure, please call [phone] to discuss.   Use Miralax daily 1/2 capful

## 2015-03-26 MED FILL — PRAVASTATIN NA 40 MG TAB: 40 | 90 days supply | Qty: 90 | Fill #1

## 2015-03-27 ENCOUNTER — Ambulatory Visit (HOSPITAL_COMMUNITY): Payer: 59

## 2015-03-27 NOTE — Telephone Encounter (Signed)
Indication for plavix is possible TIA (vs migraine).  Seems like she's scheduled for low risk procedure - colonoscopy. Would see if Dr Silverio Decamp would be willing to proceed without stopping plavix.  If not, ok to hold plavix x 5 days prior to procedure. Restart night of procedure

## 2015-03-27 NOTE — Telephone Encounter (Signed)
See Dr. Bosie Clos note. Do you wish to continue off Plavix?

## 2015-03-28 DIAGNOSIS — H43813 Vitreous degeneration, bilateral: Secondary | ICD-10-CM | POA: Diagnosis not present

## 2015-03-28 DIAGNOSIS — H5315 Visual distortions of shape and size: Secondary | ICD-10-CM | POA: Diagnosis not present

## 2015-03-28 DIAGNOSIS — H04123 Dry eye syndrome of bilateral lacrimal glands: Secondary | ICD-10-CM | POA: Diagnosis not present

## 2015-03-28 DIAGNOSIS — H40013 Open angle with borderline findings, low risk, bilateral: Secondary | ICD-10-CM | POA: Diagnosis not present

## 2015-03-28 DIAGNOSIS — H25812 Combined forms of age-related cataract, left eye: Secondary | ICD-10-CM | POA: Diagnosis not present

## 2015-03-28 DIAGNOSIS — H2511 Age-related nuclear cataract, right eye: Secondary | ICD-10-CM | POA: Diagnosis not present

## 2015-03-28 DIAGNOSIS — H16143 Punctate keratitis, bilateral: Secondary | ICD-10-CM | POA: Diagnosis not present

## 2015-03-28 DIAGNOSIS — G43909 Migraine, unspecified, not intractable, without status migrainosus: Secondary | ICD-10-CM | POA: Diagnosis not present

## 2015-03-29 ENCOUNTER — Ambulatory Visit (HOSPITAL_COMMUNITY)
Admission: RE | Admit: 2015-03-29 | Discharge: 2015-03-29 | Disposition: A | Discharge status: Home / Self Care | Payer: 59 | Source: Ambulatory Visit | Attending: Gastroenterology | Admitting: Gastroenterology

## 2015-03-29 ENCOUNTER — Telehealth: Payer: Self-pay | Admitting: Gastroenterology

## 2015-03-29 DIAGNOSIS — I77811 Abdominal aortic ectasia: Secondary | ICD-10-CM | POA: Diagnosis not present

## 2015-03-29 DIAGNOSIS — R1013 Epigastric pain: Secondary | ICD-10-CM | POA: Insufficient documentation

## 2015-03-29 DIAGNOSIS — Z8673 Personal history of transient ischemic attack (TIA), and cerebral infarction without residual deficits: Secondary | ICD-10-CM | POA: Diagnosis not present

## 2015-03-29 NOTE — Telephone Encounter (Signed)
Dr Nandigam Please advise  

## 2015-03-29 NOTE — Telephone Encounter (Signed)
Will see if we have any sample kits

## 2015-03-30 NOTE — Telephone Encounter (Signed)
L/m for patient to come pick up sample Suprep kit

## 2015-04-02 NOTE — Telephone Encounter (Signed)
Ok we can proceed with the procedure on Plavix, given she is considered to be a high risk to come off plavix

## 2015-04-02 NOTE — Telephone Encounter (Signed)
ok 

## 2015-04-04 ENCOUNTER — Encounter: Payer: Self-pay | Admitting: Family Medicine

## 2015-04-04 DIAGNOSIS — Z1231 Encounter for screening mammogram for malignant neoplasm of breast: Secondary | ICD-10-CM | POA: Diagnosis not present

## 2015-04-04 LAB — HM MAMMOGRAPHY

## 2015-04-04 NOTE — Telephone Encounter (Signed)
Patient aware to stay on plavix

## 2015-04-04 NOTE — Telephone Encounter (Signed)
Stay on Plavix

## 2015-04-05 ENCOUNTER — Encounter: Payer: Self-pay | Admitting: *Deleted

## 2015-04-07 HISTORY — PX: COLONOSCOPY: SHX174

## 2015-04-09 ENCOUNTER — Encounter: Payer: Self-pay | Admitting: *Deleted

## 2015-04-10 ENCOUNTER — Telehealth: Payer: Self-pay

## 2015-04-10 NOTE — Telephone Encounter (Signed)
Pt left v/m; pt scheduled for colonoscopy next week and pt takes plavix. Pt was told by GI office that Dr Darnell Level did not want pt to stop Plavix and pt understands if needs biopsy of a polyp while doing colonoscopy pt will not be able to have biopsy due to continuing plavix. Pt wants to know why pt cannot stop plavix for colonoscopy. Pt said previously had shoulder surgery and plavix was stopped. Pt request cb.

## 2015-04-10 NOTE — Telephone Encounter (Signed)
I thought colonoscopy was considered low risk procedure, but if needs to stop plavix to have possible polyp removed, ok to hold plavix x 5 days prior to procedure. Restart night of procedure.  Will cc Dr Silverio Decamp and Shirlean Mylar as well as Maudie Mercury to notify patient.

## 2015-04-11 ENCOUNTER — Telehealth: Payer: Self-pay | Admitting: Family Medicine

## 2015-04-11 NOTE — Telephone Encounter (Signed)
Pt called wanting to talk to [place] or dr g about her colonoscopy and blood thinner Please call pt

## 2015-04-11 NOTE — Telephone Encounter (Signed)
PT CAN HOLD PLAVIX NOW 5 DAYS BEFORE L/M FOR PT

## 2015-04-11 NOTE — Telephone Encounter (Signed)
I agree with Dr Danise Mina, She is likely going to be a low risk colonoscopy based on prior colonoscopy findings. Ok to proceed with colonoscopy on Plavix but given patient is concerned about possible bleeding post polypectomy, can hold for 5 days prior to the procedure

## 2015-04-11 NOTE — Telephone Encounter (Signed)
Spoke with patient.

## 2015-04-11 NOTE — Telephone Encounter (Signed)
Patient notified and verbalized understanding. 

## 2015-04-17 ENCOUNTER — Encounter: Payer: Self-pay | Admitting: Gastroenterology

## 2015-04-17 ENCOUNTER — Ambulatory Visit (AMBULATORY_SURGERY_CENTER): Payer: 59 | Admitting: Gastroenterology

## 2015-04-17 VITALS — BP 127/67 | HR 70 | Temp 98.0°F | Resp 19 | Ht 62.0 in | Wt 132.0 lb

## 2015-04-17 DIAGNOSIS — K5900 Constipation, unspecified: Secondary | ICD-10-CM | POA: Diagnosis not present

## 2015-04-17 DIAGNOSIS — Z86010 Personal history of colonic polyps: Secondary | ICD-10-CM

## 2015-04-17 DIAGNOSIS — Z8673 Personal history of transient ischemic attack (TIA), and cerebral infarction without residual deficits: Secondary | ICD-10-CM | POA: Diagnosis not present

## 2015-04-17 MED ORDER — SODIUM CHLORIDE 0.9 % IV SOLN: 500.0000 mL | INTRAVENOUS | Status: DC

## 2015-04-17 NOTE — Progress Notes (Signed)
Patient awakening,vss,report to rn 

## 2015-04-17 NOTE — Patient Instructions (Signed)
YOU HAD AN ENDOSCOPIC PROCEDURE TODAY AT THE [facility]:   Refer to the procedure report that was given to you for any specific questions about what was found during the examination.  If the procedure report does not answer your questions, please call your gastroenterologist to clarify.  If you requested that your care partner not be given the details of your procedure findings, then the procedure report has been included in a sealed envelope for you to review at your convenience later.  YOU SHOULD EXPECT: Some feelings of bloating in the abdomen. Passage of more gas than usual.  Walking can help get rid of the air that was put into your GI tract during the procedure and reduce the bloating. If you had a lower endoscopy (such as a colonoscopy or flexible sigmoidoscopy) you may notice spotting of blood in your stool or on the toilet paper. If you underwent a bowel prep for your procedure, you may not have a normal bowel movement for a few days.  Please Note:  You might notice some irritation and congestion in your nose or some drainage.  This is from the oxygen used during your procedure.  There is no need for concern and it should clear up in a day or so.  SYMPTOMS TO REPORT IMMEDIATELY:   Following lower endoscopy (colonoscopy or flexible sigmoidoscopy):  Excessive amounts of blood in the stool  Significant tenderness or worsening of abdominal pains  Swelling of the abdomen that is new, acute  Fever of 100F or higher    For urgent or emergent issues, a gastroenterologist can be reached at any hour by calling [phone].   DIET: Your first meal following the procedure should be a small meal and then it is ok to progress to your normal diet. Heavy or fried foods are harder to digest and may make you feel nauseous or bloated.  Likewise, meals heavy in dairy and vegetables can increase bloating.  Drink plenty of fluids but you should avoid alcoholic beverages for 24  hours.  ACTIVITY:  You should plan to take it easy for the rest of today and you should NOT DRIVE or use heavy machinery until tomorrow (because of the sedation medicines used during the test).    FOLLOW UP: Our staff will call the number listed on your records the next business day following your procedure to check on you and address any questions or concerns that you may have regarding the information given to you following your procedure. If we do not reach you, we will leave a message.  However, if you are feeling well and you are not experiencing any problems, there is no need to return our call.  We will assume that you have returned to your regular daily activities without incident.  If any biopsies were taken you will be contacted by phone or by letter within the next 1-3 weeks.  Please call us at [phone] if you have not heard about the biopsies in 3 weeks.    SIGNATURES/CONFIDENTIALITY: You and/or your care partner have signed paperwork which will be entered into your electronic medical record.  These signatures attest to the fact that that the information above on your After Visit Summary has been reviewed and is understood.  Full responsibility of the confidentiality of this discharge information lies with you and/or your care-partner.   Resume medications. Information given on diverticulosis,hemorrhoids and high fiber diet. 

## 2015-04-17 NOTE — Op Note (Signed)
[place] Patient Name: Marilyn Cook Procedure Date: 04/17/2015 3:22 PM MRN: [id number] Endoscopist: Mauri Pole , MD Age: 70 Date of Birth: Aug 07, 1945 Gender: Female Procedure:                Colonoscopy Indications:              High risk colon cancer surveillance: Personal                            history of colonic polyps Medicines:                Monitored Anesthesia Care Procedure:                Pre-Anesthesia Assessment:                           - Prior to the procedure, a History and Physical                            was performed, and patient medications and                            allergies were reviewed. The patient's tolerance of                            previous anesthesia was also reviewed. The risks                            and benefits of the procedure and the sedation                            options and risks were discussed with the patient.                            All questions were answered, and informed consent                            was obtained. Prior Anticoagulants: The patient                            last took Plavix (clopidogrel) 5 days prior to the                            procedure. ASA Grade Assessment: III - A patient                            with severe systemic disease. After reviewing the                            risks and benefits, the patient was deemed in                            satisfactory condition to undergo the procedure.  After obtaining informed consent, the colonoscope                            was passed under direct vision. Throughout the                            procedure, the patient's blood pressure, pulse, and                            oxygen saturations were monitored continuously. The                            Model CF-HQ190L [id number]) scope was introduced                            through the anus and advanced to the the cecum,           identified by appendiceal orifice and ileocecal                            valve. The colonoscopy was performed without                            difficulty. The patient tolerated the procedure                            well. The quality of the bowel preparation was                            good. The ileocecal valve, appendiceal orifice, and                            rectum were photographed. Scope In: 3:33:24 PM Scope Out: [id number] PM Scope Withdrawal Time: 0 hours 7 minutes 2 seconds  Total Procedure Duration: 0 hours 13 minutes 22 seconds  Findings:                 The perianal and digital rectal examinations were                            normal.                           Multiple small and large-mouthed diverticula were                            found in the sigmoid colon, descending colon and                            ascending colon.                           Non-bleeding internal hemorrhoids were found during                            retroflexion. The hemorrhoids were medium-sized. Complications:  No immediate complications. Estimated Blood Loss:     Estimated blood loss: none. Impression:               - Diverticulosis in the sigmoid colon, in the                            descending colon and in the ascending colon.                           - Non-bleeding internal hemorrhoids.                           - No specimens collected. Recommendation:           - Repeat colonoscopy in 10 years for screening                            purposes.                           - Return to GI clinic PRN.                           - Patient has a contact number available for                            emergencies. The signs and symptoms of potential                            delayed complications were discussed with the                            patient. Return to normal activities tomorrow.                            Written discharge instructions were provided  to the                            patient.                           - Resume previous diet.                           - Continue present medications.                           - Resume Plavix (clopidogrel) at prior dose today. Mauri Pole, MD 04/17/2015 3:56:30 PM This report has been signed electronically.

## 2015-04-18 ENCOUNTER — Telehealth: Payer: Self-pay

## 2015-04-18 ENCOUNTER — Encounter: Payer: Self-pay | Admitting: Family Medicine

## 2015-04-18 NOTE — Telephone Encounter (Signed)
  Follow up Call-  Call back number 04/17/2015  Post procedure Call Back phone  # [phone]  Permission to leave phone message Yes    Patient was called for follow up after her procedure on 04/17/2015. No answer at the number given for follow up phone call. I attempted to leave a message however the answering machine kept beeping so I was unable to leave a message.

## 2015-04-24 ENCOUNTER — Encounter: Payer: Self-pay | Admitting: Family Medicine

## 2015-05-01 MED FILL — TRIAMCINOLONE 0.147 MG/G SP: 0.147 | 30 days supply | Qty: 63 | Fill #1

## 2015-05-01 MED FILL — CLOPIDOGREL 75 MG TABLET: 75 | 90 days supply | Qty: 90 | Fill #1

## 2015-07-05 MED FILL — PRAVASTATIN NA 40 MG TAB: 40 | 90 days supply | Qty: 90 | Fill #2

## 2015-07-23 ENCOUNTER — Ambulatory Visit: Payer: 59 | Admitting: Family Medicine

## 2015-07-24 ENCOUNTER — Ambulatory Visit (INDEPENDENT_AMBULATORY_CARE_PROVIDER_SITE_OTHER): Payer: 59 | Admitting: Family Medicine

## 2015-07-24 ENCOUNTER — Encounter: Payer: Self-pay | Admitting: Family Medicine

## 2015-07-24 VITALS — BP 132/78 | HR 80 | Temp 98.1°F | Wt 130.5 lb

## 2015-07-24 DIAGNOSIS — G459 Transient cerebral ischemic attack, unspecified: Secondary | ICD-10-CM | POA: Diagnosis not present

## 2015-07-24 DIAGNOSIS — E785 Hyperlipidemia, unspecified: Secondary | ICD-10-CM | POA: Diagnosis not present

## 2015-07-24 MED ORDER — CLOPIDOGREL BISULFATE 75 MG PO TABS: 75.0000 mg | ORAL_TABLET | Freq: Every day | ORAL | Status: DC

## 2015-07-24 MED ORDER — TRIAMCINOLONE ACETONIDE 0.147 MG/GM EX AERS: INHALATION_SPRAY | Freq: Two times a day (BID) | CUTANEOUS | Status: DC | PRN

## 2015-07-24 MED FILL — CLOPIDOGREL 75 MG TABLET: 75 | 90 days supply | Qty: 90 | Fill #0

## 2015-07-24 MED FILL — TRIAMCINOLONE 0.147 MG/G SP: 0.147 | 30 days supply | Qty: 63 | Fill #0

## 2015-07-24 NOTE — Progress Notes (Signed)
BP 132/78 mmHg  Pulse 80  Temp(Src) 98.1 F (36.7 C) (Oral)  Wt 130 lb 8 oz (59.194 kg)   CC: 6 mo f/u visit  Subjective:    Patient ID: Marilyn Cook, female    DOB: [birth year], 70 y.o.   MRN: [id number]  HPI: Marilyn Cook is a 70 y.o. female presenting on 07/24/2015 for Follow-up   Since last seen here, saw neurology and GI for colonoscopy. Records reviewed.   HLD - compliant and tolerating pravastatin 40mg  daily. Goal LDL<70.  Lab Results  Component Value Date   LDLCALC 94 01/10/2015     ?TIA - on plavix for last 1 year. Aspirin allergy. Appreciate neurology care.   Relevant past medical, surgical, family and social history reviewed and updated as indicated. Interim medical history since our last visit reviewed. Allergies and medications reviewed and updated. Current Outpatient Prescriptions on File Prior to Visit  Medication Sig  . Ascorbic Acid (VITAMIN C PO) Take 1 capsule by mouth daily.  . Calcium Carb-Cholecalciferol (CALCIUM-VITAMIN D) 600-400 MG-UNIT TABS Take 1 tablet by mouth 2 (two) times daily.   . cetirizine (ZYRTEC) 10 MG tablet Take 10 mg by mouth daily.  . Cinnamon 500 MG capsule Take 1,000 mg by mouth 2 (two) times daily.   . clopidogrel (PLAVIX) 75 MG tablet TAKE 1 TABLET BY MOUTH DAILY.  [name] co-enzyme Q-10 30 MG capsule Take 30 mg by mouth daily.  [name] CRANBERRY PO Take 1 tablet by mouth daily.   . hydrocortisone 2.5 % cream Apply 1 application topically 2 (two) times daily as needed (itching).   . Misc Natural Products (BLACK CHERRY CONCENTRATE PO) Take 1 tablet by mouth daily.   . Potassium Chloride (K-TABS PO) Take 1 tablet by mouth daily.   . pravastatin (PRAVACHOL) 40 MG tablet TAKE 1 TABLET BY MOUTH ONCE DAILY  . Pyridoxine HCl (VITAMIN B-6 PO) Take 1 tablet by mouth daily.  [name] triamcinolone (KENALOG) 0.147 MG/GM topical spray Apply topically 2 (two) times daily as needed. For psoriasis   No current facility-administered medications on file prior  to visit.    Review of Systems Per HPI unless specifically indicated in ROS section     Objective:    BP 132/78 mmHg  Pulse 80  Temp(Src) 98.1 F (36.7 C) (Oral)  Wt 130 lb 8 oz (59.194 kg)  Wt Readings from Last 3 Encounters:  07/24/15 130 lb 8 oz (59.194 kg)  04/17/15 132 lb (59.875 kg)  03/21/15 132 lb 6.4 oz (60.056 kg)    Physical Exam  Constitutional: She appears well-developed and well-nourished. No distress.  HENT:  Mouth/Throat: Oropharynx is clear and moist. No oropharyngeal exudate.  Cardiovascular: Normal rate, regular rhythm, normal heart sounds and intact distal pulses.   No murmur heard. Pulmonary/Chest: Effort normal and breath sounds normal. No respiratory distress. She has no wheezes. She has no rales.  Psychiatric: She has a normal mood and affect.  Nursing note and vitals reviewed.  Results for orders placed or performed in visit on 04/09/15  HM MAMMOGRAPHY  Result Value Ref Range   HM Mammogram 0-4 Bi-Rad 0-4 Bi-Rad, Self Reported Normal      Assessment & Plan:   Problem List Items Addressed This Visit    HLD (hyperlipidemia) - Primary    Chronic, stable. Continue pravastatin.       TIA (transient ischemic attack)    Continues plavix, doesn't like side effects. Continue this.  Follow up plan: Return in about 6 months (around 01/24/2016), or as needed, for annual exam, prior fasting for blood work.  Ria Bush, MD

## 2015-07-24 NOTE — Assessment & Plan Note (Signed)
Continues plavix, doesn't like side effects. Continue this.

## 2015-07-24 NOTE — Progress Notes (Signed)
Pre visit review using our clinic review tool, if applicable. No additional management support is needed unless otherwise documented below in the visit note. 

## 2015-07-24 NOTE — Assessment & Plan Note (Signed)
Chronic, stable. Continue pravastatin. 

## 2015-07-24 NOTE — Addendum Note (Signed)
Addended by: Ria Bush on: 07/24/2015 02:27 PM   Modules accepted: Orders, Medications

## 2015-07-24 NOTE — Patient Instructions (Signed)
You are doing well. Continue plavix and pravastatin.  Return in 6 months for physical.

## 2015-07-26 ENCOUNTER — Telehealth: Payer: Self-pay | Admitting: Family Medicine

## 2015-07-26 NOTE — Telephone Encounter (Signed)
LM for pt to sch AWV and CPE, mn °

## 2015-10-04 MED FILL — PRAVASTATIN NA 40 MG TAB: 40 | 90 days supply | Qty: 90 | Fill #3

## 2015-11-05 MED FILL — CLOPIDOGREL 75 MG TABLET: 75 | 90 days supply | Qty: 90 | Fill #1

## 2015-11-22 ENCOUNTER — Telehealth: Payer: Self-pay | Admitting: Family Medicine

## 2015-11-22 NOTE — Telephone Encounter (Signed)
Patient Name: Marilyn Cook  DOB: [birth year]    Initial Comment Caller says, Lt eye, watering a lot, 4 hrs a day. Now her ear has a clicking noise which seems to get faster.    Nurse Assessment  Nurse: Raphael Gibney, RN, Vanita Ingles Date/Time Eilene Ghazi Time): 11/22/2015 12:54:47 PM  Confirm and document reason for call. If symptomatic, describe symptoms. You must click the next button to save text entered. ---Caller states her left eye has had a lot of watering. Tears run down her face. Now she has clicking noise in her left ear. No cough or nasal congestion.  Has the patient traveled out of the country within the last 30 days? ---Not Applicable  Does the patient have any new or worsening symptoms? ---Yes  Will a triage be completed? ---Yes  Related visit to physician within the last 2 weeks? ---No  Does the PT have any chronic conditions? (i.e. diabetes, asthma, etc.) ---No  Is this a behavioral health or substance abuse call? ---No     Guidelines    Guideline Title Affirmed Question Affirmed Notes  Tinnitus Symptoms only or mainly in 1 ear (unilateral tinnitus)    Final Disposition User   See PCP When Office is Open (within 3 days) Raphael Gibney, RN, Vanita Ingles    Comments  appt scheduled for 11/23/2015 at 10:30 am with Dr. Ria Bush   Disagree/Comply: Comply

## 2015-11-23 ENCOUNTER — Ambulatory Visit (INDEPENDENT_AMBULATORY_CARE_PROVIDER_SITE_OTHER): Payer: 59 | Admitting: Family Medicine

## 2015-11-23 ENCOUNTER — Encounter: Payer: Self-pay | Admitting: Family Medicine

## 2015-11-23 VITALS — BP 126/78 | HR 80 | Temp 97.8°F | Wt 134.8 lb

## 2015-11-23 DIAGNOSIS — Z23 Encounter for immunization: Secondary | ICD-10-CM

## 2015-11-23 DIAGNOSIS — H938X2 Other specified disorders of left ear: Secondary | ICD-10-CM | POA: Diagnosis not present

## 2015-11-23 DIAGNOSIS — Z2911 Encounter for prophylactic immunotherapy for respiratory syncytial virus (RSV): Secondary | ICD-10-CM | POA: Diagnosis not present

## 2015-11-23 NOTE — Addendum Note (Signed)
Addended by: Royann Shivers A on: 11/23/2015 11:32 AM   Modules accepted: Orders

## 2015-11-23 NOTE — Assessment & Plan Note (Signed)
Exam WNL - possible sliver of wax causing clicking - rec mineral oil treatment. Check hearing screen at next AMW. Pt agrees with plan.

## 2015-11-23 NOTE — Progress Notes (Signed)
BP 126/78   Pulse 80   Temp 97.8 F (36.6 C) (Oral)   Wt 134 lb 12 oz (61.1 kg)   BMI 24.65 kg/m    CC: check L ear Subjective:    Patient ID: Marilyn Cook, female    DOB: Sep 17, 1945, 70 y.o.   MRN: [id number]  HPI: Marilyn Cook is a 70 y.o. female presenting on 11/23/2015 for Ear Problem (clicking in left ear over 1 week)   1 wk h/o click in left ear. No ear pain, congestion or fevers/chills. No hearing changes.   Also notices L eye watering over several months. Seen by eye doctor for this.   Relevant past medical, surgical, family and social history reviewed and updated as indicated. Interim medical history since our last visit reviewed. Allergies and medications reviewed and updated. Current Outpatient Prescriptions on File Prior to Visit  Medication Sig  . Ascorbic Acid (VITAMIN C PO) Take 1 capsule by mouth daily.  . Calcium Carb-Cholecalciferol (CALCIUM-VITAMIN D) 600-400 MG-UNIT TABS Take 1 tablet by mouth 2 (two) times daily.   . cetirizine (ZYRTEC) 10 MG tablet Take 10 mg by mouth daily.  . Cinnamon 500 MG capsule Take 1,000 mg by mouth 2 (two) times daily.   . clopidogrel (PLAVIX) 75 MG tablet Take 1 tablet (75 mg total) by mouth daily.  [name] co-enzyme Q-10 30 MG capsule Take 30 mg by mouth daily.  [name] CRANBERRY PO Take 1 tablet by mouth daily.   . hydrocortisone 2.5 % cream Apply 1 application topically 2 (two) times daily as needed (itching).   . Misc Natural Products (BLACK CHERRY CONCENTRATE PO) Take 1 tablet by mouth daily.   . Potassium Chloride (K-TABS PO) Take 1 tablet by mouth daily.   . pravastatin (PRAVACHOL) 40 MG tablet TAKE 1 TABLET BY MOUTH ONCE DAILY  . Pyridoxine HCl (VITAMIN B-6 PO) Take 1 tablet by mouth daily.  [name] triamcinolone (KENALOG) 0.147 MG/GM topical spray Apply topically 2 (two) times daily as needed. For psoriasis  . vitamin B-12 (CYANOCOBALAMIN) 1000 MCG tablet Take 1,000 mcg by mouth daily.   No current facility-administered medications  on file prior to visit.     Review of Systems Per HPI unless specifically indicated in ROS section     Objective:    BP 126/78   Pulse 80   Temp 97.8 F (36.6 C) (Oral)   Wt 134 lb 12 oz (61.1 kg)   BMI 24.65 kg/m   Wt Readings from Last 3 Encounters:  11/23/15 134 lb 12 oz (61.1 kg)  07/24/15 130 lb 8 oz (59.2 kg)  04/17/15 132 lb (59.9 kg)    Physical Exam  Constitutional: She appears well-developed and well-nourished. No distress.  HENT:  Head: Normocephalic and atraumatic.  Right Ear: Hearing, tympanic membrane, external ear and ear canal normal.  Left Ear: Hearing, tympanic membrane, external ear and ear canal normal.  Mouth/Throat: Oropharynx is clear and moist. No oropharyngeal exudate.  Sliver of wax deep in left canal near TM otherwise normal exam. Pearly grey TMs.  Lymphadenopathy:    She has no cervical adenopathy.  Nursing note and vitals reviewed.  Results for orders placed or performed in visit on 04/09/15  HM MAMMOGRAPHY  Result Value Ref Range   HM Mammogram 0-4 Bi-Rad 0-4 Bi-Rad, Self Reported Normal      Assessment & Plan:  Pt states shingles shot covered by insurance at office - provided today.  Problem List Items Addressed This Visit  Crackling sound in left ear - Primary    Exam WNL - possible sliver of wax causing clicking - rec mineral oil treatment. Check hearing screen at next AMW. Pt agrees with plan.          Follow up plan: Return if symptoms worsen or fail to improve.  Marilyn Bush, MD

## 2015-11-23 NOTE — Progress Notes (Signed)
Pre visit review using our clinic review tool, if applicable. No additional management support is needed unless otherwise documented below in the visit note. 

## 2015-11-23 NOTE — Patient Instructions (Addendum)
Shingles shot today Ears looking ok - try mineral oil to left era. Ok to take trip to [place].

## 2016-01-08 ENCOUNTER — Other Ambulatory Visit: Payer: Self-pay | Admitting: Family Medicine

## 2016-01-08 MED FILL — PRAVASTATIN NA 40 MG TAB: 40 | 90 days supply | Qty: 90 | Fill #0

## 2016-01-24 ENCOUNTER — Other Ambulatory Visit: Payer: Self-pay | Admitting: Family Medicine

## 2016-01-24 DIAGNOSIS — Z1159 Encounter for screening for other viral diseases: Secondary | ICD-10-CM

## 2016-01-24 DIAGNOSIS — E785 Hyperlipidemia, unspecified: Secondary | ICD-10-CM

## 2016-01-25 ENCOUNTER — Other Ambulatory Visit: Payer: 59

## 2016-01-28 ENCOUNTER — Encounter: Payer: Self-pay | Admitting: Gastroenterology

## 2016-01-28 ENCOUNTER — Telehealth: Payer: Self-pay | Admitting: Family Medicine

## 2016-01-28 NOTE — Telephone Encounter (Signed)
Pt came in today having questions about her 11/22/16 bill. She stated she was being charged for 2 vaccines and she only remembers getting one. Can you help her with this  Best number [phone]

## 2016-01-30 ENCOUNTER — Other Ambulatory Visit (INDEPENDENT_AMBULATORY_CARE_PROVIDER_SITE_OTHER): Payer: 59

## 2016-01-30 DIAGNOSIS — Z1159 Encounter for screening for other viral diseases: Secondary | ICD-10-CM | POA: Diagnosis not present

## 2016-01-30 DIAGNOSIS — E785 Hyperlipidemia, unspecified: Secondary | ICD-10-CM | POA: Diagnosis not present

## 2016-01-30 LAB — LIPID PANEL
CHOL/HDL RATIO: 3
CHOLESTEROL: 150 mg/dL (ref 0–200)
HDL: 53.3 mg/dL (ref >39.00)
LDL CALC: 82 mg/dL (ref 0–99)
NonHDL: 96.21
Triglycerides: 73 mg/dL (ref 0.0–149.0)
VLDL: 14.6 mg/dL (ref 0.0–40.0)

## 2016-01-30 LAB — BASIC METABOLIC PANEL
BUN: 9 mg/dL (ref 6–23)
CHLORIDE: 105 meq/L (ref 96–112)
CO2: 30 meq/L (ref 19–32)
Calcium: 9.4 mg/dL (ref 8.4–10.5)
Creatinine, Ser: 0.76 mg/dL (ref 0.40–1.20)
GFR: 79.66 mL/min (ref >60.00)
Glucose, Bld: 101 mg/dL — ABNORMAL HIGH (ref 70–99)
POTASSIUM: 4 meq/L (ref 3.5–5.1)
SODIUM: 143 meq/L (ref 135–145)

## 2016-01-30 LAB — TSH: TSH: 3.11 u[IU]/mL (ref 0.35–4.50)

## 2016-01-31 LAB — HEPATITIS C ANTIBODY: HCV Ab: NEGATIVE

## 2016-02-01 ENCOUNTER — Ambulatory Visit (INDEPENDENT_AMBULATORY_CARE_PROVIDER_SITE_OTHER): Payer: 59 | Admitting: Family Medicine

## 2016-02-01 ENCOUNTER — Encounter: Payer: Self-pay | Admitting: Family Medicine

## 2016-02-01 VITALS — BP 132/82 | HR 96 | Temp 97.9°F | Ht 62.0 in | Wt 134.5 lb

## 2016-02-01 DIAGNOSIS — G459 Transient cerebral ischemic attack, unspecified: Secondary | ICD-10-CM

## 2016-02-01 DIAGNOSIS — L409 Psoriasis, unspecified: Secondary | ICD-10-CM

## 2016-02-01 DIAGNOSIS — Z0000 Encounter for general adult medical examination without abnormal findings: Secondary | ICD-10-CM

## 2016-02-01 DIAGNOSIS — E785 Hyperlipidemia, unspecified: Secondary | ICD-10-CM | POA: Diagnosis not present

## 2016-02-01 DIAGNOSIS — Z7189 Other specified counseling: Secondary | ICD-10-CM

## 2016-02-01 MED ORDER — CLOPIDOGREL BISULFATE 75 MG PO TABS: 75.0000 mg | ORAL_TABLET | Freq: Every day | ORAL | 3 refills | Status: DC

## 2016-02-01 MED ORDER — PRAVASTATIN SODIUM 40 MG PO TABS: 40.0000 mg | ORAL_TABLET | Freq: Every day | ORAL | 3 refills | Status: DC

## 2016-02-01 MED FILL — CLOPIDOGREL 75 MG TABLET: 75 | 90 days supply | Qty: 90 | Fill #0

## 2016-02-01 NOTE — Progress Notes (Signed)
Pre visit review using our clinic review tool, if applicable. No additional management support is needed unless otherwise documented below in the visit note. 

## 2016-02-01 NOTE — Assessment & Plan Note (Signed)
Continue TCI spray

## 2016-02-01 NOTE — Assessment & Plan Note (Signed)
Advanced directives: discussed - HCPOA unsure will consider this. Needs to set up. Advanced directive packet provided today

## 2016-02-01 NOTE — Assessment & Plan Note (Signed)
Chronic, stable. Continue pravastatin. 

## 2016-02-01 NOTE — Patient Instructions (Addendum)
Try lubricating eye drops to left eye - if no improvement, schedule follow up with [facility].  Advanced directive packet provided today.  Return as needed or in 1 year for next physical.  Good to see you today, call us with questions.  Health Maintenance, Female Introduction Adopting a healthy lifestyle and getting preventive care can go a long way to promote health and wellness. Talk with your health care provider about what schedule of regular examinations is right for you. This is a good chance for you to check in with your provider about disease prevention and staying healthy. In between checkups, there are plenty of things you can do on your own. Experts have done a lot of research about which lifestyle changes and preventive measures are most likely to keep you healthy. Ask your health care provider for more information. Weight and diet Eat a healthy diet  Be sure to include plenty of vegetables, fruits, low-fat dairy products, and lean protein.  Do not eat a lot of foods high in solid fats, added sugars, or salt.  Get regular exercise. This is one of the most important things you can do for your health.  Most adults should exercise for at least 150 minutes each week. The exercise should increase your heart rate and make you sweat (moderate-intensity exercise).  Most adults should also do strengthening exercises at least twice a week. This is in addition to the moderate-intensity exercise. Maintain a healthy weight  Body mass index (BMI) is a measurement that can be used to identify possible weight problems. It estimates body fat based on height and weight. Your health care provider can help determine your BMI and help you achieve or maintain a healthy weight.  For females 28 years of age and older:  A BMI below 18.5 is considered underweight.  A BMI of 18.5 to 24.9 is normal.  A BMI of 25 to 29.9 is considered overweight.  A BMI of 30 and above is considered obese. Watch  levels of cholesterol and blood lipids  You should start having your blood tested for lipids and cholesterol at 72 years of age, then have this test every 5 years.  You may need to have your cholesterol levels checked more often if:  Your lipid or cholesterol levels are high.  You are older than 72 years of age.  You are at high risk for heart disease. Cancer screening Lung Cancer  Lung cancer screening is recommended for adults 27-9 years old who are at high risk for lung cancer because of a history of smoking.  A yearly low-dose CT scan of the lungs is recommended for people who:  Currently smoke.  Have quit within the past 15 years.  Have at least a 30-pack-year history of smoking. A pack year is smoking an average of one pack of cigarettes a day for 1 year.  Yearly screening should continue until it has been 15 years since you quit.  Yearly screening should stop if you develop a health problem that would prevent you from having lung cancer treatment. Breast Cancer  Practice breast self-awareness. This means understanding how your breasts normally appear and feel.  It also means doing regular breast self-exams. Let your health care provider know about any changes, no matter how small.  If you are in your 20s or 30s, you should have a clinical breast exam (CBE) by a health care provider every 1-3 years as part of a regular health exam.  If you are 40  or older, have a CBE every year. Also consider having a breast X-ray (mammogram) every year.  If you have a family history of breast cancer, talk to your health care provider about genetic screening.  If you are at high risk for breast cancer, talk to your health care provider about having an MRI and a mammogram every year.  Breast cancer gene (BRCA) assessment is recommended for women who have family members with BRCA-related cancers. BRCA-related cancers include:  Breast.  Ovarian.  Tubal.  Peritoneal  cancers.  Results of the assessment will determine the need for genetic counseling and BRCA1 and BRCA2 testing. Cervical Cancer  Your health care provider may recommend that you be screened regularly for cancer of the pelvic organs (ovaries, uterus, and vagina). This screening involves a pelvic examination, including checking for microscopic changes to the surface of your cervix (Pap test). You may be encouraged to have this screening done every 3 years, beginning at age 41.  For women ages 31-65, health care providers may recommend pelvic exams and Pap testing every 3 years, or they may recommend the Pap and pelvic exam, combined with testing for human papilloma virus (HPV), every 5 years. Some types of HPV increase your risk of cervical cancer. Testing for HPV may also be done on women of any age with unclear Pap test results.  Other health care providers may not recommend any screening for nonpregnant women who are considered low risk for pelvic cancer and who do not have symptoms. Ask your health care provider if a screening pelvic exam is right for you.  If you have had past treatment for cervical cancer or a condition that could lead to cancer, you need Pap tests and screening for cancer for at least 20 years after your treatment. If Pap tests have been discontinued, your risk factors (such as having a new sexual partner) need to be reassessed to determine if screening should resume. Some women have medical problems that increase the chance of getting cervical cancer. In these cases, your health care provider may recommend more frequent screening and Pap tests. Colorectal Cancer  This type of cancer can be detected and often prevented.  Routine colorectal cancer screening usually begins at 72 years of age and continues through 72 years of age.  Your health care provider may recommend screening at an earlier age if you have risk factors for colon cancer.  Your health care provider may also  recommend using home test kits to check for hidden blood in the stool.  A small camera at the end of a tube can be used to examine your colon directly (sigmoidoscopy or colonoscopy). This is done to check for the earliest forms of colorectal cancer.  Routine screening usually begins at age 6.  Direct examination of the colon should be repeated every 5-10 years through 72 years of age. However, you may need to be screened more often if early forms of precancerous polyps or small growths are found. Skin Cancer  Check your skin from head to toe regularly.  Tell your health care provider about any new moles or changes in moles, especially if there is a change in a mole's shape or color.  Also tell your health care provider if you have a mole that is larger than the size of a pencil eraser.  Always use sunscreen. Apply sunscreen liberally and repeatedly throughout the day.  Protect yourself by wearing long sleeves, pants, a wide-brimmed hat, and sunglasses whenever you are outside. Heart  disease, diabetes, and high blood pressure  High blood pressure causes heart disease and increases the risk of stroke. High blood pressure is more likely to develop in:  People who have blood pressure in the high end of the normal range (130-139/85-89 mm Hg).  People who are overweight or obese.  People who are African American.  If you are 46-1 years of age, have your blood pressure checked every 3-5 years. If you are 29 years of age or older, have your blood pressure checked every year. You should have your blood pressure measured twice-once when you are at a hospital or clinic, and once when you are not at a hospital or clinic. Record the average of the two measurements. To check your blood pressure when you are not at a hospital or clinic, you can use:  An automated blood pressure machine at a pharmacy.  A home blood pressure monitor.  If you are between 66 years and 73 years old, ask your health  care provider if you should take aspirin to prevent strokes.  Have regular diabetes screenings. This involves taking a blood sample to check your fasting blood sugar level.  If you are at a normal weight and have a low risk for diabetes, have this test once every three years after 72 years of age.  If you are overweight and have a high risk for diabetes, consider being tested at a younger age or more often. Preventing infection Hepatitis B  If you have a higher risk for hepatitis B, you should be screened for this virus. You are considered at high risk for hepatitis B if:  You were born in a country where hepatitis B is common. Ask your health care provider which countries are considered high risk.  Your parents were born in a high-risk country, and you have not been immunized against hepatitis B (hepatitis B vaccine).  You have HIV or AIDS.  You use needles to inject street drugs.  You live with someone who has hepatitis B.  You have had sex with someone who has hepatitis B.  You get hemodialysis treatment.  You take certain medicines for conditions, including cancer, organ transplantation, and autoimmune conditions. Hepatitis C  Blood testing is recommended for:  Everyone born from [birth year] through 1966.  Anyone with known risk factors for hepatitis C. Sexually transmitted infections (STIs)  You should be screened for sexually transmitted infections (STIs) including gonorrhea and chlamydia if:  You are sexually active and are younger than 72 years of age.  You are older than 72 years of age and your health care provider tells you that you are at risk for this type of infection.  Your sexual activity has changed since you were last screened and you are at an increased risk for chlamydia or gonorrhea. Ask your health care provider if you are at risk.  If you do not have HIV, but are at risk, it may be recommended that you take a prescription medicine daily to prevent HIV  infection. This is called pre-exposure prophylaxis (PrEP). You are considered at risk if:  You are sexually active and do not regularly use condoms or know the HIV status of your partner(s).  You take drugs by injection.  You are sexually active with a partner who has HIV. Talk with your health care provider about whether you are at high risk of being infected with HIV. If you choose to begin PrEP, you should first be tested for HIV. You should then be  tested every 3 months for as long as you are taking PrEP. Pregnancy  If you are premenopausal and you may become pregnant, ask your health care provider about preconception counseling.  If you may become pregnant, take 400 to 800 micrograms (mcg) of folic acid every day.  If you want to prevent pregnancy, talk to your health care provider about birth control (contraception). Osteoporosis and menopause  Osteoporosis is a disease in which the bones lose minerals and strength with aging. This can result in serious bone fractures. Your risk for osteoporosis can be identified using a bone density scan.  If you are 25 years of age or older, or if you are at risk for osteoporosis and fractures, ask your health care provider if you should be screened.  Ask your health care provider whether you should take a calcium or vitamin D supplement to lower your risk for osteoporosis.  Menopause may have certain physical symptoms and risks.  Hormone replacement therapy may reduce some of these symptoms and risks. Talk to your health care provider about whether hormone replacement therapy is right for you. Follow these instructions at home:  Schedule regular health, dental, and eye exams.  Stay current with your immunizations.  Do not use any tobacco products including cigarettes, chewing tobacco, or electronic cigarettes.  If you are pregnant, do not drink alcohol.  If you are breastfeeding, limit how much and how often you drink alcohol.  Limit  alcohol intake to no more than 1 drink per day for nonpregnant women. One drink equals 12 ounces of beer, 5 ounces of wine, or 1 ounces of hard liquor.  Do not use street drugs.  Do not share needles.  Ask your health care provider for help if you need support or information about quitting drugs.  Tell your health care provider if you often feel depressed.  Tell your health care provider if you have ever been abused or do not feel safe at home. This information is not intended to replace advice given to you by your health care provider. Make sure you discuss any questions you have with your health care provider. Document Released: 07/08/2010 Document Revised: 05/31/2015 Document Reviewed: 09/26/2014  2017 Elsevier

## 2016-02-01 NOTE — Progress Notes (Signed)
BP 132/82   Pulse 96   Temp 97.9 F (36.6 C) (Oral)   Ht 5\' 2"  (1.575 m)   Wt 134 lb 8 oz (61 kg)   BMI 24.60 kg/m    CC: CPE Subjective:    Patient ID: Marilyn Cook, female    DOB: [birth year], 71 y.o.   MRN: [id number]  HPI: Marilyn Cook is a 71 y.o. female presenting on 02/01/2016 for Annual Exam   Continues working part time at [employer].  Preventative: COLONOSCOPY 04/2015 diverticulosis, int hem, rpt 10 yrs (Nandigam) Mammogram 03/2015 - normal. Gets yearly.  Pap smear - Partial hysterectomy for benign reason (fibroids), ovaries remain. Bimanual only done 2015.  DEXA Date: 08/2012 spine WNL, hip T -1.2 Flu shot yearly (egg allergy) Pneumovax 07/31/2011, prevnar 01/2015 Tdap 07/2011 zostavax - 11/2015 Advanced directives: discussed - HCPOA unsure will consider this. Needs to set up. Advanced directive packet provided today.  Seat belt use discussed Sunscreen use discussed - allergic to sunscreen. Discussed avoiding sun and wearing wide brimmed hat. No changing moles on skin.  Non smoker Alcohol - none  Married and lives with husband Marilyn Cook One son Occupation: works at [employer] at [employer] Activity: no regular exercise, active through rehab Diet: some water, fruits/vegetables daily  Relevant past medical, surgical, family and social history reviewed and updated as indicated. Interim medical history since our last visit reviewed. Allergies and medications reviewed and updated. Current Outpatient Prescriptions on File Prior to Visit  Medication Sig  . Ascorbic Acid (VITAMIN C PO) Take 1 capsule by mouth daily.  . Calcium Carb-Cholecalciferol (CALCIUM-VITAMIN D) 600-400 MG-UNIT TABS Take 1 tablet by mouth 2 (two) times daily.   . cetirizine (ZYRTEC) 10 MG tablet Take 10 mg by mouth daily.  . Cinnamon 500 MG capsule Take 1,000 mg by mouth 2 (two) times daily.   [name] co-enzyme Q-10 30 MG capsule Take 30 mg by mouth daily.  [name] CRANBERRY PO Take 1 tablet by mouth daily.   .  hydrocortisone 2.5 % cream Apply 1 application topically 2 (two) times daily as needed (itching).   . Misc Natural Products (BLACK CHERRY CONCENTRATE PO) Take 1 tablet by mouth daily.   . Potassium Chloride (K-TABS PO) Take 1 tablet by mouth daily.   . Pyridoxine HCl (VITAMIN B-6 PO) Take 1 tablet by mouth daily.  [name] triamcinolone (KENALOG) 0.147 MG/GM topical spray Apply topically 2 (two) times daily as needed. For psoriasis  . vitamin B-12 (CYANOCOBALAMIN) 1000 MCG tablet Take 1,000 mcg by mouth daily.   No current facility-administered medications on file prior to visit.     Review of Systems  Constitutional: Negative for activity change, appetite change, chills, fatigue, fever and unexpected weight change.  HENT: Negative for hearing loss.   Eyes: Negative for visual disturbance.  Respiratory: Negative for cough, chest tightness, shortness of breath and wheezing.   Cardiovascular: Negative for chest pain, palpitations and leg swelling.  Gastrointestinal: Negative for abdominal distention, abdominal pain, blood in stool, constipation, diarrhea, nausea and vomiting.  Genitourinary: Negative for difficulty urinating and hematuria.  Musculoskeletal: Negative for arthralgias, myalgias and neck pain.  Skin: Negative for rash.  Neurological: Negative for dizziness, seizures, syncope and headaches.  Hematological: Negative for adenopathy. Bruises/bleeds easily.  Psychiatric/Behavioral: Positive for dysphoric mood. The patient is not nervous/anxious.    Per HPI unless specifically indicated in ROS section     Objective:    BP 132/82   Pulse 96   Temp 97.9 F (  36.6 C) (Oral)   Ht 5\' 2"  (1.575 m)   Wt 134 lb 8 oz (61 kg)   BMI 24.60 kg/m   Wt Readings from Last 3 Encounters:  02/01/16 134 lb 8 oz (61 kg)  11/23/15 134 lb 12 oz (61.1 kg)  07/24/15 130 lb 8 oz (59.2 kg)    Physical Exam  Constitutional: She is oriented to person, place, and time. She appears well-developed and  well-nourished. No distress.  HENT:  Head: Normocephalic and atraumatic.  Right Ear: Hearing, tympanic membrane, external ear and ear canal normal.  Left Ear: Hearing, tympanic membrane, external ear and ear canal normal.  Nose: Nose normal.  Mouth/Throat: Uvula is midline, oropharynx is clear and moist and mucous membranes are normal. No oropharyngeal exudate, posterior oropharyngeal edema or posterior oropharyngeal erythema.  Eyes: Conjunctivae and EOM are normal. Pupils are equal, round, and reactive to light. No scleral icterus.  Neck: Normal range of motion. Neck supple. Carotid bruit is not present. No thyromegaly present.  Cardiovascular: Normal rate, regular rhythm, normal heart sounds and intact distal pulses.   No murmur heard. Pulses:      Radial pulses are 2+ on the right side, and 2+ on the left side.  Pulmonary/Chest: Effort normal and breath sounds normal. No respiratory distress. She has no wheezes. She has no rales.  Abdominal: Soft. Bowel sounds are normal. She exhibits no distension and no mass. There is no tenderness. There is no rebound and no guarding.  Musculoskeletal: Normal range of motion. She exhibits no edema.  Lymphadenopathy:    She has no cervical adenopathy.  Neurological: She is alert and oriented to person, place, and time.  CN grossly intact, station and gait intact  Skin: Skin is warm and dry. No rash noted.  Psychiatric: She has a normal mood and affect. Her behavior is normal. Judgment and thought content normal.  Nursing note and vitals reviewed.  Results for orders placed or performed in visit on 01/30/16  Lipid panel  Result Value Ref Range   Cholesterol 150 0 - 200 mg/dL   Triglycerides 73.0 0.0 - 149.0 mg/dL   HDL 53.30 >39.00 mg/dL   VLDL 14.6 0.0 - 40.0 mg/dL   LDL Cholesterol 82 0 - 99 mg/dL   Total CHOL/HDL Ratio 3    NonHDL 96.21   TSH  Result Value Ref Range   TSH 3.11 0.35 - 4.50 uIU/mL  Basic metabolic panel  Result Value Ref  Range   Sodium 143 135 - 145 mEq/L   Potassium 4.0 3.5 - 5.1 mEq/L   Chloride 105 96 - 112 mEq/L   CO2 30 19 - 32 mEq/L   Glucose, Bld 101 (H) 70 - 99 mg/dL   BUN 9 6 - 23 mg/dL   Creatinine, Ser 0.76 0.40 - 1.20 mg/dL   Calcium 9.4 8.4 - 10.5 mg/dL   GFR 79.66 >60.00 mL/min  Hepatitis C antibody  Result Value Ref Range   HCV Ab NEGATIVE NEGATIVE      Assessment & Plan:   Problem List Items Addressed This Visit    Advanced care planning/counseling discussion    Advanced directives: discussed - HCPOA unsure will consider this. Needs to set up. Advanced directive packet provided today      Healthcare maintenance - Primary    Preventative protocols reviewed and updated unless pt declined. Discussed healthy diet and lifestyle.       HLD (hyperlipidemia)    Chronic, stable. Continue pravastatin.  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  Continues pravastatin and plavix.  Doesn't like plavix side effect.       Relevant Medications   pravastatin (PRAVACHOL) 40 MG tablet       Follow up plan: Return in about 1 year (around 01/31/2017), or as needed, for annual exam, prior fasting for blood work.  Ria Bush, MD

## 2016-02-01 NOTE — Assessment & Plan Note (Signed)
Preventative protocols reviewed and updated unless pt declined. Discussed healthy diet and lifestyle.  

## 2016-02-01 NOTE — Assessment & Plan Note (Addendum)
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  Continues pravastatin and plavix.  Doesn't like plavix side effect.

## 2016-02-07 NOTE — Telephone Encounter (Signed)
Marilyn Cook was only build for the shingles vaccine, the administration of it, and her OV for that day.  Please notify patient and let her know.  If she has further questions please let me know.  Thank you!

## 2016-02-08 NOTE — Telephone Encounter (Signed)
Spoke with pt when she called back, she understood.

## 2016-02-08 NOTE — Telephone Encounter (Signed)
Left message asking pt to call office  °

## 2016-04-04 DIAGNOSIS — Z1231 Encounter for screening mammogram for malignant neoplasm of breast: Secondary | ICD-10-CM | POA: Diagnosis not present

## 2016-04-04 LAB — HM MAMMOGRAPHY

## 2016-04-09 ENCOUNTER — Other Ambulatory Visit: Payer: Self-pay | Admitting: Family Medicine

## 2016-04-09 MED FILL — PRAVASTATIN NA 40 MG TAB: 40 | 90 days supply | Qty: 90 | Fill #0

## 2016-04-09 MED FILL — TRIAMCINOLONE 0.147 MG/G SP: 0.147 | 30 days supply | Qty: 63 | Fill #0

## 2016-04-10 ENCOUNTER — Encounter: Payer: Self-pay | Admitting: *Deleted

## 2016-04-24 MED FILL — CLOPIDOGREL 75 MG TABLET: 75 | 30 days supply | Qty: 30 | Fill #2

## 2016-05-27 ENCOUNTER — Other Ambulatory Visit: Payer: Self-pay | Admitting: Family Medicine

## 2016-05-27 MED FILL — CLOPIDOGREL 75 MG TABLET: 75 | 90 days supply | Qty: 90 | Fill #1

## 2016-07-08 MED FILL — PRAVASTATIN NA 40 MG TAB: 40 | 90 days supply | Qty: 90 | Fill #1

## 2016-08-01 ENCOUNTER — Ambulatory Visit (INDEPENDENT_AMBULATORY_CARE_PROVIDER_SITE_OTHER): Payer: 59

## 2016-08-01 ENCOUNTER — Ambulatory Visit (INDEPENDENT_AMBULATORY_CARE_PROVIDER_SITE_OTHER): Payer: 59 | Admitting: Orthopaedic Surgery

## 2016-08-01 ENCOUNTER — Encounter (INDEPENDENT_AMBULATORY_CARE_PROVIDER_SITE_OTHER): Payer: Self-pay | Admitting: Orthopaedic Surgery

## 2016-08-01 VITALS — BP 136/75 | HR 69 | Ht 62.0 in

## 2016-08-01 DIAGNOSIS — M545 Low back pain: Secondary | ICD-10-CM | POA: Diagnosis not present

## 2016-08-01 DIAGNOSIS — M25551 Pain in right hip: Secondary | ICD-10-CM | POA: Diagnosis not present

## 2016-08-01 NOTE — Progress Notes (Signed)
Office Visit Note   Patient: Marilyn Cook           Date of Birth: 1945/01/18           MRN: [id number] Visit Date: 08/01/2016              Requested by: Ria Bush, MD [address] [place], [place] [address] PCP: Ria Bush, MD   Assessment & Plan: Visit Diagnoses:  1. Acute bilateral low back pain, with sciatica presence unspecified   2. Pain in right hip     Plan: Patient may have some lateral recess narrowing with some minimal radicular symptoms. Recurrent her symptoms are not significant enough to consider further workup. She had a family member had bone cancer is concerned she might have bone cancer and x-ray images reviewed and she was re-assured. No lytic or sclerotic lesions noted. She'll return if she has increased symptoms.  Follow-Up Instructions: Return if symptoms worsen or fail to improve.   Orders:  Orders Placed This Encounter  Procedures  . XR Pelvis 1-2 Views  . XR Lumbar Spine 2-3 Views   No orders of the defined types were placed in this encounter.     Procedures: No procedures performed   Clinical Data: No additional findings.   Subjective: Chief Complaint  Patient presents with  . Lower Back - Pain  . Right Leg - Pain    HPI 71 year old female has noticed some aching pain in her right buttocks right lateral thigh. Sometimes when she was laying supine try to cross her leg over should reach down and lift her thigh due to discomfort. Occasionally her legs felt a little weak. She's had all along with a rail when she goes downstairs to make sure she is secure. She takes Plavix she does a lot of bending and stooping at work that doesn't seem to bother particular. She denies any associated bowel or bladder symptoms no chills or fever. She relates a one time she did have some problems with bulging disc.  Review of Systems positive for a history of the TIA, impingement right shoulder, high cholesterol. Negative for MI negative for  lumbar spinal surgery. Otherwise negative as it pertains to history of present illness 14 point review of systems.   Objective: Vital Signs: BP 136/75   Pulse 69   Ht 5\' 2"  (1.575 m)   Physical Exam  Constitutional: She is oriented to person, place, and time. She appears well-developed.  HENT:  Head: Normocephalic.  Right Ear: External ear normal.  Left Ear: External ear normal.  Eyes: Pupils are equal, round, and reactive to light.  Neck: No tracheal deviation present. No thyromegaly present.  Cardiovascular: Normal rate.   Pulmonary/Chest: Effort normal.  Abdominal: Soft.  Musculoskeletal:  Patient is some mild sciatic notch tenderness on the right negative straight leg raising 90 reflexes are 2+ isolated motor weakness she is able heel and toe walk negative Trendelenburg. Minimal trochanteric bursal tenderness on the right negative on the left. No quad hamstring or hip abductor weakness. The pulses are intact no lymphadenopathy.  Neurological: She is alert and oriented to person, place, and time.  Skin: Skin is warm and dry.  Psychiatric: She has a normal mood and affect. Her behavior is normal.    Ortho Exam  Specialty Comments:  No specialty comments available.  Imaging: No results found.   PMFS History: Patient Active Problem List   Diagnosis Date Noted  . Crackling sound in left ear 11/23/2015  .  Advanced care planning/counseling discussion 01/23/2015  . Chronic right shoulder pain 10/14/2014  . TIA (transient ischemic attack) 06/08/2014  . Palpitations 03/28/2013  . 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 Scalp psoriasis   . Healthcare maintenance 07/31/2011  . Sleep disturbance 04/14/2011  . DIVERTICULOSIS OF COLON 10/27/2007  . HLD (hyperlipidemia) 06/30/2006  . ALLERGY, ENVIRONMENTAL 06/30/2006   Past Medical History:  Diagnosis Date  . Benign tumor of breast 1975  . Complication of anesthesia    hard to wake up  . Hyperlipemia   . NSVD (normal spontaneous  vaginal delivery)    x 1  . Osteopenia 08/2012   spine WNL, hip T -1.2  . Scalp psoriasis   . Stroke [facility])    possible TIA in 06/2014  . TIA (transient ischemic attack) 06/08/2014    Family History  Problem Relation Age of Onset  . Heart disease Father        MI   . Stroke Father        CVA  . Stroke Mother        CVA  . Hypertension Sister   . Cancer Sister        ovarian and colon cancer  . Aneurysm Brother   . Hypertension Brother   . Cancer Paternal Aunt        ? colon  . Cancer Paternal Grandfather        liver, alcohol  . Alcohol abuse Paternal Grandfather   . Hypertension Brother   . Cancer Other        prostate  . Cancer Maternal Aunt        breast    Past Surgical History:  Procedure Laterality Date  . ABDOMINAL HYSTERECTOMY    . COLONOSCOPY  04/2015   diverticulosis, int hem, rpt 10 yrs (Nandigam)  . DEXA  08/2012   spine WNL, hip T -1.2  . PARTIAL HYSTERECTOMY  1987   due to fibroids, ovaries remain  . R Lat Epicondyle Injections  1998  . SHOULDER ARTHROSCOPY Right 11/2014   arthroscopic debridement and mini open rotator cuff repair of R shoulder for chronic impingement and rotator cuff tear by MRI [facility])  . Spiral CT  04/02   (-)   Social History   Occupational History  . HR     new hires   Social History Main Topics  . Smoking status: Never Smoker  . Smokeless tobacco: Never Used  . Alcohol use No  . Drug use: No  . Sexual activity: Yes

## 2016-08-04 ENCOUNTER — Other Ambulatory Visit: Payer: Self-pay | Admitting: Family Medicine

## 2016-08-08 DIAGNOSIS — L821 Other seborrheic keratosis: Secondary | ICD-10-CM | POA: Diagnosis not present

## 2016-08-08 DIAGNOSIS — L400 Psoriasis vulgaris: Secondary | ICD-10-CM | POA: Diagnosis not present

## 2016-08-08 DIAGNOSIS — L814 Other melanin hyperpigmentation: Secondary | ICD-10-CM | POA: Diagnosis not present

## 2016-08-08 DIAGNOSIS — D225 Melanocytic nevi of trunk: Secondary | ICD-10-CM | POA: Diagnosis not present

## 2016-08-08 DIAGNOSIS — L84 Corns and callosities: Secondary | ICD-10-CM | POA: Diagnosis not present

## 2016-08-08 DIAGNOSIS — D171 Benign lipomatous neoplasm of skin and subcutaneous tissue of trunk: Secondary | ICD-10-CM | POA: Diagnosis not present

## 2016-08-08 DIAGNOSIS — D2261 Melanocytic nevi of right upper limb, including shoulder: Secondary | ICD-10-CM | POA: Diagnosis not present

## 2016-08-08 DIAGNOSIS — L918 Other hypertrophic disorders of the skin: Secondary | ICD-10-CM | POA: Diagnosis not present

## 2016-08-08 DIAGNOSIS — L570 Actinic keratosis: Secondary | ICD-10-CM | POA: Diagnosis not present

## 2016-08-08 DIAGNOSIS — D2262 Melanocytic nevi of left upper limb, including shoulder: Secondary | ICD-10-CM | POA: Diagnosis not present

## 2016-08-15 DIAGNOSIS — L570 Actinic keratosis: Secondary | ICD-10-CM | POA: Diagnosis not present

## 2016-09-01 MED FILL — CLOPIDOGREL 75 MG TABLET: 75 | 90 days supply | Qty: 90 | Fill #2

## 2016-10-09 MED FILL — PRAVASTATIN NA 40 MG TAB: 40 | 90 days supply | Qty: 90 | Fill #2

## 2016-12-01 MED FILL — CLOPIDOGREL 75 MG TABLET: 75 | 90 days supply | Qty: 90 | Fill #3

## 2017-01-12 MED FILL — PRAVASTATIN NA 40 MG TAB: 40 | 90 days supply | Qty: 90 | Fill #3

## 2017-01-29 ENCOUNTER — Other Ambulatory Visit: Payer: Self-pay | Admitting: Family Medicine

## 2017-01-29 DIAGNOSIS — E785 Hyperlipidemia, unspecified: Secondary | ICD-10-CM

## 2017-01-30 ENCOUNTER — Other Ambulatory Visit (INDEPENDENT_AMBULATORY_CARE_PROVIDER_SITE_OTHER): Payer: 59

## 2017-01-30 DIAGNOSIS — E785 Hyperlipidemia, unspecified: Secondary | ICD-10-CM | POA: Diagnosis not present

## 2017-01-30 LAB — COMPREHENSIVE METABOLIC PANEL
ALBUMIN: 4.2 g/dL (ref 3.5–5.2)
ALK PHOS: 54 U/L (ref 39–117)
ALT: 11 U/L (ref 0–35)
AST: 16 U/L (ref 0–37)
BILIRUBIN TOTAL: 0.4 mg/dL (ref 0.2–1.2)
BUN: 12 mg/dL (ref 6–23)
CO2: 30 mEq/L (ref 19–32)
CREATININE: 0.87 mg/dL (ref 0.40–1.20)
Calcium: 8.9 mg/dL (ref 8.4–10.5)
Chloride: 104 mEq/L (ref 96–112)
GFR: 67.96 mL/min (ref >60.00)
Glucose, Bld: 122 mg/dL — ABNORMAL HIGH (ref 70–99)
Potassium: 3.9 mEq/L (ref 3.5–5.1)
SODIUM: 143 meq/L (ref 135–145)
TOTAL PROTEIN: 7 g/dL (ref 6.0–8.3)

## 2017-01-30 LAB — LIPID PANEL
CHOLESTEROL: 148 mg/dL (ref 0–200)
HDL: 50.7 mg/dL (ref >39.00)
LDL Cholesterol: 75 mg/dL (ref 0–99)
NonHDL: 97.45
Total CHOL/HDL Ratio: 3
Triglycerides: 111 mg/dL (ref 0.0–149.0)
VLDL: 22.2 mg/dL (ref 0.0–40.0)

## 2017-01-30 LAB — TSH: TSH: 3.86 u[IU]/mL (ref 0.35–4.50)

## 2017-02-02 ENCOUNTER — Telehealth: Payer: Self-pay

## 2017-02-02 NOTE — Telephone Encounter (Signed)
PLEASE NOTE: All timestamps contained within this report are represented as [place] Standard Time. CONFIDENTIALTY NOTICE: This fax transmission is intended only for the addressee. It contains information that is legally privileged, confidential or otherwise protected from use or disclosure. If you are not the intended recipient, you are strictly prohibited from reviewing, disclosing, copying using or disseminating any of this information or taking any action in reliance on or regarding this information. If you have received this fax in error, please notify us immediately by telephone so that we can arrange for its return to [place]. Phone: [phone], Toll-Free: [phone], Fax: [phone] Page: 1 of 1 Call Id: [id number] [place] Night - Client Nonclinical Telephone Record [place] Night - Client Client Site [place] - Night Contact Type Call Who Is Calling Patient / Member / Family / Caregiver Caller Name [place] Phone Number [phone] Call Type Message Only Information Provided Reason for Call Returning a Call from the Office Initial [place] said someone from the office called her and she was returning the call. She would like a call back to find out why the office called. Dr. Danise Mina. Additional Comment Call Closed By: Corky Downs Transaction Date/Time: 01/30/2017 6:15:30 PM (ET)

## 2017-02-03 NOTE — Telephone Encounter (Signed)
Spoke with pt informing her I do not see any messages, lab results or imaging results that indicated someone called from our office. She says ok.

## 2017-02-04 ENCOUNTER — Encounter: Payer: Self-pay | Admitting: Family Medicine

## 2017-02-04 ENCOUNTER — Ambulatory Visit (INDEPENDENT_AMBULATORY_CARE_PROVIDER_SITE_OTHER): Payer: 59 | Admitting: Family Medicine

## 2017-02-04 VITALS — BP 118/68 | HR 83 | Temp 98.2°F | Ht 61.25 in | Wt 132.5 lb

## 2017-02-04 DIAGNOSIS — Z0000 Encounter for general adult medical examination without abnormal findings: Secondary | ICD-10-CM

## 2017-02-04 DIAGNOSIS — G459 Transient cerebral ischemic attack, unspecified: Secondary | ICD-10-CM | POA: Diagnosis not present

## 2017-02-04 DIAGNOSIS — L409 Psoriasis, unspecified: Secondary | ICD-10-CM | POA: Diagnosis not present

## 2017-02-04 DIAGNOSIS — E785 Hyperlipidemia, unspecified: Secondary | ICD-10-CM | POA: Diagnosis not present

## 2017-02-04 DIAGNOSIS — Z7189 Other specified counseling: Secondary | ICD-10-CM

## 2017-02-04 MED ORDER — CLOPIDOGREL BISULFATE 75 MG PO TABS: 75.0000 mg | ORAL_TABLET | Freq: Every day | ORAL | 11 refills | Status: DC

## 2017-02-04 MED ORDER — KENALOG 0.147 MG/GM EX AERS: INHALATION_SPRAY | Freq: Two times a day (BID) | CUTANEOUS | 1 refills | Status: DC | PRN

## 2017-02-04 MED ORDER — PRAVASTATIN SODIUM 40 MG PO TABS: 40.0000 mg | ORAL_TABLET | Freq: Every day | ORAL | 3 refills | Status: DC

## 2017-02-04 NOTE — Assessment & Plan Note (Signed)
Advanced directives: discussed - thinks HCPOA would be son. Thinks she filled this out. Asked to bring in to update chart. ?

## 2017-02-04 NOTE — Patient Instructions (Addendum)
If interested, check with pharmacy about new 2 shot shingles series (shingrix).  Bring [place] a copy of your advanced directives to update chart.  Good to see you today, call us with questions. Return as needed or in 1 year for next physical. Return for welcome to medicare visit when you get medicare part B.   Health Maintenance, Female Adopting a healthy lifestyle and getting preventive care can go a long way to promote health and wellness. Talk with your health care provider about what schedule of regular examinations is right for you. This is a good chance for you to check in with your provider about disease prevention and staying healthy. In between checkups, there are plenty of things you can do on your own. Experts have done a lot of research about which lifestyle changes and preventive measures are most likely to keep you healthy. Ask your health care provider for more information. Weight and diet Eat a healthy diet  Be sure to include plenty of vegetables, fruits, low-fat dairy products, and lean protein.  Do not eat a lot of foods high in solid fats, added sugars, or salt.  Get regular exercise. This is one of the most important things you can do for your health. ? Most adults should exercise for at least 150 minutes each week. The exercise should increase your heart rate and make you sweat (moderate-intensity exercise). ? Most adults should also do strengthening exercises at least twice a week. This is in addition to the moderate-intensity exercise.  Maintain a healthy weight  Body mass index (BMI) is a measurement that can be used to identify possible weight problems. It estimates body fat based on height and weight. Your health care provider can help determine your BMI and help you achieve or maintain a healthy weight.  For females 13 years of age and older: ? A BMI below 18.5 is considered underweight. ? A BMI of 18.5 to 24.9 is normal. ? A BMI of 25 to 29.9 is considered  overweight. ? A BMI of 30 and above is considered obese.  Watch levels of cholesterol and blood lipids  You should start having your blood tested for lipids and cholesterol at 73 years of age, then have this test every 5 years.  You may need to have your cholesterol levels checked more often if: ? Your lipid or cholesterol levels are high. ? You are older than 73 years of age. ? You are at high risk for heart disease.  Cancer screening Lung Cancer  Lung cancer screening is recommended for adults 54-21 years old who are at high risk for lung cancer because of a history of smoking.  A yearly low-dose CT scan of the lungs is recommended for people who: ? Currently smoke. ? Have quit within the past 15 years. ? Have at least a 30-pack-year history of smoking. A pack year is smoking an average of one pack of cigarettes a day for 1 year.  Yearly screening should continue until it has been 15 years since you quit.  Yearly screening should stop if you develop a health problem that would prevent you from having lung cancer treatment.  Breast Cancer  Practice breast self-awareness. This means understanding how your breasts normally appear and feel.  It also means doing regular breast self-exams. Let your health care provider know about any changes, no matter how small.  If you are in your 20s or 30s, you should have a clinical breast exam (CBE) by a health care  provider every 1-3 years as part of a regular health exam.  If you are 44 or older, have a CBE every year. Also consider having a breast X-ray (mammogram) every year.  If you have a family history of breast cancer, talk to your health care provider about genetic screening.  If you are at high risk for breast cancer, talk to your health care provider about having an MRI and a mammogram every year.  Breast cancer gene (BRCA) assessment is recommended for women who have family members with BRCA-related cancers. BRCA-related cancers  include: ? Breast. ? Ovarian. ? Tubal. ? Peritoneal cancers.  Results of the assessment will determine the need for genetic counseling and BRCA1 and BRCA2 testing.  Cervical Cancer Your health care provider may recommend that you be screened regularly for cancer of the pelvic organs (ovaries, uterus, and vagina). This screening involves a pelvic examination, including checking for microscopic changes to the surface of your cervix (Pap test). You may be encouraged to have this screening done every 3 years, beginning at age 3.  For women ages 27-65, health care providers may recommend pelvic exams and Pap testing every 3 years, or they may recommend the Pap and pelvic exam, combined with testing for human papilloma virus (HPV), every 5 years. Some types of HPV increase your risk of cervical cancer. Testing for HPV may also be done on women of any age with unclear Pap test results.  Other health care providers may not recommend any screening for nonpregnant women who are considered low risk for pelvic cancer and who do not have symptoms. Ask your health care provider if a screening pelvic exam is right for you.  If you have had past treatment for cervical cancer or a condition that could lead to cancer, you need Pap tests and screening for cancer for at least 20 years after your treatment. If Pap tests have been discontinued, your risk factors (such as having a new sexual partner) need to be reassessed to determine if screening should resume. Some women have medical problems that increase the chance of getting cervical cancer. In these cases, your health care provider may recommend more frequent screening and Pap tests.  Colorectal Cancer  This type of cancer can be detected and often prevented.  Routine colorectal cancer screening usually begins at 73 years of age and continues through 73 years of age.  Your health care provider may recommend screening at an earlier age if you have risk factors  for colon cancer.  Your health care provider may also recommend using home test kits to check for hidden blood in the stool.  A small camera at the end of a tube can be used to examine your colon directly (sigmoidoscopy or colonoscopy). This is done to check for the earliest forms of colorectal cancer.  Routine screening usually begins at age 27.  Direct examination of the colon should be repeated every 5-10 years through 73 years of age. However, you may need to be screened more often if early forms of precancerous polyps or small growths are found.  Skin Cancer  Check your skin from head to toe regularly.  Tell your health care provider about any new moles or changes in moles, especially if there is a change in a mole's shape or color.  Also tell your health care provider if you have a mole that is larger than the size of a pencil eraser.  Always use sunscreen. Apply sunscreen liberally and repeatedly throughout the day.  Protect yourself by wearing long sleeves, pants, a wide-brimmed hat, and sunglasses whenever you are outside.  Heart disease, diabetes, and high blood pressure  High blood pressure causes heart disease and increases the risk of stroke. High blood pressure is more likely to develop in: ? People who have blood pressure in the high end of the normal range (130-139/85-89 mm Hg). ? People who are overweight or obese. ? People who are African American.  If you are 57-39 years of age, have your blood pressure checked every 3-5 years. If you are 19 years of age or older, have your blood pressure checked every year. You should have your blood pressure measured twice-once when you are at a hospital or clinic, and once when you are not at a hospital or clinic. Record the average of the two measurements. To check your blood pressure when you are not at a hospital or clinic, you can use: ? An automated blood pressure machine at a pharmacy. ? A home blood pressure monitor.  If  you are between 19 years and 38 years old, ask your health care provider if you should take aspirin to prevent strokes.  Have regular diabetes screenings. This involves taking a blood sample to check your fasting blood sugar level. ? If you are at a normal weight and have a low risk for diabetes, have this test once every three years after 73 years of age. ? If you are overweight and have a high risk for diabetes, consider being tested at a younger age or more often. Preventing infection Hepatitis B  If you have a higher risk for hepatitis B, you should be screened for this virus. You are considered at high risk for hepatitis B if: ? You were born in a country where hepatitis B is common. Ask your health care provider which countries are considered high risk. ? Your parents were born in a high-risk country, and you have not been immunized against hepatitis B (hepatitis B vaccine). ? You have HIV or AIDS. ? You use needles to inject street drugs. ? You live with someone who has hepatitis B. ? You have had sex with someone who has hepatitis B. ? You get hemodialysis treatment. ? You take certain medicines for conditions, including cancer, organ transplantation, and autoimmune conditions.  Hepatitis C  Blood testing is recommended for: ? Everyone born from [birth year] through 1966. ? Anyone with known risk factors for hepatitis C.  Sexually transmitted infections (STIs)  You should be screened for sexually transmitted infections (STIs) including gonorrhea and chlamydia if: ? You are sexually active and are younger than 73 years of age. ? You are older than 73 years of age and your health care provider tells you that you are at risk for this type of infection. ? Your sexual activity has changed since you were last screened and you are at an increased risk for chlamydia or gonorrhea. Ask your health care provider if you are at risk.  If you do not have HIV, but are at risk, it may be recommended  that you take a prescription medicine daily to prevent HIV infection. This is called pre-exposure prophylaxis (PrEP). You are considered at risk if: ? You are sexually active and do not regularly use condoms or know the HIV status of your partner(s). ? You take drugs by injection. ? You are sexually active with a partner who has HIV.  Talk with your health care provider about whether you are at high risk of  being infected with HIV. If you choose to begin PrEP, you should first be tested for HIV. You should then be tested every 3 months for as long as you are taking PrEP. Pregnancy  If you are premenopausal and you may become pregnant, ask your health care provider about preconception counseling.  If you may become pregnant, take 400 to 800 micrograms (mcg) of folic acid every day.  If you want to prevent pregnancy, talk to your health care provider about birth control (contraception). Osteoporosis and menopause  Osteoporosis is a disease in which the bones lose minerals and strength with aging. This can result in serious bone fractures. Your risk for osteoporosis can be identified using a bone density scan.  If you are 63 years of age or older, or if you are at risk for osteoporosis and fractures, ask your health care provider if you should be screened.  Ask your health care provider whether you should take a calcium or vitamin D supplement to lower your risk for osteoporosis.  Menopause may have certain physical symptoms and risks.  Hormone replacement therapy may reduce some of these symptoms and risks. Talk to your health care provider about whether hormone replacement therapy is right for you. Follow these instructions at home:  Schedule regular health, dental, and eye exams.  Stay current with your immunizations.  Do not use any tobacco products including cigarettes, chewing tobacco, or electronic cigarettes.  If you are pregnant, do not drink alcohol.  If you are  breastfeeding, limit how much and how often you drink alcohol.  Limit alcohol intake to no more than 1 drink per day for nonpregnant women. One drink equals 12 ounces of beer, 5 ounces of wine, or 1 ounces of hard liquor.  Do not use street drugs.  Do not share needles.  Ask your health care provider for help if you need support or information about quitting drugs.  Tell your health care provider if you often feel depressed.  Tell your health care provider if you have ever been abused or do not feel safe at home. This information is not intended to replace advice given to you by your health care provider. Make sure you discuss any questions you have with your health care provider. Document Released: 07/08/2010 Document Revised: 05/31/2015 Document Reviewed: 09/26/2014 Elsevier Interactive Patient Education  [employer].

## 2017-02-04 NOTE — Assessment & Plan Note (Signed)
TCI spray refilled.

## 2017-02-04 NOTE — Assessment & Plan Note (Signed)
Continue statin, plavix.  

## 2017-02-04 NOTE — Assessment & Plan Note (Signed)
Preventative protocols reviewed and updated unless pt declined. Discussed healthy diet and lifestyle.  

## 2017-02-04 NOTE — Assessment & Plan Note (Signed)
Chronic, stable. Continue pravastatin. 

## 2017-02-04 NOTE — Progress Notes (Signed)
BP 118/68 (BP Location: Left Arm, Patient Position: Sitting, Cuff Size: Normal)   Pulse 83   Temp 98.2 F (36.8 C) (Oral)   Ht 5' 1.25" (1.556 m)   Wt 132 lb 8 oz (60.1 kg)   SpO2 99%   BMI 24.83 kg/m    CC: CPE Subjective:    Patient ID: Marilyn Cook, female    DOB: [birth year], 72 y.o.   MRN: [id number]  HPI: Marilyn Cook is a 72 y.o. female presenting on 02/04/2017 for Annual Exam (Wants to discuss allergy med. Also, wants to discuss oil for ears that Dr. Darnell Level recommended previously.)   Continues working part time at [employer]. Planned retirement this year.  L eye and L rhinorrhea continually runs. Has seen Dr Katy Fitch for this.  Ongoing lower abd discomfort since starting plavix.  Ongoing leg cramps.  Has medicare part A  Preventative: COLONOSCOPY 04/2015 diverticulosis, int hem, rpt 10 yrs (Nandigam) Mammogram 03/2016 - normal. Gets yearly.  Pap smear - Partial hysterectomy for benign reason (fibroids), ovaries remain. Bimanual only done 2015. Denies pelvic pain or pressure.  DEXA Date: 08/2012 spine WNL, hip T -1.2  Flubloc yearly (egg allergy) Pneumovax 07/31/2011, prevnar 01/2015 Tdap 07/2011 zostavax - 11/2015 shingrix - discussed Advanced directives: discussed - thinks HCPOA would be son. Thinks she filled this out. Asked to bring in to update chart. Seat belt use discussed Sunscreen use discussed - allergic to sunscreen. Discussed avoiding sun and wearing wide brimmed hat. No changing moles on skin.  Non smoker Alcohol - none  Married and lives with husband Patrick [place] One son Occupation: works at [employer] at [employer] Activity: no regular exercise, active through rehab Diet: some water, fruits/vegetables daily  Relevant past medical, surgical, family and social history reviewed and updated as indicated. Interim medical history since our last visit reviewed. Allergies and medications reviewed and updated. Outpatient Medications Prior to Visit  Medication Sig Dispense Refill  .  Ascorbic Acid (VITAMIN C PO) Take 1 capsule by mouth daily.    . Calcium Carb-Cholecalciferol (CALCIUM-VITAMIN D) 600-400 MG-UNIT TABS Take 1 tablet by mouth 2 (two) times daily.     . cetirizine (ZYRTEC) 10 MG tablet Take 10 mg by mouth daily.    . Cinnamon 500 MG capsule Take 1,000 mg by mouth 2 (two) times daily.     [name] co-enzyme Q-10 30 MG capsule Take 30 mg by mouth daily.    [name] CRANBERRY PO Take 1 tablet by mouth daily.     . hydrocortisone 2.5 % cream Apply 1 application topically 2 (two) times daily as needed (itching).     . Misc Natural Products (BLACK CHERRY CONCENTRATE PO) Take 1 tablet by mouth daily.     . Potassium Chloride (K-TABS PO) Take 1 tablet by mouth daily.     . Pyridoxine HCl (VITAMIN B-6 PO) Take 1 tablet by mouth daily.    [name] triamcinolone (KENALOG) 0.147 MG/GM topical spray Apply topically 2 (two) times daily as needed. For psoriasis 63 g 1  . vitamin B-12 (CYANOCOBALAMIN) 1000 MCG tablet Take 1,000 mcg by mouth daily.    . clopidogrel (PLAVIX) 75 MG tablet TAKE 1 TABLET (75 MG TOTAL) BY MOUTH DAILY. 30 tablet 6  . KENALOG 0.147 MG/GM topical spray APPLY TOPICALLY 2 (TWO) TIMES DAILY AS NEEDED. FOR PSORIASIS 63 g 1  . pravastatin (PRAVACHOL) 40 MG tablet Take 1 tablet (40 mg total) by mouth daily. 90 tablet 3  . clopidogrel (PLAVIX) 75 MG  tablet Take 1 tablet (75 mg total) by mouth daily. 90 tablet 3   No facility-administered medications prior to visit.      Per HPI unless specifically indicated in ROS section below Review of Systems  Constitutional: Negative for activity change, appetite change, chills, fatigue, fever and unexpected weight change.  HENT: Negative for hearing loss.   Eyes: Negative for visual disturbance.  Respiratory: Negative for cough, chest tightness, shortness of breath and wheezing.   Cardiovascular: Negative for chest pain, palpitations and leg swelling.  Gastrointestinal: Positive for abdominal pain. Negative for abdominal distention,  blood in stool, constipation, diarrhea, nausea and vomiting.  Genitourinary: Negative for difficulty urinating and hematuria.  Musculoskeletal: Negative for arthralgias, myalgias and neck pain.  Skin: Negative for rash.  Neurological: Negative for dizziness, seizures, syncope and headaches.  Hematological: Negative for adenopathy. Bruises/bleeds easily.  Psychiatric/Behavioral: Positive for dysphoric mood (work related stressors, but will let us know if persistent past retirement). The patient is not nervous/anxious.        Objective:    BP 118/68 (BP Location: Left Arm, Patient Position: Sitting, Cuff Size: Normal)   Pulse 83   Temp 98.2 F (36.8 C) (Oral)   Ht 5' 1.25" (1.556 m)   Wt 132 lb 8 oz (60.1 kg)   SpO2 99%   BMI 24.83 kg/m   Wt Readings from Last 3 Encounters:  02/04/17 132 lb 8 oz (60.1 kg)  02/01/16 134 lb 8 oz (61 kg)  11/23/15 134 lb 12 oz (61.1 kg)    Physical Exam  Constitutional: She is oriented to person, place, and time. She appears well-developed and well-nourished. No distress.  HENT:  Head: Normocephalic and atraumatic.  Right Ear: Hearing, tympanic membrane, external ear and ear canal normal.  Left Ear: Hearing, tympanic membrane, external ear and ear canal normal.  Nose: Nose normal.  Mouth/Throat: Uvula is midline, oropharynx is clear and moist and mucous membranes are normal. No oropharyngeal exudate, posterior oropharyngeal edema or posterior oropharyngeal erythema.  Eyes: Conjunctivae and EOM are normal. Pupils are equal, round, and reactive to light. No scleral icterus.  Neck: Normal range of motion. Neck supple. Carotid bruit is not present. No thyromegaly present.  Cardiovascular: Normal rate, regular rhythm, normal heart sounds and intact distal pulses.  No murmur heard. Pulses:      Radial pulses are 2+ on the right side, and 2+ on the left side.  Pulmonary/Chest: Effort normal and breath sounds normal. No respiratory distress. She has no  wheezes. She has no rales.  Abdominal: Soft. Bowel sounds are normal. She exhibits no distension and no mass. There is no tenderness. There is no rebound and no guarding.  Musculoskeletal: Normal range of motion. She exhibits no edema.  Lymphadenopathy:    She has no cervical adenopathy.  Neurological: She is alert and oriented to person, place, and time.  CN grossly intact, station and gait intact  Skin: Skin is warm and dry. No rash noted.  Psychiatric: She has a normal mood and affect. Her behavior is normal. Judgment and thought content normal.  Nursing note and vitals reviewed.  Results for orders placed or performed in visit on 01/30/17  TSH  Result Value Ref Range   TSH 3.86 0.35 - 4.50 uIU/mL  Comprehensive metabolic panel  Result Value Ref Range   Sodium 143 135 - 145 mEq/L   Potassium 3.9 3.5 - 5.1 mEq/L   Chloride 104 96 - 112 mEq/L   CO2 30 19 - 32 mEq/L  Glucose, Bld 122 (H) 70 - 99 mg/dL   BUN 12 6 - 23 mg/dL   Creatinine, Ser 0.87 0.40 - 1.20 mg/dL   Total Bilirubin 0.4 0.2 - 1.2 mg/dL   Alkaline Phosphatase 54 39 - 117 U/L   AST 16 0 - 37 U/L   ALT 11 0 - 35 U/L   Total Protein 7.0 6.0 - 8.3 g/dL   Albumin 4.2 3.5 - 5.2 g/dL   Calcium 8.9 8.4 - 10.5 mg/dL   GFR 67.96 >60.00 mL/min  Lipid panel  Result Value Ref Range   Cholesterol 148 0 - 200 mg/dL   Triglycerides 111.0 0.0 - 149.0 mg/dL   HDL 50.70 >39.00 mg/dL   VLDL 22.2 0.0 - 40.0 mg/dL   LDL Cholesterol 75 0 - 99 mg/dL   Total CHOL/HDL Ratio 3    NonHDL 97.45    Depression screen [facility] 2/9 02/04/2017 08/04/2012  Decreased Interest 2 0  Down, Depressed, Hopeless 2 0  PHQ - 2 Score 4 0  Altered sleeping 1 -  Tired, decreased energy 3 -  Change in appetite 2 -  Feeling bad or failure about yourself  2 -  Trouble concentrating 3 -  Moving slowly or fidgety/restless 1 -  Suicidal thoughts 0 -  PHQ-9 Score 16 -       Assessment & Plan:   Problem List Items Addressed This Visit    Advanced care  planning/counseling discussion    Advanced directives: discussed - thinks HCPOA would be son. Thinks she filled this out. Asked to bring in to update chart.      Healthcare maintenance - Primary    Preventative protocols reviewed and updated unless pt declined. Discussed healthy diet and lifestyle.       HLD (hyperlipidemia)    Chronic, stable. Continue pravastatin.      Relevant Medications   pravastatin (PRAVACHOL) 40 MG tablet   Scalp psoriasis    TCI spray refilled.      TIA (transient ischemic attack)    Continue statin, plavix.       Relevant Medications   pravastatin (PRAVACHOL) 40 MG tablet       Follow up plan: Return in about 1 year (around 02/04/2018) for annual exam, prior fasting for blood work.  Ria Bush, MD

## 2017-02-05 ENCOUNTER — Encounter: Payer: 59 | Admitting: Family Medicine

## 2017-02-06 MED ORDER — TRIAMCINOLONE ACETONIDE 0.147 MG/GM EX AERS: INHALATION_SPRAY | Freq: Two times a day (BID) | CUTANEOUS | 1 refills | Status: DC | PRN

## 2017-02-06 MED FILL — TRIAMCINOLONE ACETONIDE 0.1: 0.147 | 30 days supply | Qty: 63 | Fill #0

## 2017-02-06 NOTE — Addendum Note (Signed)
Addended by: Ria Bush on: 02/06/2017 07:28 AM   Modules accepted: Orders

## 2017-02-11 MED FILL — ACYCLOVIR 200 MG CAPSULE: 200 | 7 days supply | Qty: 14 | Fill #0

## 2017-02-11 MED FILL — ACYCLOVIR 5 % OINT: 5 | 30 days supply | Qty: 15 | Fill #0

## 2017-02-24 MED FILL — CLOPIDOGREL 75 MG TABLET: 75 | 90 days supply | Qty: 90 | Fill #0 | Status: TO

## 2017-03-17 DIAGNOSIS — H524 Presbyopia: Secondary | ICD-10-CM | POA: Diagnosis not present

## 2017-03-17 DIAGNOSIS — H2513 Age-related nuclear cataract, bilateral: Secondary | ICD-10-CM | POA: Diagnosis not present

## 2017-03-17 DIAGNOSIS — H5203 Hypermetropia, bilateral: Secondary | ICD-10-CM | POA: Diagnosis not present

## 2017-03-17 DIAGNOSIS — H16223 Keratoconjunctivitis sicca, not specified as Sjogren's, bilateral: Secondary | ICD-10-CM | POA: Diagnosis not present

## 2017-03-17 DIAGNOSIS — H52223 Regular astigmatism, bilateral: Secondary | ICD-10-CM | POA: Diagnosis not present

## 2017-03-23 ENCOUNTER — Telehealth: Payer: Self-pay

## 2017-03-23 NOTE — Telephone Encounter (Signed)
Copied from [place] #[id number]. Topic: Inquiry >> Mar 23, 2017  4:18 PM Margot Ables wrote: Reason for CRM: pt asking if we have the shingrex in yet. Please advise.

## 2017-03-23 NOTE — Telephone Encounter (Signed)
I spoke with pt and advised we do not have shingrix vaccine yet and pt is checking with some of the different pharmacies to see if the shingrix vaccine is available and pt will cb with name of pharmacy so shingrix rx can be sent.

## 2017-03-31 MED FILL — TRIAMCINOLONE ACETONIDE 0.1: 0.147 | 30 days supply | Qty: 63 | Fill #1

## 2017-03-31 MED FILL — PRAVASTATIN NA 40 MG TAB: 40 | 90 days supply | Qty: 90 | Fill #0 | Status: TO

## 2017-04-08 DIAGNOSIS — Z1231 Encounter for screening mammogram for malignant neoplasm of breast: Secondary | ICD-10-CM | POA: Diagnosis not present

## 2017-08-02 IMAGING — MR MR SHOULDER*R* W/O CM
4 of 5 series · 19 of 40 positions shown · non-contrast
Comparison: None.

CLINICAL DATA: Chronic right shoulder pain and aching. No known
injury. Initial encounter.

EXAM:
MRI OF THE RIGHT SHOULDER WITHOUT CONTRAST
TECHNIQUE: Multiplanar, multisequence MR imaging of the shoulder was performed.
No intravenous contrast was administered.

[Series 3: T2 fat-sat · axial · 4.0mm · 0.29mm/px · z∈[-56,+33]mm · 5 of 22 slices shown (1 of 3)]
[im 1/22]
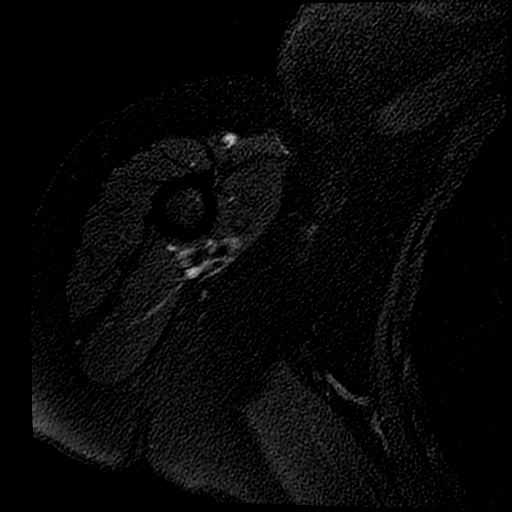
[im 3/22]
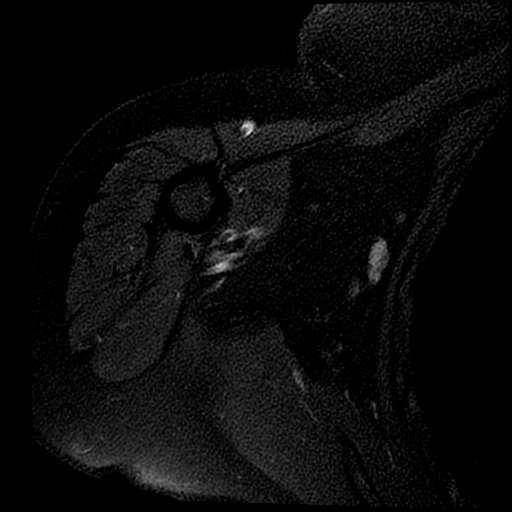
[im 8/22]
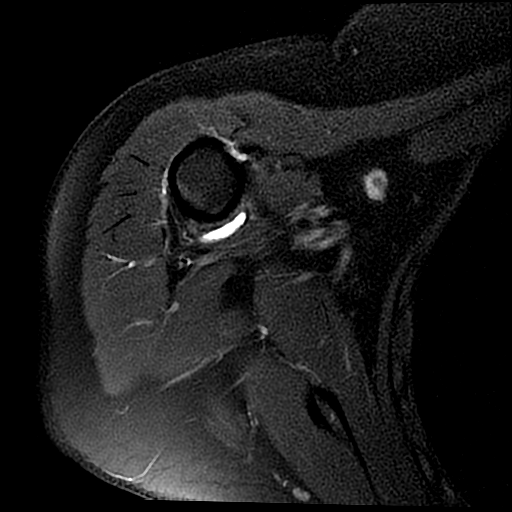
[im 12/22]
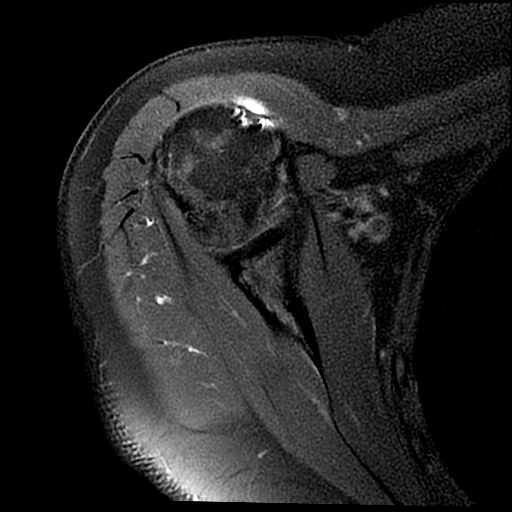
[im 19/22]
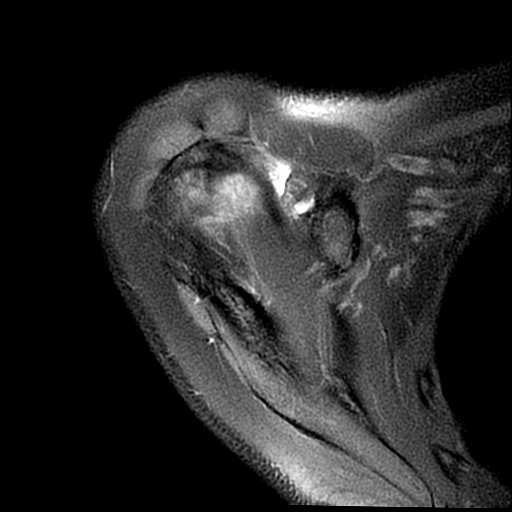

[Series 4: T2 fat-sat · sagittal · 4.0mm · 0.29mm/px · 3 of 18 slices shown (2 of 3)]
[im 3/18]
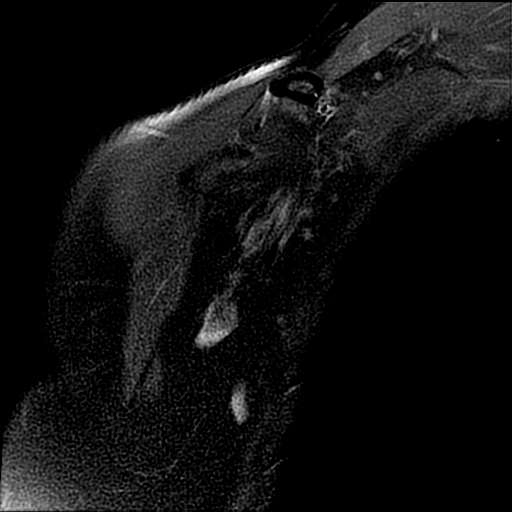
[im 10/18]
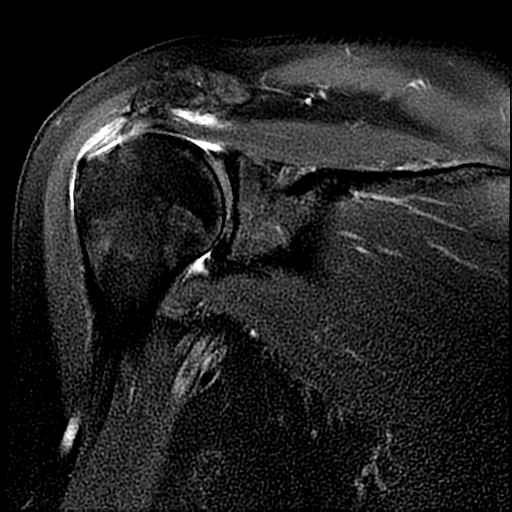
[im 15/18]
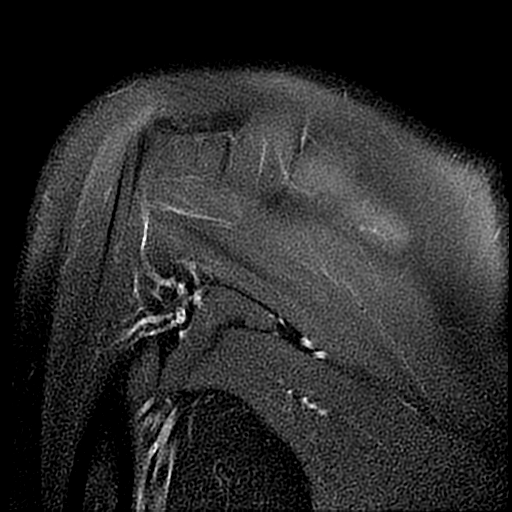

[Series 5: PD · sagittal · 4.0mm · 0.29mm/px · 8 of 18 slices shown]
[im 1/18]
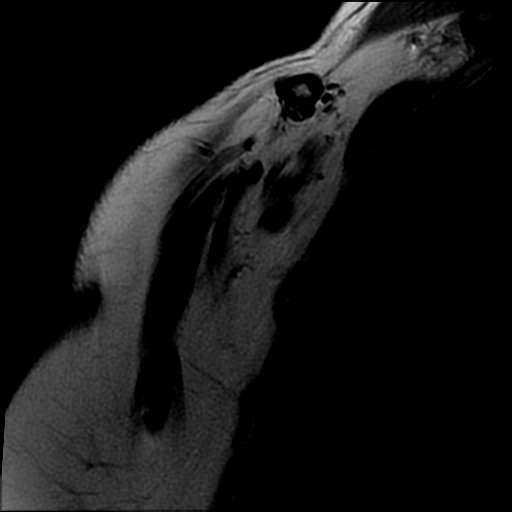
[im 3/18]
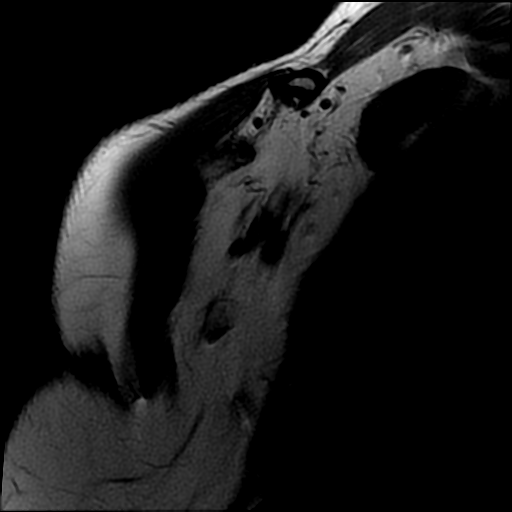
[im 5/18]
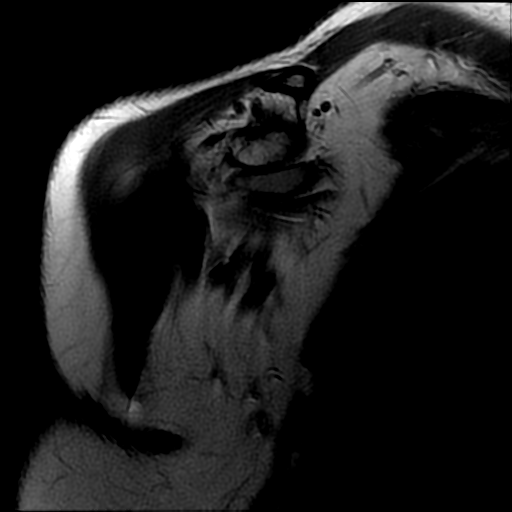
[im 8/18]
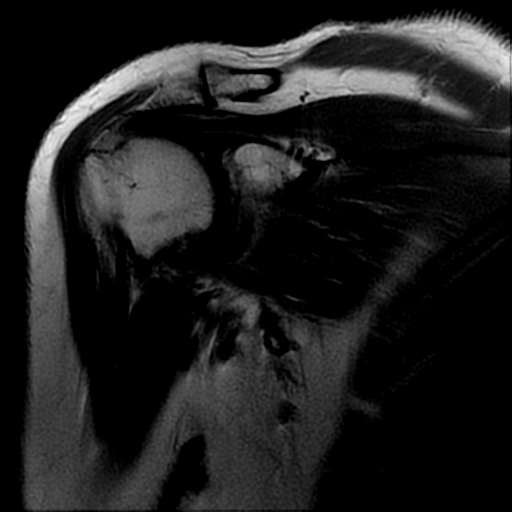
[im 10/18]
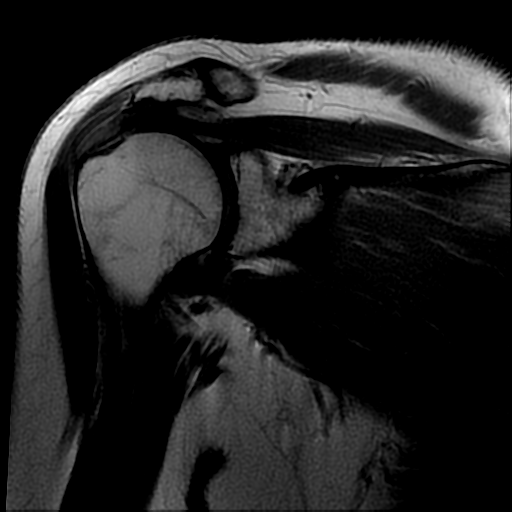
[im 13/18]
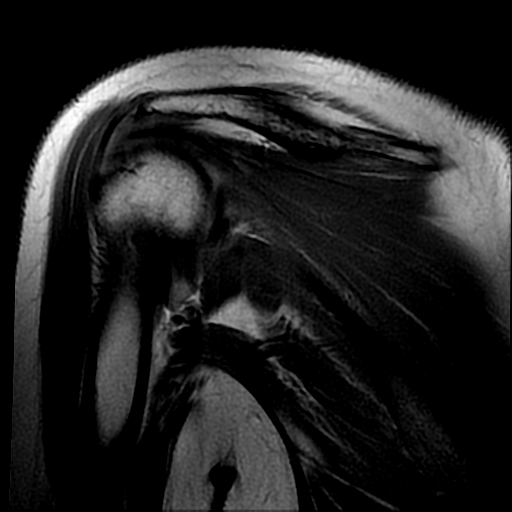
[im 15/18]
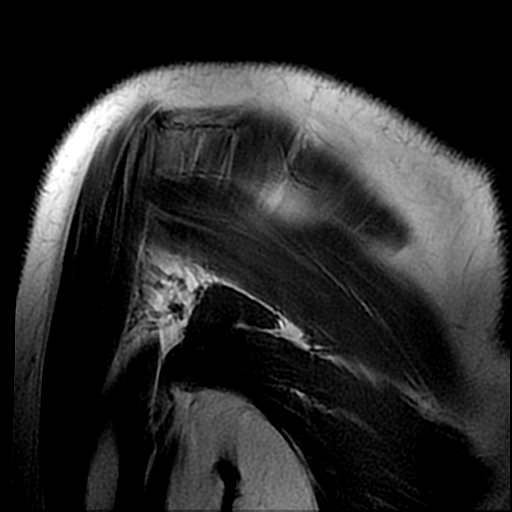
[im 18/18]
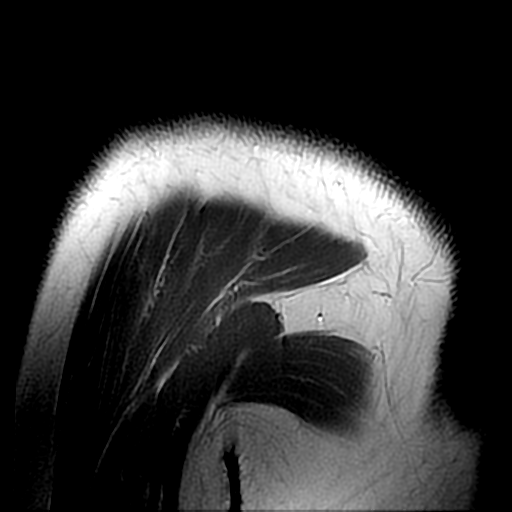

[Series 6: T2 fat-sat · coronal · 4.0mm · 0.29mm/px · 3 of 16 slices shown (3 of 3)]
[im 3/16]
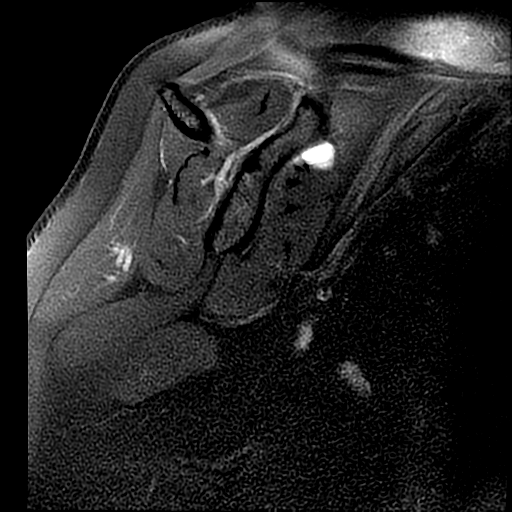
[im 8/16]
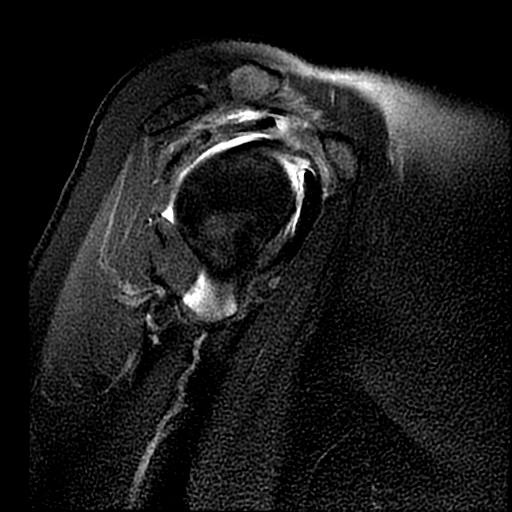
[im 13/16]
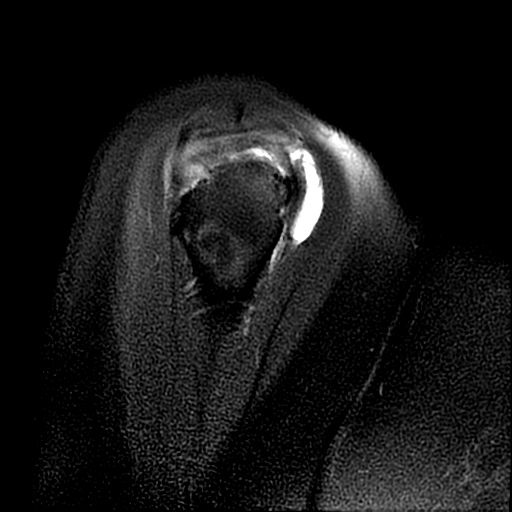

[19 of 40 positions shown; findings below may reference images not displayed]

FINDINGS: Rotator cuff: There is rotator cuff tendinopathy with a partial
width, full-thickness tear of the anterior and far lateral
supraspinatus measuring 1 cm from front to back. Retraction is mild
at 1 cm. The infraspinatus and subscapularis are intact.

Muscles:  Normal in appearance without atrophy or focal lesion.

Biceps long head:  Intact.

Acromioclavicular Joint: Mild to moderate degenerative change is
seen.

Glenohumeral Joint: Unremarkable.

Labrum:  Intact.

Bones: The acromion is type 2 with subacromial spurring. No fracture
or worrisome marrow lesion is identified. Fluid is present in the
subacromial/subdeltoid bursa.
IMPRESSION: Rotator cuff tendinopathy with a partial width, full-thickness tear
of the supraspinatus measuring approximately 1 cm from front to
back. Retraction is mild at 1 cm and there is no atrophy.

Type 2 acromion with subacromial spurring. Mild to moderate
acromioclavicular osteoarthritis is also seen.

Subacromial/subdeltoid fluid consistent with bursitis.

## 2017-08-14 DIAGNOSIS — L308 Other specified dermatitis: Secondary | ICD-10-CM | POA: Diagnosis not present

## 2017-08-14 DIAGNOSIS — L570 Actinic keratosis: Secondary | ICD-10-CM | POA: Diagnosis not present

## 2017-08-14 DIAGNOSIS — D1801 Hemangioma of skin and subcutaneous tissue: Secondary | ICD-10-CM | POA: Diagnosis not present

## 2017-08-14 DIAGNOSIS — D692 Other nonthrombocytopenic purpura: Secondary | ICD-10-CM | POA: Diagnosis not present

## 2017-08-14 DIAGNOSIS — L821 Other seborrheic keratosis: Secondary | ICD-10-CM | POA: Diagnosis not present

## 2017-08-14 DIAGNOSIS — L814 Other melanin hyperpigmentation: Secondary | ICD-10-CM | POA: Diagnosis not present

## 2017-08-14 DIAGNOSIS — D2272 Melanocytic nevi of left lower limb, including hip: Secondary | ICD-10-CM | POA: Diagnosis not present

## 2017-08-14 DIAGNOSIS — L430 Hypertrophic lichen planus: Secondary | ICD-10-CM | POA: Diagnosis not present

## 2017-08-14 DIAGNOSIS — D485 Neoplasm of uncertain behavior of skin: Secondary | ICD-10-CM | POA: Diagnosis not present

## 2017-08-29 IMAGING — CR DG CHEST 2V
2 series · 2 of 2 positions shown · non-contrast
Comparison: None.

CLINICAL DATA: Preop clearance

EXAM:
CHEST  2 VIEW

[view not recorded (1 of 2)]
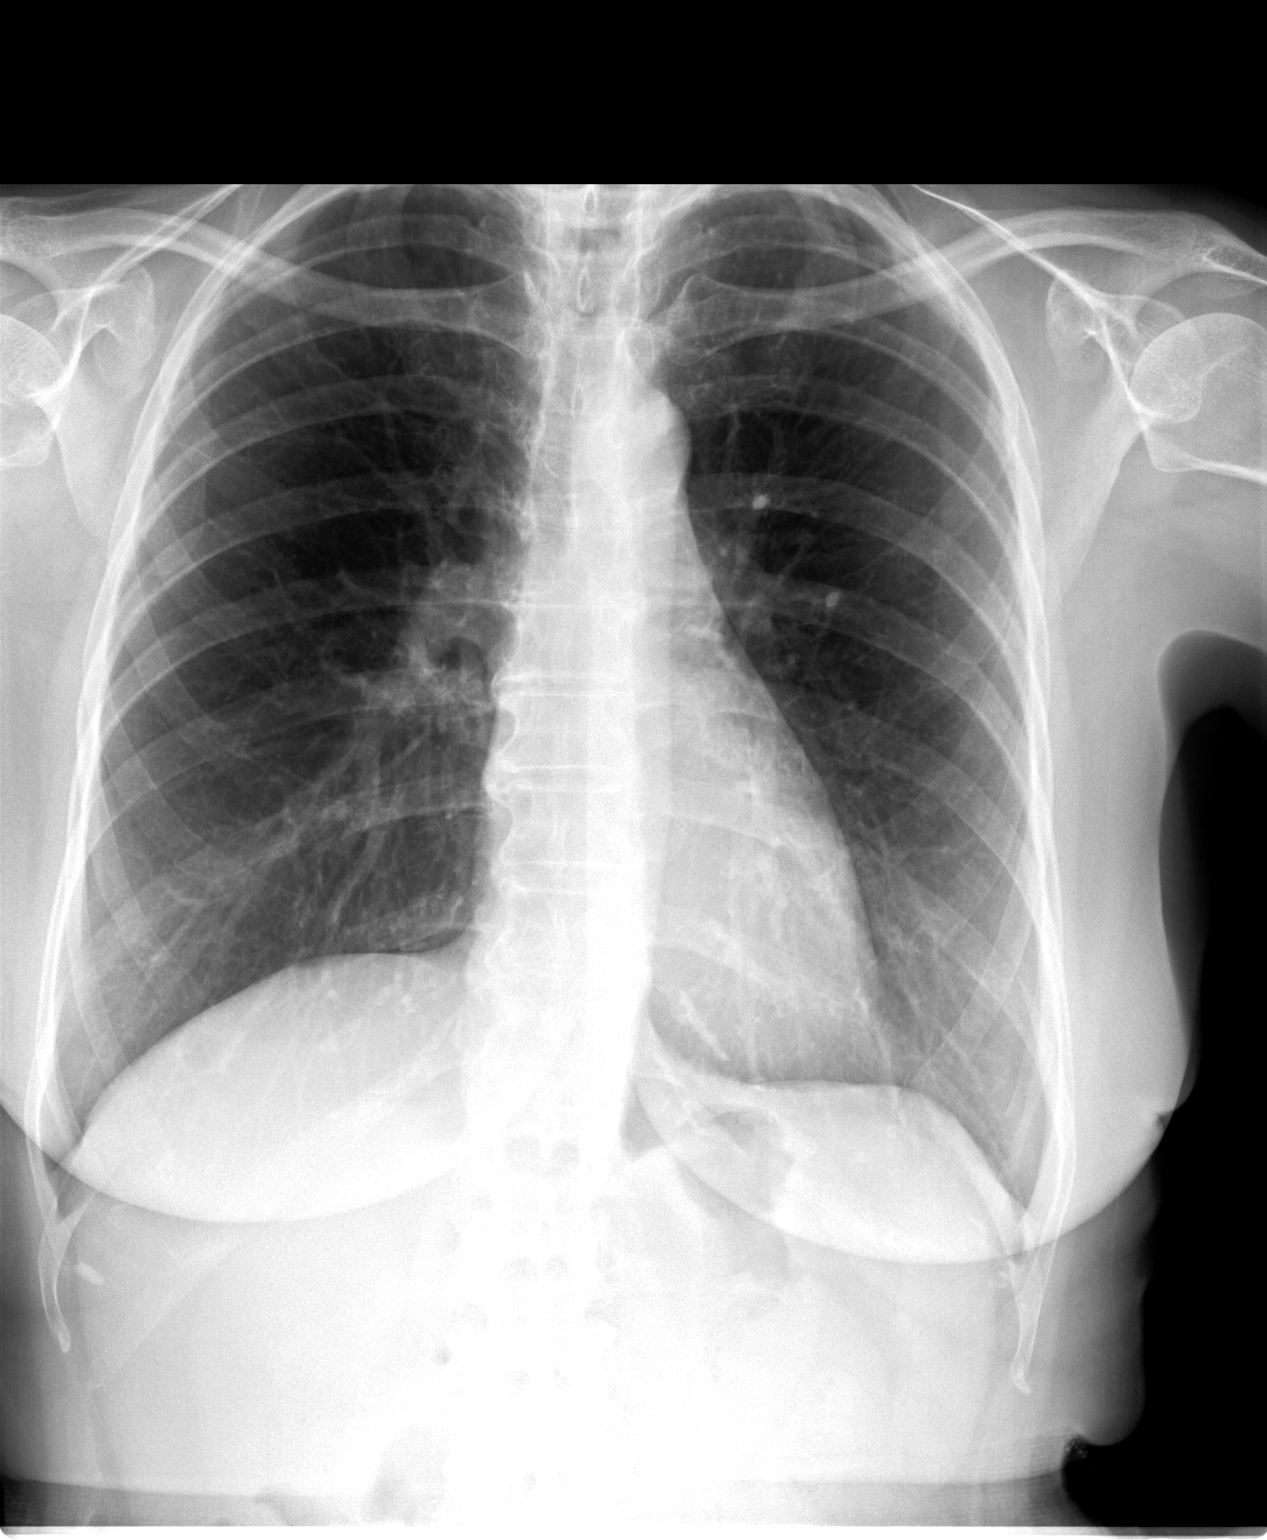

[view not recorded (2 of 2)]
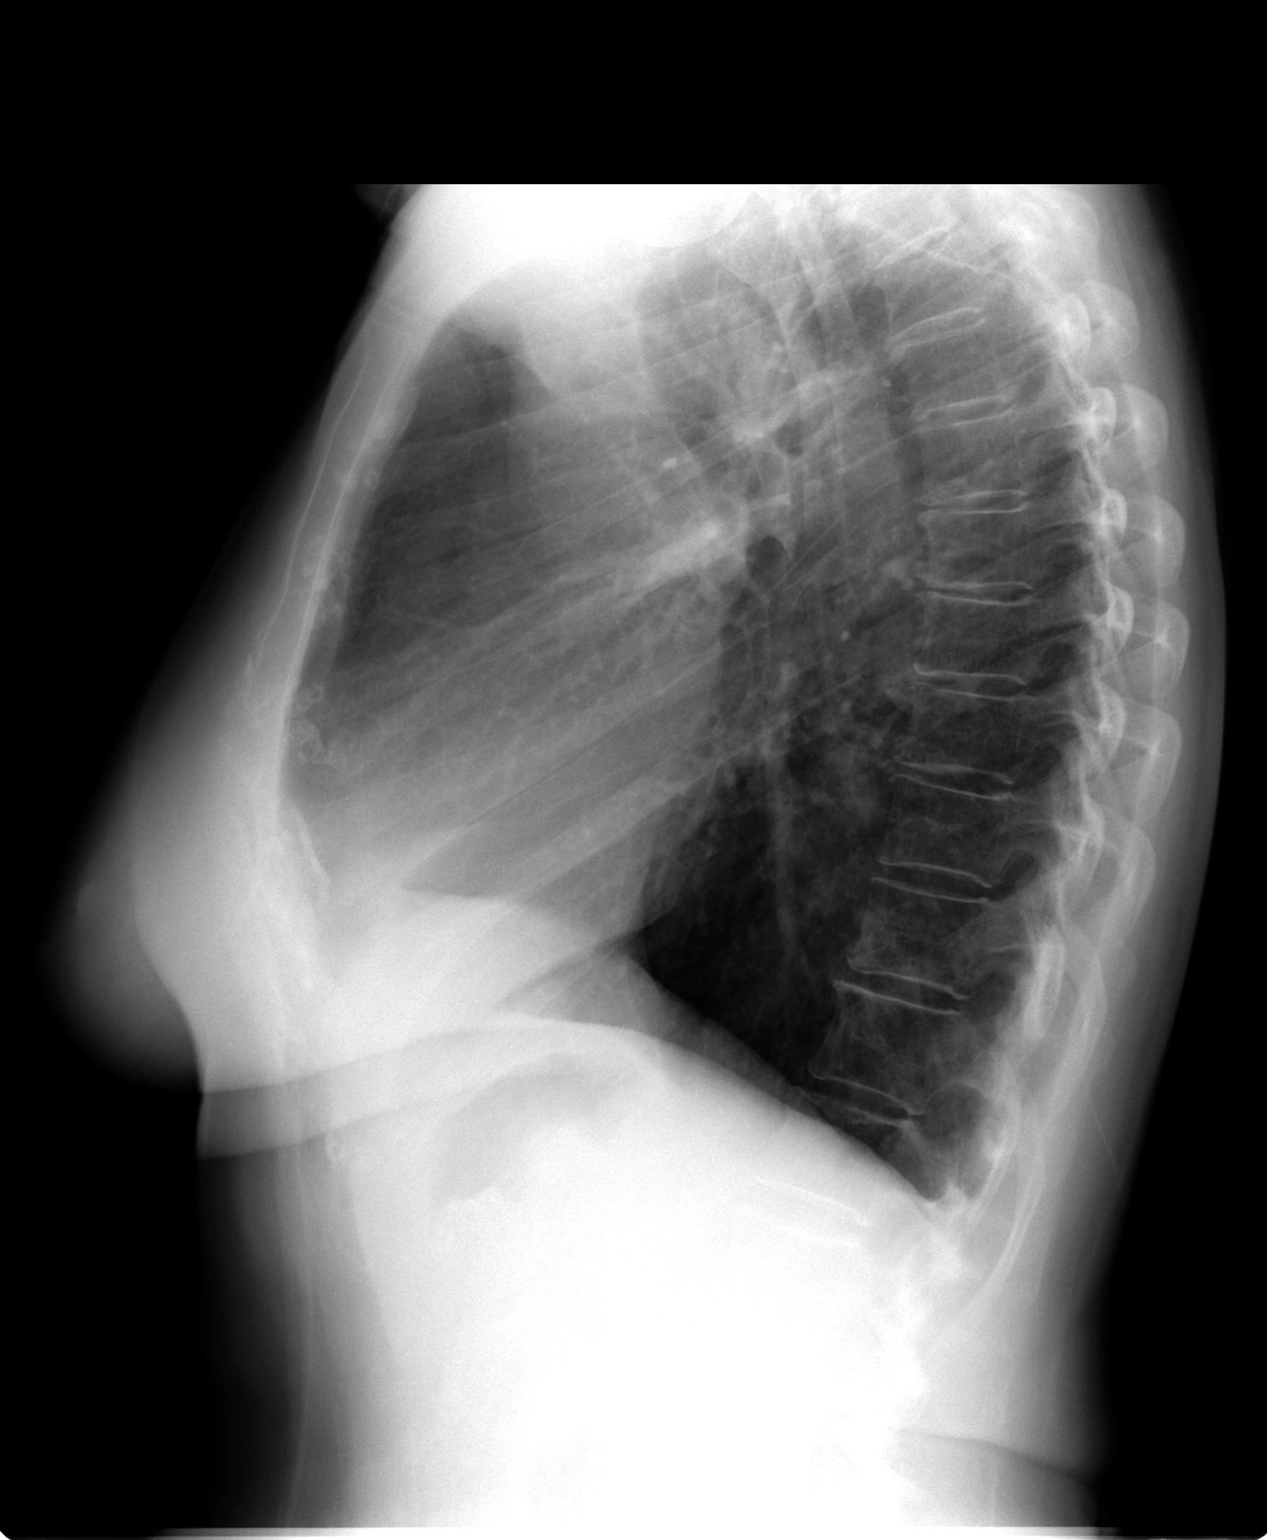

[2 of 2 positions shown; findings below may reference images not displayed]

FINDINGS: The heart size and mediastinal contours are within normal limits.
Both lungs are clear. The visualized skeletal structures are
unremarkable. Mild apical scarring bilaterally.
IMPRESSION: No active cardiopulmonary disease.

## 2017-09-22 DIAGNOSIS — R69 Illness, unspecified: Secondary | ICD-10-CM | POA: Diagnosis not present

## 2017-10-04 DIAGNOSIS — R69 Illness, unspecified: Secondary | ICD-10-CM | POA: Diagnosis not present

## 2017-11-04 ENCOUNTER — Telehealth: Payer: Self-pay | Admitting: *Deleted

## 2017-11-04 NOTE — Telephone Encounter (Signed)
Ok to continue with dose tonight.

## 2017-11-04 NOTE — Telephone Encounter (Signed)
Pt contacted office and states she missed her dose of plavix last night and wanted to confirm that it would be ok, for her to just continue with her normal dose tonight. pls advise

## 2017-11-04 NOTE — Telephone Encounter (Signed)
Left message for Ms. Pavone to continue with original dose tonight per Dr. Darnell Level.

## 2018-02-01 ENCOUNTER — Other Ambulatory Visit: Payer: Self-pay | Admitting: Family Medicine

## 2018-02-01 DIAGNOSIS — R002 Palpitations: Secondary | ICD-10-CM

## 2018-02-01 DIAGNOSIS — E785 Hyperlipidemia, unspecified: Secondary | ICD-10-CM

## 2018-02-02 ENCOUNTER — Other Ambulatory Visit (INDEPENDENT_AMBULATORY_CARE_PROVIDER_SITE_OTHER): Payer: Medicare HMO

## 2018-02-02 DIAGNOSIS — R002 Palpitations: Secondary | ICD-10-CM | POA: Diagnosis not present

## 2018-02-02 DIAGNOSIS — E785 Hyperlipidemia, unspecified: Secondary | ICD-10-CM

## 2018-02-02 LAB — COMPREHENSIVE METABOLIC PANEL
ALT: 13 U/L (ref 0–35)
AST: 17 U/L (ref 0–37)
Albumin: 4.4 g/dL (ref 3.5–5.2)
Alkaline Phosphatase: 56 U/L (ref 39–117)
BUN: 11 mg/dL (ref 6–23)
CALCIUM: 9.3 mg/dL (ref 8.4–10.5)
CO2: 31 mEq/L (ref 19–32)
Chloride: 105 mEq/L (ref 96–112)
Creatinine, Ser: 0.74 mg/dL (ref 0.40–1.20)
GFR: 76.86 mL/min (ref >60.00)
Glucose, Bld: 104 mg/dL — ABNORMAL HIGH (ref 70–99)
Potassium: 3.7 mEq/L (ref 3.5–5.1)
Sodium: 142 mEq/L (ref 135–145)
Total Bilirubin: 0.5 mg/dL (ref 0.2–1.2)
Total Protein: 7.3 g/dL (ref 6.0–8.3)

## 2018-02-02 LAB — CBC WITH DIFFERENTIAL/PLATELET
BASOS ABS: 0 10*3/uL (ref 0.0–0.1)
Basophils Relative: 0.6 % (ref 0.0–3.0)
Eosinophils Absolute: 0.1 10*3/uL (ref 0.0–0.7)
Eosinophils Relative: 2.1 % (ref 0.0–5.0)
HCT: 42.3 % (ref 36.0–46.0)
Hemoglobin: 14.4 g/dL (ref 12.0–15.0)
LYMPHS ABS: 1.3 10*3/uL (ref 0.7–4.0)
Lymphocytes Relative: 29.2 % (ref 12.0–46.0)
MCHC: 34.2 g/dL (ref 30.0–36.0)
MCV: 97.1 fl (ref 78.0–100.0)
Monocytes Absolute: 0.5 10*3/uL (ref 0.1–1.0)
Monocytes Relative: 10.9 % (ref 3.0–12.0)
Neutro Abs: 2.5 10*3/uL (ref 1.4–7.7)
Neutrophils Relative %: 57.2 % (ref 43.0–77.0)
Platelets: 221 10*3/uL (ref 150.0–400.0)
RBC: 4.35 Mil/uL (ref 3.87–5.11)
RDW: 12.8 % (ref 11.5–15.5)
WBC: 4.4 10*3/uL (ref 4.0–10.5)

## 2018-02-02 LAB — LIPID PANEL
Cholesterol: 165 mg/dL (ref 0–200)
HDL: 53 mg/dL (ref >39.00)
LDL Cholesterol: 95 mg/dL (ref 0–99)
NonHDL: 111.65
Total CHOL/HDL Ratio: 3
Triglycerides: 81 mg/dL (ref 0.0–149.0)
VLDL: 16.2 mg/dL (ref 0.0–40.0)

## 2018-02-02 LAB — TSH: TSH: 4.21 u[IU]/mL (ref 0.35–4.50)

## 2018-02-04 ENCOUNTER — Encounter: Payer: Self-pay | Admitting: Family Medicine

## 2018-02-04 NOTE — Progress Notes (Signed)
BP 134/84 (BP Location: Left Arm, Patient Position: Sitting, Cuff Size: Normal)   Pulse 90   Temp 98.2 F (36.8 C) (Oral)   Ht 5' 1.5" (1.562 m)   Wt 144 lb 8 oz (65.5 kg)   SpO2 97%   BMI 26.86 kg/m    CC: Welcome to medicare visit Subjective:    Patient ID: Marilyn Cook, female    DOB: 02-24-1945, 73 y.o.   MRN: [id number]  HPI: AVIA MERKLEY is a 73 y.o. female presenting on 02/05/2018 for Medicare Wellness   New medicare part B patient.  Retired March 2019.  Hearing Screening   125Hz  250Hz  500Hz  1000Hz  2000Hz  3000Hz  4000Hz  6000Hz  8000Hz   Right ear:   25 40 0  40    Left ear:   25 40 0  0    Vision Screening Comments: Last eye exam, Jun/July 2019    Office Visit from 02/05/2018 in [place] at [facility] Total Score  0    1 fall this past year - slipped at restaurant on [place].   0/3 recall.  Preventative: COLONOSCOPY 04/2015 diverticulosis, int hem, rpt 10 yrs (Nandigam) Mammogram 03/2016- normal. overdue. Pap smear - Partial hysterectomy for benign reason (fibroids), ovaries remain. Bimanual only done 2015. Denies pelvic pain or pressure. DEXA Date: 08/2012 spine WNL, hip T -1.2. Takes calcium daily. Flubloc yearly (egg allergy)  Pneumovax 07/31/2011, prevnar1/2017 Tdap 07/2011 zostavax -11/2015 shingrix - completed series 07/2017, 11/2017 Advanced directives: discussed - thinks HCPOA would be son. Thinks she filled this out. Asked to bring in to update chart. Seat belt use discussed Sunscreen use discussed - allergic to sunscreen. Discussed avoiding sun and wearing wide brimmed hat. No changing moles on skin.  Non smoker Alcohol - none Dentist q6 mo Eye exam yearly - [facility] - sees halos at night.  Married and lives with husbandWayne One son Occupation: worked at [employer] at [employer] - retired 03/2017 Activity: no regular exercise, active through rehab Diet: some water, fruits/vegetables daily     Relevant past medical,  surgical, family and social history reviewed and updated as indicated. Interim medical history since our last visit reviewed. Allergies and medications reviewed and updated. Outpatient Medications Prior to Visit  Medication Sig Dispense Refill  . Ascorbic Acid (VITAMIN C PO) Take 1 capsule by mouth daily.    . Calcium Carb-Cholecalciferol (CALCIUM-VITAMIN D) 600-400 MG-UNIT TABS Take 1 tablet by mouth 2 (two) times daily.     . cetirizine (ZYRTEC) 10 MG tablet Take 10 mg by mouth daily.    . Cinnamon 500 MG capsule Take 1,000 mg by mouth 2 (two) times daily.     [name] co-enzyme Q-10 30 MG capsule Take 30 mg by mouth daily.    [name] CRANBERRY PO Take 1 tablet by mouth daily.     . hydrocortisone 2.5 % cream Apply 1 application topically 2 (two) times daily as needed (itching).     . Misc Natural Products (BLACK CHERRY CONCENTRATE PO) Take 1 tablet by mouth daily.     . Potassium Chloride (K-TABS PO) Take 1 tablet by mouth daily.     . Pyridoxine HCl (VITAMIN B-6 PO) Take 1 tablet by mouth daily.    [name] triamcinolone (KENALOG) 0.147 MG/GM topical spray Apply topically 2 (two) times daily as needed. For psoriasis 63 g 1  . vitamin B-12 (CYANOCOBALAMIN) 1000 MCG tablet Take 1,000 mcg by mouth daily.    . clopidogrel (PLAVIX) 75 MG  tablet Take 1 tablet (75 mg total) by mouth daily. 30 tablet 11  . pravastatin (PRAVACHOL) 40 MG tablet Take 1 tablet (40 mg total) by mouth daily. 90 tablet 3   No facility-administered medications prior to visit.      Per HPI unless specifically indicated in ROS section below Review of Systems  Constitutional: Negative for activity change, appetite change, chills, fatigue, fever and unexpected weight change.  HENT: Negative for hearing loss.   Eyes: Negative for visual disturbance.  Respiratory: Negative for cough, chest tightness, shortness of breath and wheezing.   Cardiovascular: Negative for chest pain, palpitations and leg swelling.  Gastrointestinal: Negative for  abdominal distention, abdominal pain, blood in stool, constipation, diarrhea, nausea and vomiting.  Genitourinary: Negative for difficulty urinating and hematuria.  Musculoskeletal: Negative for arthralgias, myalgias and neck pain.  Skin: Negative for rash.  Neurological: Negative for dizziness, seizures, syncope and headaches.  Hematological: Negative for adenopathy. Does not bruise/bleed easily.  Psychiatric/Behavioral: Negative for dysphoric mood. The patient is not nervous/anxious.    Objective:    BP 134/84 (BP Location: Left Arm, Patient Position: Sitting, Cuff Size: Normal)   Pulse 90   Temp 98.2 F (36.8 C) (Oral)   Ht 5' 1.5" (1.562 m)   Wt 144 lb 8 oz (65.5 kg)   SpO2 97%   BMI 26.86 kg/m   Wt Readings from Last 3 Encounters:  02/05/18 144 lb 8 oz (65.5 kg)  02/04/17 132 lb 8 oz (60.1 kg)  02/01/16 134 lb 8 oz (61 kg)    Physical Exam Vitals signs and nursing note reviewed.  Constitutional:      General: She is not in acute distress.    Appearance: Normal appearance. She is well-developed.  HENT:     Head: Normocephalic and atraumatic.     Right Ear: Hearing, tympanic membrane, ear canal and external ear normal.     Left Ear: Hearing, tympanic membrane, ear canal and external ear normal.     Nose: Nose normal.     Mouth/Throat:     Mouth: Mucous membranes are moist.     Pharynx: Uvula midline. No oropharyngeal exudate or posterior oropharyngeal erythema.  Eyes:     General: No scleral icterus.    Conjunctiva/sclera: Conjunctivae normal.     Pupils: Pupils are equal, round, and reactive to light.  Neck:     Musculoskeletal: Normal range of motion and neck supple.  Cardiovascular:     Rate and Rhythm: Normal rate and regular rhythm.     Pulses:          Radial pulses are 2+ on the right side and 2+ on the left side.     Heart sounds: Normal heart sounds. No murmur.  Pulmonary:     Effort: Pulmonary effort is normal. No respiratory distress.     Breath sounds:  Normal breath sounds. No wheezing or rales.  Abdominal:     General: Bowel sounds are normal. There is no distension.     Palpations: Abdomen is soft. There is no mass.     Tenderness: There is no abdominal tenderness. There is no guarding or rebound.  Musculoskeletal: Normal range of motion.  Lymphadenopathy:     Cervical: No cervical adenopathy.  Skin:    General: Skin is warm and dry.     Findings: No rash.  Neurological:     Mental Status: She is alert and oriented to person, place, and time.     Comments: CN grossly intact,  station and gait intact Recall 0/3, 0/3 with cue Calculation 5/5 serial 3s   Psychiatric:        Mood and Affect: Mood normal.        Behavior: Behavior normal.        Thought Content: Thought content normal.        Judgment: Judgment normal.       Results for orders placed or performed in visit on 02/02/18  CBC with Differential/Platelet  Result Value Ref Range   WBC 4.4 4.0 - 10.5 K/uL   RBC 4.35 3.87 - 5.11 Mil/uL   Hemoglobin 14.4 12.0 - 15.0 g/dL   HCT 42.3 36.0 - 46.0 %   MCV 97.1 78.0 - 100.0 fl   MCHC 34.2 30.0 - 36.0 g/dL   RDW 12.8 11.5 - 15.5 %   Platelets 221.0 150.0 - 400.0 K/uL   Neutrophils Relative % 57.2 43.0 - 77.0 %   Lymphocytes Relative 29.2 12.0 - 46.0 %   Monocytes Relative 10.9 3.0 - 12.0 %   Eosinophils Relative 2.1 0.0 - 5.0 %   Basophils Relative 0.6 0.0 - 3.0 %   Neutro Abs 2.5 1.4 - 7.7 K/uL   Lymphs Abs 1.3 0.7 - 4.0 K/uL   Monocytes Absolute 0.5 0.1 - 1.0 K/uL   Eosinophils Absolute 0.1 0.0 - 0.7 K/uL   Basophils Absolute 0.0 0.0 - 0.1 K/uL  TSH  Result Value Ref Range   TSH 4.21 0.35 - 4.50 uIU/mL  Comprehensive metabolic panel  Result Value Ref Range   Sodium 142 135 - 145 mEq/L   Potassium 3.7 3.5 - 5.1 mEq/L   Chloride 105 96 - 112 mEq/L   CO2 31 19 - 32 mEq/L   Glucose, Bld 104 (H) 70 - 99 mg/dL   BUN 11 6 - 23 mg/dL   Creatinine, Ser 0.74 0.40 - 1.20 mg/dL   Total Bilirubin 0.5 0.2 - 1.2 mg/dL    Alkaline Phosphatase 56 39 - 117 U/L   AST 17 0 - 37 U/L   ALT 13 0 - 35 U/L   Total Protein 7.3 6.0 - 8.3 g/dL   Albumin 4.4 3.5 - 5.2 g/dL   Calcium 9.3 8.4 - 10.5 mg/dL   GFR 76.86 >60.00 mL/min  Lipid panel  Result Value Ref Range   Cholesterol 165 0 - 200 mg/dL   Triglycerides 81.0 0.0 - 149.0 mg/dL   HDL 53.00 >39.00 mg/dL   VLDL 16.2 0.0 - 40.0 mg/dL   LDL Cholesterol 95 0 - 99 mg/dL   Total CHOL/HDL Ratio 3    NonHDL 111.65    EKG - NSR rate 75, normal axis, intervals, no acute ST/T changes, good R wave progression Assessment & Plan:   Problem List Items Addressed This Visit    Welcome to Medicare preventive visit - Primary    I have personally reviewed the Medicare Annual Wellness questionnaire and have noted 1. The patient's medical and social history 2. Their use of alcohol, tobacco or illicit drugs 3. Their current medications and supplements 4. The patient's functional ability including ADL's, fall risks, home safety risks and hearing or visual impairment. Cognitive function has been assessed and addressed as indicated.  5. Diet and physical activity 6. Evidence for depression or mood disorders The patients weight, height, BMI have been recorded in the chart. I have made referrals, counseling and provided education to the patient based on review of the above and I have provided the pt with a written  personalized care plan for preventive services. Provider list updated.. See scanned questionairre as needed for further documentation. Reviewed preventative protocols and updated unless pt declined.       Relevant Orders   EKG 12-Lead (Completed)   TIA (transient ischemic attack)    Continue plavix, statin.      Relevant Medications   pravastatin (PRAVACHOL) 40 MG tablet   HLD (hyperlipidemia)    Chronic, stable on pravastatin - continue. The 10-year ASCVD risk score Mikey Bussing DC Brooke Bonito., et al., 2013) is: 13.7%   Values used to calculate the score:     Age: 4 years      Sex: Female     Is Non-Hispanic African American: No     Diabetic: No     Tobacco smoker: No     Systolic Blood Pressure: 342 mmHg     Is BP treated: No     HDL Cholesterol: 53 mg/dL     Total Cholesterol: 165 mg/dL       Relevant Medications   pravastatin (PRAVACHOL) 40 MG tablet   Advanced care planning/counseling discussion    Advanced directives: discussed - thinks HCPOA would be son. Thinks she filled this out. Asked to bring in to update chart.          Meds ordered this encounter  Medications  . pravastatin (PRAVACHOL) 40 MG tablet    Sig: Take 1 tablet (40 mg total) by mouth daily.    Dispense:  90 tablet    Refill:  3  . DISCONTD: clopidogrel (PLAVIX) 75 MG tablet    Sig: Take 1 tablet (75 mg total) by mouth daily.    Dispense:  30 tablet    Refill:  11  . clopidogrel (PLAVIX) 75 MG tablet    Sig: Take 1 tablet (75 mg total) by mouth daily.    Dispense:  90 tablet    Refill:  3   Orders Placed This Encounter  Procedures  . EKG 12-Lead    Follow up plan: Return in about 1 year (around 02/06/2019).  Ria Bush, MD

## 2018-02-05 ENCOUNTER — Encounter: Payer: Self-pay | Admitting: Family Medicine

## 2018-02-05 ENCOUNTER — Ambulatory Visit (INDEPENDENT_AMBULATORY_CARE_PROVIDER_SITE_OTHER): Payer: Medicare HMO | Admitting: Family Medicine

## 2018-02-05 VITALS — BP 134/84 | HR 90 | Temp 98.2°F | Ht 61.5 in | Wt 144.5 lb

## 2018-02-05 DIAGNOSIS — Z0000 Encounter for general adult medical examination without abnormal findings: Secondary | ICD-10-CM | POA: Insufficient documentation

## 2018-02-05 DIAGNOSIS — G459 Transient cerebral ischemic attack, unspecified: Secondary | ICD-10-CM

## 2018-02-05 DIAGNOSIS — E785 Hyperlipidemia, unspecified: Secondary | ICD-10-CM

## 2018-02-05 DIAGNOSIS — Z7189 Other specified counseling: Secondary | ICD-10-CM

## 2018-02-05 MED ORDER — CLOPIDOGREL BISULFATE 75 MG PO TABS: 75.0000 mg | ORAL_TABLET | Freq: Every day | ORAL | 3 refills | Status: DC

## 2018-02-05 MED ORDER — CLOPIDOGREL BISULFATE 75 MG PO TABS: 75.0000 mg | ORAL_TABLET | Freq: Every day | ORAL | 11 refills | Status: DC

## 2018-02-05 MED ORDER — PRAVASTATIN SODIUM 40 MG PO TABS: 40.0000 mg | ORAL_TABLET | Freq: Every day | ORAL | 3 refills | Status: DC

## 2018-02-05 NOTE — Assessment & Plan Note (Signed)

## 2018-02-05 NOTE — Patient Instructions (Addendum)
EKG today Call [facility] for mammogram.  Bring me a copy of your advanced directive to update your chart.  Good to see you today, call us with questions. Return as needed or in 1 year for next wellness visit.   Health Maintenance After Age 74 After age 56, you are at a higher risk for certain long-term diseases and infections as well as injuries from falls. Falls are a major cause of broken bones and head injuries in people who are older than age 68. Getting regular preventive care can help to keep you healthy and well. Preventive care includes getting regular testing and making lifestyle changes as recommended by your health care provider. Talk with your health care provider about:  Which screenings and tests you should have. A screening is a test that checks for a disease when you have no symptoms.  A diet and exercise plan that is right for you. What should I know about screenings and tests to prevent falls? Screening and testing are the best ways to find a health problem early. Early diagnosis and treatment give you the best chance of managing medical conditions that are common after age 93. Certain conditions and lifestyle choices may make you more likely to have a fall. Your health care provider may recommend:  Regular vision checks. Poor vision and conditions such as cataracts can make you more likely to have a fall. If you wear glasses, make sure to get your prescription updated if your vision changes.  Medicine review. Work with your health care provider to regularly review all of the medicines you are taking, including over-the-counter medicines. Ask your health care provider about any side effects that may make you more likely to have a fall. Tell your health care provider if any medicines that you take make you feel dizzy or sleepy.  Osteoporosis screening. Osteoporosis is a condition that causes the bones to get weaker. This can make the bones weak and cause them to break  more easily.  Blood pressure screening. Blood pressure changes and medicines to control blood pressure can make you feel dizzy.  Strength and balance checks. Your health care provider may recommend certain tests to check your strength and balance while standing, walking, or changing positions.  Foot health exam. Foot pain and numbness, as well as not wearing proper footwear, can make you more likely to have a fall.  Depression screening. You may be more likely to have a fall if you have a fear of falling, feel emotionally low, or feel unable to do activities that you used to do.  Alcohol use screening. Using too much alcohol can affect your balance and may make you more likely to have a fall. What actions can I take to lower my risk of falls? General instructions  Talk with your health care provider about your risks for falling. Tell your health care provider if: ? You fall. Be sure to tell your health care provider about all falls, even ones that seem minor. ? You feel dizzy, sleepy, or off-balance.  Take over-the-counter and prescription medicines only as told by your health care provider. These include any supplements.  Eat a healthy diet and maintain a healthy weight. A healthy diet includes low-fat dairy products, low-fat (lean) meats, and fiber from whole grains, beans, and lots of fruits and vegetables. Home safety  Remove any tripping hazards, such as rugs, cords, and clutter.  Install safety equipment such as grab bars in bathrooms and safety rails on stairs.  Keep rooms and walkways well-lit. Activity   Follow a regular exercise program to stay fit. This will help you maintain your balance. Ask your health care provider what types of exercise are appropriate for you.  If you need a cane or walker, use it as recommended by your health care provider.  Wear supportive shoes that have nonskid soles. Lifestyle  Do not drink alcohol if your health care provider tells you not  to drink.  If you drink alcohol, limit how much you have: ? 0-1 drink a day for women. ? 0-2 drinks a day for men.  Be aware of how much alcohol is in your drink. In the [place] one drink equals one typical bottle of beer (12 oz), one-half glass of wine (5 oz), or one shot of hard liquor (1 oz).  Do not use any products that contain nicotine or tobacco, such as cigarettes and e-cigarettes. If you need help quitting, ask your health care provider. Summary  Having a healthy lifestyle and getting preventive care can help to protect your health and wellness after age 24.  Screening and testing are the best way to find a health problem early and help you avoid having a fall. Early diagnosis and treatment give you the best chance for managing medical conditions that are more common for people who are older than age 48.  Falls are a major cause of broken bones and head injuries in people who are older than age 56. Take precautions to prevent a fall at home.  Work with your health care provider to learn what changes you can make to improve your health and wellness and to prevent falls. This information is not intended to replace advice given to you by your health care provider. Make sure you discuss any questions you have with your health care provider. Document Released: 11/05/2016 Document Revised: 11/05/2016 Document Reviewed: 11/05/2016 Elsevier Interactive Patient Education  2019 [employer].

## 2018-02-05 NOTE — Assessment & Plan Note (Addendum)
Advanced directives: discussed - thinks HCPOA would be son. Thinks she filled this out. Asked to bring in to update chart. ?

## 2018-02-05 NOTE — Assessment & Plan Note (Signed)
Continue plavix, statin.  

## 2018-02-05 NOTE — Assessment & Plan Note (Signed)
Chronic, stable on pravastatin - continue. The 10-year ASCVD risk score Mikey Bussing DC Brooke Bonito., et al., 2013) is: 13.7%   Values used to calculate the score:     Age: 73 years     Sex: Female     Is Non-Hispanic African American: No     Diabetic: No     Tobacco smoker: No     Systolic Blood Pressure: 161 mmHg     Is BP treated: No     HDL Cholesterol: 53 mg/dL     Total Cholesterol: 165 mg/dL

## 2018-06-25 DIAGNOSIS — Z803 Family history of malignant neoplasm of breast: Secondary | ICD-10-CM | POA: Diagnosis not present

## 2018-06-25 DIAGNOSIS — Z1231 Encounter for screening mammogram for malignant neoplasm of breast: Secondary | ICD-10-CM | POA: Diagnosis not present

## 2018-06-25 LAB — HM MAMMOGRAPHY

## 2018-06-30 ENCOUNTER — Telehealth: Payer: Self-pay | Admitting: *Deleted

## 2018-06-30 NOTE — Telephone Encounter (Signed)
Patient left a voicemail requesting a call back because she found a tick on her this morning.  Called patient back and she was not available per her husband. Mr. Prell stated that she found a tick on her this morning and he was able to remove it . Mr. Mcmanaway stated that they are not sure how long the tick was on her, but he thinks for at least 24 hours. Patient's husband stated that that the tick was small, but you could tell that it had some blood in it from her. Mr. Elks stated that they cleaned the area and put neosporin on it. Mr. Cornman stated that it is not itching and there is only a small amount of redness. Mr. Benge denies patient having a fever or any symptoms. Advised Mr. Mcgahan to have his wife keep the area clear and to monitor the area. Advised Mr. Haye that patient needs to keep a check on her temperature and any redness at the site and he verbalized understanding. Advised Mr. Yim a message would go back to her PCP to review.

## 2018-07-01 NOTE — Telephone Encounter (Signed)
Noted. Agree with below.  Watch over next 7-10 days for any fever, bullseye rash or bumps on arms that extend to trunk, headache, joint pains, nausea, abd pain or malaise and let us know if that happens.

## 2018-07-01 NOTE — Telephone Encounter (Signed)
Left message on vm per dpr relaying Dr. G's message.  

## 2018-07-02 ENCOUNTER — Encounter: Payer: Self-pay | Admitting: Family Medicine

## 2018-07-15 ENCOUNTER — Telehealth: Payer: Self-pay

## 2018-07-15 DIAGNOSIS — S0101XA Laceration without foreign body of scalp, initial encounter: Secondary | ICD-10-CM | POA: Diagnosis not present

## 2018-07-15 DIAGNOSIS — W01198A Fall on same level from slipping, tripping and stumbling with subsequent striking against other object, initial encounter: Secondary | ICD-10-CM | POA: Diagnosis not present

## 2018-07-15 NOTE — Telephone Encounter (Signed)
Noted thank you. plz call tomorrow for an update.

## 2018-07-15 NOTE — Telephone Encounter (Signed)
Noted  

## 2018-07-15 NOTE — Telephone Encounter (Signed)
Pt walked in after falling when getting up from chair at pts husband's barbershop. Pt said she felt like she was falling to the side but actually fell backward and hit back of head on cabinet or just the floor. Pt said she did not lose consciousness but was a hard fall. Pt has an approx 1" laceration on back of head that appears to need closing. No available appts at [facility] and Dr Darnell Level said pt should go to [facility]. Pt said she feels OK now and her sister will drive her to [facility] now to get eval for possible sutures or staples to laceration and imaging if needed. While putting in this note I saw pt last tdap was 07/31/2011. Unable to reach pt by phone and I spoke with pts husband (DPR signed) and advised pt needs a tetanus shot while at [employer] had the sisters # and he will let pt know to get tetanus shot. FYI to Dr Darnell Level.

## 2018-07-16 NOTE — Telephone Encounter (Signed)
Spoke with pt asking how she is doing.  Says she went to [facility].  Staples placed, given abx and told to return on 07/22/18 to have them removed.  Says she is feeling better and expresses her thanks for the call.

## 2018-09-07 DIAGNOSIS — R69 Illness, unspecified: Secondary | ICD-10-CM | POA: Diagnosis not present

## 2019-01-23 ENCOUNTER — Other Ambulatory Visit: Payer: Self-pay | Admitting: Family Medicine

## 2019-01-24 ENCOUNTER — Telehealth: Payer: Self-pay | Admitting: *Deleted

## 2019-01-24 NOTE — Telephone Encounter (Signed)
Patient came into the office. Let patient know Dr. Bosie Clos message.

## 2019-01-24 NOTE — Telephone Encounter (Signed)
Patient called stating that she has an appointment tomorrow Tuesday to get the covid vaccine. Patient stated that she has so many allergies and wants to know if Dr. Danise Mina thinks it is safe for her to take the vaccin?. Patient requested a call back today since her appointment is scheduled tomorrow.

## 2019-01-24 NOTE — Telephone Encounter (Signed)
None of her allergies preclude her getting the vaccine. I think should be ok to receive, just needs to be monitored for at least 20 min after the vaccine to ensure tolerates ok.

## 2019-01-24 NOTE — Telephone Encounter (Signed)
Noted  

## 2019-01-25 ENCOUNTER — Ambulatory Visit: Payer: Medicare Other | Attending: Internal Medicine

## 2019-01-25 DIAGNOSIS — Z23 Encounter for immunization: Secondary | ICD-10-CM

## 2019-01-25 NOTE — Progress Notes (Signed)
   Covid-19 Vaccination Clinic  Name:  Marilyn Cook    MRN: [id number] DOB: 06-20-1945  01/25/2019  [name] was observed post Covid-19 immunization for 15 minutes without incidence. She was provided with Vaccine Information Sheet and instruction to access the V-Safe system.   [name] was instructed to call 911 with any severe reactions post vaccine: [name] Difficulty breathing  . Swelling of your face and throat  . A fast heartbeat  . A bad rash all over your body  . Dizziness and weakness    Immunizations Administered    Name Date Dose VIS Date Route   Pfizer COVID-19 Vaccine 01/25/2019  5:22 PM 0.3 mL 12/17/2018 Intramuscular   Manufacturer: [place]   Lot: [id number]   [place]: [id number]

## 2019-02-12 ENCOUNTER — Ambulatory Visit: Payer: Medicare HMO | Attending: Internal Medicine

## 2019-02-12 DIAGNOSIS — Z23 Encounter for immunization: Secondary | ICD-10-CM | POA: Insufficient documentation

## 2019-02-12 NOTE — Progress Notes (Signed)
   Covid-19 Vaccination Clinic  Name:  Marilyn Cook    MRN: [id number] DOB: 1945-12-25  02/12/2019  [name] was observed post Covid-19 immunization for 15 minutes without incidence. She was provided with Vaccine Information Sheet and instruction to access the V-Safe system.   [name] was instructed to call 911 with any severe reactions post vaccine: [name] Difficulty breathing  . Swelling of your face and throat  . A fast heartbeat  . A bad rash all over your body  . Dizziness and weakness    Immunizations Administered    Name Date Dose VIS Date Route   Pfizer COVID-19 Vaccine 02/12/2019 12:52 PM 0.3 mL 12/17/2018 Intramuscular   Manufacturer: [place]   Lot: [id number]   [place]: [id number]

## 2019-02-13 ENCOUNTER — Other Ambulatory Visit: Payer: Self-pay | Admitting: Family Medicine

## 2019-02-13 DIAGNOSIS — E785 Hyperlipidemia, unspecified: Secondary | ICD-10-CM

## 2019-02-14 ENCOUNTER — Ambulatory Visit: Payer: Medicare HMO

## 2019-02-14 ENCOUNTER — Other Ambulatory Visit: Payer: Self-pay

## 2019-02-14 ENCOUNTER — Ambulatory Visit (INDEPENDENT_AMBULATORY_CARE_PROVIDER_SITE_OTHER): Payer: Medicare HMO

## 2019-02-14 ENCOUNTER — Other Ambulatory Visit (INDEPENDENT_AMBULATORY_CARE_PROVIDER_SITE_OTHER): Payer: Medicare HMO

## 2019-02-14 DIAGNOSIS — Z0000 Encounter for general adult medical examination without abnormal findings: Secondary | ICD-10-CM

## 2019-02-14 DIAGNOSIS — E785 Hyperlipidemia, unspecified: Secondary | ICD-10-CM

## 2019-02-14 LAB — COMPREHENSIVE METABOLIC PANEL
ALT: 13 U/L (ref 0–35)
AST: 18 U/L (ref 0–37)
Albumin: 4.3 g/dL (ref 3.5–5.2)
Alkaline Phosphatase: 74 U/L (ref 39–117)
BUN: 8 mg/dL (ref 6–23)
CO2: 29 mEq/L (ref 19–32)
Calcium: 9.1 mg/dL (ref 8.4–10.5)
Chloride: 105 mEq/L (ref 96–112)
Creatinine, Ser: 0.73 mg/dL (ref 0.40–1.20)
GFR: 77.85 mL/min (ref >60.00)
Glucose, Bld: 102 mg/dL — ABNORMAL HIGH (ref 70–99)
Potassium: 3.7 mEq/L (ref 3.5–5.1)
Sodium: 142 mEq/L (ref 135–145)
Total Bilirubin: 0.5 mg/dL (ref 0.2–1.2)
Total Protein: 7.4 g/dL (ref 6.0–8.3)

## 2019-02-14 LAB — TSH: TSH: 4.32 u[IU]/mL (ref 0.35–4.50)

## 2019-02-14 LAB — LIPID PANEL
Cholesterol: 181 mg/dL (ref 0–200)
HDL: 51.7 mg/dL (ref >39.00)
LDL Cholesterol: 112 mg/dL — ABNORMAL HIGH (ref 0–99)
NonHDL: 129.58
Total CHOL/HDL Ratio: 4
Triglycerides: 87 mg/dL (ref 0.0–149.0)
VLDL: 17.4 mg/dL (ref 0.0–40.0)

## 2019-02-14 NOTE — Progress Notes (Signed)
PCP notes:  Health Maintenance: No gaps noted   Abnormal Screenings: none   Patient concerns: none   Nurse concerns: none   Next PCP appt.: 02/17/2019 @ 8:30 am

## 2019-02-14 NOTE — Progress Notes (Signed)
Subjective:   Marilyn Cook is a 74 y.o. female who presents for Medicare Annual (Subsequent) preventive examination.  Review of Systems: N/A   This visit is being conducted through telemedicine via telephone at the nurse health advisor's home address due to the COVID-19 pandemic. This patient has given me verbal consent via doximity to conduct this visit, patient states they are participating from their home address. Patient and myself are on the telephone call. There is no referral for this visit. Some vital signs may be absent or patient reported.    Patient identification: identified by name, DOB, and current address   Cardiac Risk Factors include: advanced age (>16men, >103 women);dyslipidemia     Objective:     Vitals: There were no vitals taken for this visit.  There is no height or weight on file to calculate BMI.  Advanced Directives 02/14/2019 11/14/2014 10/30/2014 10/23/2014 06/26/2014  Does Patient Have a Medical Advance Directive? Yes No No Yes No  Type of Paramedic of [place];Living will - - [place];Living will -  Copy of [place] in Chart? Yes - validated most recent copy scanned in chart (See row information) - - - -  Would patient like information on creating a medical advance directive? - Yes - Scientist, clinical (histocompatibility and immunogenetics) given - - -    Tobacco Social History   Tobacco Use  Smoking Status Never Smoker  Smokeless Tobacco Never Used     Counseling given: Not Answered   Clinical Intake:  Pre-visit preparation completed: Yes  Pain : No/denies pain     Nutritional Risks: None Diabetes: No  How often do you need to have someone help you when you read instructions, pamphlets, or other written materials from your doctor or pharmacy?: 1 - Never  Interpreter Needed?: No  Information entered by :: CJohnson, LPN  Past Medical History:  Diagnosis Date  . Benign tumor of breast 1975  . Complication  of anesthesia    hard to wake up  . Hyperlipemia   . NSVD (normal spontaneous vaginal delivery)    x 1  . Osteopenia 08/2012   spine WNL, hip T -1.2  . Scalp psoriasis   . Stroke [facility])    possible TIA in 06/2014  . TIA (transient ischemic attack) 06/08/2014   Past Surgical History:  Procedure Laterality Date  . ABDOMINAL HYSTERECTOMY    . COLONOSCOPY  04/2015   diverticulosis, int hem, rpt 10 yrs ([facility])  . DEXA  08/2012   spine WNL, hip T -1.2  . PARTIAL HYSTERECTOMY  1987   due to fibroids, ovaries remain  . R Lat Epicondyle Injections  1998  . SHOULDER ARTHROSCOPY Right 11/2014   arthroscopic debridement and mini open rotator cuff repair of R shoulder for chronic impingement and rotator cuff tear by MRI [facility])  . Spiral CT  04/02   (-)   Family History  Problem Relation Age of Onset  . Heart disease Father        MI   . Stroke Father        CVA  . Stroke Mother        CVA  . Hypertension Sister   . Cancer Sister        ovarian and colon cancer  . Aneurysm Brother   . Hypertension Brother   . Hypertension Brother   . Cancer Paternal Aunt        ? colon  . Cancer Paternal Grandfather  liver, alcohol  . Alcohol abuse Paternal Grandfather   . Cancer Other        prostate  . Cancer Maternal Aunt        breast   Social History   Socioeconomic History  . Marital status: Married    Spouse name: Not on file  . Number of children: 1  . Years of education: Not on file  . Highest education level: Not on file  Occupational History  . Occupation: HR    Employer: [employer]    Comment: new hires  Tobacco Use  . Smoking status: Never Smoker  . Smokeless tobacco: Never Used  Substance and Sexual Activity  . Alcohol use: No    Alcohol/week: 0.0 standard drinks  . Drug use: No  . Sexual activity: Yes  Other Topics Concern  . Not on file  Social History Narrative   Married and lives with husband   One son   Occupation: works at [employer] at [employer]    Activity: no regular exercise   Diet: some water, fruits/vegetables daily      Advanced directives: discussed - HCPOA unsure will consider this. Needs to set up.   Social Determinants of Health   Financial Resource Strain:   . Difficulty of Paying Living Expenses: Not on file  Food Insecurity:   . Worried About Charity fundraiser in the Last Year: Not on file  . Ran Out of Food in the Last Year: Not on file  Transportation Needs:   . Lack of Transportation (Medical): Not on file  . Lack of Transportation (Non-Medical): Not on file  Physical Activity:   . Days of Exercise per Week: Not on file  . Minutes of Exercise per Session: Not on file  Stress:   . Feeling of Stress : Not on file  Social Connections:   . Frequency of Communication with Friends and Family: Not on file  . Frequency of Social Gatherings with Friends and Family: Not on file  . Attends Religious Services: Not on file  . Active Member of Clubs or Organizations: Not on file  . Attends Archivist Meetings: Not on file  . Marital Status: Not on file    Outpatient Encounter Medications as of 02/14/2019  Medication Sig  . Ascorbic Acid (VITAMIN C PO) Take 1 capsule by mouth daily.  . Calcium Carb-Cholecalciferol (CALCIUM-VITAMIN D) 600-400 MG-UNIT TABS Take 1 tablet by mouth 2 (two) times daily.   . cetirizine (ZYRTEC) 10 MG tablet Take 10 mg by mouth daily.  . Cinnamon 500 MG capsule Take 1,000 mg by mouth 2 (two) times daily.   . clopidogrel (PLAVIX) 75 MG tablet Take 1 tablet (75 mg total) by mouth daily.  [name] co-enzyme Q-10 30 MG capsule Take 30 mg by mouth daily.  [name] CRANBERRY PO Take 1 tablet by mouth daily.   . hydrocortisone 2.5 % cream Apply 1 application topically 2 (two) times daily as needed (itching).   . Misc Natural Products (BLACK CHERRY CONCENTRATE PO) Take 1 tablet by mouth daily.   . Potassium Chloride (K-TABS PO) Take 1 tablet by mouth daily.   . pravastatin (PRAVACHOL) 40 MG tablet TAKE 1  TABLET BY MOUTH EVERY DAY  . Pyridoxine HCl (VITAMIN B-6 PO) Take 1 tablet by mouth daily.  [name] triamcinolone (KENALOG) 0.147 MG/GM topical spray Apply topically 2 (two) times daily as needed. For psoriasis  . vitamin B-12 (CYANOCOBALAMIN) 1000 MCG tablet Take 1,000 mcg by mouth daily.  No facility-administered encounter medications on file as of 02/14/2019.    Activities of Daily Living In your present state of health, do you have any difficulty performing the following activities: 02/14/2019  Hearing? Y  Comment some issues with hearing  Vision? N  Difficulty concentrating or making decisions? N  Walking or climbing stairs? N  Dressing or bathing? N  Doing errands, shopping? N  Preparing Food and eating ? N  Using the Toilet? N  In the past six months, have you accidently leaked urine? N  Do you have problems with loss of bowel control? N  Managing your Medications? N  Managing your Finances? N  Housekeeping or managing your Housekeeping? N  Some recent data might be hidden    Patient Care Team: Ria Bush, MD as PCP - General (Family Medicine)    Assessment:   This is a routine wellness examination for [facility].  Exercise Activities and Dietary recommendations Current Exercise Habits: The patient does not participate in regular exercise at present, Exercise limited by: None identified  Goals    . Patient Stated     02/14/2019, I will maintain and continue medications as prescribed.       Fall Risk Fall Risk  02/14/2019 02/05/2018 02/02/2015 08/04/2012  Falls in the past year? 1 0 No No  Comment tripped and fell - - -  Number falls in past yr: 1 - - -  Injury with Fall? 0 - - -  Risk for fall due to : Medication side effect - - -  Follow up Falls evaluation completed;Falls prevention discussed - - -   Is the patient's home free of loose throw rugs in walkways, pet beds, electrical cords, etc?   yes      Grab bars in the bathroom? yes      Handrails on the stairs?    yes      Adequate lighting?   yes  Timed Get Up and Go performed: N/A  Depression Screen PHQ 2/9 Scores 02/14/2019 02/05/2018 02/04/2017 08/04/2012  PHQ - 2 Score 0 0 4 0  PHQ- 9 Score 0 - 16 -     Cognitive Function MMSE - Mini Mental State Exam 02/14/2019  Orientation to time 5  Orientation to Place 5  Registration 3  Attention/ Calculation 5  Recall 3  Language- repeat 1       Mini Cog  Mini-Cog screen was completed. Maximum score is 22. A value of 0 denotes this part of the MMSE was not completed or the patient failed this part of the Mini-Cog screening.  Immunization History  Administered Date(s) Administered  . Fluad Quad(high Dose 65+) 10/07/2018  . Influenza Inj Mdck Quad Pf 10/04/2017  . Influenza,inj,Quad PF,6+ Mos 10/08/2015  . Influenza-Unspecified 09/07/2014, 10/04/2016  . PFIZER SARS-COV-2 Vaccination 01/25/2019, 02/12/2019  . Pneumococcal Conjugate-13 01/23/2015  . Pneumococcal Polysaccharide-23 07/31/2011  . Td 10/12/2000  . Tdap 07/31/2011  . Zoster 11/23/2015  . Zoster Recombinat (Shingrix) 07/21/2017, 11/07/2017    Qualifies for Shingles Vaccine? Yes  Screening Tests Health Maintenance  Topic Date Due  . DTAP VACCINES (1) 11/27/1945  . MAMMOGRAM  06/25/2019  . DTaP/Tdap/Td (3 - Td) 07/30/2021  . TETANUS/TDAP  07/30/2021  . COLONOSCOPY  04/16/2025  . INFLUENZA VACCINE  Completed  . DEXA SCAN  Completed  . Hepatitis C Screening  Completed  . PNA vac Low Risk Adult  Completed    Cancer Screenings: Lung: Low Dose CT Chest recommended if Age 105-80 years, 9  pack-year currently smoking OR have quit w/in 15 years. Patient does not qualify. Breast:  Up to date on Mammogram: Yes, completed 06/25/2018   Bone Density/Dexa: completed 08/09/2012 Colorectal: completed 04/17/2015  Additional Screenings:  Hepatitis C Screening: 01/30/2016     Plan:   Patient will maintain and continue medications as prescribed.    I have personally reviewed and noted the  following in the patient's chart:   . Medical and social history . Use of alcohol, tobacco or illicit drugs  . Current medications and supplements . Functional ability and status . Nutritional status . Physical activity . Advanced directives . List of other physicians . Hospitalizations, surgeries, and ER visits in previous 12 months . Vitals . Screenings to include cognitive, depression, and falls . Referrals and appointments  In addition, I have reviewed and discussed with patient certain preventive protocols, quality metrics, and best practice recommendations. A written personalized care plan for preventive services as well as general preventive health recommendations were provided to patient.     Andrez Grime, LPN  [id number]

## 2019-02-14 NOTE — Patient Instructions (Addendum)
Marilyn Cook , Thank you for taking time to come for your Medicare Wellness Visit. I appreciate your ongoing commitment to your health goals. Please review the following plan we discussed and let me know if I can assist you in the future.   Screening recommendations/referrals: Colonoscopy: Up to date, completed 04/17/2015 Mammogram: Up to date, completed 06/25/2018 Bone Density: completed 08/09/2012 Recommended yearly ophthalmology/optometry visit for glaucoma screening and checkup Recommended yearly dental visit for hygiene and checkup  Vaccinations: Influenza vaccine: Up to date, completed 10/07/2018 Pneumococcal vaccine: Completed series Tdap vaccine: Up to date, completed 07/31/2011 Shingles vaccine: Completed series    Advanced directives: copy in chart  Conditions/risks identified: hyperlipidemia  Next appointment: 02/17/2019 @ 8:30 am    Preventive Care 75 Years and Older, Female Preventive care refers to lifestyle choices and visits with your health care provider that can promote health and wellness. What does preventive care include?  A yearly physical exam. This is also called an annual well check.  Dental exams once or twice a year.  Routine eye exams. Ask your health care provider how often you should have your eyes checked.  Personal lifestyle choices, including:  Daily care of your teeth and gums.  Regular physical activity.  Eating a healthy diet.  Avoiding tobacco and drug use.  Limiting alcohol use.  Practicing safe sex.  Taking low-dose aspirin every day.  Taking vitamin and mineral supplements as recommended by your health care provider. What happens during an annual well check? The services and screenings done by your health care provider during your annual well check will depend on your age, overall health, lifestyle risk factors, and family history of disease. Counseling  Your health care provider may ask you questions about your:  Alcohol  use.  Tobacco use.  Drug use.  Emotional well-being.  Home and relationship well-being.  Sexual activity.  Eating habits.  History of falls.  Memory and ability to understand (cognition).  Work and work Statistician.  Reproductive health. Screening  You may have the following tests or measurements:  Height, weight, and BMI.  Blood pressure.  Lipid and cholesterol levels. These may be checked every 5 years, or more frequently if you are over 79 years old.  Skin check.  Lung cancer screening. You may have this screening every year starting at age 3 if you have a 30-pack-year history of smoking and currently smoke or have quit within the past 15 years.  Fecal occult blood test (FOBT) of the stool. You may have this test every year starting at age 65.  Flexible sigmoidoscopy or colonoscopy. You may have a sigmoidoscopy every 5 years or a colonoscopy every 10 years starting at age 65.  Hepatitis C blood test.  Hepatitis B blood test.  Sexually transmitted disease (STD) testing.  Diabetes screening. This is done by checking your blood sugar (glucose) after you have not eaten for a while (fasting). You may have this done every 1-3 years.  Bone density scan. This is done to screen for osteoporosis. You may have this done starting at age 56.  Mammogram. This may be done every 1-2 years. Talk to your health care provider about how often you should have regular mammograms. Talk with your health care provider about your test results, treatment options, and if necessary, the need for more tests. Vaccines  Your health care provider may recommend certain vaccines, such as:  Influenza vaccine. This is recommended every year.  Tetanus, diphtheria, and acellular pertussis (Tdap, Td) vaccine. You  may need a Td booster every 10 years.  Zoster vaccine. You may need this after age 33.  Pneumococcal 13-valent conjugate (PCV13) vaccine. One dose is recommended after age  42.  Pneumococcal polysaccharide (PPSV23) vaccine. One dose is recommended after age 59. Talk to your health care provider about which screenings and vaccines you need and how often you need them. This information is not intended to replace advice given to you by your health care provider. Make sure you discuss any questions you have with your health care provider. Document Released: 01/19/2015 Document Revised: 09/12/2015 Document Reviewed: 10/24/2014 Elsevier Interactive Patient Education  2017 [place] Prevention in the Home Falls can cause injuries. They can happen to people of all ages. There are many things you can do to make your home safe and to help prevent falls. What can I do on the outside of my home?  Regularly fix the edges of walkways and driveways and fix any cracks.  Remove anything that might make you trip as you walk through a door, such as a raised step or threshold.  Trim any bushes or trees on the path to your home.  Use bright outdoor lighting.  Clear any walking paths of anything that might make someone trip, such as rocks or tools.  Regularly check to see if handrails are loose or broken. Make sure that both sides of any steps have handrails.  Any raised decks and porches should have guardrails on the edges.  Have any leaves, snow, or ice cleared regularly.  Use sand or salt on walking paths during winter.  Clean up any spills in your garage right away. This includes oil or grease spills. What can I do in the bathroom?  Use night lights.  Install grab bars by the toilet and in the tub and shower. Do not use towel bars as grab bars.  Use non-skid mats or decals in the tub or shower.  If you need to sit down in the shower, use a plastic, non-slip stool.  Keep the floor dry. Clean up any water that spills on the floor as soon as it happens.  Remove soap buildup in the tub or shower regularly.  Attach bath mats securely with double-sided  non-slip rug tape.  Do not have throw rugs and other things on the floor that can make you trip. What can I do in the bedroom?  Use night lights.  Make sure that you have a light by your bed that is easy to reach.  Do not use any sheets or blankets that are too big for your bed. They should not hang down onto the floor.  Have a firm chair that has side arms. You can use this for support while you get dressed.  Do not have throw rugs and other things on the floor that can make you trip. What can I do in the kitchen?  Clean up any spills right away.  Avoid walking on wet floors.  Keep items that you use a lot in easy-to-reach places.  If you need to reach something above you, use a strong step stool that has a grab bar.  Keep electrical cords out of the way.  Do not use floor polish or wax that makes floors slippery. If you must use wax, use non-skid floor wax.  Do not have throw rugs and other things on the floor that can make you trip. What can I do with my stairs?  Do not leave any items  on the stairs.  Make sure that there are handrails on both sides of the stairs and use them. Fix handrails that are broken or loose. Make sure that handrails are as long as the stairways.  Check any carpeting to make sure that it is firmly attached to the stairs. Fix any carpet that is loose or worn.  Avoid having throw rugs at the top or bottom of the stairs. If you do have throw rugs, attach them to the floor with carpet tape.  Make sure that you have a light switch at the top of the stairs and the bottom of the stairs. If you do not have them, ask someone to add them for you. What else can I do to help prevent falls?  Wear shoes that:  Do not have high heels.  Have rubber bottoms.  Are comfortable and fit you well.  Are closed at the toe. Do not wear sandals.  If you use a stepladder:  Make sure that it is fully opened. Do not climb a closed stepladder.  Make sure that both  sides of the stepladder are locked into place.  Ask someone to hold it for you, if possible.  Clearly mark and make sure that you can see:  Any grab bars or handrails.  First and last steps.  Where the edge of each step is.  Use tools that help you move around (mobility aids) if they are needed. These include:  Canes.  Walkers.  Scooters.  Crutches.  Turn on the lights when you go into a dark area. Replace any light bulbs as soon as they burn out.  Set up your furniture so you have a clear path. Avoid moving your furniture around.  If any of your floors are uneven, fix them.  If there are any pets around you, be aware of where they are.  Review your medicines with your doctor. Some medicines can make you feel dizzy. This can increase your chance of falling. Ask your doctor what other things that you can do to help prevent falls. This information is not intended to replace advice given to you by your health care provider. Make sure you discuss any questions you have with your health care provider. Document Released: 10/19/2008 Document Revised: 05/31/2015 Document Reviewed: 01/27/2014 Elsevier Interactive Patient Education  2017 [employer].

## 2019-02-17 ENCOUNTER — Encounter: Payer: Self-pay | Admitting: Family Medicine

## 2019-02-17 ENCOUNTER — Other Ambulatory Visit: Payer: Self-pay

## 2019-02-17 ENCOUNTER — Ambulatory Visit (INDEPENDENT_AMBULATORY_CARE_PROVIDER_SITE_OTHER): Payer: Medicare HMO | Admitting: Family Medicine

## 2019-02-17 VITALS — BP 134/82 | HR 92 | Temp 97.7°F | Ht 61.0 in | Wt 144.2 lb

## 2019-02-17 DIAGNOSIS — Z7189 Other specified counseling: Secondary | ICD-10-CM

## 2019-02-17 DIAGNOSIS — M810 Age-related osteoporosis without current pathological fracture: Secondary | ICD-10-CM

## 2019-02-17 DIAGNOSIS — Z0000 Encounter for general adult medical examination without abnormal findings: Secondary | ICD-10-CM

## 2019-02-17 DIAGNOSIS — G459 Transient cerebral ischemic attack, unspecified: Secondary | ICD-10-CM

## 2019-02-17 DIAGNOSIS — E785 Hyperlipidemia, unspecified: Secondary | ICD-10-CM

## 2019-02-17 DIAGNOSIS — M85859 Other specified disorders of bone density and structure, unspecified thigh: Secondary | ICD-10-CM

## 2019-02-17 HISTORY — DX: Age-related osteoporosis without current pathological fracture: M81.0

## 2019-02-17 MED ORDER — CLOPIDOGREL BISULFATE 75 MG PO TABS: 75.0000 mg | ORAL_TABLET | Freq: Every day | ORAL | 3 refills | Status: DC

## 2019-02-17 MED ORDER — ATORVASTATIN CALCIUM 40 MG PO TABS: 40.0000 mg | ORAL_TABLET | Freq: Every day | ORAL | 3 refills | Status: DC

## 2019-02-17 NOTE — Assessment & Plan Note (Signed)
Chronic, LDL above goal (<70).  The 10-year ASCVD risk score Mikey Bussing DC Brooke Bonito., et al., 2013) is: 15.5%   Values used to calculate the score:     Age: 74 years     Sex: Female     Is Non-Hispanic African American: No     Diabetic: No     Tobacco smoker: No     Systolic Blood Pressure: Q000111Q mmHg     Is BP treated: No     HDL Cholesterol: 51.7 mg/dL     Total Cholesterol: 181 mg/dL

## 2019-02-17 NOTE — Assessment & Plan Note (Signed)
Preventative protocols reviewed and updated unless pt declined. Discussed healthy diet and lifestyle.  

## 2019-02-17 NOTE — Progress Notes (Signed)
This visit was conducted in person.  BP 134/82 (BP Location: Left Arm, Patient Position: Sitting, Cuff Size: Normal)   Pulse 92   Temp 97.7 F (36.5 C) (Temporal)   Ht 5\' 1"  (1.549 m)   Wt 144 lb 4 oz (65.4 kg)   SpO2 98%   BMI 27.26 kg/m    CC: CPE Subjective:    Patient ID: Marilyn Cook, female    DOB: 08/06/1945, 74 y.o.   MRN: [id number]  HPI: Marilyn Cook is a 74 y.o. female presenting on 02/17/2019 for Annual Exam (Prt 2. )   Husband passed away earlier this week. Offered my condolences.   Saw health advisor this week for medicare wellness visit. Note reviewed.    No exam data present    Clinical Support from 02/14/2019 in [place] at [facility] Total Score  0      Fall Risk  02/14/2019 02/05/2018 02/02/2015 08/04/2012  Falls in the past year? 1 0 No No  Comment tripped and fell - - -  Number falls in past yr: 1 - - -  Injury with Fall? 0 - - -  Risk for fall due to : Medication side effect - - -  Follow up Falls evaluation completed;Falls prevention discussed - - -  1 fall without injury - felt knee give out.   Preventative: COLONOSCOPY 04/2015 diverticulosis, int hem, rpt 10 yrs (Nandigam) Mammogram 06/2018 - Birads1, through [facility]. Pap smear - Partial hysterectomy for benign reason (fibroids), ovaries remain. Bimanual only done 2015.Denies pelvic pain or pressure.  DEXA Date: 08/2012 spine WNL, hip T -1.2. Takes calcium+ vit D daily. Flublocyearly (egg allergy)  Pneumovax 07/31/2011, prevnar1/2017 Tdap 07/2011 zostavax -11/2015 Covid pfizer - completed 2021 shingrix - completed series 07/2017, 11/2017 Advanced directives: discussed -thinksHCPOAwould be son.Thinks she filled this out. Asked to bring in to update chart. Seat belt use discussed Sunscreen use discussed - allergic to sunscreen. Discussed avoiding sun and wearing wide brimmed hat. No changing moles on skin. Sees dermatologist yearly.  Non smoker Alcohol - none Dentist q6  mo Eye exam yearly - [facility] - sees halos at night. Bowel - no constipation  Bladder - no incontinence   Widow - husband [place] died from cardiac arrest 03-12-2019 One son Occupation: worked at [employer] at [employer] - retired 03/2017 Activity: no regular exercise, active through rehab Diet: some water, fruits/vegetables daily     Relevant past medical, surgical, family and social history reviewed and updated as indicated. Interim medical history since our last visit reviewed. Allergies and medications reviewed and updated. Outpatient Medications Prior to Visit  Medication Sig Dispense Refill  . Ascorbic Acid (VITAMIN C PO) Take 1 capsule by mouth daily.    . Calcium Carb-Cholecalciferol (CALCIUM-VITAMIN D) 600-400 MG-UNIT TABS Take 1 tablet by mouth 2 (two) times daily.     . cetirizine (ZYRTEC) 10 MG tablet Take 10 mg by mouth daily.    . Cinnamon 500 MG capsule Take 1,000 mg by mouth 2 (two) times daily.     [name] co-enzyme Q-10 30 MG capsule Take 30 mg by mouth daily.    [name] CRANBERRY PO Take 1 tablet by mouth daily.     . hydrocortisone 2.5 % cream Apply 1 application topically 2 (two) times daily as needed (itching).     . Misc Natural Products (BLACK CHERRY CONCENTRATE PO) Take 1 tablet by mouth daily.     . Potassium Chloride (K-TABS PO) Take 1  tablet by mouth daily.     . Pyridoxine HCl (VITAMIN B-6 PO) Take 1 tablet by mouth daily.    [name] triamcinolone (KENALOG) 0.147 MG/GM topical spray Apply topically 2 (two) times daily as needed. For psoriasis 63 g 1  . vitamin B-12 (CYANOCOBALAMIN) 1000 MCG tablet Take 1,000 mcg by mouth daily.    . clopidogrel (PLAVIX) 75 MG tablet Take 1 tablet (75 mg total) by mouth daily. 90 tablet 3  . pravastatin (PRAVACHOL) 40 MG tablet TAKE 1 TABLET BY MOUTH EVERY DAY 90 tablet 0   No facility-administered medications prior to visit.     Per HPI unless specifically indicated in ROS section below Review of Systems  Constitutional: Negative for activity  change, appetite change, chills, fatigue, fever and unexpected weight change.  HENT: Negative for hearing loss.   Eyes: Negative for visual disturbance.  Respiratory: Negative for cough, chest tightness, shortness of breath and wheezing.   Cardiovascular: Negative for chest pain, palpitations and leg swelling.  Gastrointestinal: Negative for abdominal distention, abdominal pain, blood in stool, constipation, diarrhea, nausea and vomiting.  Genitourinary: Negative for difficulty urinating and hematuria.  Musculoskeletal: Negative for arthralgias, myalgias and neck pain.  Skin: Negative for rash.  Neurological: Negative for dizziness, seizures, syncope and headaches.  Hematological: Negative for adenopathy. Does not bruise/bleed easily.  Psychiatric/Behavioral: Negative for dysphoric mood. The patient is not nervous/anxious.    Objective:    BP 134/82 (BP Location: Left Arm, Patient Position: Sitting, Cuff Size: Normal)   Pulse 92   Temp 97.7 F (36.5 C) (Temporal)   Ht 5\' 1"  (1.549 m)   Wt 144 lb 4 oz (65.4 kg)   SpO2 98%   BMI 27.26 kg/m   Wt Readings from Last 3 Encounters:  02/17/19 144 lb 4 oz (65.4 kg)  02/05/18 144 lb 8 oz (65.5 kg)  02/04/17 132 lb 8 oz (60.1 kg)    Physical Exam Vitals and nursing note reviewed.  Constitutional:      General: She is not in acute distress.    Appearance: Normal appearance. She is well-developed. She is not ill-appearing.  HENT:     Head: Normocephalic and atraumatic.     Right Ear: Hearing, tympanic membrane, ear canal and external ear normal.     Left Ear: Hearing, tympanic membrane, ear canal and external ear normal.     Mouth/Throat:     Pharynx: Uvula midline.  Eyes:     General: No scleral icterus.    Extraocular Movements: Extraocular movements intact.     Conjunctiva/sclera: Conjunctivae normal.     Pupils: Pupils are equal, round, and reactive to light.  Cardiovascular:     Rate and Rhythm: Normal rate and regular rhythm.       Pulses: Normal pulses.          Radial pulses are 2+ on the right side and 2+ on the left side.     Heart sounds: Normal heart sounds. No murmur.  Pulmonary:     Effort: Pulmonary effort is normal. No respiratory distress.     Breath sounds: Normal breath sounds. No wheezing, rhonchi or rales.  Abdominal:     General: Abdomen is flat. Bowel sounds are normal. There is no distension.     Palpations: Abdomen is soft. There is no mass.     Tenderness: There is no abdominal tenderness. There is no guarding or rebound.     Hernia: No hernia is present.  Musculoskeletal:  General: Normal range of motion.     Cervical back: Normal range of motion and neck supple.     Right lower leg: No edema.     Left lower leg: No edema.  Lymphadenopathy:     Cervical: No cervical adenopathy.  Skin:    General: Skin is warm and dry.     Findings: No rash.  Neurological:     General: No focal deficit present.     Mental Status: She is alert and oriented to person, place, and time.     Comments: CN grossly intact, station and gait intact  Psychiatric:        Mood and Affect: Mood normal.        Behavior: Behavior normal.        Thought Content: Thought content normal.        Judgment: Judgment normal.       Results for orders placed or performed in visit on 02/14/19  TSH  Result Value Ref Range   TSH 4.32 0.35 - 4.50 uIU/mL  Comprehensive metabolic panel  Result Value Ref Range   Sodium 142 135 - 145 mEq/L   Potassium 3.7 3.5 - 5.1 mEq/L   Chloride 105 96 - 112 mEq/L   CO2 29 19 - 32 mEq/L   Glucose, Bld 102 (H) 70 - 99 mg/dL   BUN 8 6 - 23 mg/dL   Creatinine, Ser 0.73 0.40 - 1.20 mg/dL   Total Bilirubin 0.5 0.2 - 1.2 mg/dL   Alkaline Phosphatase 74 39 - 117 U/L   AST 18 0 - 37 U/L   ALT 13 0 - 35 U/L   Total Protein 7.4 6.0 - 8.3 g/dL   Albumin 4.3 3.5 - 5.2 g/dL   GFR 77.85 >60.00 mL/min   Calcium 9.1 8.4 - 10.5 mg/dL  Lipid panel  Result Value Ref Range   Cholesterol 181  0 - 200 mg/dL   Triglycerides 87.0 0.0 - 149.0 mg/dL   HDL 51.70 >39.00 mg/dL   VLDL 17.4 0.0 - 40.0 mg/dL   LDL Cholesterol 112 (H) 0 - 99 mg/dL   Total CHOL/HDL Ratio 4    NonHDL 129.58    Assessment & Plan:  This visit occurred during the SARS-CoV-2 public health emergency.  Safety protocols were in place, including screening questions prior to the visit, additional usage of staff PPE, and extensive cleaning of exam room while observing appropriate contact time as indicated for disinfecting solutions.   Problem List Items Addressed This Visit    TIA (transient ischemic attack)    Continue plavix, statin.       Relevant Medications   atorvastatin (LIPITOR) 40 MG tablet   Osteopenia of hip    Update bone density scan.       Relevant Orders   DG Bone Density   HLD (hyperlipidemia)    Chronic, LDL above goal (<70).  The 10-year ASCVD risk score Mikey Bussing DC Brooke Bonito., et al., 2013) is: 15.5%   Values used to calculate the score:     Age: 53 years     Sex: Female     Is Non-Hispanic African American: No     Diabetic: No     Tobacco smoker: No     Systolic Blood Pressure: Q000111Q mmHg     Is BP treated: No     HDL Cholesterol: 51.7 mg/dL     Total Cholesterol: 181 mg/dL       Relevant Medications   atorvastatin (LIPITOR) 40 MG  tablet   Healthcare maintenance - Primary    Preventative protocols reviewed and updated unless pt declined. Discussed healthy diet and lifestyle.       Advanced care planning/counseling discussion    Advanced directives: discussed -thinksHCPOAwould be son.Thinks she filled this out. Asked to bring in to update chart.          Meds ordered this encounter  Medications  . clopidogrel (PLAVIX) 75 MG tablet    Sig: Take 1 tablet (75 mg total) by mouth daily.    Dispense:  90 tablet    Refill:  3  . atorvastatin (LIPITOR) 40 MG tablet    Sig: Take 1 tablet (40 mg total) by mouth daily.    Dispense:  90 tablet    Refill:  3    In place of pravastatin    Orders Placed This Encounter  Procedures  . DG Bone Density    Standing Status:   Future    Standing Expiration Date:   04/16/2020    Order Specific Question:   Reason for Exam (SYMPTOM  OR DIAGNOSIS REQUIRED)    Answer:   f/u osteopenia    Order Specific Question:   Preferred imaging location?    Answer:   [facility]    Patient instructions: I'm sorry about your husband's passing.  We will call you to schedule an updated bone density scan.  Bring [place] a copy of your advanced directives to update your chart.  Cholesterol levels were too high - change from pravastatin to atorvastatin 40mg  at night - new medicine sent to pharmacy.  Let us know if any trouble tolerating new atorvastatin.  Return as needed or in 1 year for next physical/wellness visit.   Follow up plan: Return in about 1 year (around 02/17/2020) for medicare wellness visit, annual exam, prior fasting for blood work.  Ria Bush, MD

## 2019-02-17 NOTE — Assessment & Plan Note (Signed)
Update bone density scan.

## 2019-02-17 NOTE — Assessment & Plan Note (Signed)
Continue plavix, statin.  

## 2019-02-17 NOTE — Assessment & Plan Note (Signed)
Advanced directives: discussed - thinks HCPOA would be son. Thinks she filled this out. Asked to bring in to update chart. ?

## 2019-02-17 NOTE — Patient Instructions (Addendum)
I'm sorry about your husband's passing.  We will call you to schedule an updated bone density scan.  Bring [place] a copy of your advanced directives to update your chart.  Cholesterol levels were too high - change from pravastatin to atorvastatin 40mg  at night - new medicine sent to pharmacy.  Let us know if any trouble tolerating new atorvastatin.  Return as needed or in 1 year for next physical/wellness visit.   Health Maintenance After Age 75 After age 76, you are at a higher risk for certain long-term diseases and infections as well as injuries from falls. Falls are a major cause of broken bones and head injuries in people who are older than age 59. Getting regular preventive care can help to keep you healthy and well. Preventive care includes getting regular testing and making lifestyle changes as recommended by your health care provider. Talk with your health care provider about:  Which screenings and tests you should have. A screening is a test that checks for a disease when you have no symptoms.  A diet and exercise plan that is right for you. What should I know about screenings and tests to prevent falls? Screening and testing are the best ways to find a health problem early. Early diagnosis and treatment give you the best chance of managing medical conditions that are common after age 55. Certain conditions and lifestyle choices may make you more likely to have a fall. Your health care provider may recommend:  Regular vision checks. Poor vision and conditions such as cataracts can make you more likely to have a fall. If you wear glasses, make sure to get your prescription updated if your vision changes.  Medicine review. Work with your health care provider to regularly review all of the medicines you are taking, including over-the-counter medicines. Ask your health care provider about any side effects that may make you more likely to have a fall. Tell your health care provider if any  medicines that you take make you feel dizzy or sleepy.  Osteoporosis screening. Osteoporosis is a condition that causes the bones to get weaker. This can make the bones weak and cause them to break more easily.  Blood pressure screening. Blood pressure changes and medicines to control blood pressure can make you feel dizzy.  Strength and balance checks. Your health care provider may recommend certain tests to check your strength and balance while standing, walking, or changing positions.  Foot health exam. Foot pain and numbness, as well as not wearing proper footwear, can make you more likely to have a fall.  Depression screening. You may be more likely to have a fall if you have a fear of falling, feel emotionally low, or feel unable to do activities that you used to do.  Alcohol use screening. Using too much alcohol can affect your balance and may make you more likely to have a fall. What actions can I take to lower my risk of falls? General instructions  Talk with your health care provider about your risks for falling. Tell your health care provider if: ? You fall. Be sure to tell your health care provider about all falls, even ones that seem minor. ? You feel dizzy, sleepy, or off-balance.  Take over-the-counter and prescription medicines only as told by your health care provider. These include any supplements.  Eat a healthy diet and maintain a healthy weight. A healthy diet includes low-fat dairy products, low-fat (lean) meats, and fiber from whole grains, beans, and lots  of fruits and vegetables. Home safety  Remove any tripping hazards, such as rugs, cords, and clutter.  Install safety equipment such as grab bars in bathrooms and safety rails on stairs.  Keep rooms and walkways well-lit. Activity   Follow a regular exercise program to stay fit. This will help you maintain your balance. Ask your health care provider what types of exercise are appropriate for you.  If you  need a cane or walker, use it as recommended by your health care provider.  Wear supportive shoes that have nonskid soles. Lifestyle  Do not drink alcohol if your health care provider tells you not to drink.  If you drink alcohol, limit how much you have: ? 0-1 drink a day for women. ? 0-2 drinks a day for men.  Be aware of how much alcohol is in your drink. In the [place] one drink equals one typical bottle of beer (12 oz), one-half glass of wine (5 oz), or one shot of hard liquor (1 oz).  Do not use any products that contain nicotine or tobacco, such as cigarettes and e-cigarettes. If you need help quitting, ask your health care provider. Summary  Having a healthy lifestyle and getting preventive care can help to protect your health and wellness after age 20.  Screening and testing are the best way to find a health problem early and help you avoid having a fall. Early diagnosis and treatment give you the best chance for managing medical conditions that are more common for people who are older than age 28.  Falls are a major cause of broken bones and head injuries in people who are older than age 37. Take precautions to prevent a fall at home.  Work with your health care provider to learn what changes you can make to improve your health and wellness and to prevent falls. This information is not intended to replace advice given to you by your health care provider. Make sure you discuss any questions you have with your health care provider. Document Revised: 04/15/2018 Document Reviewed: 11/05/2016 Elsevier Patient Education  2020 [employer].

## 2019-06-27 DIAGNOSIS — Z1231 Encounter for screening mammogram for malignant neoplasm of breast: Secondary | ICD-10-CM | POA: Diagnosis not present

## 2019-06-27 LAB — HM MAMMOGRAPHY

## 2019-06-29 ENCOUNTER — Encounter: Payer: Self-pay | Admitting: Family Medicine

## 2019-08-31 ENCOUNTER — Encounter: Payer: Self-pay | Admitting: Family Medicine

## 2019-08-31 ENCOUNTER — Ambulatory Visit (INDEPENDENT_AMBULATORY_CARE_PROVIDER_SITE_OTHER): Payer: Medicare HMO | Admitting: Family Medicine

## 2019-08-31 ENCOUNTER — Other Ambulatory Visit: Payer: Self-pay

## 2019-08-31 DIAGNOSIS — K30 Functional dyspepsia: Secondary | ICD-10-CM | POA: Insufficient documentation

## 2019-08-31 NOTE — Assessment & Plan Note (Addendum)
Has difficulty describing GI symptoms but overall reassuring exam without red flags. Anticipate manifestation of stress/nerves after husband's passing earlier this yearly. Support provided. Discussed option of medication to help - will avoid for now. Not affecting sleep. Reviewed healthy stress relieving strategies. Encouraged nutritious diet to avoid ongoing weight loss. Advised to notify us if ongoing weight loss noted. UTD preventative services.

## 2019-08-31 NOTE — Progress Notes (Signed)
This visit was conducted in person.  BP 122/74 (BP Location: Left Arm, Patient Position: Sitting, Cuff Size: Normal)   Pulse 90   Temp 97.9 F (36.6 C) (Temporal)   Ht 5\' 1"  (1.549 m)   Wt 133 lb 5 oz (60.5 kg)   SpO2 98%   BMI 25.19 kg/m    CC: GI upset  Subjective:    Patient ID: Marilyn Cook, female    DOB: [birth year], 74 y.o.   MRN: [id number]  HPI: Marilyn Cook is a 74 y.o. female presenting on 08/31/2019 for GI Problem (C/o stomach not "feeling right".  Cannot describe it.  Denies pain, N/V/D.  Thinks it is due to nerves. )   Some intermittent abdominal discomfort that is difficult to explain. No abd pain, nausea/vomiting, diarrhea/constipation, blood in stool, urinary changes, hematuria. No GER symptoms or dysphagia. Sleeping well.   11 lb weight loss noted over the past 6 months. Attributes to low appetite. Does enjoy soups.  Tough year. Husband suddenly passed 02/2019 (cardiac arrest). 65yo aunt has had memory troubles now in nursing home.  Son lives next door.   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      Relevant past medical, surgical, family and social history reviewed and updated as indicated. Interim medical history since our last visit reviewed. Allergies and medications reviewed and updated. Outpatient Medications Prior to Visit  Medication Sig Dispense Refill  . Ascorbic Acid (VITAMIN C PO) Take 1 capsule by mouth daily.    [name] atorvastatin (LIPITOR) 40 MG tablet Take 1 tablet (40 mg total) by mouth daily. 90 tablet 3  . Calcium Carb-Cholecalciferol (CALCIUM-VITAMIN D) 600-400 MG-UNIT TABS Take 1 tablet by mouth 2 (two) times daily.     . cetirizine (ZYRTEC) 10 MG tablet Take 10 mg by mouth daily.    . Cinnamon 500 MG capsule Take 1,000 mg by mouth 2 (two) times daily.     . clopidogrel (PLAVIX) 75 MG tablet Take 1 tablet (75 mg total) by mouth daily. 90 tablet 3  . co-enzyme Q-10 30 MG capsule Take 30 mg by mouth daily.    [name]  CRANBERRY PO Take 1 tablet by mouth daily.     . hydrocortisone 2.5 % cream Apply 1 application topically 2 (two) times daily as needed (itching).     . Misc Natural Products (BLACK CHERRY CONCENTRATE PO) Take 1 tablet by mouth daily.     . Potassium Chloride (K-TABS PO) Take 1 tablet by mouth daily.     . Pyridoxine HCl (VITAMIN B-6 PO) Take 1 tablet by mouth daily.    [name] triamcinolone (KENALOG) 0.147 MG/GM topical spray Apply topically 2 (two) times daily as needed. For psoriasis 63 g 1  . vitamin B-12 (CYANOCOBALAMIN) 1000 MCG tablet Take 1,000 mcg by mouth daily.     No facility-administered medications prior to visit.     Per HPI unless specifically indicated in ROS section below Review of Systems Objective:  BP 122/74 (BP Location: Left Arm, Patient Position: Sitting, Cuff Size: Normal)   Pulse 90   Temp 97.9 F (36.6 C) (Temporal)   Ht 5\' 1"  (1.549 m)   Wt 133 lb 5 oz (60.5 kg)   SpO2 98%   BMI 25.19 kg/m   Wt Readings from Last 3 Encounters:  08/31/19 133 lb 5 oz (60.5 kg)  02/17/19 144 lb 4 oz (65.4 kg)  02/05/18 144 lb 8 oz (65.5 kg)  Physical Exam Vitals and nursing note reviewed.  Constitutional:      Appearance: Normal appearance. She is not ill-appearing.  Cardiovascular:     Rate and Rhythm: Normal rate and regular rhythm.     Pulses: Normal pulses.     Heart sounds: Normal heart sounds. No murmur heard.   Pulmonary:     Effort: Pulmonary effort is normal. No respiratory distress.     Breath sounds: Normal breath sounds. No wheezing, rhonchi or rales.  Abdominal:     General: Abdomen is flat. Bowel sounds are normal. There is no distension.     Palpations: Abdomen is soft. There is no mass.     Tenderness: There is no abdominal tenderness. There is no right CVA tenderness, left CVA tenderness, guarding or rebound.     Hernia: No hernia is present.  Musculoskeletal:        General: Normal range of motion.     Right lower leg: No edema.     Left lower  leg: No edema.  Skin:    General: Skin is warm and dry.     Findings: No rash.  Neurological:     Mental Status: She is alert.  Psychiatric:        Mood and Affect: Mood normal.        Behavior: Behavior normal.       Results for orders placed or performed in visit on 06/29/19  HM MAMMOGRAPHY  Result Value Ref Range   HM Mammogram 0-4 Bi-Rad 0-4 Bi-Rad, Self Reported Normal   Depression screen [facility] 2/9 08/31/2019 02/14/2019 02/05/2018 02/04/2017 08/04/2012  Decreased Interest 0 0 0 2 0  Down, Depressed, Hopeless 1 0 0 2 0  PHQ - 2 Score 1 0 0 4 0  Altered sleeping 1 0 - 1 -  Tired, decreased energy 1 0 - 3 -  Change in appetite 1 0 - 2 -  Feeling bad or failure about yourself  0 0 - 2 -  Trouble concentrating 0 0 - 3 -  Moving slowly or fidgety/restless 0 0 - 1 -  Suicidal thoughts 0 0 - 0 -  PHQ-9 Score 4 0 - 16 -  Difficult doing work/chores - Not difficult at all - - -    GAD 7 : Generalized Anxiety Score 08/31/2019  Nervous, Anxious, on Edge 1  Control/stop worrying 1  Worry too much - different things 2  Trouble relaxing 1  Restless 0  Easily annoyed or irritable 1  Afraid - awful might happen 1  Total GAD 7 Score 7   Assessment & Plan:  This visit occurred during the SARS-CoV-2 public health emergency.  Safety protocols were in place, including screening questions prior to the visit, additional usage of staff PPE, and extensive cleaning of exam room while observing appropriate contact time as indicated for disinfecting solutions.  Over 30 minutes were spent face-to-face with the patient during this encounter and >50% of that time was spent on counseling and coordination of care  Problem List Items Addressed This Visit    Upset stomach    Has difficulty describing GI symptoms but overall reassuring exam without red flags. Anticipate manifestation of stress/nerves after husband's passing earlier this yearly. Support provided. Discussed option of medication to help - will  avoid for now. Not affecting sleep. Reviewed healthy stress relieving strategies. Encouraged nutritious diet to avoid ongoing weight loss. Advised to notify us if ongoing weight loss noted. UTD preventative services.  No orders of the defined types were placed in this encounter.  No orders of the defined types were placed in this encounter.   Patient instructions: Good to see you today.  I think part of GI symptoms may be manifestation of stress.  Let us know if not improving over time or if interest in medication to help symptoms.  In the meantime work on healthy stress relieving strategies like listening to music you enjoy, taking a bubble bath, going for a walk.   Follow up plan: Return if symptoms worsen or fail to improve.  Ria Bush, MD

## 2019-08-31 NOTE — Patient Instructions (Addendum)
Good to see you today.  I think part of GI symptoms may be manifestation of stress.  Let us know if not improving over time or if interest in medication to help symptoms.  In the meantime work on healthy stress relieving strategies like listening to music you enjoy, taking a bubble bath, going for a walk.   Managing Stress, Adult Feeling a certain amount of stress is normal. Stress helps our body and mind get ready to deal with the demands of life. Stress hormones can motivate you to do well at work and meet your responsibilities. However severe or long-lasting (chronic) stress can affect your mental and physical health. Chronic stress puts you at higher risk for anxiety, depression, and other health problems like digestive problems, muscle aches, heart disease, high blood pressure, and stroke. What are the causes? Common causes of stress include:  Demands from work, such as deadlines, feeling overworked, or having long hours.  Pressures at home, such as money issues, disagreements with a spouse, or parenting issues.  Pressures from major life changes, such as divorce, moving, loss of a loved one, or chronic illness. You may be at higher risk for stress-related problems if you do not get enough sleep, are in poor health, do not have emotional support, or have a mental health disorder like anxiety or depression. How to recognize stress Stress can make you:  Have trouble sleeping.  Feel sad, anxious, irritable, or overwhelmed.  Lose your appetite.  Overeat or want to eat unhealthy foods.  Want to use drugs or alcohol. Stress can also cause physical symptoms, such as:  Sore, tense muscles, especially in the shoulders and neck.  Headaches.  Trouble breathing.  A faster heart rate.  Stomach pain, nausea, or vomiting.  Diarrhea or constipation.  Trouble concentrating. Follow these instructions at home: Lifestyle  Identify the source of your stress and your reaction to it. See  a therapist who can help you change your reactions.  When there are stressful events: ? Talk about it with family, friends, or co-workers. ? Try to think realistically about stressful events and not ignore them or overreact. ? Try to find the positives in a stressful situation and not focus on the negatives. ? Cut back on responsibilities at work and home, if possible. Ask for help from friends or family members if you need it.  Find ways to cope with stress, such as: ? Meditation. ? Deep breathing. ? Yoga or tai chi. ? Progressive muscle relaxation. ? Doing art, playing music, or reading. ? Making time for fun activities. ? Spending time with family and friends.  Get support from family, friends, or spiritual resources. Eating and drinking  Eat a healthy diet. This includes: ? Eating foods that are high in fiber, such as beans, whole grains, and fresh fruits and vegetables. ? Limiting foods that are high in fat and processed sugars, such as fried and sweet foods.  Do not skip meals or overeat.  Drink enough fluid to keep your urine pale yellow. Alcohol use  Do not drink alcohol if: ? Your health care provider tells you not to drink. ? You are pregnant, may be pregnant, or are planning to become pregnant.  Drinking alcohol is a way some people try to ease their stress. This can be dangerous, so if you drink alcohol: ? Limit how much you use to:  0-1 drink a day for women.  0-2 drinks a day for men. ? Be aware of how much alcohol  is in your drink. In the [place] one drink equals one 12 oz bottle of beer (355 mL), one 5 oz glass of wine (148 mL), or one 1 oz glass of hard liquor (44 mL). Activity   Include 30 minutes of exercise in your daily schedule. Exercise is a good stress reducer.  Include time in your day for an activity that you find relaxing. Try taking a walk, going on a bike ride, reading a book, or listening to music.  Schedule your time in a way that lowers  stress, and keep a consistent schedule. Prioritize what is most important to get done. General instructions  Get enough sleep. Try to go to sleep and get up at about the same time every day.  Take over-the-counter and prescription medicines only as told by your health care provider.  Do not use any products that contain nicotine or tobacco, such as cigarettes, e-cigarettes, and chewing tobacco. If you need help quitting, ask your health care provider.  Do not use drugs or smoke to cope with stress.  Keep all follow-up visits as told by your health care provider. This is important. Where to find support  Talk with your health care provider about stress management or finding a support group.  Find a therapist to work with you on your stress management techniques. Contact a health care provider if:  Your stress symptoms get worse.  You are unable to manage your stress at home.  You are struggling to stop using drugs or alcohol. Get help right away if:  You may be a danger to yourself or others.  You have any thoughts of death or suicide. If you ever feel like you may hurt yourself or others, or have thoughts about taking your own life, get help right away. You can go to your nearest emergency department or call:  Your local emergency services (911 in the [place]  A suicide crisis helpline, such as the [place] at 475-224-3100. This is open 24 hours a day. Summary  Feeling a certain amount of stress is normal, but severe or long-lasting (chronic) stress can affect your mental and physical health.  Chronic stress can put you at higher risk for anxiety, depression, and other health problems like digestive problems, muscle aches, heart disease, high blood pressure, and stroke.  You may be at higher risk for stress-related problems if you do not get enough sleep, are in poor health, lack emotional support, or have a mental health disorder like anxiety or  depression.  Identify the source of your stress and your reaction to it. Try talking about stressful events with family, friends, or co-workers, finding a coping method, or getting support from spiritual resources.  If you need more help, talk with your health care provider about finding a support group or a mental health therapist. This information is not intended to replace advice given to you by your health care provider. Make sure you discuss any questions you have with your health care provider. Document Revised: 07/21/2018 Document Reviewed: 07/21/2018 Elsevier Patient Education  [employer].

## 2019-09-05 ENCOUNTER — Telehealth: Payer: Self-pay | Admitting: Family Medicine

## 2019-09-05 NOTE — Telephone Encounter (Signed)
Lvm asking pt's son, [name] (no dpr) to call back.  Need to schedule OV if he is concerned for pt's memory.  But we cannot discuss any of her health info past that due to no dpr on file.

## 2019-09-05 NOTE — Telephone Encounter (Signed)
Marilyn Cook (son) called would like dr g to call him regarding  Pt has be telling him things more than once in the same conversation.  He stated one of pt close friends mentioned this to [place] also  Marilyn Cook is concerned

## 2019-09-06 NOTE — Telephone Encounter (Signed)
Pt scheduled on 09/13/19 at 3:30.

## 2019-09-13 ENCOUNTER — Encounter: Payer: Self-pay | Admitting: Family Medicine

## 2019-09-13 ENCOUNTER — Other Ambulatory Visit: Payer: Self-pay

## 2019-09-13 ENCOUNTER — Ambulatory Visit (INDEPENDENT_AMBULATORY_CARE_PROVIDER_SITE_OTHER): Payer: Medicare HMO | Admitting: Family Medicine

## 2019-09-13 VITALS — BP 136/80 | HR 84 | Temp 98.0°F | Ht 61.0 in | Wt 135.0 lb

## 2019-09-13 DIAGNOSIS — G3184 Mild cognitive impairment, so stated: Secondary | ICD-10-CM

## 2019-09-13 DIAGNOSIS — R413 Other amnesia: Secondary | ICD-10-CM

## 2019-09-13 DIAGNOSIS — F0280 Dementia in other diseases classified elsewhere without behavioral disturbance: Secondary | ICD-10-CM | POA: Insufficient documentation

## 2019-09-13 DIAGNOSIS — Z23 Encounter for immunization: Secondary | ICD-10-CM

## 2019-09-13 MED ORDER — DONEPEZIL HCL 5 MG PO TABS: 5.0000 mg | ORAL_TABLET | Freq: Every day | ORAL | 3 refills | Status: DC

## 2019-09-13 NOTE — Progress Notes (Signed)
This visit was conducted in person.  BP 136/80 (BP Location: Right Arm, Patient Position: Sitting, Cuff Size: Normal)   Pulse 84   Temp 98 F (36.7 C) (Temporal)   Ht 5\' 1"  (1.549 m)   Wt 135 lb (61.2 kg)   SpO2 97%   BMI 25.51 kg/m   BP Readings from Last 3 Encounters:  09/13/19 136/80  08/31/19 122/74  02/17/19 134/82    CC: discuss memory Subjective:    Patient ID: Marilyn Cook, female    DOB: Jun 05, 1945, 74 y.o.   MRN: [id number]  HPI: Marilyn Cook is a 74 y.o. female presenting on 09/13/2019 for Memory Loss (Per pt's son, pt seems to repeat herself during coversations.  Wondering if any of the meds would be the cause.  Pt accomanied by son, [name]- temp 98.2.)   Difficult 12+ months - [place] husband suddenly passed away early this year.  Stress preparing upcoming high school reunion.  Son worried about mother's memory. He notes that she is repeating questions - can happen just 30 seconds after she asks a question.  Drives without trouble or concerns. Doesn't get lost.   She noticed some visual hallucinations right after husband passed away - matter floating across ceiling as well as husband's face. This has happened several times.   No tremors, behavioral changes.    Geriatric Assessment: Activities of Daily Living:     Bathing- independent    Dressing- independent     Eating- independent    Toileting- independent    Transferring- independent    Continence- independent Overall Assessment: independent  Instrumental Activities of Daily Living:     Transportation- independent    Meal/Food Preparation- independent    Shopping Errands- independent    Housekeeping/Chores- independent    Money Management/Finances- independent     Medication Management- independent     Ability to Use Telephone- independent    Laundry- independent Overall Assessment: independent   Mental Status Exam: 24/30 (value/max value)    Clock Drawing Score: 1/4      Relevant past  medical, surgical, family and social history reviewed and updated as indicated. Interim medical history since our last visit reviewed. Allergies and medications reviewed and updated. Outpatient Medications Prior to Visit  Medication Sig Dispense Refill  . Ascorbic Acid (VITAMIN C PO) Take 1 capsule by mouth daily.    [name] atorvastatin (LIPITOR) 40 MG tablet Take 1 tablet (40 mg total) by mouth daily. 90 tablet 3  . Calcium Carb-Cholecalciferol (CALCIUM-VITAMIN D) 600-400 MG-UNIT TABS Take 1 tablet by mouth 2 (two) times daily.     . cetirizine (ZYRTEC) 10 MG tablet Take 10 mg by mouth daily.    . Cinnamon 500 MG capsule Take 1,000 mg by mouth 2 (two) times daily.     . clopidogrel (PLAVIX) 75 MG tablet Take 1 tablet (75 mg total) by mouth daily. 90 tablet 3  . co-enzyme Q-10 30 MG capsule Take 30 mg by mouth daily.    [name] CRANBERRY PO Take 1 tablet by mouth daily.     . hydrocortisone 2.5 % cream Apply 1 application topically 2 (two) times daily as needed (itching).     . Misc Natural Products (BLACK CHERRY CONCENTRATE PO) Take 1 tablet by mouth daily.     . Potassium Chloride (K-TABS PO) Take 1 tablet by mouth daily.     . Pyridoxine HCl (VITAMIN B-6 PO) Take 1 tablet by mouth daily.    [name] triamcinolone (KENALOG) 0.147  MG/GM topical spray Apply topically 2 (two) times daily as needed. For psoriasis 63 g 1  . vitamin B-12 (CYANOCOBALAMIN) 1000 MCG tablet Take 1,000 mcg by mouth daily.     No facility-administered medications prior to visit.     Per HPI unless specifically indicated in ROS section below Review of Systems Objective:  BP 136/80 (BP Location: Right Arm, Patient Position: Sitting, Cuff Size: Normal)   Pulse 84   Temp 98 F (36.7 C) (Temporal)   Ht 5\' 1"  (1.549 m)   Wt 135 lb (61.2 kg)   SpO2 97%   BMI 25.51 kg/m   Wt Readings from Last 3 Encounters:  09/13/19 135 lb (61.2 kg)  08/31/19 133 lb 5 oz (60.5 kg)  02/17/19 144 lb 4 oz (65.4 kg)      Physical Exam Vitals and  nursing note reviewed.  Constitutional:      Appearance: Normal appearance. She is not ill-appearing.  Neurological:     Mental Status: She is alert.  Psychiatric:        Mood and Affect: Mood normal.        Behavior: Behavior normal.       Results for orders placed or performed in visit on 06/29/19  HM MAMMOGRAPHY  Result Value Ref Range   HM Mammogram 0-4 Bi-Rad 0-4 Bi-Rad, Self Reported Normal   Assessment & Plan:  This visit occurred during the SARS-CoV-2 public health emergency.  Safety protocols were in place, including screening questions prior to the visit, additional usage of staff PPE, and extensive cleaning of exam room while observing appropriate contact time as indicated for disinfecting solutions.   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 Scoring today (MMSE 24/30, CDT 1/4) consistent with at least MCI - however patient still functioning well while living home alone, continues driving. Reviewed this with patient and son, encouraged ongoing monitoring. Son will be placed as DPR. Encouraged memory maintaining strategies as per patient instructions. Discussed pros/cons of medication - will start aricept 5mg  daily, monitoring for worsening GERD or GI upset. Check labwork today for reversible causes of memory difficulty. Predominant difficulty with recall and language. Pt/son do endorse some visual hallucinations in setting of grief after husband's passing. ?early Alz vs LBD. Not consistent with PD or FTD or vascular dementia.        Other Visit Diagnoses    Need for influenza vaccination       Relevant Orders   Flu Vaccine QUAD High Dose(Fluad) (Completed)       Meds ordered this encounter  Medications  . donepezil (ARICEPT) 5 MG tablet    Sig: Take 1 tablet (5 mg total) by mouth at bedtime.    Dispense:  30 tablet     Refill:  3   Orders Placed This Encounter  Procedures  . Flu Vaccine QUAD High Dose(Fluad)  . Vitamin B12  . TSH  . CBC with Differential/Platelet  . Comprehensive metabolic panel  . RPR    Patient Instructions  Flu shot today  Memory testing was below what we would consider normal - raising question of memory trouble or "mild cognitive impairment".  I'd like to check labs today as well as start you on a medication called aricept - start 5mg  daily, watch for stomach upset on this medicine. Let me know if any  trouble with this.  Good to see you today, return in 1 month for follow up visit.  Continue healthy nutritious choices, regular exercise (cardio), using memory (reading, word puzzles), and social engagement.    Follow up plan: Return in about 4 weeks (around 10/11/2019), or if symptoms worsen or fail to improve, for follow up visit.  Ria Bush, MD

## 2019-09-13 NOTE — Patient Instructions (Addendum)
Flu shot today  Memory testing was below what we would consider normal - raising question of memory trouble or "mild cognitive impairment".  I'd like to check labs today as well as start you on a medication called aricept - start 5mg  daily, watch for stomach upset on this medicine. Let me know if any trouble with this.  Good to see you today, return in 1 month for follow up visit.  Continue healthy nutritious choices, regular exercise (cardio), using memory (reading, word puzzles), and social engagement.

## 2019-09-13 NOTE — Assessment & Plan Note (Addendum)
This has been a difficult year for patient after husband's sudden passing earlier this year. Scoring today (MMSE 24/30, CDT 1/4) consistent with at least MCI - however patient still functioning well while living home alone, continues driving. Reviewed this with patient and son, encouraged ongoing monitoring. Son will be placed as DPR. Encouraged memory maintaining strategies as per patient instructions. Discussed pros/cons of medication - will start aricept 5mg  daily, monitoring for worsening GERD or GI upset. Check labwork today for reversible causes of memory difficulty. Predominant difficulty with recall and language. Pt/son do endorse some visual hallucinations in setting of grief after husband's passing. ?early Alz vs LBD. Not consistent with PD or FTD or vascular dementia.

## 2019-09-14 LAB — CBC WITH DIFFERENTIAL/PLATELET
Basophils Absolute: 0 10*3/uL (ref 0.0–0.1)
Basophils Relative: 0.6 % (ref 0.0–3.0)
Eosinophils Absolute: 0.1 10*3/uL (ref 0.0–0.7)
Eosinophils Relative: 1.1 % (ref 0.0–5.0)
HCT: 39.6 % (ref 36.0–46.0)
Hemoglobin: 13.8 g/dL (ref 12.0–15.0)
Lymphocytes Relative: 26.8 % (ref 12.0–46.0)
Lymphs Abs: 1.5 10*3/uL (ref 0.7–4.0)
MCHC: 34.9 g/dL (ref 30.0–36.0)
MCV: 94.7 fl (ref 78.0–100.0)
Monocytes Absolute: 0.6 10*3/uL (ref 0.1–1.0)
Monocytes Relative: 11.1 % (ref 3.0–12.0)
Neutro Abs: 3.3 10*3/uL (ref 1.4–7.7)
Neutrophils Relative %: 60.4 % (ref 43.0–77.0)
Platelets: 211 10*3/uL (ref 150.0–400.0)
RBC: 4.18 Mil/uL (ref 3.87–5.11)
RDW: 13.3 % (ref 11.5–15.5)
WBC: 5.5 10*3/uL (ref 4.0–10.5)

## 2019-09-14 LAB — COMPREHENSIVE METABOLIC PANEL
ALT: 13 U/L (ref 0–35)
AST: 17 U/L (ref 0–37)
Albumin: 4.4 g/dL (ref 3.5–5.2)
Alkaline Phosphatase: 73 U/L (ref 39–117)
BUN: 8 mg/dL (ref 6–23)
CO2: 31 mEq/L (ref 19–32)
Calcium: 9 mg/dL (ref 8.4–10.5)
Chloride: 104 mEq/L (ref 96–112)
Creatinine, Ser: 1.03 mg/dL (ref 0.40–1.20)
GFR: 52.24 mL/min — ABNORMAL LOW (ref >60.00)
Glucose, Bld: 94 mg/dL (ref 70–99)
Potassium: 3.4 mEq/L — ABNORMAL LOW (ref 3.5–5.1)
Sodium: 142 mEq/L (ref 135–145)
Total Bilirubin: 0.5 mg/dL (ref 0.2–1.2)
Total Protein: 6.9 g/dL (ref 6.0–8.3)

## 2019-09-14 LAB — TSH: TSH: 8.51 u[IU]/mL — ABNORMAL HIGH (ref 0.35–4.50)

## 2019-09-14 LAB — VITAMIN B12: Vitamin B-12: 1358 pg/mL — ABNORMAL HIGH (ref 211–911)

## 2019-09-14 LAB — RPR: RPR Ser Ql: NONREACTIVE

## 2019-09-15 ENCOUNTER — Other Ambulatory Visit (INDEPENDENT_AMBULATORY_CARE_PROVIDER_SITE_OTHER): Payer: Medicare HMO

## 2019-09-15 ENCOUNTER — Other Ambulatory Visit: Payer: Self-pay | Admitting: Family Medicine

## 2019-09-15 DIAGNOSIS — R7989 Other specified abnormal findings of blood chemistry: Secondary | ICD-10-CM

## 2019-09-15 DIAGNOSIS — E039 Hypothyroidism, unspecified: Secondary | ICD-10-CM | POA: Insufficient documentation

## 2019-09-15 HISTORY — DX: Hypothyroidism, unspecified: E03.9

## 2019-09-15 LAB — T4, FREE: Free T4: 0.69 ng/dL (ref 0.60–1.60)

## 2019-09-15 MED ORDER — LEVOTHYROXINE SODIUM 25 MCG PO TABS: 25.0000 ug | ORAL_TABLET | Freq: Every day | ORAL | 6 refills | Status: DC

## 2019-09-15 MED ORDER — CYANOCOBALAMIN 500 MCG PO TABS: 500.0000 ug | ORAL_TABLET | ORAL | Status: DC

## 2019-09-16 ENCOUNTER — Other Ambulatory Visit: Payer: Self-pay | Admitting: Family Medicine

## 2019-09-19 ENCOUNTER — Telehealth: Payer: Self-pay | Admitting: *Deleted

## 2019-09-19 NOTE — Telephone Encounter (Signed)
Patient called stating that she was in last week and was given Donepezil and Levothyroxine. Patient stated that she took the Donepezil Saturday night and it made her feel worse. Patent statedt the medication made her stomach feel upset and caused a dull ache. Patient stated that she has not yet started her levothyroxine. Patient stated that her stomach felt really bad yesterday so she did not take the Doneperzil last night and her stomach feels better today. Patient wants to know what she should do about the medication making her stomach feel bad. Pharmacy [facility]

## 2019-09-19 NOTE — Telephone Encounter (Signed)
Spoke with pt relaying Dr. G's message. Pt verbalizes understanding.  

## 2019-09-19 NOTE — Telephone Encounter (Signed)
Don't take any more donepezil. Trial levothyroxine instead for the next few weeks and update [place] with effect.

## 2019-09-20 NOTE — Telephone Encounter (Addendum)
Pt called back to Triage a little confused about medications. She thought she was to stop the Aricept and the synthroid, and that Dr. Darnell Level was going to send in a new medication. I advise her of Dr. Synthia Innocent comments again regarding to stop the Aricept and to keep taking the synthroid for a few weeks and update [place] on how she feels, and that no new med for memory was sent in to pharmacy. Pt verbalized understanding. She will update PCP in a few weeks.   FYI to PCP

## 2019-10-18 ENCOUNTER — Encounter: Payer: Self-pay | Admitting: Family Medicine

## 2019-10-18 ENCOUNTER — Ambulatory Visit (INDEPENDENT_AMBULATORY_CARE_PROVIDER_SITE_OTHER): Payer: Medicare HMO | Admitting: Family Medicine

## 2019-10-18 ENCOUNTER — Other Ambulatory Visit: Payer: Self-pay

## 2019-10-18 VITALS — BP 134/80 | HR 92 | Temp 98.1°F | Ht 61.0 in | Wt 129.3 lb

## 2019-10-18 DIAGNOSIS — E039 Hypothyroidism, unspecified: Secondary | ICD-10-CM

## 2019-10-18 DIAGNOSIS — R7989 Other specified abnormal findings of blood chemistry: Secondary | ICD-10-CM | POA: Diagnosis not present

## 2019-10-18 DIAGNOSIS — N289 Disorder of kidney and ureter, unspecified: Secondary | ICD-10-CM | POA: Insufficient documentation

## 2019-10-18 DIAGNOSIS — G3184 Mild cognitive impairment, so stated: Secondary | ICD-10-CM

## 2019-10-18 DIAGNOSIS — R634 Abnormal weight loss: Secondary | ICD-10-CM | POA: Diagnosis not present

## 2019-10-18 HISTORY — DX: Disorder of kidney and ureter, unspecified: N28.9

## 2019-10-18 NOTE — Patient Instructions (Addendum)
Labs today.  Goal 3 meals a day. Consider adding ensure or boost supplement.  Continue levothyroxine 79mcg daily. Let us know when you need refill.  Return in 3 months for follow up visit.

## 2019-10-18 NOTE — Progress Notes (Signed)
This visit was conducted in person.  BP 134/80 (BP Location: Left Arm, Patient Position: Sitting, Cuff Size: Normal)   Pulse 92   Temp 98.1 F (36.7 C) (Temporal)   Ht 5\' 1"  (1.549 m)   Wt 129 lb 5 oz (58.7 kg)   SpO2 98%   BMI 24.43 kg/m    CC: 61mo f/u visit  Subjective:    Patient ID: Marilyn Cook, female    DOB: 05-Feb-1945, 74 y.o.   MRN: [id number]  HPI: Marilyn Cook is a 74 y.o. female presenting on 10/18/2019 for Memory Loss (Here for 6 wk MCI f/u.  Pt accompanied by son, [name]- temp 98.5.)   See prior note for details.  Seen here 09/13/2019 with son with concerns over worsening memory. She also endorsed some visual hallucinations right after husband's passing. No tremors or behavior changes.   MMSE at that time 24/30, clock drawing score 1/4, but independent in ADLs and IADLs. Predominant difficulty with recall and language. ?early Alz vs LBD vs situational memory trouble (husband's death, stress over preparing class reunion).   Aricept trial caused GI upset, dull stomach ache so she stopped this.  Concomitantly found to have low thyroid with TSH 8.51, free T4 0.69 (0.6-1.6) - levothyroxine 9mcg daily was started.   Planning to get involved with [employer] card game group.   Weight loss noted. Never had robust appetite, averaging 2 meals/day.  Renal function bumped from 70s GFR to 50s over last few months.      Relevant past medical, surgical, family and social history reviewed and updated as indicated. Interim medical history since our last visit reviewed. Allergies and medications reviewed and updated. Outpatient Medications Prior to Visit  Medication Sig Dispense Refill  . Ascorbic Acid (VITAMIN C PO) Take 1 capsule by mouth daily.    [name] atorvastatin (LIPITOR) 40 MG tablet Take 1 tablet (40 mg total) by mouth daily. 90 tablet 3  . Calcium Carb-Cholecalciferol (CALCIUM-VITAMIN D) 600-400 MG-UNIT TABS Take 1 tablet by mouth 2 (two) times daily.     . cetirizine  (ZYRTEC) 10 MG tablet Take 10 mg by mouth daily.    . Cinnamon 500 MG capsule Take 1,000 mg by mouth 2 (two) times daily.     . clopidogrel (PLAVIX) 75 MG tablet Take 1 tablet (75 mg total) by mouth daily. 90 tablet 3  . co-enzyme Q-10 30 MG capsule Take 30 mg by mouth daily.    [name] CRANBERRY PO Take 1 tablet by mouth daily.     . hydrocortisone 2.5 % cream Apply 1 application topically 2 (two) times daily as needed (itching).     [name] levothyroxine (SYNTHROID) 25 MCG tablet Take 1 tablet (25 mcg total) by mouth daily before breakfast. 30 tablet 6  . Misc Natural Products (BLACK CHERRY CONCENTRATE PO) Take 1 tablet by mouth daily.     . Potassium Chloride (K-TABS PO) Take 1 tablet by mouth daily.     . Pyridoxine HCl (VITAMIN B-6 PO) Take 1 tablet by mouth daily.    [name] triamcinolone (KENALOG) 0.147 MG/GM topical spray Apply topically 2 (two) times daily as needed. For psoriasis 63 g 1  . vitamin B-12 (CYANOCOBALAMIN) 500 MCG tablet Take 1 tablet (500 mcg total) by mouth every Monday, Wednesday, and Friday.     No facility-administered medications prior to visit.     Per HPI unless specifically indicated in ROS section below Review of Systems Objective:  BP 134/80 (BP Location: Left Arm,  Patient Position: Sitting, Cuff Size: Normal)   Pulse 92   Temp 98.1 F (36.7 C) (Temporal)   Ht 5\' 1"  (1.549 m)   Wt 129 lb 5 oz (58.7 kg)   SpO2 98%   BMI 24.43 kg/m   Wt Readings from Last 3 Encounters:  10/18/19 129 lb 5 oz (58.7 kg)  09/13/19 135 lb (61.2 kg)  08/31/19 133 lb 5 oz (60.5 kg)      Physical Exam Vitals and nursing note reviewed.  Constitutional:      Appearance: Normal appearance. She is not ill-appearing.  Neurological:     Mental Status: She is alert.  Psychiatric:        Mood and Affect: Mood normal.        Behavior: Behavior normal.       Results for orders placed or performed in visit on 09/15/19  T4, free  Result Value Ref Range   Free T4 0.69 0.60 - 1.60 ng/dL    Assessment & Plan:  This visit occurred during the SARS-CoV-2 public health emergency.  Safety protocols were in place, including screening questions prior to the visit, additional usage of staff PPE, and extensive cleaning of exam room while observing appropriate contact time as indicated for disinfecting solutions.   Problem List Items Addressed This Visit    Weight loss    6 lb weight loss noted again today - anticipate related to poor PO intake. Reviewed importance of nutrition and goal for 3 meals/day, with protein shake supplement as needed. If ongoing weight loss or progressive renal impairment, low threshold to image with abd [place].       Renal insufficiency    Noted 20 point drop in GFR over last few months - update BMP. 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  Did not tolerate aricept. Seems to be doing better on low dose levothyroxine - see below.  Discussed options of namenda trial vs neurology evaluation vs watching for now.  Discussed social engagement and importance of nutrition - recommend 3 meals a day, discussed adding protein shake if she skips a meal. She is also making effort to join [employer] card game group.       Borderline hypothyroidism    Tolerating low dose levothyroxine 62mcg well this past month. Update TFTs. Continue this ?improvement in memory on it.       Relevant Orders   TSH   T4, free       No orders of the defined types were placed in this encounter.  Orders Placed This Encounter  Procedures  . TSH  . T4, free  . Renal function panel    Patient Instructions  Labs today.  Goal 3 meals a day. Consider adding ensure or boost supplement.  Continue levothyroxine 59mcg daily. Let us know when you need refill.  Return in 3 months for follow up visit.   Follow up plan: Return  in about 3 months (around 01/18/2020) for follow up visit.  Ria Bush, MD

## 2019-10-18 NOTE — Assessment & Plan Note (Addendum)
Noted 20 point drop in GFR over last few months - update BMP. If persists, consider renal US

## 2019-10-18 NOTE — Assessment & Plan Note (Signed)
6 lb weight loss noted again today - anticipate related to poor PO intake. Reviewed importance of nutrition and goal for 3 meals/day, with protein shake supplement as needed. If ongoing weight loss or progressive renal impairment, low threshold to image with abd [place].

## 2019-10-18 NOTE — Assessment & Plan Note (Signed)
Based on testing from last year - ?situational vs early Alz or LBD.  Did not tolerate aricept. Seems to be doing better on low dose levothyroxine - see below.  Discussed options of namenda trial vs neurology evaluation vs watching for now.  Discussed social engagement and importance of nutrition - recommend 3 meals a day, discussed adding protein shake if she skips a meal. She is also making effort to join [employer] card game group.

## 2019-10-18 NOTE — Assessment & Plan Note (Signed)
Tolerating low dose levothyroxine 80mcg well this past month. Update TFTs. Continue this ?improvement in memory on it.

## 2019-10-19 LAB — RENAL FUNCTION PANEL
Albumin: 4.3 g/dL (ref 3.5–5.2)
BUN: 9 mg/dL (ref 6–23)
CO2: 30 mEq/L (ref 19–32)
Calcium: 8.9 mg/dL (ref 8.4–10.5)
Chloride: 104 mEq/L (ref 96–112)
Creatinine, Ser: 0.78 mg/dL (ref 0.40–1.20)
GFR: 74.33 mL/min (ref >60.00)
Glucose, Bld: 82 mg/dL (ref 70–99)
Phosphorus: 3.6 mg/dL (ref 2.3–4.6)
Potassium: 3.6 mEq/L (ref 3.5–5.1)
Sodium: 142 mEq/L (ref 135–145)

## 2019-10-19 LAB — T4, FREE: Free T4: 0.79 ng/dL (ref 0.60–1.60)

## 2019-10-19 LAB — TSH: TSH: 4.78 u[IU]/mL — ABNORMAL HIGH (ref 0.35–4.50)

## 2019-10-20 ENCOUNTER — Telehealth: Payer: Self-pay

## 2019-10-20 NOTE — Telephone Encounter (Signed)
Attempted to contact pt.  No answer.  No vm.  Need to relay results and Dr. Synthia Innocent message.  Labs & msg: Your kidney function is improved - continue good water intake.  Your thyroid levels are improved - recommend continue levothyroxine 62mcg daily at this time.  Better readings overall!

## 2019-10-21 NOTE — Telephone Encounter (Signed)
Pt returning call.  Relayed results and Dr. Synthia Innocent message.  Pt verbalizes understanding.

## 2019-11-24 ENCOUNTER — Telehealth: Payer: Self-pay | Admitting: Family Medicine

## 2019-11-24 NOTE — Telephone Encounter (Signed)
Patient stopped by office and is inquiring about taking covid booster. Wants to know if PCP thinks t is a good idea. Please advise.

## 2019-11-24 NOTE — Telephone Encounter (Signed)
Pt returning call.  I relayed Dr. Synthia Innocent message.  Pt verbalizes understanding.

## 2019-11-24 NOTE — Telephone Encounter (Signed)
Attempted to contact pt.  No answer.  No vm.  Need to relay Dr. G's message.  

## 2019-11-24 NOTE — Telephone Encounter (Signed)
I think it would be reasonable for her to get COVID booster.

## 2019-11-30 ENCOUNTER — Other Ambulatory Visit: Payer: Self-pay | Admitting: Family Medicine

## 2019-12-13 ENCOUNTER — Telehealth: Payer: Self-pay

## 2019-12-13 NOTE — Telephone Encounter (Signed)
Noted. Thanks.

## 2019-12-13 NOTE — Telephone Encounter (Signed)
Pt came to clinic today wanting to know if it is ok to get her covid booster if she is taking levothyroxine. Advised pt it is ok to get the vaccine. Pt said she would get it at the pharmacy.

## 2020-01-15 ENCOUNTER — Other Ambulatory Visit: Payer: Self-pay | Admitting: Family Medicine

## 2020-01-18 ENCOUNTER — Ambulatory Visit: Payer: Medicare HMO | Admitting: Family Medicine

## 2020-01-18 ENCOUNTER — Telehealth: Payer: Self-pay

## 2020-01-18 DIAGNOSIS — Z0289 Encounter for other administrative examinations: Secondary | ICD-10-CM

## 2020-01-18 NOTE — Telephone Encounter (Signed)
Pt said she didn't know she had an appointment today and doesn't want to be charged because she didn't know she had the appointment. I let her know that I would have to send this to Dr. Darnell Level to decide on if she will be charged the no show fee for missed appointment.

## 2020-01-18 NOTE — Telephone Encounter (Signed)
Ok to not charge 

## 2020-01-19 NOTE — Telephone Encounter (Signed)
Noted in system EM

## 2020-02-17 ENCOUNTER — Ambulatory Visit (INDEPENDENT_AMBULATORY_CARE_PROVIDER_SITE_OTHER): Payer: Medicare HMO

## 2020-02-17 ENCOUNTER — Other Ambulatory Visit: Payer: Self-pay

## 2020-02-17 DIAGNOSIS — Z0000 Encounter for general adult medical examination without abnormal findings: Secondary | ICD-10-CM | POA: Diagnosis not present

## 2020-02-17 NOTE — Patient Instructions (Signed)
Marilyn Cook , Thank you for taking time to come for your Medicare Wellness Visit. I appreciate your ongoing commitment to your health goals. Please review the following plan we discussed and let me know if I can assist you in the future.   Screening recommendations/referrals: Colonoscopy: Up to date, completed 04/17/2015, due 04/2025 Mammogram: Up to date, completed 06/27/2019, due 06/2020 Bone Density: due, will discuss with provider at physical  Recommended yearly ophthalmology/optometry visit for glaucoma screening and checkup Recommended yearly dental visit for hygiene and checkup  Vaccinations: Influenza vaccine: Up to date, completed 09/13/2019, due 08/2020 Pneumococcal vaccine: Completed series Tdap vaccine: Up to date, completed 07/31/2011, due 07/2021 Shingles vaccine: Completed series   Covid-19: 2 doses completed, booster due   Advanced directives: copy in chart  Conditions/risks identified: hyperlipidemia   Next appointment: Follow up in one year for your annual wellness visit    Preventive Care 29 Years and Older, Female Preventive care refers to lifestyle choices and visits with your health care provider that can promote health and wellness. What does preventive care include?  A yearly physical exam. This is also called an annual well check.  Dental exams once or twice a year.  Routine eye exams. Ask your health care provider how often you should have your eyes checked.  Personal lifestyle choices, including:  Daily care of your teeth and gums.  Regular physical activity.  Eating a healthy diet.  Avoiding tobacco and drug use.  Limiting alcohol use.  Practicing safe sex.  Taking low-dose aspirin every day.  Taking vitamin and mineral supplements as recommended by your health care provider. What happens during an annual well check? The services and screenings done by your health care provider during your annual well check will depend on your age, overall  health, lifestyle risk factors, and family history of disease. Counseling  Your health care provider may ask you questions about your:  Alcohol use.  Tobacco use.  Drug use.  Emotional well-being.  Home and relationship well-being.  Sexual activity.  Eating habits.  History of falls.  Memory and ability to understand (cognition).  Work and work Statistician.  Reproductive health. Screening  You may have the following tests or measurements:  Height, weight, and BMI.  Blood pressure.  Lipid and cholesterol levels. These may be checked every 5 years, or more frequently if you are over 74 years old.  Skin check.  Lung cancer screening. You may have this screening every year starting at age 74 if you have a 30-pack-year history of smoking and currently smoke or have quit within the past 15 years.  Fecal occult blood test (FOBT) of the stool. You may have this test every year starting at age 59.  Flexible sigmoidoscopy or colonoscopy. You may have a sigmoidoscopy every 5 years or a colonoscopy every 10 years starting at age 9.  Hepatitis C blood test.  Hepatitis B blood test.  Sexually transmitted disease (STD) testing.  Diabetes screening. This is done by checking your blood sugar (glucose) after you have not eaten for a while (fasting). You may have this done every 1-3 years.  Bone density scan. This is done to screen for osteoporosis. You may have this done starting at age 43.  Mammogram. This may be done every 1-2 years. Talk to your health care provider about how often you should have regular mammograms. Talk with your health care provider about your test results, treatment options, and if necessary, the need for more tests. Vaccines  Your health care provider may recommend certain vaccines, such as:  Influenza vaccine. This is recommended every year.  Tetanus, diphtheria, and acellular pertussis (Tdap, Td) vaccine. You may need a Td booster every 10  years.  Zoster vaccine. You may need this after age 65.  Pneumococcal 13-valent conjugate (PCV13) vaccine. One dose is recommended after age 14.  Pneumococcal polysaccharide (PPSV23) vaccine. One dose is recommended after age 45. Talk to your health care provider about which screenings and vaccines you need and how often you need them. This information is not intended to replace advice given to you by your health care provider. Make sure you discuss any questions you have with your health care provider. Document Released: 01/19/2015 Document Revised: 09/12/2015 Document Reviewed: 10/24/2014 Elsevier Interactive Patient Education  2017 [place] Prevention in the Home Falls can cause injuries. They can happen to people of all ages. There are many things you can do to make your home safe and to help prevent falls. What can I do on the outside of my home?  Regularly fix the edges of walkways and driveways and fix any cracks.  Remove anything that might make you trip as you walk through a door, such as a raised step or threshold.  Trim any bushes or trees on the path to your home.  Use bright outdoor lighting.  Clear any walking paths of anything that might make someone trip, such as rocks or tools.  Regularly check to see if handrails are loose or broken. Make sure that both sides of any steps have handrails.  Any raised decks and porches should have guardrails on the edges.  Have any leaves, snow, or ice cleared regularly.  Use sand or salt on walking paths during winter.  Clean up any spills in your garage right away. This includes oil or grease spills. What can I do in the bathroom?  Use night lights.  Install grab bars by the toilet and in the tub and shower. Do not use towel bars as grab bars.  Use non-skid mats or decals in the tub or shower.  If you need to sit down in the shower, use a plastic, non-slip stool.  Keep the floor dry. Clean up any water that  spills on the floor as soon as it happens.  Remove soap buildup in the tub or shower regularly.  Attach bath mats securely with double-sided non-slip rug tape.  Do not have throw rugs and other things on the floor that can make you trip. What can I do in the bedroom?  Use night lights.  Make sure that you have a light by your bed that is easy to reach.  Do not use any sheets or blankets that are too big for your bed. They should not hang down onto the floor.  Have a firm chair that has side arms. You can use this for support while you get dressed.  Do not have throw rugs and other things on the floor that can make you trip. What can I do in the kitchen?  Clean up any spills right away.  Avoid walking on wet floors.  Keep items that you use a lot in easy-to-reach places.  If you need to reach something above you, use a strong step stool that has a grab bar.  Keep electrical cords out of the way.  Do not use floor polish or wax that makes floors slippery. If you must use wax, use non-skid floor wax.  Do  not have throw rugs and other things on the floor that can make you trip. What can I do with my stairs?  Do not leave any items on the stairs.  Make sure that there are handrails on both sides of the stairs and use them. Fix handrails that are broken or loose. Make sure that handrails are as long as the stairways.  Check any carpeting to make sure that it is firmly attached to the stairs. Fix any carpet that is loose or worn.  Avoid having throw rugs at the top or bottom of the stairs. If you do have throw rugs, attach them to the floor with carpet tape.  Make sure that you have a light switch at the top of the stairs and the bottom of the stairs. If you do not have them, ask someone to add them for you. What else can I do to help prevent falls?  Wear shoes that:  Do not have high heels.  Have rubber bottoms.  Are comfortable and fit you well.  Are closed at the  toe. Do not wear sandals.  If you use a stepladder:  Make sure that it is fully opened. Do not climb a closed stepladder.  Make sure that both sides of the stepladder are locked into place.  Ask someone to hold it for you, if possible.  Clearly mark and make sure that you can see:  Any grab bars or handrails.  First and last steps.  Where the edge of each step is.  Use tools that help you move around (mobility aids) if they are needed. These include:  Canes.  Walkers.  Scooters.  Crutches.  Turn on the lights when you go into a dark area. Replace any light bulbs as soon as they burn out.  Set up your furniture so you have a clear path. Avoid moving your furniture around.  If any of your floors are uneven, fix them.  If there are any pets around you, be aware of where they are.  Review your medicines with your doctor. Some medicines can make you feel dizzy. This can increase your chance of falling. Ask your doctor what other things that you can do to help prevent falls. This information is not intended to replace advice given to you by your health care provider. Make sure you discuss any questions you have with your health care provider. Document Released: 10/19/2008 Document Revised: 05/31/2015 Document Reviewed: 01/27/2014 Elsevier Interactive Patient Education  2017 [employer].

## 2020-02-17 NOTE — Progress Notes (Signed)
Subjective:   Marilyn Cook is a 75 y.o. female who presents for Medicare Annual (Subsequent) preventive examination.  Review of Systems: N/A      I connected with the patient today by telephone and verified that I am speaking with the correct person using two identifiers. Location patient: home Location nurse: work Persons participating in the telephone visit: patient, nurse.   I discussed the limitations, risks, security and privacy concerns of performing an evaluation and management service by telephone and the availability of in person appointments. I also discussed with the patient that there may be a patient responsible charge related to this service. The patient expressed understanding and verbally consented to this telephonic visit.        Cardiac Risk Factors include: advanced age (>16men, >79 women);Other (see comment), Risk factor comments: hyperlipidemia     Objective:    Today's Vitals   There is no height or weight on file to calculate BMI.  Advanced Directives 02/17/2020 02/14/2019 11/14/2014 10/30/2014 10/23/2014 06/26/2014  Does Patient Have a Medical Advance Directive? Yes Yes No No Yes No  Type of Paramedic of [place];Living will [place];Living will - - [place];Living will -  Copy of [place] in Chart? Yes - validated most recent copy scanned in chart (See row information) Yes - validated most recent copy scanned in chart (See row information) - - - -  Would patient like information on creating a medical advance directive? - - Yes - Educational materials given - - -    Current Medications (verified) Outpatient Encounter Medications as of 02/17/2020  Medication Sig  . Ascorbic Acid (VITAMIN C PO) Take 1 capsule by mouth daily.  [name] atorvastatin (LIPITOR) 40 MG tablet TAKE 1 TABLET BY MOUTH EVERY DAY  . Calcium Carb-Cholecalciferol (CALCIUM-VITAMIN D) 600-400 MG-UNIT TABS Take 1  tablet by mouth 2 (two) times daily.   . cetirizine (ZYRTEC) 10 MG tablet Take 10 mg by mouth daily.  . Cinnamon 500 MG capsule Take 1,000 mg by mouth 2 (two) times daily.  . clopidogrel (PLAVIX) 75 MG tablet TAKE 1 TABLET BY MOUTH EVERY DAY  . co-enzyme Q-10 30 MG capsule Take 30 mg by mouth daily.  [name] CRANBERRY PO Take 1 tablet by mouth daily.  . hydrocortisone 2.5 % cream Apply 1 application topically 2 (two) times daily as needed (itching).  [name] levothyroxine (SYNTHROID) 25 MCG tablet Take 1 tablet (25 mcg total) by mouth daily before breakfast.  . Misc Natural Products (BLACK CHERRY CONCENTRATE PO) Take 1 tablet by mouth daily.   . Potassium Chloride (K-TABS PO) Take 1 tablet by mouth daily.  . Pyridoxine HCl (VITAMIN B-6 PO) Take 1 tablet by mouth daily.  [name] triamcinolone (KENALOG) 0.147 MG/GM topical spray Apply topically 2 (two) times daily as needed. For psoriasis  . vitamin B-12 (CYANOCOBALAMIN) 500 MCG tablet Take 1 tablet (500 mcg total) by mouth every Monday, Wednesday, and Friday.   No facility-administered encounter medications on file as of 02/17/2020.    Allergies (verified) Aspirin, Diphenhydramine hcl, Donepezil, Eggs or egg-derived products, Epinephrine, Latex, Other, Penicillins, and Sulfasalazine   History: Past Medical History:  Diagnosis Date  . Benign tumor of breast 1975  . Complication of anesthesia    hard to wake up  . Hyperlipemia   . NSVD (normal spontaneous vaginal delivery)    x 1  . Osteopenia 08/2012   spine WNL, hip T -1.2  . Scalp  psoriasis   . Stroke [facility])    possible TIA in 06/2014  . TIA (transient ischemic attack) 06/08/2014   Past Surgical History:  Procedure Laterality Date  . ABDOMINAL HYSTERECTOMY    . COLONOSCOPY  04/2015   diverticulosis, int hem, rpt 10 yrs ([place])  . DEXA  08/2012   spine WNL, hip T -1.2  . PARTIAL HYSTERECTOMY  1987   due to fibroids, ovaries remain  . R Lat Epicondyle Injections  1998  . SHOULDER ARTHROSCOPY  Right 11/2014   arthroscopic debridement and mini open rotator cuff repair of R shoulder for chronic impingement and rotator cuff tear by MRI [facility])  . Spiral CT  04/02   (-)   Family History  Problem Relation Age of Onset  . Heart disease Father        MI   . Stroke Father        CVA  . Stroke Mother        CVA  . Hypertension Sister   . Cancer Sister        ovarian and colon cancer  . Aneurysm Brother   . Hypertension Brother   . Hypertension Brother   . Cancer Paternal Aunt        ? colon  . Cancer Paternal Grandfather        liver, alcohol  . Alcohol abuse Paternal Grandfather   . Cancer Other        prostate  . Cancer Maternal Aunt        breast   Social History   Socioeconomic History  . Marital status: Married    Spouse name: Not on file  . Number of children: 1  . Years of education: Not on file  . Highest education level: Not on file  Occupational History  . Occupation: HR    Employer: [employer]    Comment: new hires  Tobacco Use  . Smoking status: Never Smoker  . Smokeless tobacco: Never Used  Substance and Sexual Activity  . Alcohol use: No    Alcohol/week: 0.0 standard drinks  . Drug use: No  . Sexual activity: Yes  Other Topics Concern  . Not on file  Social History Narrative   Widow - husband Patrick Jupiter died from cardiac arrest 03-23-2019   One son   Occupation: retired from working at [employer] at [employer]   Activity: no regular exercise   Diet: some water, fruits/vegetables daily      Advanced directives: discussed - HCPOA unsure will consider this. Needs to set up.   Social Determinants of Health   Financial Resource Strain: Low Risk   . Difficulty of Paying Living Expenses: Not hard at all  Food Insecurity: No Food Insecurity  . Worried About Charity fundraiser in the Last Year: Never true  . Ran Out of Food in the Last Year: Never true  Transportation Needs: No Transportation Needs  . Lack of Transportation (Medical): No  . Lack of  Transportation (Non-Medical): No  Physical Activity: Inactive  . Days of Exercise per Week: 0 days  . Minutes of Exercise per Session: 0 min  Stress: No Stress Concern Present  . Feeling of Stress : Not at all  Social Connections: Not on file    Tobacco Counseling Counseling given: Not Answered   Clinical Intake:  Pre-visit preparation completed: Yes  Pain : No/denies pain     Nutritional Risks: None Diabetes: No  How often do you need to have someone  help you when you read instructions, pamphlets, or other written materials from your doctor or pharmacy?: 1 - Never  Diabetic: No Nutrition Risk Assessment:  Has the patient had any N/V/D within the last 2 months?  No  Does the patient have any non-healing wounds?  No  Has the patient had any unintentional weight loss or weight gain?  No   Diabetes:  Is the patient diabetic?  No  If diabetic, was a CBG obtained today?  N/A Did the patient bring in their glucometer from home?  N/A How often do you monitor your CBG's? N/A.   Financial Strains and Diabetes Management:  Are you having any financial strains with the device, your supplies or your medication? N/A.  Does the patient want to be seen by Chronic Care Management for management of their diabetes?  N/A Would the patient like to be referred to a Nutritionist or for Diabetic Management?  N/A    Interpreter Needed?: No  Information entered by :: CJohnson, LPN   Activities of Daily Living In your present state of health, do you have any difficulty performing the following activities: 02/17/2020  Hearing? N  Vision? N  Difficulty concentrating or making decisions? Y  Comment some memory issues noted  Walking or climbing stairs? N  Dressing or bathing? N  Doing errands, shopping? N  Preparing Food and eating ? N  Using the Toilet? N  In the past six months, have you accidently leaked urine? N  Do you have problems with loss of bowel control? N  Managing your  Medications? N  Managing your Finances? N  Housekeeping or managing your Housekeeping? N  Some recent data might be hidden    Patient Care Team: Ria Bush, MD as PCP - General (Family Medicine)  Indicate any recent Medical Services you may have received from other than [facility] providers in the past year (date may be approximate).     Assessment:   This is a routine wellness examination for [facility].  Hearing/Vision screen  Hearing Screening   125Hz  250Hz  500Hz  1000Hz  2000Hz  3000Hz  4000Hz  6000Hz  8000Hz   Right ear:           Left ear:           Vision Screening Comments: Patient gets annual eye exams   Dietary issues and exercise activities discussed: Current Exercise Habits: The patient does not participate in regular exercise at present, Exercise limited by: None identified  Goals    . Patient Stated     02/14/2019, I will maintain and continue medications as prescribed.    . Patient Stated     02/17/2020, I will continue medications as prescribed.      Depression Screen PHQ 2/9 Scores 02/17/2020 08/31/2019 02/14/2019 02/05/2018 02/04/2017 08/04/2012  PHQ - 2 Score 0 1 0 0 4 0  PHQ- 9 Score 0 4 0 - 16 -    Fall Risk Fall Risk  02/17/2020 02/14/2019 02/05/2018 02/02/2015 08/04/2012  Falls in the past year? 1 1 0 No No  Comment - tripped and fell - - -  Number falls in past yr: 0 1 - - -  Injury with Fall? 0 0 - - -  Risk for fall due to : No Fall Risks Medication side effect - - -  Follow up Falls evaluation completed;Falls prevention discussed Falls evaluation completed;Falls prevention discussed - - -    FALL RISK PREVENTION PERTAINING TO THE HOME:  Any stairs in or around the home? Yes  If  so, are there any without handrails? No  Home free of loose throw rugs in walkways, pet beds, electrical cords, etc? Yes  Adequate lighting in your home to reduce risk of falls? Yes   ASSISTIVE DEVICES UTILIZED TO PREVENT FALLS:  Life alert? No  Use of a cane, walker or w/c? No  Grab  bars in the bathroom? No  Shower chair or bench in shower? No  Elevated toilet seat or a handicapped toilet? No   TIMED UP AND GO:  Was the test performed? N/A telephone visit .    Cognitive Function: MMSE - Mini Mental State Exam 02/17/2020 02/14/2019  Not completed: Unable to complete -  Orientation to time - 5  Orientation to Place - 5  Registration - 3  Attention/ Calculation - 5  Recall - 3  Language- repeat - 1  Mini Cog  Mini-Cog screen was not completed. Did not have time. Maximum score is 22. A value of 0 denotes this part of the MMSE was not completed or the patient failed this part of the Mini-Cog screening.       Immunizations Immunization History  Administered Date(s) Administered  . Fluad Quad(high Dose 65+) 10/07/2018, 09/13/2019  . Influenza Inj Mdck Quad Pf 10/04/2017  . Influenza,inj,Quad PF,6+ Mos 10/08/2015  . Influenza-Unspecified 09/07/2014, 10/04/2016  . PFIZER(Purple Top)SARS-COV-2 Vaccination 01/25/2019, 02/12/2019  . Pneumococcal Conjugate-13 01/23/2015  . Pneumococcal Polysaccharide-23 07/31/2011  . Td 10/12/2000  . Tdap 07/31/2011  . Zoster 11/23/2015  . Zoster Recombinat (Shingrix) 07/21/2017, 11/07/2017    TDAP status: Up to date  Flu Vaccine status: Up to date  Pneumococcal vaccine status: Up to date  Covid-19 vaccine status: 2 doses completed, Booster due. Patient aware.  Qualifies for Shingles Vaccine? Yes   Zostavax completed Yes   Shingrix Completed?: Yes  Screening Tests Health Maintenance  Topic Date Due  . COVID-19 Vaccine (3 - Booster for Pfizer series) 08/12/2019  . MAMMOGRAM  06/26/2020  . TETANUS/TDAP  07/30/2021  . COLONOSCOPY (Pts 45-38yrs Insurance coverage will need to be confirmed)  04/16/2025  . INFLUENZA VACCINE  Completed  . DEXA SCAN  Completed  . Hepatitis C Screening  Completed  . PNA vac Low Risk Adult  Completed    Health Maintenance  Health Maintenance Due  Topic Date Due  . COVID-19 Vaccine (3  - Booster for Pfizer series) 08/12/2019    Colorectal cancer screening: Type of screening: Colonoscopy. Completed 04/17/2015. Repeat every 10 years   Mammogram status: Completed 06/27/2019. Repeat every year  Bone Density status: due, will discuss with provider at physical  Lung Cancer Screening: (Low Dose CT Chest recommended if Age 71-80 years, 30 pack-year currently smoking OR have quit w/in 15 years.) does not qualify.   Additional Screening:  Hepatitis C Screening: does qualify; Completed 01/30/2016  Vision Screening: Recommended annual ophthalmology exams for early detection of glaucoma and other disorders of the eye. Is the patient up to date with their annual eye exam?  Yes  Who is the provider or what is the name of the office in which the patient attends annual eye exams? Dr. Katy Fitch, will be finding another eye doctor to go too If pt is not established with a provider, would they like to be referred to a provider to establish care? No .   Dental Screening: Recommended annual dental exams for proper oral hygiene  Community Resource Referral / Chronic Care Management: CRR required this visit?  No   CCM required this visit?  No  Plan:     I have personally reviewed and noted the following in the patient's chart:   . Medical and social history . Use of alcohol, tobacco or illicit drugs  . Current medications and supplements . Functional ability and status . Nutritional status . Physical activity . Advanced directives . List of other physicians . Hospitalizations, surgeries, and ER visits in previous 12 months . Vitals . Screenings to include cognitive, depression, and falls . Referrals and appointments  In addition, I have reviewed and discussed with patient certain preventive protocols, quality metrics, and best practice recommendations. A written personalized care plan for preventive services as well as general preventive health recommendations were provided to  patient.   Due to this being a telephonic visit, the after visit summary with patients personalized plan was offered to patient via office or my-chart. Patient preferred to pick up at office at next visit or via mychart.   Andrez Grime, LPN   03/13/5692

## 2020-02-17 NOTE — Progress Notes (Signed)
PCP notes:  Health Maintenance: Covid booster- due Dexa- due   Abnormal Screenings: none   Patient concerns: none   Nurse concerns: none   Next PCP appt: 02/24/2020 @ 8:30 am

## 2020-02-20 ENCOUNTER — Ambulatory Visit: Payer: Medicare HMO

## 2020-02-22 ENCOUNTER — Ambulatory Visit: Payer: Medicare HMO

## 2020-02-24 ENCOUNTER — Other Ambulatory Visit: Payer: Self-pay | Admitting: Family Medicine

## 2020-02-24 ENCOUNTER — Ambulatory Visit (INDEPENDENT_AMBULATORY_CARE_PROVIDER_SITE_OTHER)
Admission: RE | Admit: 2020-02-24 | Discharge: 2020-02-24 | Disposition: A | Discharge status: Home / Self Care | Payer: Medicare HMO | Source: Ambulatory Visit | Attending: Family Medicine | Admitting: Family Medicine

## 2020-02-24 ENCOUNTER — Other Ambulatory Visit: Payer: Self-pay

## 2020-02-24 ENCOUNTER — Encounter: Payer: Self-pay | Admitting: Family Medicine

## 2020-02-24 ENCOUNTER — Ambulatory Visit (INDEPENDENT_AMBULATORY_CARE_PROVIDER_SITE_OTHER): Payer: Medicare HMO | Admitting: Family Medicine

## 2020-02-24 VITALS — BP 132/90 | HR 89 | Temp 97.7°F | Ht 61.0 in | Wt 121.6 lb

## 2020-02-24 DIAGNOSIS — R8290 Unspecified abnormal findings in urine: Secondary | ICD-10-CM | POA: Diagnosis not present

## 2020-02-24 DIAGNOSIS — M85859 Other specified disorders of bone density and structure, unspecified thigh: Secondary | ICD-10-CM | POA: Diagnosis not present

## 2020-02-24 DIAGNOSIS — Z7189 Other specified counseling: Secondary | ICD-10-CM | POA: Diagnosis not present

## 2020-02-24 DIAGNOSIS — R634 Abnormal weight loss: Secondary | ICD-10-CM | POA: Diagnosis not present

## 2020-02-24 DIAGNOSIS — E039 Hypothyroidism, unspecified: Secondary | ICD-10-CM | POA: Diagnosis not present

## 2020-02-24 DIAGNOSIS — G3184 Mild cognitive impairment, so stated: Secondary | ICD-10-CM

## 2020-02-24 DIAGNOSIS — E785 Hyperlipidemia, unspecified: Secondary | ICD-10-CM | POA: Diagnosis not present

## 2020-02-24 DIAGNOSIS — N289 Disorder of kidney and ureter, unspecified: Secondary | ICD-10-CM | POA: Diagnosis not present

## 2020-02-24 DIAGNOSIS — G459 Transient cerebral ischemic attack, unspecified: Secondary | ICD-10-CM | POA: Diagnosis not present

## 2020-02-24 DIAGNOSIS — S22070A Wedge compression fracture of T9-T10 vertebra, initial encounter for closed fracture: Secondary | ICD-10-CM

## 2020-02-24 DIAGNOSIS — Z0000 Encounter for general adult medical examination without abnormal findings: Secondary | ICD-10-CM

## 2020-02-24 HISTORY — DX: Unspecified abnormal findings in urine: R82.90

## 2020-02-24 LAB — POC URINALSYSI DIPSTICK (AUTOMATED)
Blood, UA: NEGATIVE
Glucose, UA: NEGATIVE
Nitrite, UA: NEGATIVE
Protein, UA: POSITIVE — AB
Spec Grav, UA: >=1.03 — AB (ref 1.010–1.025)
Urobilinogen, UA: 2 E.U./dL — AB
pH, UA: 6 (ref 5.0–8.0)

## 2020-02-24 LAB — COMPREHENSIVE METABOLIC PANEL
ALT: 14 U/L (ref 0–35)
AST: 19 U/L (ref 0–37)
Albumin: 4.4 g/dL (ref 3.5–5.2)
Alkaline Phosphatase: 66 U/L (ref 39–117)
BUN: 10 mg/dL (ref 6–23)
CO2: 31 mEq/L (ref 19–32)
Calcium: 9.3 mg/dL (ref 8.4–10.5)
Chloride: 104 mEq/L (ref 96–112)
Creatinine, Ser: 0.84 mg/dL (ref 0.40–1.20)
GFR: 67.98 mL/min (ref >60.00)
Glucose, Bld: 105 mg/dL — ABNORMAL HIGH (ref 70–99)
Potassium: 3.7 mEq/L (ref 3.5–5.1)
Sodium: 142 mEq/L (ref 135–145)
Total Bilirubin: 0.7 mg/dL (ref 0.2–1.2)
Total Protein: 7 g/dL (ref 6.0–8.3)

## 2020-02-24 LAB — CBC WITH DIFFERENTIAL/PLATELET
Basophils Absolute: 0 10*3/uL (ref 0.0–0.1)
Basophils Relative: 0.5 % (ref 0.0–3.0)
Eosinophils Absolute: 0.1 10*3/uL (ref 0.0–0.7)
Eosinophils Relative: 1.4 % (ref 0.0–5.0)
HCT: 40.8 % (ref 36.0–46.0)
Hemoglobin: 14 g/dL (ref 12.0–15.0)
Lymphocytes Relative: 22 % (ref 12.0–46.0)
Lymphs Abs: 1 10*3/uL (ref 0.7–4.0)
MCHC: 34.3 g/dL (ref 30.0–36.0)
MCV: 94.1 fl (ref 78.0–100.0)
Monocytes Absolute: 0.4 10*3/uL (ref 0.1–1.0)
Monocytes Relative: 9.5 % (ref 3.0–12.0)
Neutro Abs: 3.1 10*3/uL (ref 1.4–7.7)
Neutrophils Relative %: 66.6 % (ref 43.0–77.0)
Platelets: 246 10*3/uL (ref 150.0–400.0)
RBC: 4.33 Mil/uL (ref 3.87–5.11)
RDW: 13.7 % (ref 11.5–15.5)
WBC: 4.7 10*3/uL (ref 4.0–10.5)

## 2020-02-24 LAB — LIPID PANEL
Cholesterol: 139 mg/dL (ref 0–200)
HDL: 50.3 mg/dL (ref >39.00)
LDL Cholesterol: 72 mg/dL (ref 0–99)
NonHDL: 88.42
Total CHOL/HDL Ratio: 3
Triglycerides: 81 mg/dL (ref 0.0–149.0)
VLDL: 16.2 mg/dL (ref 0.0–40.0)

## 2020-02-24 LAB — T4, FREE: Free T4: 0.91 ng/dL (ref 0.60–1.60)

## 2020-02-24 LAB — TSH: TSH: 2.38 u[IU]/mL (ref 0.35–4.50)

## 2020-02-24 MED ORDER — LEVOTHYROXINE SODIUM 25 MCG PO TABS: 25.0000 ug | ORAL_TABLET | Freq: Every day | ORAL | 11 refills | Status: DC

## 2020-02-24 MED ORDER — LEVOTHYROXINE SODIUM 25 MCG PO TABS: 25.0000 ug | ORAL_TABLET | Freq: Every day | ORAL | 3 refills | Status: DC

## 2020-02-24 MED ORDER — ATORVASTATIN CALCIUM 40 MG PO TABS: 40.0000 mg | ORAL_TABLET | Freq: Every day | ORAL | 3 refills | Status: DC

## 2020-02-24 MED ORDER — CLOPIDOGREL BISULFATE 75 MG PO TABS: 75.0000 mg | ORAL_TABLET | Freq: Every day | ORAL | 3 refills | Status: DC

## 2020-02-24 NOTE — Assessment & Plan Note (Addendum)
This improved on recheck. Update Cr.

## 2020-02-24 NOTE — Assessment & Plan Note (Signed)
Anther 8 lbs since last visit, total 22 lbs of unintentional weight loss in the past year. S/p partial hysterectomy, colon cancer and breast cancer screening up to date. No localizing symptoms otherwise. Check labs, UA, CXR and CT abd/pelvis for further evaluation. If unrevealing, reviewed importance of good nutrition status. RTC 2 mo close f/u planned.

## 2020-02-24 NOTE — Assessment & Plan Note (Signed)
UA with 2+ LE, protein, 3+ bili and concentrated.  However symptoms not consistent with UTI - ?asxs bacteriuria.  Consider repeat UA once improved hydration status.

## 2020-02-24 NOTE — Patient Instructions (Addendum)
Labs today  Chest xray today.  Urinalysis today.  I will order CT scan to further evaluate weight loss.  Start taking protein supplement daily (ensure or boost).  Schedule eye exam to check cataracts.  Bring [place] a copy of your advanced directives.  Return in 2 months for follow up visit.   Health Maintenance After Age 76 After age 43, you are at a higher risk for certain long-term diseases and infections as well as injuries from falls. Falls are a major cause of broken bones and head injuries in people who are older than age 78. Getting regular preventive care can help to keep you healthy and well. Preventive care includes getting regular testing and making lifestyle changes as recommended by your health care provider. Talk with your health care provider about:  Which screenings and tests you should have. A screening is a test that checks for a disease when you have no symptoms.  A diet and exercise plan that is right for you. What should I know about screenings and tests to prevent falls? Screening and testing are the best ways to find a health problem early. Early diagnosis and treatment give you the best chance of managing medical conditions that are common after age 11. Certain conditions and lifestyle choices may make you more likely to have a fall. Your health care provider may recommend:  Regular vision checks. Poor vision and conditions such as cataracts can make you more likely to have a fall. If you wear glasses, make sure to get your prescription updated if your vision changes.  Medicine review. Work with your health care provider to regularly review all of the medicines you are taking, including over-the-counter medicines. Ask your health care provider about any side effects that may make you more likely to have a fall. Tell your health care provider if any medicines that you take make you feel dizzy or sleepy.  Osteoporosis screening. Osteoporosis is a condition that causes the  bones to get weaker. This can make the bones weak and cause them to break more easily.  Blood pressure screening. Blood pressure changes and medicines to control blood pressure can make you feel dizzy.  Strength and balance checks. Your health care provider may recommend certain tests to check your strength and balance while standing, walking, or changing positions.  Foot health exam. Foot pain and numbness, as well as not wearing proper footwear, can make you more likely to have a fall.  Depression screening. You may be more likely to have a fall if you have a fear of falling, feel emotionally low, or feel unable to do activities that you used to do.  Alcohol use screening. Using too much alcohol can affect your balance and may make you more likely to have a fall. What actions can I take to lower my risk of falls? General instructions  Talk with your health care provider about your risks for falling. Tell your health care provider if: ? You fall. Be sure to tell your health care provider about all falls, even ones that seem minor. ? You feel dizzy, sleepy, or off-balance.  Take over-the-counter and prescription medicines only as told by your health care provider. These include any supplements.  Eat a healthy diet and maintain a healthy weight. A healthy diet includes low-fat dairy products, low-fat (lean) meats, and fiber from whole grains, beans, and lots of fruits and vegetables. Home safety  Remove any tripping hazards, such as rugs, cords, and clutter.  Install safety  equipment such as grab bars in bathrooms and safety rails on stairs.  Keep rooms and walkways well-lit. Activity  Follow a regular exercise program to stay fit. This will help you maintain your balance. Ask your health care provider what types of exercise are appropriate for you.  If you need a cane or walker, use it as recommended by your health care provider.  Wear supportive shoes that have nonskid soles.    Lifestyle  Do not drink alcohol if your health care provider tells you not to drink.  If you drink alcohol, limit how much you have: ? 0-1 drink a day for women. ? 0-2 drinks a day for men.  Be aware of how much alcohol is in your drink. In the [place] one drink equals one typical bottle of beer (12 oz), one-half glass of wine (5 oz), or one shot of hard liquor (1 oz).  Do not use any products that contain nicotine or tobacco, such as cigarettes and e-cigarettes. If you need help quitting, ask your health care provider. Summary  Having a healthy lifestyle and getting preventive care can help to protect your health and wellness after age 10.  Screening and testing are the best way to find a health problem early and help you avoid having a fall. Early diagnosis and treatment give you the best chance for managing medical conditions that are more common for people who are older than age 62.  Falls are a major cause of broken bones and head injuries in people who are older than age 107. Take precautions to prevent a fall at home.  Work with your health care provider to learn what changes you can make to improve your health and wellness and to prevent falls. This information is not intended to replace advice given to you by your health care provider. Make sure you discuss any questions you have with your health care provider. Document Revised: 04/15/2018 Document Reviewed: 11/05/2016 Elsevier Patient Education  2021 [employer].

## 2020-02-24 NOTE — Assessment & Plan Note (Addendum)
Update TFTs on levothyroxine 36mcg daily.

## 2020-02-24 NOTE — Assessment & Plan Note (Signed)
Never completed dexa ordered last year.

## 2020-02-24 NOTE — Assessment & Plan Note (Signed)
H/o this, now on plavix and statin.

## 2020-02-24 NOTE — Assessment & Plan Note (Addendum)
Chronic, update FLP on atorvastatin. LDL goal <70 given presumed CVA The 10-year ASCVD risk score Mikey Bussing DC Jr., et al., 2013) is: 16.8%   Values used to calculate the score:     Age: 75 years     Sex: Female     Is Non-Hispanic African American: No     Diabetic: No     Tobacco smoker: No     Systolic Blood Pressure: 798 mmHg     Is BP treated: No     HDL Cholesterol: 51.7 mg/dL     Total Cholesterol: 181 mg/dL

## 2020-02-24 NOTE — Assessment & Plan Note (Addendum)
She lives alone, son very supportive.  Continue to monitor, consider rpt MMSE next visit.

## 2020-02-24 NOTE — Assessment & Plan Note (Signed)
Preventative protocols reviewed and updated unless pt declined. Discussed healthy diet and lifestyle.  

## 2020-02-24 NOTE — Assessment & Plan Note (Signed)
Advanced directives: discussed - thinks HCPOA would be son. Thinks she filled this out. Asked to bring in to update chart. ?

## 2020-02-24 NOTE — Progress Notes (Signed)
Patient ID: Marilyn Cook, female    DOB: 05-30-1945, 76 y.o.   MRN: [id number]  This visit was conducted in person.  BP 132/90   Pulse 89   Temp 97.7 F (36.5 C) (Temporal)   Ht 5\' 1"  (1.549 m)   Wt 121 lb 9 oz (55.1 kg)   SpO2 98%   BMI 22.97 kg/m    CC: CPE Subjective:   HPI: Marilyn Cook is a 75 y.o. female presenting on 02/24/2020 for Annual Exam   Saw health advisor last week for medicare wellness visit. Note reviewed.   No exam data present  Flowsheet Row Clinical Support from 02/17/2020 in [place] at [facility] Total Score 0      Fall Risk  02/17/2020 02/14/2019 02/05/2018 02/02/2015 08/04/2012  Falls in the past year? 1 1 0 No No  Comment - tripped and fell - - -  Number falls in past yr: 0 1 - - -  Injury with Fall? 0 0 - - -  Risk for fall due to : No Fall Risks Medication side effect - - -  Follow up Falls evaluation completed;Falls prevention discussed Falls evaluation completed;Falls prevention discussed - - -  Fell 2 wks ago, fell out of bed while trying to get to the bathroom. Bruised left buttock.   She lives alone. Mild cognitive impairment on last visit - MMSE 24/30 09/2019. ?early Alz vs LBD vs situational memory trouble (husband's death, stress over preparing class reunion).   Hypothyroidism - started on levothyroxine 38mcg daily.  Ongoing weight loss noted - another 8 lbs in the past 3 months, total of 23 lbs over the past year. Endorses poor appetite. Denies chest pain tightness or dyspnea, fevers/chills, night sweats, fatigue or enlarged glands. No abd pain, nausea, vomiting, bowel changes or blood in stool. No bloating gassiness or indigestion.  Notes left eye drains clear fluid regularly.   Lots of funerals in the family this past year.  Husband passed away 22-Mar-2019. 3 cousins, aunt, husband's uncle.   Preventative: COLONOSCOPY 04/2015 diverticulosis, int hem, rpt 10 yrs (Nandigam) Mammogram 06/2019 - Birads1 @ [facility]. Pap smear  - Partial hysterectomy for benign reason (fibroids), ovaries remain. Bimanual only done 2015.Denies pelvic pain or pressure.  DEXA Date: 08/2012 spine WNL, hip T -1.2. Takes calcium+ vit D daily. Flublocyearly (egg allergy)  Covid vaccine Pfizer 01/2019, 03/22/19, due for booster, discussed  Pneumovax 07/31/2011, prevnar1/2017  Tdap 07/2011  zostavax -11/2015  shingrix - 07/2017, 11/2017 Advanced directives: discussed -thinksHCPOAwould be son.Thinks she filled this out. Asked to bring in to update chart. Seat belt use discussed Sunscreen use discussed - allergic to sunscreen. No changing moles on skin. Sees dermatologist yearly.  Non smoker Alcohol - none Dentist q6 mo  Eye exam yearly - [facility] - sees halos at night. Bowel - no constipation  Bladder - no incontinence   Widow - husband Patrick Jupiter died from cardiac arrest 03/22/19 One son [name]) [place] [employer] at [employer]- retired 03/2017 Activity: no regular exercise, active through rehab Diet: some water, fruits/vegetables daily     Relevant past medical, surgical, family and social history reviewed and updated as indicated. Interim medical history since our last visit reviewed. Allergies and medications reviewed and updated. Outpatient Medications Prior to Visit  Medication Sig Dispense Refill  . Calcium Carb-Cholecalciferol (CALCIUM-VITAMIN D) 600-400 MG-UNIT TABS Take 1 tablet by mouth 2 (two) times daily.     . cetirizine (ZYRTEC) 10 MG tablet  Take 10 mg by mouth daily.    [name] co-enzyme Q-10 30 MG capsule Take 30 mg by mouth daily.    . hydrocortisone 2.5 % cream Apply 1 application topically 2 (two) times daily as needed (itching).    . Misc Natural Products (BLACK CHERRY CONCENTRATE PO) Take 1 tablet by mouth daily.     . Potassium Chloride (K-TABS PO) Take 1 tablet by mouth daily.    . Pyridoxine HCl (VITAMIN B-6 PO) Take 1 tablet by mouth daily.    [name] triamcinolone (KENALOG) 0.147 MG/GM topical spray Apply  topically 2 (two) times daily as needed. For psoriasis 63 g 1  . vitamin B-12 (CYANOCOBALAMIN) 500 MCG tablet Take 1 tablet (500 mcg total) by mouth every Monday, Wednesday, and Friday.    [name] atorvastatin (LIPITOR) 40 MG tablet TAKE 1 TABLET BY MOUTH EVERY DAY 90 tablet 0  . clopidogrel (PLAVIX) 75 MG tablet TAKE 1 TABLET BY MOUTH EVERY DAY 90 tablet 3  . levothyroxine (SYNTHROID) 25 MCG tablet Take 1 tablet (25 mcg total) by mouth daily before breakfast. 30 tablet 6  . Ascorbic Acid (VITAMIN C PO) Take 1 capsule by mouth daily.    . Cinnamon 500 MG capsule Take 1,000 mg by mouth 2 (two) times daily.    [name] CRANBERRY PO Take 1 tablet by mouth daily.     No facility-administered medications prior to visit.     Per HPI unless specifically indicated in ROS section below Review of Systems  Constitutional: Negative for activity change, appetite change, chills, fatigue, fever and unexpected weight change.  HENT: Negative for hearing loss.   Eyes: Negative for visual disturbance.  Respiratory: Negative for cough, chest tightness, shortness of breath and wheezing.   Cardiovascular: Negative for chest pain, palpitations and leg swelling.  Gastrointestinal: Negative for abdominal distention, abdominal pain, blood in stool, constipation, diarrhea, nausea and vomiting.  Genitourinary: Negative for difficulty urinating and hematuria.  Musculoskeletal: Negative for arthralgias, myalgias and neck pain.  Skin: Negative for rash.  Neurological: Negative for dizziness, seizures, syncope and headaches.  Hematological: Negative for adenopathy. Does not bruise/bleed easily.  Psychiatric/Behavioral: Negative for dysphoric mood. The patient is not nervous/anxious.    Objective:  BP 132/90   Pulse 89   Temp 97.7 F (36.5 C) (Temporal)   Ht 5\' 1"  (1.549 m)   Wt 121 lb 9 oz (55.1 kg)   SpO2 98%   BMI 22.97 kg/m   Wt Readings from Last 3 Encounters:  02/24/20 121 lb 9 oz (55.1 kg)  10/18/19 129 lb 5 oz  (58.7 kg)  09/13/19 135 lb (61.2 kg)      Physical Exam Vitals and nursing note reviewed.  Constitutional:      General: She is not in acute distress.    Appearance: She is well-developed and well-nourished. She is not ill-appearing.  HENT:     Head: Normocephalic and atraumatic.     Right Ear: Hearing, tympanic membrane, ear canal and external ear normal.     Left Ear: Hearing, tympanic membrane, ear canal and external ear normal.     Mouth/Throat:     Mouth: Oropharynx is clear and moist and mucous membranes are normal.     Pharynx: No posterior oropharyngeal edema.  Eyes:     General: No scleral icterus.    Extraocular Movements: Extraocular movements intact and EOM normal.     Conjunctiva/sclera: Conjunctivae normal.     Pupils: Pupils are equal, round, and reactive to light.  Neck:     Thyroid: No thyroid mass or thyromegaly.     Vascular: No carotid bruit.  Cardiovascular:     Rate and Rhythm: Normal rate and regular rhythm.     Pulses: Normal pulses and intact distal pulses.          Radial pulses are 2+ on the right side and 2+ on the left side.     Heart sounds: Normal heart sounds. No murmur heard.   Pulmonary:     Effort: Pulmonary effort is normal. No respiratory distress.     Breath sounds: Normal breath sounds. No wheezing or rales.  Abdominal:     General: Abdomen is flat. Bowel sounds are normal. There is no distension.     Palpations: Abdomen is soft. There is no mass.     Tenderness: There is no abdominal tenderness. There is no guarding or rebound.     Hernia: No hernia is present.  Musculoskeletal:        General: No edema. Normal range of motion.     Cervical back: Normal range of motion and neck supple.     Right lower leg: No edema.     Left lower leg: No edema.  Lymphadenopathy:     Cervical: No cervical adenopathy.  Skin:    General: Skin is warm and dry.     Findings: No rash.  Neurological:     General: No focal deficit present.     Mental  Status: She is alert and oriented to person, place, and time.     Comments: CN grossly intact, station and gait intact  Psychiatric:        Mood and Affect: Mood and affect and mood normal.        Behavior: Behavior normal.        Thought Content: Thought content normal.        Judgment: Judgment normal.       Results for orders placed or performed in visit on 02/24/20  Lipid panel  Result Value Ref Range   Cholesterol 139 0 - 200 mg/dL   Triglycerides 81.0 0.0 - 149.0 mg/dL   HDL 50.30 >39.00 mg/dL   VLDL 16.2 0.0 - 40.0 mg/dL   LDL Cholesterol 72 0 - 99 mg/dL   Total CHOL/HDL Ratio 3    NonHDL 88.42   Comprehensive metabolic panel  Result Value Ref Range   Sodium 142 135 - 145 mEq/L   Potassium 3.7 3.5 - 5.1 mEq/L   Chloride 104 96 - 112 mEq/L   CO2 31 19 - 32 mEq/L   Glucose, Bld 105 (H) 70 - 99 mg/dL   BUN 10 6 - 23 mg/dL   Creatinine, Ser 0.84 0.40 - 1.20 mg/dL   Total Bilirubin 0.7 0.2 - 1.2 mg/dL   Alkaline Phosphatase 66 39 - 117 U/L   AST 19 0 - 37 U/L   ALT 14 0 - 35 U/L   Total Protein 7.0 6.0 - 8.3 g/dL   Albumin 4.4 3.5 - 5.2 g/dL   GFR 67.98 >60.00 mL/min   Calcium 9.3 8.4 - 10.5 mg/dL  TSH  Result Value Ref Range   TSH 2.38 0.35 - 4.50 uIU/mL  CBC with Differential/Platelet  Result Value Ref Range   WBC 4.7 4.0 - 10.5 K/uL   RBC 4.33 3.87 - 5.11 Mil/uL   Hemoglobin 14.0 12.0 - 15.0 g/dL   HCT 40.8 36.0 - 46.0 %   MCV 94.1 78.0 - 100.0 fl  MCHC 34.3 30.0 - 36.0 g/dL   RDW 13.7 11.5 - 15.5 %   Platelets 246.0 150.0 - 400.0 K/uL   Neutrophils Relative % 66.6 43.0 - 77.0 %   Lymphocytes Relative 22.0 12.0 - 46.0 %   Monocytes Relative 9.5 3.0 - 12.0 %   Eosinophils Relative 1.4 0.0 - 5.0 %   Basophils Relative 0.5 0.0 - 3.0 %   Neutro Abs 3.1 1.4 - 7.7 K/uL   Lymphs Abs 1.0 0.7 - 4.0 K/uL   Monocytes Absolute 0.4 0.1 - 1.0 K/uL   Eosinophils Absolute 0.1 0.0 - 0.7 K/uL   Basophils Absolute 0.0 0.0 - 0.1 K/uL  T4, free  Result Value Ref Range    Free T4 0.91 0.60 - 1.60 ng/dL  POCT Urinalysis Dipstick (Automated)  Result Value Ref Range   Color, UA dark yellow    Clarity, UA clear    Glucose, UA Negative Negative   Bilirubin, UA 3+    Ketones, UA +/-    Spec Grav, UA >=1.030 (A) 1.010 - 1.025   Blood, UA negative    pH, UA 6.0 5.0 - 8.0   Protein, UA Positive (A) Negative   Urobilinogen, UA 2.0 (A) 0.2 or 1.0 E.U./dL   Nitrite, UA negative    Leukocytes, UA Moderate (2+) (A) Negative   Assessment & Plan:  This visit occurred during the SARS-CoV-2 public health emergency.  Safety protocols were in place, including screening questions prior to the visit, additional usage of staff PPE, and extensive cleaning of exam room while observing appropriate contact time as indicated for disinfecting solutions.   Problem List Items Addressed This Visit    Unintentional weight loss    Anther 8 lbs since last visit, total 22 lbs of unintentional weight loss in the past year. S/p partial hysterectomy, colon cancer and breast cancer screening up to date. No localizing symptoms otherwise. Check labs, UA, CXR and CT abd/pelvis for further evaluation. If unrevealing, reviewed importance of good nutrition status. RTC 2 mo close f/u planned.       Relevant Orders   Comprehensive metabolic panel (Completed)   TSH (Completed)   CBC with Differential/Platelet (Completed)   T4, free (Completed)   POCT Urinalysis Dipstick (Automated) (Completed)   DG Chest 2 View   CT Abdomen Pelvis W Contrast   TIA (transient ischemic attack)    H/o this, now on plavix and statin.       Relevant Medications   atorvastatin (LIPITOR) 40 MG tablet   Renal insufficiency    This improved on recheck. Update Cr.       Osteopenia of hip    Never completed dexa ordered last year.       MCI (mild cognitive impairment) with memory loss    She lives alone, son very supportive.  Continue to monitor, consider rpt MMSE next visit.       HLD (hyperlipidemia)     Chronic, update FLP on atorvastatin. LDL goal <70 given presumed CVA The 10-year ASCVD risk score Mikey Bussing DC Brooke Bonito., et al., 2013) is: 16.8%   Values used to calculate the score:     Age: 83 years     Sex: Female     Is Non-Hispanic African American: No     Diabetic: No     Tobacco smoker: No     Systolic Blood Pressure: 643 mmHg     Is BP treated: No     HDL Cholesterol: 51.7 mg/dL  Total Cholesterol: 181 mg/dL       Relevant Medications   atorvastatin (LIPITOR) 40 MG tablet   Other Relevant Orders   Lipid panel (Completed)   Comprehensive metabolic panel (Completed)   Healthcare maintenance - Primary    Preventative protocols reviewed and updated unless pt declined. Discussed healthy diet and lifestyle.       Borderline hypothyroidism    Update TFTs on levothyroxine 52mcg daily.       Relevant Medications   levothyroxine (SYNTHROID) 25 MCG tablet   Advanced care planning/counseling discussion    Advanced directives: discussed -thinksHCPOAwould be son.Thinks she filled this out. Asked to bring in to update chart.      Abnormal urinalysis    UA with 2+ LE, protein, 3+ bili and concentrated.  However symptoms not consistent with UTI - ?asxs bacteriuria.  Consider repeat UA once improved hydration status.           Meds ordered this encounter  Medications  . atorvastatin (LIPITOR) 40 MG tablet    Sig: Take 1 tablet (40 mg total) by mouth daily.    Dispense:  90 tablet    Refill:  3  . clopidogrel (PLAVIX) 75 MG tablet    Sig: Take 1 tablet (75 mg total) by mouth daily.    Dispense:  90 tablet    Refill:  3  . levothyroxine (SYNTHROID) 25 MCG tablet    Sig: Take 1 tablet (25 mcg total) by mouth daily before breakfast.    Dispense:  30 tablet    Refill:  11   Orders Placed This Encounter  Procedures  . DG Chest 2 View    Standing Status:   Future    Number of Occurrences:   1    Standing Expiration Date:   02/23/2021    Order Specific Question:   Reason  for Exam (SYMPTOM  OR DIAGNOSIS REQUIRED)    Answer:   unintentional weight loss    Order Specific Question:   Preferred imaging location?    Answer:   [facility]  . CT Abdomen Pelvis W Contrast    Standing Status:   Future    Standing Expiration Date:   02/23/2021    Order Specific Question:   If indicated for the ordered procedure, I authorize the administration of contrast media per Radiology protocol    Answer:   Yes    Order Specific Question:   Preferred imaging location?    Answer:   [facility]    Order Specific Question:   Is Oral Contrast requested for this exam?    Answer:   Yes, Per Radiology protocol  . Lipid panel  . Comprehensive metabolic panel  . TSH  . CBC with Differential/Platelet  . T4, free  . POCT Urinalysis Dipstick (Automated)    Patient instructions: Labs today  Chest xray today.  Urinalysis today.  I will order CT scan to further evaluate weight loss.  Start taking protein supplement daily (ensure or boost).  Schedule eye exam to check cataracts.  Bring [place] a copy of your advanced directives.  Return in 2 months for follow up visit.   Follow up plan: Return in about 2 months (around 04/23/2020) for follow up visit.  Ria Bush, MD

## 2020-02-25 DIAGNOSIS — S22000A Wedge compression fracture of unspecified thoracic vertebra, initial encounter for closed fracture: Secondary | ICD-10-CM | POA: Insufficient documentation

## 2020-02-25 HISTORY — DX: Wedge compression fracture of unspecified thoracic vertebra, initial encounter for closed fracture: S22.000A

## 2020-02-25 NOTE — Addendum Note (Signed)
Addended by: Ria Bush on: 02/25/2020 10:09 AM   Modules accepted: Orders

## 2020-03-05 ENCOUNTER — Telehealth: Payer: Self-pay

## 2020-03-05 NOTE — Telephone Encounter (Signed)
Have called both number in the chart but not able to leave a message on those, called patient's son Dennis Killilea, left message asking patient to call us back and that we have been trying to reach patient for several days now.  Need to relay information in regards to CT appointment. Scheduled 03/13/20 at 11:00 (arr 10:45) at [facility], NPO 4hr prior, Pick up oral CM a few days prior Can take morning meds with sips of water if within 4 hr window

## 2020-03-05 NOTE — Telephone Encounter (Signed)
Patient came in office and relayed message in regards to CT can. Pt expressed understanding.

## 2020-03-08 ENCOUNTER — Encounter: Payer: Self-pay | Admitting: Family Medicine

## 2020-03-08 DIAGNOSIS — Z780 Asymptomatic menopausal state: Secondary | ICD-10-CM | POA: Diagnosis not present

## 2020-03-08 DIAGNOSIS — M85851 Other specified disorders of bone density and structure, right thigh: Secondary | ICD-10-CM | POA: Diagnosis not present

## 2020-03-08 DIAGNOSIS — M85852 Other specified disorders of bone density and structure, left thigh: Secondary | ICD-10-CM | POA: Diagnosis not present

## 2020-03-13 ENCOUNTER — Other Ambulatory Visit: Payer: Self-pay

## 2020-03-13 ENCOUNTER — Ambulatory Visit
Admission: RE | Admit: 2020-03-13 | Discharge: 2020-03-13 | Disposition: A | Discharge status: Home / Self Care | Payer: Medicare HMO | Source: Ambulatory Visit | Attending: Family Medicine | Admitting: Family Medicine

## 2020-03-13 DIAGNOSIS — I700 Atherosclerosis of aorta: Secondary | ICD-10-CM | POA: Diagnosis not present

## 2020-03-13 DIAGNOSIS — K5730 Diverticulosis of large intestine without perforation or abscess without bleeding: Secondary | ICD-10-CM | POA: Diagnosis not present

## 2020-03-13 DIAGNOSIS — K449 Diaphragmatic hernia without obstruction or gangrene: Secondary | ICD-10-CM | POA: Diagnosis not present

## 2020-03-13 DIAGNOSIS — R634 Abnormal weight loss: Secondary | ICD-10-CM | POA: Insufficient documentation

## 2020-03-13 MED ORDER — IOHEXOL 300 MG/ML  SOLN
85.0000 mL | Freq: Once | INTRAMUSCULAR | Status: AC | PRN
  Administered 2020-03-13: 85 mL via INTRAVENOUS

## 2020-03-15 ENCOUNTER — Telehealth: Payer: Self-pay | Admitting: Family Medicine

## 2020-03-15 ENCOUNTER — Encounter: Payer: Self-pay | Admitting: Family Medicine

## 2020-03-15 NOTE — Telephone Encounter (Signed)
plz notify DEXA scan returned showing worsened readings. Her history of compression fracture seen on recent CXR classifies her as osteoporosis and we should consider medication to help prevent future fracture. May schedule office visit to review possible medication options if interested.

## 2020-03-16 NOTE — Telephone Encounter (Signed)
Spoke with pt relaying results and Dr. Synthia Innocent message.  Pt verbalizes understanding and scheduled OV on 03/23/20 at 8:00.

## 2020-03-18 ENCOUNTER — Encounter: Payer: Self-pay | Admitting: Family Medicine

## 2020-03-18 ENCOUNTER — Other Ambulatory Visit: Payer: Self-pay | Admitting: Family Medicine

## 2020-03-18 DIAGNOSIS — N2889 Other specified disorders of kidney and ureter: Secondary | ICD-10-CM

## 2020-03-18 DIAGNOSIS — K449 Diaphragmatic hernia without obstruction or gangrene: Secondary | ICD-10-CM

## 2020-03-18 DIAGNOSIS — I700 Atherosclerosis of aorta: Secondary | ICD-10-CM | POA: Insufficient documentation

## 2020-03-18 DIAGNOSIS — N289 Disorder of kidney and ureter, unspecified: Secondary | ICD-10-CM | POA: Insufficient documentation

## 2020-03-18 HISTORY — DX: Diaphragmatic hernia without obstruction or gangrene: K44.9

## 2020-03-18 HISTORY — DX: Atherosclerosis of aorta: I70.0

## 2020-03-18 HISTORY — DX: Disorder of kidney and ureter, unspecified: N28.9

## 2020-03-20 ENCOUNTER — Telehealth: Payer: Self-pay

## 2020-03-20 NOTE — Telephone Encounter (Signed)
Spoke with pt.  (see Imaging Result Notes, 03/13/20)

## 2020-03-20 NOTE — Telephone Encounter (Signed)
Marilyn [name] - Client Nonclinical Telephone Record AccessNurse Client Marilyn [name] - Client Client Site [place] Physician Marilyn [name] - MD Contact Type Call Who Is Calling Patient / Member / Family / Caregiver Caller Name [place] Phone Number [phone] Call Type Message Only Information Provided Reason for Call Returning a Call from the Office Initial Comment Caller missed a call from the office. Additional Comment Provided caller with office hours and advised to call back during business hours. Disp. Time Disposition Final User 03/19/2020 5:56:44 PM General Information Provided Yes [name], Marilyn Call Closed By: Marilyn [name] Transaction Date/Time: 03/19/2020 5:54:53 PM (ET)

## 2020-03-23 ENCOUNTER — Encounter: Payer: Self-pay | Admitting: Family Medicine

## 2020-03-23 ENCOUNTER — Telehealth: Payer: Self-pay | Admitting: Family Medicine

## 2020-03-23 ENCOUNTER — Other Ambulatory Visit: Payer: Self-pay

## 2020-03-23 ENCOUNTER — Telehealth: Payer: Self-pay

## 2020-03-23 ENCOUNTER — Ambulatory Visit (INDEPENDENT_AMBULATORY_CARE_PROVIDER_SITE_OTHER): Payer: Medicare HMO | Admitting: Family Medicine

## 2020-03-23 VITALS — BP 136/72 | HR 87 | Temp 97.6°F | Ht 61.0 in | Wt 124.2 lb

## 2020-03-23 DIAGNOSIS — K449 Diaphragmatic hernia without obstruction or gangrene: Secondary | ICD-10-CM

## 2020-03-23 DIAGNOSIS — R634 Abnormal weight loss: Secondary | ICD-10-CM

## 2020-03-23 DIAGNOSIS — I700 Atherosclerosis of aorta: Secondary | ICD-10-CM | POA: Diagnosis not present

## 2020-03-23 DIAGNOSIS — M810 Age-related osteoporosis without current pathological fracture: Secondary | ICD-10-CM | POA: Diagnosis not present

## 2020-03-23 DIAGNOSIS — S22070D Wedge compression fracture of T9-T10 vertebra, subsequent encounter for fracture with routine healing: Secondary | ICD-10-CM | POA: Diagnosis not present

## 2020-03-23 DIAGNOSIS — N2889 Other specified disorders of kidney and ureter: Secondary | ICD-10-CM | POA: Diagnosis not present

## 2020-03-23 NOTE — Assessment & Plan Note (Signed)
Incidentally noted on CXR.

## 2020-03-23 NOTE — Telephone Encounter (Signed)
Patient is scheduled for MRI on 04/06/20 at 3 pm, arrive at 2:30 pm, at [facility]. Go to [place] entrance. Nothing to eat or drink 4 hours prior. Patient will call me back later today to get this information.

## 2020-03-23 NOTE — Telephone Encounter (Signed)
Patient advised of this information and verbalized understanding.

## 2020-03-23 NOTE — Assessment & Plan Note (Signed)
Some weight gain noted. Reviewed ongoing diet efforts.

## 2020-03-23 NOTE — Telephone Encounter (Signed)
Benefits will be submitted to find out cost

## 2020-03-23 NOTE — Assessment & Plan Note (Signed)
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 She denies symptoms of GERD or dysphagia.  Will monitor for now.

## 2020-03-23 NOTE — Telephone Encounter (Signed)
Benefit verification/cost submitted-awaiting reply

## 2020-03-23 NOTE — Patient Instructions (Addendum)
Call to schedule eye exam.  We will order MRI to check spot on right kidney.  For new osteoporosis - restart calcium and vitamin D supplement regularly twice daily. Work on regular walking routine.  We will price out prolia for you.  Return as needed or in 3-4 months for follow up visit.   Osteoporosis  Osteoporosis happens when the bones become thin and less dense than normal. Osteoporosis makes bones more brittle and fragile and more likely to break (fracture). Over time, osteoporosis can cause your bones to become so weak that they fracture after a minor fall. Bones in the hip, wrist, and spine are most likely to fracture due to osteoporosis. What are the causes? The exact cause of this condition is not known. What increases the risk? You are more likely to develop this condition if you:  Have family members with this condition.  Have poor nutrition.  Use the following: ? Steroid medicines, such as prednisone. ? Anti-seizure medicines. ? Nicotine or tobacco, such as cigarettes, e-cigarettes, and chewing tobacco.  Are female.  Are age 70 or older.  Are not physically active (are sedentary).  Are of European or Asian descent.  Have a small body frame. What are the signs or symptoms? A fracture might be the first sign of osteoporosis, especially if the fracture results from a fall or injury that usually would not cause a bone to break. Other signs and symptoms include:  Pain in the neck or low back.  Stooped posture.  Loss of height. How is this diagnosed? This condition may be diagnosed based on:  Your medical history.  A physical exam.  A bone mineral density test, also called a DXA or DEXA test (dual-energy X-ray absorptiometry test). This test uses X-rays to measure the amount of minerals in your bones. How is this treated? This condition may be treated by:  Making lifestyle changes, such as: ? Including foods with more calcium and vitamin D in your  diet. ? Doing weight-bearing and muscle-strengthening exercises. ? Stopping tobacco use. ? Limiting alcohol intake.  Taking medicine to slow the process of bone loss or to increase bone density.  Taking daily supplements of calcium and vitamin D.  Taking hormone replacement medicines, such as estrogen for women and testosterone for men.  Monitoring your levels of calcium and vitamin D. The goal of treatment is to strengthen your bones and lower your risk for a fracture. Follow these instructions at home: Eating and drinking Include calcium and vitamin D in your diet. Calcium is important for bone health, and vitamin D helps your body absorb calcium. Good sources of calcium and vitamin D include:  Certain fatty fish, such as salmon and tuna.  Products that have calcium and vitamin D added to them (are fortified), such as fortified cereals.  Egg yolks.  Cheese.  Liver.   Activity Do exercises as told by your health care provider. Ask your health care provider what exercises and activities are safe for you. You should do:  Exercises that make you work against gravity (weight-bearing exercises), such as tai chi, yoga, or walking.  Exercises to strengthen muscles, such as lifting weights. Lifestyle  Do not drink alcohol if: ? Your health care provider tells you not to drink. ? You are pregnant, may be pregnant, or are planning to become pregnant.  If you drink alcohol: ? Limit how much you use to:  0-1 drink a day for women.  0-2 drinks a day for men.  Know  how much alcohol is in your drink. In the [place] one drink equals one 12 oz bottle of beer (355 mL), one 5 oz glass of wine (148 mL), or one 1 oz glass of hard liquor (44 mL).  Do not use any products that contain nicotine or tobacco, such as cigarettes, e-cigarettes, and chewing tobacco. If you need help quitting, ask your health care provider. Preventing falls  Use devices to help you move around (mobility aids) as  needed, such as canes, walkers, scooters, or crutches.  Keep rooms well-lit and clutter-free.  Remove tripping hazards from walkways, including cords and throw rugs.  Install grab bars in bathrooms and safety rails on stairs.  Use rubber mats in the bathroom and other areas that are often wet or slippery.  Wear closed-toe shoes that fit well and support your feet. Wear shoes that have rubber soles or low heels.  Review your medicines with your health care provider. Some medicines can cause dizziness or changes in blood pressure, which can increase your risk of falling. General instructions  Take over-the-counter and prescription medicines only as told by your health care provider.  Keep all follow-up visits. This is important. Contact a health care provider if:  You have never been screened for osteoporosis and you are: ? A woman who is age 63 or older. ? A man who is age 18 or older. Get help right away if:  You fall or injure yourself. Summary  Osteoporosis is thinning and loss of density in your bones. This makes bones more brittle and fragile and more likely to break (fracture),even with minor falls.  The goal of treatment is to strengthen your bones and lower your risk for a fracture.  Include calcium and vitamin D in your diet. Calcium is important for bone health, and vitamin D helps your body absorb calcium.  Talk with your health care provider about screening for osteoporosis if you are a woman who is age 72 or older, or a man who is age 67 or older. This information is not intended to replace advice given to you by your health care provider. Make sure you discuss any questions you have with your health care provider. Document Revised: 06/09/2019 Document Reviewed: 06/09/2019 Elsevier Patient Education  [place].

## 2020-03-23 NOTE — Telephone Encounter (Signed)
Can we price out prolia for patient?  thank you.

## 2020-03-23 NOTE — Assessment & Plan Note (Addendum)
Discussed OP diagnosis based on recently noted compression fracture at T10. Reviewed treatment options including estrogenic meds and bisphosphonates vs prolia. Avoid bisphosphonate given newly noted small-mod HH with possible esophageal dysmotility by CT. Reviewed calcium/vit D supplementation as well as regular weight bearing exercise.  She is interested in prolia - will ask my office to price out for her.

## 2020-03-23 NOTE — Progress Notes (Signed)
Patient ID: Marilyn Cook, female    DOB: 09/15/1945, 75 y.o.   MRN: [id number]  This visit was conducted in person.  BP 136/72   Pulse 87   Temp 97.6 F (36.4 C) (Temporal)   Ht 5\' 1"  (1.549 m)   Wt 124 lb 4 oz (56.4 kg)   SpO2 96%   BMI 23.48 kg/m    CC: discuss OP treatment  Subjective:   HPI: Marilyn Cook is a 75 y.o. female presenting on 03/23/2020 for Osteoporosis (Here to discuss tx options.  Pt accompanied by son, Marilyn Cook- temp 97.9.)   Recent DEXA scan in osteopenia range however h/o compression fracture bumps her up into osteoporosis diagnosis.  [facility] 03/2020 - spine -1.0, R hip neck -1.9 - however dx osteoporosis given h/o vertebral compression fracture on previous CXR, rec rpt 1 yr]  She did have fall at barber shop 06/2018 and hit her head at that time but doesn't remember injuring back.  She has not been taking calcium/vit D supplement. She avoids lactose.  She has never been on bone strengthening medication.   Recent CT scan obtained for ongoing weight loss overall reassuring, did show large exophytic 2.1cm upper right renal cortical lesion - rec MRI for further characterization.   She agrees to MRI.   Weight is up - she has been drinking boost/ensure  She is also taking low dose levothyroxine regularly.  Lab Results  Component Value Date   TSH 2.38 02/24/2020       Relevant past medical, surgical, family and social history reviewed and updated as indicated. Interim medical history since our last visit reviewed. Allergies and medications reviewed and updated. Outpatient Medications Prior to Visit  Medication Sig Dispense Refill  . atorvastatin (LIPITOR) 40 MG tablet Take 1 tablet (40 mg total) by mouth daily. 90 tablet 3  . Calcium Carb-Cholecalciferol (CALCIUM-VITAMIN D) 600-400 MG-UNIT TABS Take 1 tablet by mouth 2 (two) times daily.     . cetirizine (ZYRTEC) 10 MG tablet Take 10 mg by mouth daily.    . clopidogrel (PLAVIX) 75 MG tablet Take 1 tablet  (75 mg total) by mouth daily. 90 tablet 3  . co-enzyme Q-10 30 MG capsule Take 30 mg by mouth daily.    . hydrocortisone 2.5 % cream Apply 1 application topically 2 (two) times daily as needed (itching).    [name] levothyroxine (SYNTHROID) 25 MCG tablet Take 1 tablet (25 mcg total) by mouth daily before breakfast. 90 tablet 3  . Misc Natural Products (BLACK CHERRY CONCENTRATE PO) Take 1 tablet by mouth daily.     . Potassium Chloride (K-TABS PO) Take 1 tablet by mouth daily.    . Pyridoxine HCl (VITAMIN B-6 PO) Take 1 tablet by mouth daily.    [name] triamcinolone (KENALOG) 0.147 MG/GM topical spray Apply topically 2 (two) times daily as needed. For psoriasis 63 g 1  . vitamin B-12 (CYANOCOBALAMIN) 500 MCG tablet Take 1 tablet (500 mcg total) by mouth every Monday, Wednesday, and Friday.     No facility-administered medications prior to visit.     Per HPI unless specifically indicated in ROS section below Review of Systems Objective:  BP 136/72   Pulse 87   Temp 97.6 F (36.4 C) (Temporal)   Ht 5\' 1"  (1.549 m)   Wt 124 lb 4 oz (56.4 kg)   SpO2 96%   BMI 23.48 kg/m   Wt Readings from Last 3 Encounters:  03/23/20 124 lb 4 oz (56.4  kg)  02/24/20 121 lb 9 oz (55.1 kg)  10/18/19 129 lb 5 oz (58.7 kg)      Physical Exam Vitals and nursing note reviewed.  Constitutional:      Appearance: Normal appearance. She is not ill-appearing.  Neurological:     Mental Status: She is alert.  Psychiatric:        Mood and Affect: Mood normal.        Behavior: Behavior normal.       Assessment & Plan:  This visit occurred during the SARS-CoV-2 public health emergency.  Safety protocols were in place, including screening questions prior to the visit, additional usage of staff PPE, and extensive cleaning of exam room while observing appropriate contact time as indicated for disinfecting solutions.   Problem List Items Addressed This Visit    Unintentional weight loss    Some weight gain noted. Reviewed  ongoing diet efforts.      Osteoporosis - Primary    Discussed OP diagnosis based on recently noted compression fracture at T10. Reviewed treatment options including estrogenic meds and bisphosphonates vs prolia. Avoid bisphosphonate given newly noted small-mod HH with possible esophageal dysmotility by CT. Reviewed calcium/vit D supplementation as well as regular weight bearing exercise.  She is interested in prolia - will ask my office to price out for her.       Compression fracture of thoracic vertebra (HCC)    Incidentally noted on CXR.       Atherosclerosis of aorta Marilyn Cook)    Reviewed with patient. Continue atorvastatin.       Hiatal hernia    Reviewed with patient - small-mod HH with evidence of esophageal dysmotility by recent CT. She denies symptoms of GERD or dysphagia.  Will monitor for now.        Other Visit Diagnoses    Other specified disorders of kidney and ureter       Relevant Orders   MR Abdomen W Wo Contrast       No orders of the defined types were placed in this encounter.  Orders Placed This Encounter  Procedures  . MR Abdomen W Wo Contrast    Pt would like scheduled on Mon or Fri PM    Standing Status:   Future    Standing Expiration Date:   03/23/2021    Order Specific Question:   If indicated for the ordered procedure, I authorize the administration of contrast media per Radiology protocol    Answer:   Yes    Order Specific Question:   What is the patient's sedation requirement?    Answer:   No Sedation    Order Specific Question:   Does the patient have a pacemaker or implanted devices?    Answer:   No    Order Specific Question:   Preferred imaging location?    Answer:   [facility] (table limit-350lbs)    Patient instructions: Call to schedule eye exam.  We will order MRI to check spot on right kidney.  For new osteoporosis - restart calcium and vitamin D supplement regularly twice daily. Work on regular walking routine.  We will  price out prolia for you.  Return as needed or in 3-4 months for follow up visit.   Follow up plan: Return if symptoms worsen or fail to improve.  Marilyn Bush, MD

## 2020-03-23 NOTE — Assessment & Plan Note (Signed)
Reviewed with patient. Continue atorvastatin.

## 2020-04-06 ENCOUNTER — Other Ambulatory Visit: Payer: Self-pay

## 2020-04-06 ENCOUNTER — Ambulatory Visit
Admission: RE | Admit: 2020-04-06 | Discharge: 2020-04-06 | Disposition: A | Discharge status: Home / Self Care | Payer: Medicare HMO | Source: Ambulatory Visit | Attending: Family Medicine | Admitting: Family Medicine

## 2020-04-06 DIAGNOSIS — K5750 Diverticulosis of both small and large intestine without perforation or abscess without bleeding: Secondary | ICD-10-CM | POA: Diagnosis not present

## 2020-04-06 DIAGNOSIS — N2889 Other specified disorders of kidney and ureter: Secondary | ICD-10-CM | POA: Diagnosis not present

## 2020-04-06 DIAGNOSIS — K449 Diaphragmatic hernia without obstruction or gangrene: Secondary | ICD-10-CM | POA: Diagnosis not present

## 2020-04-06 DIAGNOSIS — N281 Cyst of kidney, acquired: Secondary | ICD-10-CM | POA: Diagnosis not present

## 2020-04-06 MED ORDER — GADOBUTROL 1 MMOL/ML IV SOLN
5.0000 mL | Freq: Once | INTRAVENOUS | Status: AC | PRN
  Administered 2020-04-06: 5 mL via INTRAVENOUS

## 2020-04-25 NOTE — Telephone Encounter (Signed)
Received fax reporting PA has been approved for prolia with dates of 04/16/20-04/16/21 and is for 2 visits within this time period. A copy has been places in the scan folder and in the prolia book.  Prolia book also reports that the benefits run have been interpreted by Joycelyn Schmid and prolia would cost pt $280.00

## 2020-04-26 NOTE — Telephone Encounter (Signed)
Spoke with patient and explained everything to her. Patient would like to think about this and talk to her friend. I advised patient that I would follow up with her next week

## 2020-05-03 NOTE — Telephone Encounter (Signed)
Spoke with patient. Patient wanted to know if she can do an oral medication instead and it would be cheaper. Please let patient know when able to for the best plan. Thank you.

## 2020-05-03 NOTE — Telephone Encounter (Signed)
Called patient but not able to leave a message, called [name]- patient's son, and asked to have patient to call me back

## 2020-05-07 NOTE — Telephone Encounter (Addendum)
Prolia unaffordable.  I hesitate to use fosamax given GI issues ([place] with possible esophageal dysmotility or GERD).   I'd like her or son to call insurance to price out zolendronic acid (Reclast) 5mg  IV yearly infusion. We can do this infusion yearly at [facility].   If that's too expensive, then recommend they price out Evista estrogen like medication.

## 2020-05-08 NOTE — Telephone Encounter (Signed)
Spoke with patient and relayed information.  Patient decided to proceed with Prolia injection. She was going to look at her schedule and call back to scheduled. I will call patient again next week to try and get her on the schedule.

## 2020-05-08 NOTE — Telephone Encounter (Signed)
Left a message on voicemail for patient to call the office back. 

## 2020-05-23 NOTE — Telephone Encounter (Signed)
Called patient and it just kept ringing without voicemail. Will try again later

## 2020-05-29 NOTE — Telephone Encounter (Signed)
Spoke with patient and scheduled for labs on 6/7 and will get her Prolia injection at the time of her visit on 6/20 with Dr Darnell Level.

## 2020-05-29 NOTE — Addendum Note (Signed)
Addended by: Kris Mouton on: 05/29/2020 03:41 PM   Modules accepted: Orders

## 2020-06-07 ENCOUNTER — Other Ambulatory Visit (INDEPENDENT_AMBULATORY_CARE_PROVIDER_SITE_OTHER): Payer: Medicare HMO

## 2020-06-07 ENCOUNTER — Other Ambulatory Visit: Payer: Self-pay

## 2020-06-07 DIAGNOSIS — M810 Age-related osteoporosis without current pathological fracture: Secondary | ICD-10-CM

## 2020-06-08 LAB — BASIC METABOLIC PANEL
BUN: 11 mg/dL (ref 6–23)
CO2: 30 mEq/L (ref 19–32)
Calcium: 8.9 mg/dL (ref 8.4–10.5)
Chloride: 105 mEq/L (ref 96–112)
Creatinine, Ser: 0.77 mg/dL (ref 0.40–1.20)
GFR: 75.31 mL/min (ref >60.00)
Glucose, Bld: 111 mg/dL — ABNORMAL HIGH (ref 70–99)
Potassium: 4 mEq/L (ref 3.5–5.1)
Sodium: 142 mEq/L (ref 135–145)

## 2020-06-12 ENCOUNTER — Other Ambulatory Visit: Payer: Medicare HMO

## 2020-06-13 NOTE — Telephone Encounter (Signed)
Ok to proceed with prolia at upcoming appt. Thanks.

## 2020-06-13 NOTE — Telephone Encounter (Signed)
noted 

## 2020-06-13 NOTE — Telephone Encounter (Signed)
Labs done on 06/12/20 CrCal is : 56.21 mL/min-under 60 ml/min that is recommended, sending to Dr Darnell Level to make sure still ok to proceed with Prolia. Calcium was 8.9-normal  Patient has an appointment with Dr Darnell Level on 06/25/20 and is planning on getting her Prolia then.

## 2020-06-25 ENCOUNTER — Encounter: Payer: Self-pay | Admitting: Family Medicine

## 2020-06-25 ENCOUNTER — Other Ambulatory Visit: Payer: Self-pay

## 2020-06-25 ENCOUNTER — Ambulatory Visit (INDEPENDENT_AMBULATORY_CARE_PROVIDER_SITE_OTHER): Payer: Medicare HMO | Admitting: Family Medicine

## 2020-06-25 VITALS — BP 140/82 | HR 77 | Temp 98.0°F | Ht 61.0 in | Wt 129.5 lb

## 2020-06-25 DIAGNOSIS — R634 Abnormal weight loss: Secondary | ICD-10-CM

## 2020-06-25 DIAGNOSIS — M810 Age-related osteoporosis without current pathological fracture: Secondary | ICD-10-CM

## 2020-06-25 MED ORDER — DENOSUMAB 60 MG/ML ~~LOC~~ SOSY
60.0000 mg | PREFILLED_SYRINGE | Freq: Once | SUBCUTANEOUS | Status: AC
  Administered 2020-06-25: 60 mg via SUBCUTANEOUS

## 2020-06-25 NOTE — Assessment & Plan Note (Signed)
First prolia injection today. Reviewed mechanism of action of medication as well as importance of trying to receive as close to every 6 months as possible to decrease fracture risk. Discussed cal/vit D intake. RTC 6 mo rpt prolia injection.

## 2020-06-25 NOTE — Patient Instructions (Addendum)
First prolia shot today.  Schedule nurse visit for next one in 6 months.  Ensure you're taking calcium/vitamin D tablets twice daily if you're not getting good calcium in the diet  Good to see you today. Call us with questions  Return as needed or in 6 months for follow up visit.

## 2020-06-25 NOTE — Assessment & Plan Note (Signed)
Weight loss is reversing. Recent imaging largely reassuring.

## 2020-06-25 NOTE — Progress Notes (Signed)
Patient ID: Marilyn Cook, female    DOB: 06/27/1945, 75 y.o.   MRN: [id number]  This visit was conducted in person.  BP 140/82   Pulse 77   Temp 98 F (36.7 C) (Temporal)   Ht 5\' 1"  (1.549 m)   Wt 129 lb 8 oz (58.7 kg)   SpO2 98%   BMI 24.47 kg/m    CC: 3 mo f/u visit  Subjective:   HPI: Marilyn Cook is a 75 y.o. female presenting on 06/25/2020 for Follow-up (Here for 3 mo f/u. )   OP - new start prolia today. Indication h/o compression fractures. Latest DEXA 03/2020.  Not regularly taking cal/vit D.   Recent CT scan obtained for weight loss showed exophytic 2.1cm upper R renal cortical lesion. It also showed moderate HH with possible esophageal dysmotility. MRI reassuring. More weight gain noted today.   MRI abd 04/2020 - R exophytic cyst - benign complex Bosniak 2 cyst - no f/u needed.      Relevant past medical, surgical, family and social history reviewed and updated as indicated. Interim medical history since our last visit reviewed. Allergies and medications reviewed and updated. Outpatient Medications Prior to Visit  Medication Sig Dispense Refill   atorvastatin (LIPITOR) 40 MG tablet Take 1 tablet (40 mg total) by mouth daily. 90 tablet 3   Calcium Carb-Cholecalciferol (CALCIUM-VITAMIN D) 600-400 MG-UNIT TABS Take 1 tablet by mouth 2 (two) times daily.      cetirizine (ZYRTEC) 10 MG tablet Take 10 mg by mouth daily.     clopidogrel (PLAVIX) 75 MG tablet Take 1 tablet (75 mg total) by mouth daily. 90 tablet 3   co-enzyme Q-10 30 MG capsule Take 30 mg by mouth daily.     hydrocortisone 2.5 % cream Apply 1 application topically 2 (two) times daily as needed (itching).     levothyroxine (SYNTHROID) 25 MCG tablet Take 1 tablet (25 mcg total) by mouth daily before breakfast. 90 tablet 3   Misc Natural Products (BLACK CHERRY CONCENTRATE PO) Take 1 tablet by mouth daily.      Pyridoxine HCl (VITAMIN B-6 PO) Take 1 tablet by mouth daily.     triamcinolone (KENALOG) 0.147  MG/GM topical spray Apply topically 2 (two) times daily as needed. For psoriasis 63 g 1   vitamin B-12 (CYANOCOBALAMIN) 500 MCG tablet Take 1 tablet (500 mcg total) by mouth every Monday, Wednesday, and Friday.     Potassium Chloride (K-TABS PO) Take 1 tablet by mouth daily. (Patient not taking: Reported on 06/25/2020)     No facility-administered medications prior to visit.     Per HPI unless specifically indicated in ROS section below Review of Systems Objective:  BP 140/82   Pulse 77   Temp 98 F (36.7 C) (Temporal)   Ht 5\' 1"  (1.549 m)   Wt 129 lb 8 oz (58.7 kg)   SpO2 98%   BMI 24.47 kg/m   Wt Readings from Last 3 Encounters:  06/25/20 129 lb 8 oz (58.7 kg)  03/23/20 124 lb 4 oz (56.4 kg)  02/24/20 121 lb 9 oz (55.1 kg)      Physical Exam Vitals and nursing note reviewed.  Constitutional:      Appearance: Normal appearance. She is not ill-appearing.  Cardiovascular:     Rate and Rhythm: Normal rate and regular rhythm.     Pulses: Normal pulses.     Heart sounds: Normal heart sounds. No murmur heard. Pulmonary:  Effort: Pulmonary effort is normal. No respiratory distress.     Breath sounds: Normal breath sounds. No wheezing, rhonchi or rales.  Musculoskeletal:     Right lower leg: No edema.     Left lower leg: No edema.  Skin:    General: Skin is warm and dry.     Findings: No rash.  Neurological:     Mental Status: She is alert.  Psychiatric:        Mood and Affect: Mood normal.        Behavior: Behavior normal.      Results for orders placed or performed in visit on 56/31/49  Basic Metabolic Panel  Result Value Ref Range   Sodium 142 135 - 145 mEq/L   Potassium 4.0 3.5 - 5.1 mEq/L   Chloride 105 96 - 112 mEq/L   CO2 30 19 - 32 mEq/L   Glucose, Bld 111 (H) 70 - 99 mg/dL   BUN 11 6 - 23 mg/dL   Creatinine, Ser 0.77 0.40 - 1.20 mg/dL   GFR 75.31 >60.00 mL/min   Calcium 8.9 8.4 - 10.5 mg/dL   Assessment & Plan:  This visit occurred during the  SARS-CoV-2 public health emergency.  Safety protocols were in place, including screening questions prior to the visit, additional usage of staff PPE, and extensive cleaning of exam room while observing appropriate contact time as indicated for disinfecting solutions.   Problem List Items Addressed This Visit     Unintentional weight loss    Weight loss is reversing. Recent imaging largely reassuring.        Osteoporosis - Primary    First prolia injection today. Reviewed mechanism of action of medication as well as importance of trying to receive as close to every 6 months as possible to decrease fracture risk. Discussed cal/vit D intake. RTC 6 mo rpt prolia injection.         Meds ordered this encounter  Medications   denosumab (PROLIA) injection 60 mg    Order Specific Question:   Patient is enrolled in [employer] program for this medication and I have provided a copy of the Prolia Medication Guide and Patient Brochure.    Answer:   No    Order Specific Question:   I have reviewed with the patient the information in the Prolia Medication Guide and Patient Counseling Chart including the serious risks of Prolia and symptoms of each risk.    Answer:   No    Order Specific Question:   I have advised the patient to seek medical attention if they have signs or symptoms of any of the serious risks.    Answer:   Yes    No orders of the defined types were placed in this encounter.  Patient Instructions  First prolia shot today.  Schedule nurse visit for next one in 6 months.  Ensure you're taking calcium/vitamin D tablets twice daily if you're not getting good calcium in the diet  Good to see you today. Call us with questions  Return as needed or in 6 months for follow up visit.    Follow up plan: Return in about 6 months (around 12/25/2020), or if symptoms worsen or fail to improve.  Ria Bush, MD

## 2020-06-27 NOTE — Telephone Encounter (Signed)
Pt received Prolia inj at 06/25/20 OV.

## 2020-06-29 DIAGNOSIS — R69 Illness, unspecified: Secondary | ICD-10-CM | POA: Diagnosis not present

## 2020-06-29 LAB — HM MAMMOGRAPHY

## 2020-07-04 ENCOUNTER — Encounter: Payer: Self-pay | Admitting: Family Medicine

## 2020-07-31 DIAGNOSIS — E039 Hypothyroidism, unspecified: Secondary | ICD-10-CM | POA: Diagnosis not present

## 2020-07-31 DIAGNOSIS — R030 Elevated blood-pressure reading, without diagnosis of hypertension: Secondary | ICD-10-CM | POA: Diagnosis not present

## 2020-07-31 DIAGNOSIS — I2510 Atherosclerotic heart disease of native coronary artery without angina pectoris: Secondary | ICD-10-CM | POA: Diagnosis not present

## 2020-07-31 DIAGNOSIS — Z9181 History of falling: Secondary | ICD-10-CM | POA: Diagnosis not present

## 2020-07-31 DIAGNOSIS — E785 Hyperlipidemia, unspecified: Secondary | ICD-10-CM | POA: Diagnosis not present

## 2020-07-31 DIAGNOSIS — Z7902 Long term (current) use of antithrombotics/antiplatelets: Secondary | ICD-10-CM | POA: Diagnosis not present

## 2020-08-10 ENCOUNTER — Other Ambulatory Visit: Payer: Self-pay

## 2020-08-10 ENCOUNTER — Emergency Department
Admission: EM | Admit: 2020-08-10 | Discharge: 2020-08-11 | Disposition: A | Discharge status: Home / Self Care | Payer: Medicare HMO | Attending: Emergency Medicine | Admitting: Emergency Medicine

## 2020-08-10 ENCOUNTER — Emergency Department: Payer: Medicare HMO

## 2020-08-10 ENCOUNTER — Encounter: Payer: Self-pay | Admitting: Emergency Medicine

## 2020-08-10 ENCOUNTER — Telehealth: Payer: Self-pay

## 2020-08-10 DIAGNOSIS — R55 Syncope and collapse: Secondary | ICD-10-CM | POA: Insufficient documentation

## 2020-08-10 DIAGNOSIS — Z5321 Procedure and treatment not carried out due to patient leaving prior to being seen by health care provider: Secondary | ICD-10-CM | POA: Diagnosis not present

## 2020-08-10 DIAGNOSIS — R42 Dizziness and giddiness: Secondary | ICD-10-CM | POA: Insufficient documentation

## 2020-08-10 LAB — CBC
HCT: 41 % (ref 36.0–46.0)
Hemoglobin: 14.6 g/dL (ref 12.0–15.0)
MCH: 34.7 pg — ABNORMAL HIGH (ref 26.0–34.0)
MCHC: 35.6 g/dL (ref 30.0–36.0)
MCV: 97.4 fL (ref 80.0–100.0)
Platelets: 206 10*3/uL (ref 150–400)
RBC: 4.21 MIL/uL (ref 3.87–5.11)
RDW: 12.1 % (ref 11.5–15.5)
WBC: 5.8 10*3/uL (ref 4.0–10.5)
nRBC: 0 % (ref 0.0–0.2)

## 2020-08-10 LAB — BASIC METABOLIC PANEL
Anion gap: 6 (ref 5–15)
BUN: 11 mg/dL (ref 8–23)
CO2: 29 mmol/L (ref 22–32)
Calcium: 8.7 mg/dL — ABNORMAL LOW (ref 8.9–10.3)
Chloride: 105 mmol/L (ref 98–111)
Creatinine, Ser: 0.64 mg/dL (ref 0.44–1.00)
GFR, Estimated: >60 mL/min (ref >60)
Glucose, Bld: 112 mg/dL — ABNORMAL HIGH (ref 70–99)
Potassium: 3.7 mmol/L (ref 3.5–5.1)
Sodium: 140 mmol/L (ref 135–145)

## 2020-08-10 NOTE — Telephone Encounter (Signed)
Pts son brought pt to [facility] after pt was at a friends earlier today around 60 or 11:30 AM. And was standing in friends garage with friend and pts friend said (pts son called the friend on speaker phone.) pts friend said pt was talking and all of a sudden said pts head did not feel right and pt became very pale. Pt felt hot and pt became more pale. Pt was dizzy and sat on stool; pts friend said that pt passed out and lost consciousness for 5  or more mins. Pts friend kept calling her name and pt finally responded. Pts friend said before passing out pt started gagging and vomited x 2 small amts of phelgm; there was no food. Pt did not remember any of the above happeniing. Pt said no H/A, CP,SOB or vision changes today. Pts friend also noted that after pt came to she was able to talk but pt was having difficulty in walking from the garage to inside of house to get in cooler environment. Before coming to office pts son went by EMS and about 4 PM BP 150/80, pulse 100 CBG was 112 and SPO2 was 98. At [facility] about 4;45 pm BP was 158/90 with pt sitting and relaxing and used reg cuff with lt arm. P 93 pulse ox was 97% and temp was 98.3. pts son is taking pt to [facility] ED now.  Sending note to DR G.

## 2020-08-10 NOTE — ED Triage Notes (Signed)
Pt states that she was standing at a neighbors house today, she felt dizzy and had a syncopal episode. She reports that she does not think that she passed out. Her neighbor called her son and he called her PMD and they told her to come here to get a CT of head.

## 2020-08-10 NOTE — ED Notes (Signed)
no answer when called for vitals

## 2020-08-10 NOTE — ED Notes (Signed)
No answer when called for vitals. 

## 2020-08-10 NOTE — Telephone Encounter (Signed)
Noted agree with ER eval for syncopal event

## 2020-08-10 NOTE — ED Triage Notes (Signed)
Pt does take blood thinners. She states that she was hot when it happened,she went into the neighbors house and felt better after cooling off

## 2020-08-13 ENCOUNTER — Telehealth: Payer: Self-pay | Admitting: Family Medicine

## 2020-08-13 ENCOUNTER — Telehealth: Payer: Self-pay

## 2020-08-13 NOTE — Telephone Encounter (Signed)
pt. Marilyn Cook) D.O.B. ( [phone] )came in to pick up some paperwork that was supposed to be transferred here from [facility]..Marland KitchenShe left but will call or return before we close. ([place])

## 2020-08-13 NOTE — Telephone Encounter (Signed)
I spoke with [name] (DPR signed) and pt and he went to [facility] ED on 08/10/20; pt had labs and CT of head and wait was going to be long and pt was advised could FU with PCP with results of testing. [name] said pt did leave before seeing provider at ED and pt did OK over weekend. Today [name] said pt feels pretty good today. no more fainting or N&V. [name] said pt said stomach felt funny;that was all pt could describe and [name] is not sure if pt is eating and drinking appropriately. [name] is having pt write down what she eats and drinks. [name] has not cked BP over weekend and will ck BP and P daily until seen. [name] wants pt to come in and see DR G  and go over ED results. Offered pt sooner appt with another provider but prefers to see Dr Darnell Level. Dr Darnell Level scheduled pt in office appt on 08/15/20 at 12:30; no covid symptoms per [name]. UC & ED precautions given and pt's son voiced understanding. See phone note 08/10/20 and ED note. Sending note to DR G.

## 2020-08-13 NOTE — Telephone Encounter (Signed)
[facility] Night - Client Nonclinical Telephone Record AccessNurse Client [place] Night - Client Client Site [place] Physician Ria Bush - MD Contact Type Call Who Is Calling Patient / Member / Family / Caregiver Caller Name Killeen Phone Number [phone] Patient Name Marilyn Cook Patient DOB Feb 26, 1945 Call Type Message Only Information Provided Reason for Call Request to Schedule Office Appointment Initial Comment Caller states mother had an episode. Patient request to speak to RN No Additional Comment He wants to let the office know the info in the the chart and wants to speak to nurse but declined triage Disp. Time Disposition Final User 08/13/2020 7:51:53 AM General Information Provided Yes Mable Paris Call Closed By: Mable Paris Transaction Date/Time: 08/13/2020 7:49:01 AM (ET)

## 2020-08-13 NOTE — Telephone Encounter (Signed)
Attempted to contact pt.  No answer.  No vm.  Will try again tomorrow to get clarification of message and see what pt is needing.

## 2020-08-14 NOTE — Telephone Encounter (Addendum)
Attempted to contact pt.  No answer.  No vm.  Need clarification of message and see what pt is needing.   Pt has OV tomorrow at 12:30 for ER f/u.

## 2020-08-15 ENCOUNTER — Encounter: Payer: Self-pay | Admitting: Family Medicine

## 2020-08-15 ENCOUNTER — Other Ambulatory Visit: Payer: Self-pay

## 2020-08-15 ENCOUNTER — Ambulatory Visit (INDEPENDENT_AMBULATORY_CARE_PROVIDER_SITE_OTHER): Payer: Medicare HMO | Admitting: Family Medicine

## 2020-08-15 VITALS — BP 150/84 | HR 80 | Temp 97.9°F | Ht 61.0 in | Wt 128.4 lb

## 2020-08-15 DIAGNOSIS — R634 Abnormal weight loss: Secondary | ICD-10-CM

## 2020-08-15 DIAGNOSIS — R42 Dizziness and giddiness: Secondary | ICD-10-CM | POA: Diagnosis not present

## 2020-08-15 DIAGNOSIS — G3184 Mild cognitive impairment, so stated: Secondary | ICD-10-CM | POA: Diagnosis not present

## 2020-08-15 DIAGNOSIS — R404 Transient alteration of awareness: Secondary | ICD-10-CM | POA: Diagnosis not present

## 2020-08-15 NOTE — Patient Instructions (Addendum)
I think combination of dehydration and heat/humidity led to recent episode likely lightheadedness related.  Try to increase water intake by 1 bottle/day. Continue ensure.  Let us know if any recurrent symptoms.  Consider mood medicine to help with anxiety.

## 2020-08-15 NOTE — Progress Notes (Signed)
Patient ID: [name], female    DOB: 1945/05/30, 75 y.o.   MRN: [id number]  This visit was conducted in person.  BP (!) 150/84   Pulse 80   Temp 97.9 F (36.6 C) (Temporal)   Ht '5\' 1"'$  (1.549 m)   Wt 128 lb 6 oz (58.2 kg)   SpO2 97%   BMI 24.26 kg/m   Orthostatic VS for the past 24 hrs (Last 3 readings):  BP- Lying BP- Standing at 3 minutes  08/15/20 1225 -- 130/80  08/15/20 1222 158/90 --   CC: ER f/u visit  Subjective:   HPI: Marilyn Cook is a 75 y.o. female presenting on 08/15/2020 for Hospitalization Follow-up Martin Majestic to [facility] ED on 08/10/20 due to dizziness and syncope.  Had labs and head CT done, but left ED before being seen by ER doc.  Pt accompanied by son, Dellis Filbert- temp 98.3.)   Son lives next door.  Recent ER visit for episode of syncope. Visit reviewed - reassuring head CT and labs, however left prior to being seen due to prolonged wait. EKG overall stable.   Date of event: 08/10/2020 While standing talking with neighbor cousin in cousin's garage, she felt ill (it was a hot day) - walked indoors to sit down. Neighbor said she ended up "passing out" after coming inside - diaphoresis present, she also spit up some phlegm. Drank coke, sat under fan and eventually felt better. Unsure if true syncope vs orthostatic lightheadedness vs just nodding off to sleep.   Since home overall feeling better but having some lower GI upset.  No nausea/vomiting, diarrhea or constipation, abd pain. Normal bowel movements.   Home BP readings over last few days 117-123/70s, HR 87.  Weight has stabilized (prior was losing weight). Continues drinking an ensure daily. She drinks about 4 12oz bottles of water daily.   Some knee discomfort.  Notes ongoing anxiety since husband's passing - with money worries and worries about with living at home alone especially in the dark although son lives next door.      Relevant past medical, surgical, family and social history reviewed and updated as  indicated. Interim medical history since our last visit reviewed. Allergies and medications reviewed and updated. Outpatient Medications Prior to Visit  Medication Sig Dispense Refill   atorvastatin (LIPITOR) 40 MG tablet Take 1 tablet (40 mg total) by mouth daily. 90 tablet 3   Calcium Carb-Cholecalciferol (CALCIUM-VITAMIN D) 600-400 MG-UNIT TABS Take 1 tablet by mouth 2 (two) times daily.      cetirizine (ZYRTEC) 10 MG tablet Take 10 mg by mouth daily.     clopidogrel (PLAVIX) 75 MG tablet Take 1 tablet (75 mg total) by mouth daily. 90 tablet 3   co-enzyme Q-10 30 MG capsule Take 30 mg by mouth daily.     hydrocortisone 2.5 % cream Apply 1 application topically 2 (two) times daily as needed (itching).     levothyroxine (SYNTHROID) 25 MCG tablet Take 1 tablet (25 mcg total) by mouth daily before breakfast. 90 tablet 3   Misc Natural Products (BLACK CHERRY CONCENTRATE PO) Take 1 tablet by mouth daily.      Potassium Chloride (K-TABS PO) Take 1 tablet by mouth daily.     Pyridoxine HCl (VITAMIN B-6 PO) Take 1 tablet by mouth daily.     triamcinolone (KENALOG) 0.147 MG/GM topical spray Apply topically 2 (two) times daily as needed. For psoriasis 63 g 1   vitamin B-12 (CYANOCOBALAMIN) 500 MCG  tablet Take 1 tablet (500 mcg total) by mouth every Monday, Wednesday, and Friday.     No facility-administered medications prior to visit.     Per HPI unless specifically indicated in ROS section below Review of Systems  Objective:  BP (!) 150/84   Pulse 80   Temp 97.9 F (36.6 C) (Temporal)   Ht '5\' 1"'$  (1.549 m)   Wt 128 lb 6 oz (58.2 kg)   SpO2 97%   BMI 24.26 kg/m   Wt Readings from Last 3 Encounters:  08/15/20 128 lb 6 oz (58.2 kg)  08/10/20 129 lb 6.6 oz (58.7 kg)  06/25/20 129 lb 8 oz (58.7 kg)      Physical Exam Vitals and nursing note reviewed.  Constitutional:      Appearance: Normal appearance. She is not ill-appearing.  Neck:     Vascular: No carotid bruit.  Cardiovascular:      Rate and Rhythm: Normal rate and regular rhythm.     Pulses: Normal pulses.     Heart sounds: Normal heart sounds. No murmur heard. Pulmonary:     Effort: Pulmonary effort is normal. No respiratory distress.     Breath sounds: Normal breath sounds. No wheezing, rhonchi or rales.  Abdominal:     General: Bowel sounds are normal. There is no distension.     Palpations: Abdomen is soft. There is no mass.     Tenderness: There is no abdominal tenderness. There is no guarding or rebound.     Hernia: No hernia is present.  Musculoskeletal:     Right lower leg: No edema.     Left lower leg: No edema.  Skin:    General: Skin is warm and dry.     Findings: No rash.  Neurological:     Mental Status: She is alert.  Psychiatric:        Mood and Affect: Mood normal.        Behavior: Behavior normal.      Results for orders placed or performed during the hospital encounter of [id number]  Basic metabolic panel  Result Value Ref Range   Sodium 140 135 - 145 mmol/L   Potassium 3.7 3.5 - 5.1 mmol/L   Chloride 105 98 - 111 mmol/L   CO2 29 22 - 32 mmol/L   Glucose, Bld 112 (H) 70 - 99 mg/dL   BUN 11 8 - 23 mg/dL   Creatinine, Ser 0.64 0.44 - 1.00 mg/dL   Calcium 8.7 (L) 8.9 - 10.3 mg/dL   GFR, Estimated >60 >60 mL/min   Anion gap 6 5 - 15  CBC  Result Value Ref Range   WBC 5.8 4.0 - 10.5 K/uL   RBC 4.21 3.87 - 5.11 MIL/uL   Hemoglobin 14.6 12.0 - 15.0 g/dL   HCT 41.0 36.0 - 46.0 %   MCV 97.4 80.0 - 100.0 fL   MCH 34.7 (H) 26.0 - 34.0 pg   MCHC 35.6 30.0 - 36.0 g/dL   RDW 12.1 11.5 - 15.5 %   Platelets 206 150 - 400 K/uL   nRBC 0.0 0.0 - 0.2 %   Assessment & Plan:  This visit occurred during the SARS-CoV-2 public health emergency.  Safety protocols were in place, including screening questions prior to the visit, additional usage of staff PPE, and extensive cleaning of exam room while observing appropriate contact time as indicated for disinfecting solutions.   Problem List Items  Addressed This Visit     Unintentional weight loss  This seems to have stabilized over the last few months. Encouraged continue ensure use and regular meals - son continues monitoring her meal intake closely.       MCI (mild cognitive impairment) with memory loss    Significant anxiety could contribute to memory concerns, discussed could consider trial antidepressant.  Not consistent with parkinsonism - will continue to monitor.       Transient alteration of awareness - Primary    I don't think this was true syncope but rather symptoms related to orthostasis and dehydration.  Discussed importance of good hydration status - rec increase water intake by 1 bottle per day to start. Will not proceed with further testing at this time. To let us know however if recurrent symptoms, low threshold to consider carotid US, echo.       Orthostatic lightheadedness    28 point drop in systolic bp noted today. She is not on antihypertensive. Encouraged renewed efforts at good hydration status, reviewed importance of avoiding dehydration.         No orders of the defined types were placed in this encounter.  No orders of the defined types were placed in this encounter.   Patient Instructions  I think combination of dehydration and heat/humidity led to recent episode likely lightheadedness related.  Try to increase water intake by 1 bottle/day. Continue ensure.  Let us know if any recurrent symptoms.  Consider mood medicine to help with anxiety.   Follow up plan: Return in about 6 months (around 02/24/2021), or if symptoms worsen or fail to improve, for medicare wellness visit, annual exam, prior fasting for blood work.  Ria Bush, MD

## 2020-08-16 DIAGNOSIS — R42 Dizziness and giddiness: Secondary | ICD-10-CM

## 2020-08-16 DIAGNOSIS — R404 Transient alteration of awareness: Secondary | ICD-10-CM | POA: Insufficient documentation

## 2020-08-16 HISTORY — DX: Dizziness and giddiness: R42

## 2020-08-16 NOTE — Assessment & Plan Note (Addendum)
Significant anxiety could contribute to memory concerns, discussed could consider trial antidepressant.  Not consistent with parkinsonism - will continue to monitor.

## 2020-08-16 NOTE — Assessment & Plan Note (Addendum)
28 point drop in systolic bp noted today. She is not on antihypertensive. Encouraged renewed efforts at good hydration status, reviewed importance of avoiding dehydration.

## 2020-08-16 NOTE — Assessment & Plan Note (Signed)
I don't think this was true syncope but rather symptoms related to orthostasis and dehydration.  Discussed importance of good hydration status - rec increase water intake by 1 bottle per day to start. Will not proceed with further testing at this time. To let us know however if recurrent symptoms, low threshold to consider carotid US, echo.

## 2020-08-16 NOTE — Assessment & Plan Note (Addendum)
This seems to have stabilized over the last few months. Encouraged continue ensure use and regular meals - son continues monitoring her meal intake closely.

## 2020-08-20 ENCOUNTER — Ambulatory Visit: Payer: Medicare HMO | Admitting: Family Medicine

## 2020-09-03 ENCOUNTER — Telehealth: Payer: Self-pay | Admitting: *Deleted

## 2020-09-03 NOTE — Telephone Encounter (Signed)
PLEASE NOTE: All timestamps contained within this report are represented as [place] Standard Time. CONFIDENTIALTY NOTICE: This fax transmission is intended only for the addressee. It contains information that is legally privileged, confidential or otherwise protected from use or disclosure. If you are not the intended recipient, you are strictly prohibited from reviewing, disclosing, copying using or disseminating any of this information or taking any action in reliance on or regarding this information. If you have received this fax in error, please notify us immediately by telephone so that we can arrange for its return to [place]. Phone: [phone], Toll-Free: [phone], Fax: [phone] Page: 1 of 1 Call Id: [id number] [place] Night - Client Nonclinical Telephone Record  AccessNurse Client [place] Night - Client Client Site [place] Physician Ria Bush - MD Contact Type Call Who Is Calling Patient / Member / Family / Caregiver Caller Name Lamy Phone Number [phone] Patient Name Marilyn Cook Patient DOB 1945-07-12 Call Type Message Only Information Provided Reason for Call Medication Question / Request Initial Comment Needing to get refill on her Levothyroxine 40mg and Clotidogrel '75mg'$  [facility] Disp. Time Disposition Final User 09/03/2020 8:14:49 AM General Information Provided Yes CSilvano RuskCall Closed By: RSilvano RuskTransaction Date/Time: 09/03/2020 8:08:08 AM (ET)

## 2020-09-03 NOTE — Telephone Encounter (Signed)
Patient called for refills.

## 2020-09-04 NOTE — Telephone Encounter (Signed)
According to pt's chart, there are refills available for both levothyroxine and clopidogrel until 02/2021.  Pt needs to call [facility] to ask them to refill meds.  Also, pt needs to be reminded to always call the pharmacy first to request refills.   Attempted to contact pt.  No answer.  Not able to lvm.  Need to relay message above.

## 2020-09-05 NOTE — Telephone Encounter (Signed)
Spoke with patient and advised of everything as listed below. Patient states she already picked up her Rxs. Reminded patient to call pharmacy first and speak with a pharmacist

## 2020-11-20 ENCOUNTER — Telehealth: Payer: Self-pay

## 2020-11-20 DIAGNOSIS — M810 Age-related osteoporosis without current pathological fracture: Secondary | ICD-10-CM

## 2020-11-20 NOTE — Telephone Encounter (Signed)
Benefits submitted. Next injection due after 12/26/20

## 2020-12-12 NOTE — Addendum Note (Signed)
Addended by: Kris Mouton on: 12/12/2020 04:41 PM   Modules accepted: Orders

## 2020-12-12 NOTE — Telephone Encounter (Signed)
OOP cost is $295. PA has been approved for prolia with dates of 04/16/20-04/16/21 Lab appt on 12/18/20 and Nurse Visit on 12/27/20 at [facility] location

## 2020-12-18 ENCOUNTER — Other Ambulatory Visit: Payer: Medicare HMO

## 2020-12-19 NOTE — Telephone Encounter (Signed)
Patient advised and rescheduled to 12/24/20

## 2020-12-19 NOTE — Telephone Encounter (Signed)
Patient missed her lab appointment that was scheduled for 12/18/20. I called but could not leave a message. Will try again later. Need to reschedule lab appt

## 2020-12-20 ENCOUNTER — Telehealth: Payer: Self-pay

## 2020-12-20 NOTE — Telephone Encounter (Signed)
[facility] Night - Client Nonclinical Telephone Record  Access[facility]urse Client [employer] [facility] Night - Client Client Site [place] Provider Ria Bush - MD Contact Type Call Who Is Calling Patient / Member / Family / Caregiver Caller Name Wilton Manors Phone Number [phone] Call Type Message Only Information Provided Reason for Call Returning a Call from the Office Initial [place] states she thinks her appointment for a shot has been changed. Could the office call the pt back? Additional Comment Caller is asking if the office changed her appointment to get a shot? Could the office call the pt back? Disp. Time Disposition Final User 12/19/2020 5:07:04 PM General Information Provided Yes King-Hussey, Berdi Call Closed By: Bonnita Nasuti Transaction Date/Time: 12/19/2020 5:00:47 PM (ET

## 2020-12-20 NOTE — Telephone Encounter (Signed)
Called pt. We went over her appts and the location

## 2020-12-24 ENCOUNTER — Other Ambulatory Visit (INDEPENDENT_AMBULATORY_CARE_PROVIDER_SITE_OTHER): Payer: Medicare HMO

## 2020-12-24 ENCOUNTER — Other Ambulatory Visit: Payer: Self-pay

## 2020-12-24 DIAGNOSIS — M810 Age-related osteoporosis without current pathological fracture: Secondary | ICD-10-CM

## 2020-12-25 LAB — BASIC METABOLIC PANEL
BUN: 7 mg/dL (ref 6–23)
CO2: 30 mEq/L (ref 19–32)
Calcium: 9.1 mg/dL (ref 8.4–10.5)
Chloride: 104 mEq/L (ref 96–112)
Creatinine, Ser: 0.91 mg/dL (ref 0.40–1.20)
GFR: 61.4 mL/min (ref >60.00)
Glucose, Bld: 114 mg/dL — ABNORMAL HIGH (ref 70–99)
Potassium: 3.6 mEq/L (ref 3.5–5.1)
Sodium: 142 mEq/L (ref 135–145)

## 2020-12-26 NOTE — Telephone Encounter (Signed)
CrCl is  48.32 mL/min.  Calcium normal 142

## 2020-12-27 ENCOUNTER — Telehealth: Payer: Self-pay

## 2020-12-27 ENCOUNTER — Ambulatory Visit (INDEPENDENT_AMBULATORY_CARE_PROVIDER_SITE_OTHER): Payer: Medicare HMO

## 2020-12-27 ENCOUNTER — Other Ambulatory Visit: Payer: Self-pay

## 2020-12-27 DIAGNOSIS — M810 Age-related osteoporosis without current pathological fracture: Secondary | ICD-10-CM

## 2020-12-27 MED ORDER — DENOSUMAB 60 MG/ML ~~LOC~~ SOSY
60.0000 mg | PREFILLED_SYRINGE | Freq: Once | SUBCUTANEOUS | Status: AC
  Administered 2020-12-27: 15:00:00 60 mg via SUBCUTANEOUS

## 2020-12-27 NOTE — Telephone Encounter (Signed)
Called patient to let her know she missed her Prolia appointment today. Need to reschedule and get patient in before the end of the year if possible.  I could not leave a message for patient to call back. Kept ringing.

## 2020-12-27 NOTE — Progress Notes (Signed)
Per orders of Dr. Glori Bickers, in Dr. Danise Mina' absence, injection of Prolia given by Loreen Freud. Patient tolerated injection well.

## 2021-02-12 ENCOUNTER — Other Ambulatory Visit: Payer: Self-pay

## 2021-02-15 ENCOUNTER — Other Ambulatory Visit: Payer: Self-pay

## 2021-02-15 ENCOUNTER — Encounter: Payer: Self-pay | Admitting: Family Medicine

## 2021-02-15 ENCOUNTER — Ambulatory Visit (INDEPENDENT_AMBULATORY_CARE_PROVIDER_SITE_OTHER): Payer: Medicare HMO | Admitting: Family Medicine

## 2021-02-15 VITALS — BP 126/82 | HR 80 | Temp 97.7°F | Ht 61.0 in | Wt 125.5 lb

## 2021-02-15 DIAGNOSIS — E039 Hypothyroidism, unspecified: Secondary | ICD-10-CM

## 2021-02-15 DIAGNOSIS — E785 Hyperlipidemia, unspecified: Secondary | ICD-10-CM

## 2021-02-15 DIAGNOSIS — M810 Age-related osteoporosis without current pathological fracture: Secondary | ICD-10-CM

## 2021-02-15 MED ORDER — CLOPIDOGREL BISULFATE 75 MG PO TABS: 75.0000 mg | ORAL_TABLET | Freq: Every day | ORAL | 3 refills | Status: DC

## 2021-02-15 MED ORDER — ATORVASTATIN CALCIUM 40 MG PO TABS: 40.0000 mg | ORAL_TABLET | Freq: Every day | ORAL | 3 refills | Status: DC

## 2021-02-15 MED ORDER — LEVOTHYROXINE SODIUM 25 MCG PO TABS: 25.0000 ug | ORAL_TABLET | Freq: Every day | ORAL | 3 refills | Status: DC

## 2021-02-15 NOTE — Progress Notes (Unsigned)
Subjective:   Marilyn Cook is a 76 y.o. female who presents for Medicare Annual (Subsequent) preventive examination.  I connected with Marilyn Cook today by telephone and verified that I am speaking with the correct person using two identifiers. Location patient: home Location provider: work Persons participating in the virtual visit: patient, Marine scientist.    I discussed the limitations, risks, security and privacy concerns of performing an evaluation and management service by telephone and the availability of in person appointments. I also discussed with the patient that there may be a patient responsible charge related to this service. The patient expressed understanding and verbally consented to this telephonic visit.    Interactive audio and video telecommunications were attempted between this provider and patient, however failed, due to patient having technical difficulties OR patient did not have access to video capability.  We continued and completed visit with audio only.  Some vital signs may be absent or patient reported.   Time Spent with patient on telephone encounter: *** minutes  Review of Systems           Objective:    There were no vitals filed for this visit. There is no height or weight on file to calculate BMI.  Advanced Directives 08/10/2020 02/17/2020 02/14/2019 11/14/2014 10/30/2014 10/23/2014 06/26/2014  Does Patient Have a Medical Advance Directive? Yes Yes Yes No No Yes No  Type of Paramedic of [place];Living will [place];Living will - - [place];Living will -  Copy of [place] in Chart? - Yes - validated most recent copy scanned in chart (See row information) Yes - validated most recent copy scanned in chart (See row information) - - - -  Would patient like information on creating a medical advance directive? - - - Yes - Educational materials given - -  -    Current Medications (verified) Outpatient Encounter Medications as of 02/18/2021  Medication Sig   atorvastatin (LIPITOR) 40 MG tablet Take 1 tablet (40 mg total) by mouth daily.   Calcium Carb-Cholecalciferol (CALCIUM-VITAMIN D) 600-400 MG-UNIT TABS Take 1 tablet by mouth 2 (two) times daily.    cetirizine (ZYRTEC) 10 MG tablet Take 10 mg by mouth daily.   clopidogrel (PLAVIX) 75 MG tablet Take 1 tablet (75 mg total) by mouth daily.   co-enzyme Q-10 30 MG capsule Take 30 mg by mouth daily.   FLUAD QUADRIVALENT 0.5 ML injection    hydrocortisone 2.5 % cream Apply 1 application topically 2 (two) times daily as needed (itching).   levothyroxine (SYNTHROID) 25 MCG tablet Take 1 tablet (25 mcg total) by mouth daily before breakfast.   Misc Natural Products (BLACK CHERRY CONCENTRATE PO) Take 1 tablet by mouth daily.    Potassium Chloride (K-TABS PO) Take 1 tablet by mouth daily.   Pyridoxine HCl (VITAMIN B-6 PO) Take 1 tablet by mouth daily.   triamcinolone (KENALOG) 0.147 MG/GM topical spray Apply topically 2 (two) times daily as needed. For psoriasis   vitamin B-12 (CYANOCOBALAMIN) 500 MCG tablet Take 1 tablet (500 mcg total) by mouth every Monday, Wednesday, and Friday.   No facility-administered encounter medications on file as of 02/18/2021.    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   Complication of anesthesia    hard to wake up  Hyperlipemia    NSVD (normal spontaneous vaginal delivery)    x 1   Osteopenia 08/2012   spine WNL, hip T -1.2   Scalp psoriasis    Stroke (Derma)    possible TIA in 06/2014   TIA (transient ischemic attack) 06/08/2014   Past Surgical History:  Procedure Laterality Date   ABDOMINAL HYSTERECTOMY     COLONOSCOPY  04/2015   diverticulosis, int hem, rpt 10 yrs  (Nandigam)   DEXA  08/2012   spine WNL, hip T -1.2   PARTIAL HYSTERECTOMY  1987   due to fibroids, ovaries remain   R Lat Epicondyle Injections  1998   SHOULDER ARTHROSCOPY Right 11/2014   arthroscopic debridement and mini open rotator cuff repair of R shoulder for chronic impingement and rotator cuff tear by MRI [facility])   Spiral CT  04/02   (-)   Family History  Problem Relation Age of Onset   Heart disease Father        MI    Stroke Father        CVA   Stroke Mother        CVA   Hypertension Sister    Cancer Sister        ovarian and colon cancer   Aneurysm Brother    Hypertension Brother    Hypertension Brother    Cancer Paternal Aunt        ? colon   Cancer Paternal Grandfather        liver, alcohol   Alcohol abuse Paternal Grandfather    Cancer Other        prostate   Cancer Maternal Aunt        breast   Social History   Socioeconomic History   Marital status: Married    Spouse name: Not on file   Number of children: 1   Years of education: Not on file   Highest education level: Not on file  Occupational History   Occupation: HR    Employer: [employer]    Comment: new hires  Tobacco Use   Smoking status: Never   Smokeless tobacco: Never  Substance and Sexual Activity   Alcohol use: No    Alcohol/week: 0.0 standard drinks   Drug use: No   Sexual activity: Yes  Other Topics Concern   Not on file  Social History Narrative   Widow - husband Marilyn Cook died from cardiac arrest 03/22/19   One son   Occupation: retired from working at [employer] at [employer]   Activity: no regular exercise   Diet: some water, fruits/vegetables daily      Advanced directives: discussed - HCPOA unsure will consider this. Needs to set up.   Social Determinants of Health   Financial Resource Strain: Low Risk    Difficulty of Paying Living Expenses: Not hard at all  Food Insecurity: No Food Insecurity   Worried About Charity fundraiser in the Last Year: Never true   [place] in the Last Year: Never true  Transportation Needs: No Transportation Needs   Lack of Transportation (Medical): No   Lack of Transportation (Non-Medical): No  Physical Activity: Inactive   Days of Exercise per Week: 0 days   Minutes of Exercise per Session: 0 min  Stress: No Stress Concern Present   Feeling of Stress : Not at all  Social Connections: Not on file    Tobacco Counseling Counseling given: Not Answered   Clinical Intake:  Diabetic? No         Activities of Daily Living In your present state of health, do you have any difficulty performing the following activities: 02/17/2020  Hearing? N  Vision? N  Difficulty concentrating or making decisions? Y  Comment some memory issues noted  Walking or climbing stairs? N  Dressing or bathing? N  Doing errands, shopping? N  Preparing Food and eating ? N  Using the Toilet? N  In the past six months, have you accidently leaked urine? N  Do you have problems with loss of bowel control? N  Managing your Medications? N  Managing your Finances? N  Housekeeping or managing your Housekeeping? N  Some recent data might be hidden    Patient Care Team: Ria Bush, MD as PCP - General (Family Medicine)  Indicate any recent Medical Services you may have received from other than [facility] providers in the past year (date may be approximate).     Assessment:   This is a routine wellness examination for [facility].  Hearing/Vision screen No results found.  Dietary issues and exercise activities discussed:     Goals Addressed   None    Depression Screen PHQ 2/9 Scores 02/17/2020 08/31/2019 02/14/2019 02/05/2018 02/04/2017 08/04/2012  PHQ - 2 Score 0 1 0 0 4 0  PHQ- 9 Score 0 4 0 - 16 -    Fall Risk Fall Risk  02/17/2020 02/14/2019 02/05/2018 02/02/2015 08/04/2012  Falls in the past year? 1 1 0 No No  Comment - tripped and fell - - -  Number falls in past yr: 0 1 - - -  Injury with Fall? 0 0 - - -   Risk for fall due to : No Fall Risks Medication side effect - - -  Follow up Falls evaluation completed;Falls prevention discussed Falls evaluation completed;Falls prevention discussed - - -    FALL RISK PREVENTION PERTAINING TO THE HOME:  Any stairs in or around the home? {YES/NO:21197} If so, are there any without handrails? {YES/NO:21197} Home free of loose throw rugs in walkways, pet beds, electrical cords, etc? {YES/NO:21197} Adequate lighting in your home to reduce risk of falls? {YES/NO:21197}  ASSISTIVE DEVICES UTILIZED TO PREVENT FALLS:  Life alert? {YES/NO:21197} Use of a cane, walker or w/c? {YES/NO:21197} Grab bars in the bathroom? {YES/NO:21197} Shower chair or bench in shower? {YES/NO:21197} Elevated toilet seat or a handicapped toilet? {YES/NO:21197}  TIMED UP AND GO:  Was the test performed? {YES/NO:21197}.  Length of time to ambulate 10 feet: *** sec.   {Appearance of Gait:2101803}  Cognitive Function: MMSE - Mini Mental State Exam 02/17/2020 02/14/2019  Not completed: Unable to complete -  Orientation to time - 5  Orientation to Place - 5  Registration - 3  Attention/ Calculation - 5  Recall - 3  Language- repeat - 1        Immunizations Immunization History  Administered Date(s) Administered   Fluad Quad(high Dose 65+) 10/07/2018, 09/13/2019   Influenza Inj Mdck Quad Pf 10/04/2017   Influenza,inj,Quad PF,6+ Mos 10/08/2015   Influenza-Unspecified 09/07/2014, 10/04/2016   PFIZER(Purple Top)SARS-COV-2 Vaccination 01/25/2019, 02/12/2019   Pneumococcal Conjugate-13 01/23/2015   Pneumococcal Polysaccharide-23 07/31/2011   Td 10/12/2000   Tdap 07/31/2011   Zoster Recombinat (Shingrix) 07/21/2017, 11/07/2017   Zoster, Live 11/23/2015    TDAP status: Up to date  {Flu Vaccine status:2101806}  Pneumococcal vaccine status: Up to date  {Covid-19 vaccine status:2101808}  Qualifies for Shingles Vaccine? Yes   Zostavax completed Yes  Shingrix  Completed?: Yes  Screening Tests Health Maintenance  Topic Date Due   COVID-19 Vaccine (3 - Booster for Pfizer series) 04/09/2019   INFLUENZA VACCINE  08/06/2020   MAMMOGRAM  06/29/2021   TETANUS/TDAP  07/30/2021   Pneumonia Vaccine 109+ Years old  Completed   DEXA SCAN  Completed   Hepatitis C Screening  Completed   Zoster Vaccines- Shingrix  Completed   HPV VACCINES  Aged Out   COLONOSCOPY (Pts 45-75yrs Insurance coverage will need to be confirmed)  [place] Maintenance Due  Topic Date Due   COVID-19 Vaccine (3 - Booster for [place] series) 04/09/2019   INFLUENZA VACCINE  08/06/2020    Colorectal cancer screening: No longer required.   Mammogram status: Completed 06/29/20. Repeat every year  Bone Density status: Completed 03/08/20. Results reflect: Bone density results: OSTEOPOROSIS. Repeat every 2 years.  Lung Cancer Screening: (Low Dose CT Chest recommended if Age 64-80 years, 30 pack-year currently smoking OR have quit w/in 15years.) does not qualify.    Additional Screening:  Hepatitis C Screening: does not qualify  Vision Screening: Recommended annual ophthalmology exams for early detection of glaucoma and other disorders of the eye. Is the patient up to date with their annual eye exam?  {YES/NO:21197} Who is the provider or what is the name of the office in which the patient attends annual eye exams? *** If pt is not established with a provider, would they like to be referred to a provider to establish care? {YES/NO:21197}.   Dental Screening: Recommended annual dental exams for proper oral hygiene  Community Resource Referral / Chronic Care Management: CRR required this visit?  {YES/NO:21197}  CCM required this visit?  {YES/NO:21197}     Plan:     I have personally reviewed and noted the following in the patients chart:   Medical and social history Use of alcohol, tobacco or illicit drugs  Current medications and  supplements including opioid prescriptions.  Functional ability and status Nutritional status Physical activity Advanced directives List of other physicians Hospitalizations, surgeries, and ER visits in previous 12 months Vitals Screenings to include cognitive, depression, and falls Referrals and appointments  In addition, I have reviewed and discussed with patient certain preventive protocols, quality metrics, and best practice recommendations. A written personalized care plan for preventive services as well as general preventive health recommendations were provided to patient.   Due to this being a telephonic visit, the after visit summary with patients personalized plan was offered to patient via mail or my-chart. ***Patient declined at this time./ Patient would like to access on my-chart/ per request, patient was mailed a copy of AVS./ Patient preferred to pick up at office at next visit.   Loma Messing, LPN   [id number]   Nurse Health Advisor  Nurse Notes: none

## 2021-02-15 NOTE — Addendum Note (Signed)
Addended by: Ellamae Sia on: 02/15/2021 04:06 PM   Modules accepted: Orders

## 2021-02-15 NOTE — Assessment & Plan Note (Signed)
Update FLPL on atorvastatin 40mg  daily. The 10-year ASCVD risk score (Arnett DK, et al., 2019) is: 16.9%   Values used to calculate the score:     Age: 76 years     Sex: Female     Is Non-Hispanic African American: No     Diabetic: No     Tobacco smoker: No     Systolic Blood Pressure: 016 mmHg     Is BP treated: No     HDL Cholesterol: 50.3 mg/dL     Total Cholesterol: 139 mg/dL

## 2021-02-15 NOTE — Assessment & Plan Note (Signed)
Continue levothyroxine 63mcg daily. Update TSH and fT4 today.

## 2021-02-15 NOTE — Patient Instructions (Addendum)
Good to see you today.  You have a phone call scheduled for wellness visit at 1pm on Monday Feb 13th.  Return in 1 month for physical.  Labs today

## 2021-02-15 NOTE — Assessment & Plan Note (Addendum)
Continue prolia - latest injection 12/27/2020.

## 2021-02-15 NOTE — Progress Notes (Signed)
Patient ID: Marilyn Cook, female    DOB: 07-Dec-1945, 76 y.o.   MRN: [id number]  This visit was conducted in person.  BP 126/82    Pulse 80    Temp 97.7 F (36.5 C) (Temporal)    Ht 5\' 1"  (1.549 m)    Wt 125 lb 8 oz (56.9 kg)    SpO2 100%    BMI 23.71 kg/m   BP Readings from Last 3 Encounters:  02/15/21 126/82  08/15/20 (!) 150/84  08/10/20 (!) 154/81   No data found.   CC: CPE/AMW - converted to acute visit as too early for physical.  Subjective:   HPI: Marilyn Cook is a 76 y.o. female presenting on 02/15/2021 for Follow-up (Here for 6 mo f/u.)   Overall doing well.  Lost husband Patrick Jupiter 02/2019. Son [name] lives next door.  Osteoporosis - last prolia injection 12/27/2020.  BP well controlled off medications.  Hypothyroidism stable on levothyroxine 72mcg daily.  She also continues plavix and atorvastatin daily.  Has stopped drinking ensure due to cost.      Relevant past medical, surgical, family and social history reviewed and updated as indicated. Interim medical history since our last visit reviewed. Allergies and medications reviewed and updated. Outpatient Medications Prior to Visit  Medication Sig Dispense Refill   Calcium Carb-Cholecalciferol (CALCIUM-VITAMIN D) 600-400 MG-UNIT TABS Take 1 tablet by mouth 2 (two) times daily.      cetirizine (ZYRTEC) 10 MG tablet Take 10 mg by mouth daily.     co-enzyme Q-10 30 MG capsule Take 30 mg by mouth daily.     FLUAD QUADRIVALENT 0.5 ML injection      hydrocortisone 2.5 % cream Apply 1 application topically 2 (two) times daily as needed (itching).     Misc Natural Products (BLACK CHERRY CONCENTRATE PO) Take 1 tablet by mouth daily.      Potassium Chloride (K-TABS PO) Take 1 tablet by mouth daily.     Pyridoxine HCl (VITAMIN B-6 PO) Take 1 tablet by mouth daily.     triamcinolone (KENALOG) 0.147 MG/GM topical spray Apply topically 2 (two) times daily as needed. For psoriasis 63 g 1   vitamin B-12 (CYANOCOBALAMIN) 500 MCG  tablet Take 1 tablet (500 mcg total) by mouth every Monday, Wednesday, and Friday.     atorvastatin (LIPITOR) 40 MG tablet Take 1 tablet (40 mg total) by mouth daily. 90 tablet 3   clopidogrel (PLAVIX) 75 MG tablet Take 1 tablet (75 mg total) by mouth daily. 90 tablet 3   levothyroxine (SYNTHROID) 25 MCG tablet Take 1 tablet (25 mcg total) by mouth daily before breakfast. 90 tablet 3   No facility-administered medications prior to visit.     Per HPI unless specifically indicated in ROS section below Review of Systems  Objective:  BP 126/82    Pulse 80    Temp 97.7 F (36.5 C) (Temporal)    Ht 5\' 1"  (1.549 m)    Wt 125 lb 8 oz (56.9 kg)    SpO2 100%    BMI 23.71 kg/m   Wt Readings from Last 3 Encounters:  02/15/21 125 lb 8 oz (56.9 kg)  08/15/20 128 lb 6 oz (58.2 kg)  08/10/20 129 lb 6.6 oz (58.7 kg)      Physical Exam Vitals and nursing note reviewed.  Constitutional:      Appearance: Normal appearance. She is not ill-appearing.  Cardiovascular:     Rate and Rhythm: Normal rate and regular  rhythm.     Pulses: Normal pulses.     Heart sounds: Normal heart sounds. No murmur heard. Pulmonary:     Effort: Pulmonary effort is normal. No respiratory distress.     Breath sounds: Normal breath sounds. No wheezing, rhonchi or rales.  Musculoskeletal:     Right lower leg: No edema.     Left lower leg: No edema.  Skin:    General: Skin is warm and dry.     Findings: No rash.  Neurological:     Mental Status: She is alert.  Psychiatric:        Mood and Affect: Mood normal.        Behavior: Behavior normal.      Results for orders placed or performed in visit on 19/62/22  Basic Metabolic Panel  Result Value Ref Range   Sodium 142 135 - 145 mEq/L   Potassium 3.6 3.5 - 5.1 mEq/L   Chloride 104 96 - 112 mEq/L   CO2 30 19 - 32 mEq/L   Glucose, Bld 114 (H) 70 - 99 mg/dL   BUN 7 6 - 23 mg/dL   Creatinine, Ser 0.91 0.40 - 1.20 mg/dL   GFR 61.40 >60.00 mL/min   Calcium 9.1 8.4 -  10.5 mg/dL    Assessment & Plan:  This visit occurred during the SARS-CoV-2 public health emergency.  Safety protocols were in place, including screening questions prior to the visit, additional usage of staff PPE, and extensive cleaning of exam room while observing appropriate contact time as indicated for disinfecting solutions.   Problem List Items Addressed This Visit     HLD (hyperlipidemia) - Primary    Update FLPL on atorvastatin 40mg  daily. The 10-year ASCVD risk score (Arnett DK, et al., 2019) is: 16.9%   Values used to calculate the score:     Age: 89 years     Sex: Female     Is Non-Hispanic African American: No     Diabetic: No     Tobacco smoker: No     Systolic Blood Pressure: 979 mmHg     Is BP treated: No     HDL Cholesterol: 50.3 mg/dL     Total Cholesterol: 139 mg/dL       Relevant Medications   atorvastatin (LIPITOR) 40 MG tablet   Other Relevant Orders   Comprehensive metabolic panel   Lipid panel   Osteoporosis    Continue prolia - latest injection 12/27/2020.       Relevant Orders   VITAMIN D 25 Hydroxy (Vit-D Deficiency, Fractures)   Borderline hypothyroidism    Continue levothyroxine 38mcg daily. Update TSH and fT4 today.       Relevant Medications   levothyroxine (SYNTHROID) 25 MCG tablet   Other Relevant Orders   TSH   CBC with Differential/Platelet   T4, free     Meds ordered this encounter  Medications   levothyroxine (SYNTHROID) 25 MCG tablet    Sig: Take 1 tablet (25 mcg total) by mouth daily before breakfast.    Dispense:  90 tablet    Refill:  3   atorvastatin (LIPITOR) 40 MG tablet    Sig: Take 1 tablet (40 mg total) by mouth daily.    Dispense:  90 tablet    Refill:  3   clopidogrel (PLAVIX) 75 MG tablet    Sig: Take 1 tablet (75 mg total) by mouth daily.    Dispense:  90 tablet    Refill:  3  Orders Placed This Encounter  Procedures   VITAMIN D 25 Hydroxy (Vit-D Deficiency, Fractures)   Comprehensive metabolic panel    Lipid panel   TSH   CBC with Differential/Platelet   T4, free     Patient Instructions  Good to see you today.  You have a phone call scheduled for wellness visit at 1pm on Monday Feb 13th.  Return in 1 month for physical.  Labs today  Follow up plan: Return in about 4 weeks (around 03/15/2021) for annual exam, prior fasting for blood work.  Ria Bush, MD

## 2021-02-16 LAB — CBC WITH DIFFERENTIAL/PLATELET
Absolute Monocytes: 563 cells/uL (ref 200–950)
Basophils Absolute: 38 cells/uL (ref 0–200)
Basophils Relative: 0.6 %
Eosinophils Absolute: 90 cells/uL (ref 15–500)
Eosinophils Relative: 1.4 %
HCT: 39.6 % (ref 35.0–45.0)
Hemoglobin: 13.8 g/dL (ref 11.7–15.5)
Lymphs Abs: 1715 cells/uL (ref 850–3900)
MCH: 33 pg (ref 27.0–33.0)
MCHC: 34.8 g/dL (ref 32.0–36.0)
MCV: 94.7 fL (ref 80.0–100.0)
MPV: 9.4 fL (ref 7.5–12.5)
Monocytes Relative: 8.8 %
Neutro Abs: 3994 cells/uL (ref 1500–7800)
Neutrophils Relative %: 62.4 %
Platelets: 247 10*3/uL (ref 140–400)
RBC: 4.18 10*6/uL (ref 3.80–5.10)
RDW: 12.7 % (ref 11.0–15.0)
Total Lymphocyte: 26.8 %
WBC: 6.4 10*3/uL (ref 3.8–10.8)

## 2021-02-16 LAB — COMPREHENSIVE METABOLIC PANEL
AG Ratio: 1.9 (calc) (ref 1.0–2.5)
ALT: 14 U/L (ref 6–29)
AST: 19 U/L (ref 10–35)
Albumin: 4.5 g/dL (ref 3.6–5.1)
Alkaline phosphatase (APISO): 50 U/L (ref 37–153)
BUN/Creatinine Ratio: 5 (calc) — ABNORMAL LOW (ref 6–22)
BUN: 4 mg/dL — ABNORMAL LOW (ref 7–25)
CO2: 27 mmol/L (ref 20–32)
Calcium: 8.7 mg/dL (ref 8.6–10.4)
Chloride: 104 mmol/L (ref 98–110)
Creat: 0.76 mg/dL (ref 0.60–1.00)
Globulin: 2.4 g/dL (calc) (ref 1.9–3.7)
Glucose, Bld: 91 mg/dL (ref 65–99)
Potassium: 3.2 mmol/L — ABNORMAL LOW (ref 3.5–5.3)
Sodium: 142 mmol/L (ref 135–146)
Total Bilirubin: 0.5 mg/dL (ref 0.2–1.2)
Total Protein: 6.9 g/dL (ref 6.1–8.1)

## 2021-02-16 LAB — LIPID PANEL
Cholesterol: 144 mg/dL (ref <200)
HDL: 50 mg/dL (ref >=50)
LDL Cholesterol (Calc): 76 mg/dL (calc)
Non-HDL Cholesterol (Calc): 94 mg/dL (calc) (ref <130)
Total CHOL/HDL Ratio: 2.9 (calc) (ref <5.0)
Triglycerides: 92 mg/dL (ref <150)

## 2021-02-16 LAB — TSH: TSH: 5.53 mIU/L — ABNORMAL HIGH (ref 0.40–4.50)

## 2021-02-16 LAB — VITAMIN D 25 HYDROXY (VIT D DEFICIENCY, FRACTURES): Vit D, 25-Hydroxy: 42 ng/mL (ref 30–100)

## 2021-02-16 LAB — T4, FREE: Free T4: 1.1 ng/dL (ref 0.8–1.8)

## 2021-02-18 ENCOUNTER — Ambulatory Visit: Payer: Medicare HMO

## 2021-05-08 ENCOUNTER — Ambulatory Visit (INDEPENDENT_AMBULATORY_CARE_PROVIDER_SITE_OTHER): Payer: Medicare HMO

## 2021-05-08 ENCOUNTER — Telehealth: Payer: Self-pay

## 2021-05-08 VITALS — Ht 61.0 in | Wt 125.0 lb

## 2021-05-08 DIAGNOSIS — H539 Unspecified visual disturbance: Secondary | ICD-10-CM

## 2021-05-08 DIAGNOSIS — Z0000 Encounter for general adult medical examination without abnormal findings: Secondary | ICD-10-CM

## 2021-05-08 NOTE — Patient Instructions (Signed)
Marilyn Cook , ?Thank you for taking time to come for your Medicare Wellness Visit. I appreciate your ongoing commitment to your health goals. Please review the following plan we discussed and let me know if I can assist you in the future.  ? ?Screening recommendations/referrals: ?Colonoscopy: Done 04/17/2015, no longer required. ? ?Mammogram: Done 06/29/2020 Repeat annually ? ?Bone Density: Done 03/08/2020 Repeat every 2 years ? ?Recommended yearly ophthalmology/optometry visit for glaucoma screening and checkup ?Recommended yearly dental visit for hygiene and checkup ? ?Vaccinations: ?Influenza vaccine: Due Fall 2023. ?Pneumococcal vaccine: Done 07/31/2011 and 01/23/2015 ?Tdap vaccine: Done 07/31/2011 Repeat in 10 years ? ?Shingles vaccine: Done 11/23/2015, 07/22/14/2019 and 11/07/2017   ?Covid-19:Done 02/12/2019 and 12/25/2019  ? ?Advanced directives: Copy in chart. ? ?Conditions/risks identified: Aim for 30 minutes of exercise or walking, 6-8 glasses of water, and 5 servings of fruits and vegetables each day. ? ? ?Next appointment: Follow up in one year for your annual wellness visit 05/12/2022 @ 11:15 AM. ? ? ?Preventive Care 64 Years and Older, Female ?Preventive care refers to lifestyle choices and visits with your health care provider that can promote health and wellness. ?What does preventive care include? ?A yearly physical exam. This is also called an annual well check. ?Dental exams once or twice a year. ?Routine eye exams. Ask your health care provider how often you should have your eyes checked. ?Personal lifestyle choices, including: ?Daily care of your teeth and gums. ?Regular physical activity. ?Eating a healthy diet. ?Avoiding tobacco and drug use. ?Limiting alcohol use. ?Practicing safe sex. ?Taking low-dose aspirin every day. ?Taking vitamin and mineral supplements as recommended by your health care provider. ?What happens during an annual well check? ?The services and screenings done by your health care  provider during your annual well check will depend on your age, overall health, lifestyle risk factors, and family history of disease. ?Counseling  ?Your health care provider may ask you questions about your: ?Alcohol use. ?Tobacco use. ?Drug use. ?Emotional well-being. ?Home and relationship well-being. ?Sexual activity. ?Eating habits. ?History of falls. ?Memory and ability to understand (cognition). ?Work and work Statistician. ?Reproductive health. ?Screening  ?You may have the following tests or measurements: ?Height, weight, and BMI. ?Blood pressure. ?Lipid and cholesterol levels. These may be checked every 5 years, or more frequently if you are over 50 years old. ?Skin check. ?Lung cancer screening. You may have this screening every year starting at age 6 if you have a 30-pack-year history of smoking and currently smoke or have quit within the past 15 years. ?Fecal occult blood test (FOBT) of the stool. You may have this test every year starting at age 84. ?Flexible sigmoidoscopy or colonoscopy. You may have a sigmoidoscopy every 5 years or a colonoscopy every 10 years starting at age 45. ?Hepatitis C blood test. ?Hepatitis B blood test. ?Sexually transmitted disease (STD) testing. ?Diabetes screening. This is done by checking your blood sugar (glucose) after you have not eaten for a while (fasting). You may have this done every 1-3 years. ?Bone density scan. This is done to screen for osteoporosis. You may have this done starting at age 79. ?Mammogram. This may be done every 1-2 years. Talk to your health care provider about how often you should have regular mammograms. ?Talk with your health care provider about your test results, treatment options, and if necessary, the need for more tests. ?Vaccines  ?Your health care provider may recommend certain vaccines, such as: ?Influenza vaccine. This is recommended every year. ?Tetanus,  diphtheria, and acellular pertussis (Tdap, Td) vaccine. You may need a Td  booster every 10 years. ?Zoster vaccine. You may need this after age 70. ?Pneumococcal 13-valent conjugate (PCV13) vaccine. One dose is recommended after age 32. ?Pneumococcal polysaccharide (PPSV23) vaccine. One dose is recommended after age 23. ?Talk to your health care provider about which screenings and vaccines you need and how often you need them. ?This information is not intended to replace advice given to you by your health care provider. Make sure you discuss any questions you have with your health care provider. ?Document Released: 01/19/2015 Document Revised: 09/12/2015 Document Reviewed: 10/24/2014 ?Elsevier Interactive Patient Education ? 2017 [employer]. ? ?Fall Prevention in the Home ?Falls can cause injuries. They can happen to people of all ages. There are many things you can do to make your home safe and to help prevent falls. ?What can I do on the outside of my home? ?Regularly fix the edges of walkways and driveways and fix any cracks. ?Remove anything that might make you trip as you walk through a door, such as a raised step or threshold. ?Trim any bushes or trees on the path to your home. ?Use bright outdoor lighting. ?Clear any walking paths of anything that might make someone trip, such as rocks or tools. ?Regularly check to see if handrails are loose or broken. Make sure that both sides of any steps have handrails. ?Any raised decks and porches should have guardrails on the edges. ?Have any leaves, snow, or ice cleared regularly. ?Use sand or salt on walking paths during winter. ?Clean up any spills in your garage right away. This includes oil or grease spills. ?What can I do in the bathroom? ?Use night lights. ?Install grab bars by the toilet and in the tub and shower. Do not use towel bars as grab bars. ?Use non-skid mats or decals in the tub or shower. ?If you need to sit down in the shower, use a plastic, non-slip stool. ?Keep the floor dry. Clean up any water that spills on the floor  as soon as it happens. ?Remove soap buildup in the tub or shower regularly. ?Attach bath mats securely with double-sided non-slip rug tape. ?Do not have throw rugs and other things on the floor that can make you trip. ?What can I do in the bedroom? ?Use night lights. ?Make sure that you have a light by your bed that is easy to reach. ?Do not use any sheets or blankets that are too big for your bed. They should not hang down onto the floor. ?Have a firm chair that has side arms. You can use this for support while you get dressed. ?Do not have throw rugs and other things on the floor that can make you trip. ?What can I do in the kitchen? ?Clean up any spills right away. ?Avoid walking on wet floors. ?Keep items that you use a lot in easy-to-reach places. ?If you need to reach something above you, use a strong step stool that has a grab bar. ?Keep electrical cords out of the way. ?Do not use floor polish or wax that makes floors slippery. If you must use wax, use non-skid floor wax. ?Do not have throw rugs and other things on the floor that can make you trip. ?What can I do with my stairs? ?Do not leave any items on the stairs. ?Make sure that there are handrails on both sides of the stairs and use them. Fix handrails that are broken or loose. Make  sure that handrails are as long as the stairways. ?Check any carpeting to make sure that it is firmly attached to the stairs. Fix any carpet that is loose or worn. ?Avoid having throw rugs at the top or bottom of the stairs. If you do have throw rugs, attach them to the floor with carpet tape. ?Make sure that you have a light switch at the top of the stairs and the bottom of the stairs. If you do not have them, ask someone to add them for you. ?What else can I do to help prevent falls? ?Wear shoes that: ?Do not have high heels. ?Have rubber bottoms. ?Are comfortable and fit you well. ?Are closed at the toe. Do not wear sandals. ?If you use a stepladder: ?Make sure that it is  fully opened. Do not climb a closed stepladder. ?Make sure that both sides of the stepladder are locked into place. ?Ask someone to hold it for you, if possible. ?Clearly mark and make sure that you can see: ?

## 2021-05-08 NOTE — Addendum Note (Signed)
Addended by: Randal Buba K on: 05/08/2021 02:42 PM ? ? Modules accepted: Orders ? ?

## 2021-05-08 NOTE — Telephone Encounter (Signed)
Pt asks if taking [facility] would be beneficial to help with her memory and would it be safe with her medications? I advised patient that I would like to consult with you before giving her that advise. Pt verbalized understanding. Thank you.  ?

## 2021-05-08 NOTE — Progress Notes (Signed)
? ?Subjective:  ? Marilyn Cook is a 76 y.o. female who presents for Medicare Annual (Subsequent) preventive examination. ?Virtual Visit via Telephone Note ? ?I connected with  Marilyn Cook on 05/08/21 at 11:15 AM EDT by telephone and verified that I am speaking with the correct person using two identifiers. ? ?Location: ?Patient: HOME ?Provider: [facility] ?Persons participating in the virtual visit: patient/Nurse Health Advisor ?  ?I discussed the limitations, risks, security and privacy concerns of performing an evaluation and management service by telephone and the availability of in person appointments. The patient expressed understanding and agreed to proceed. ? ?Interactive audio and video telecommunications were attempted between this nurse and patient, however failed, due to patient having technical difficulties OR patient did not have access to video capability.  We continued and completed visit with audio only. ? ?Some vital signs may be absent or patient reported.  ? ?Marilyn Driver, LPN ? ?Review of Systems    ? ?Cardiac Risk Factors include: advanced age (>41mn, >>70women);dyslipidemia;sedentary lifestyle ? ?   ?Objective:  ?  ?Today's Vitals  ? 05/08/21 1118  ?Weight: 125 lb (56.7 kg)  ?Height: '5\' 1"'$  (1.549 m)  ? ?Body mass index is 23.62 kg/m?. ? ? ?  05/08/2021  ? 11:32 AM 08/10/2020  ?  5:58 PM 02/17/2020  ?  1:33 PM 02/14/2019  ?  2:55 PM 11/14/2014  ? 12:37 PM 10/30/2014  ?  7:25 AM 10/23/2014  ?  4:05 PM  ?Advanced Directives  ?Does Patient Have a Medical Advance Directive? Yes Yes Yes Yes No No Yes  ?Type of AParamedicof [place] will Healthcare Power of [place] will [place]Living will   [place] will  ?Copy of HGreggin Chart? Yes - validated most recent copy scanned in chart (See row information)  Yes - validated most recent copy scanned in chart (See row  information) Yes - validated most recent copy scanned in chart (See row information)     ?Would patient like information on creating a medical advance directive?     Yes - Educational materials given    ? ? ?Current Medications (verified) ?Outpatient Encounter Medications as of 05/08/2021  ?Medication Sig  ? atorvastatin (LIPITOR) 40 MG tablet Take 1 tablet (40 mg total) by mouth daily.  ? Calcium Carb-Cholecalciferol (CALCIUM-VITAMIN D) 600-400 MG-UNIT TABS Take 1 tablet by mouth 2 (two) times daily.   ? cetirizine (ZYRTEC) 10 MG tablet Take 10 mg by mouth daily.  ? clopidogrel (PLAVIX) 75 MG tablet Take 1 tablet (75 mg total) by mouth daily.  ? co-enzyme Q-10 30 MG capsule Take 30 mg by mouth daily.  ? FLUAD QUADRIVALENT 0.5 ML injection   ? hydrocortisone 2.5 % cream Apply 1 application topically 2 (two) times daily as needed (itching).  ? levothyroxine (SYNTHROID) 25 MCG tablet Take 1 tablet (25 mcg total) by mouth daily before breakfast.  ? Misc Natural Products (BLACK CHERRY CONCENTRATE PO) Take 1 tablet by mouth daily.   ? Potassium Chloride (K-TABS PO) Take 1 tablet by mouth daily.  ? Pyridoxine HCl (VITAMIN B-6 PO) Take 1 tablet by mouth daily.  ? triamcinolone (KENALOG) 0.147 MG/GM topical spray Apply topically 2 (two) times daily as needed. For psoriasis  ? vitamin B-12 (CYANOCOBALAMIN) 500 MCG tablet Take 1 tablet (500 mcg total) by mouth every Monday, Wednesday, and Friday.  ? ?No facility-administered encounter medications on file  as of 05/08/2021.  ? ? ?Allergies (verified) ?Aspirin, Diphenhydramine hcl, Donepezil, Eggs or egg-derived products, Epinephrine, Latex, Other, Penicillins, and Sulfasalazine  ? ?History: ?Past Medical History:  ?Diagnosis Date  ? Benign tumor of breast 1975  ? Complication of anesthesia   ? hard to wake up  ? Hyperlipemia   ? NSVD (normal spontaneous vaginal delivery)   ? x 1  ? Osteopenia 08/2012  ? spine WNL, hip T -1.2  ? Scalp psoriasis   ? Stroke [facility])   ? possible TIA in  06/2014  ? TIA (transient ischemic attack) 06/08/2014  ? ?Past Surgical History:  ?Procedure Laterality Date  ? ABDOMINAL HYSTERECTOMY    ? COLONOSCOPY  04/2015  ? diverticulosis, int hem, rpt 10 yrs (Nandigam)  ? DEXA  08/2012  ? spine WNL, hip T -1.2  ? PARTIAL HYSTERECTOMY  1987  ? due to fibroids, ovaries remain  ? R Lat Epicondyle Injections  1998  ? SHOULDER ARTHROSCOPY Right 11/2014  ? arthroscopic debridement and mini open rotator cuff repair of R shoulder for chronic impingement and rotator cuff tear by MRI [facility])  ? Spiral CT  04/02  ? (-)  ? ?Family History  ?Problem Relation Age of Onset  ? Heart disease Father   ?     MI   ? Stroke Father   ?     CVA  ? Stroke Mother   ?     CVA  ? Hypertension Sister   ? Cancer Sister   ?     ovarian and colon cancer  ? Aneurysm Brother   ? Hypertension Brother   ? Hypertension Brother   ? Cancer Paternal Aunt   ?     ? colon  ? Cancer Paternal Grandfather   ?     liver, alcohol  ? Alcohol abuse Paternal Grandfather   ? Cancer Other   ?     prostate  ? Cancer Maternal Aunt   ?     breast  ? ?Social History  ? ?Socioeconomic History  ? Marital status: Married  ?  Spouse name: Not on file  ? Number of children: 1  ? Years of education: Not on file  ? Highest education level: Not on file  ?Occupational History  ? Occupation: HR  ?  Employer: [employer]  ?  Comment: new hires  ?Tobacco Use  ? Smoking status: Never  ? Smokeless tobacco: Never  ?Substance and Sexual Activity  ? Alcohol use: No  ?  Alcohol/week: 0.0 standard drinks  ? Drug use: No  ? Sexual activity: Yes  ?Other Topics Concern  ? Not on file  ?Social History Narrative  ? Widow - husband Marilyn Cook died from cardiac arrest 2019/03/01  ? One son  ? Occupation: retired from working at [employer] at [employer]  ? Activity: no regular exercise  ? Diet: some water, fruits/vegetables daily  ?   ? Advanced directives: discussed - HCPOA unsure will consider this. Needs to set up.  ? ?Social Determinants of Health  ? ?Financial Resource  Strain: Low Risk   ? Difficulty of Paying Living Expenses: Not hard at all  ?Food Insecurity: No Food Insecurity  ? Worried About Charity fundraiser in the Last Year: Never true  ? Ran Out of Food in the Last Year: Never true  ?Transportation Needs: No Transportation Needs  ? Lack of Transportation (Medical): No  ? Lack of Transportation (Non-Medical): No  ?Physical Activity: Inactive  ?  Days of Exercise per Week: 0 days  ? Minutes of Exercise per Session: 0 min  ?Stress: No Stress Concern Present  ? Feeling of Stress : Only a little  ?Social Connections: Moderately Integrated  ? Frequency of Communication with Friends and Family: More than three times a week  ? Frequency of Social Gatherings with Friends and Family: More than three times a week  ? Attends Religious Services: More than 4 times per year  ? Active Member of Clubs or Organizations: Yes  ? Attends Archivist Meetings: More than 4 times per year  ? Marital Status: Widowed  ? ? ?Tobacco Counseling ?Counseling given: Not Answered ? ? ?Clinical Intake: ? ?Pre-visit preparation completed: Yes ? ?Pain : No/denies pain ? ?  ? ?BMI - recorded: 23.62 ?Nutritional Status: BMI of 19-24  Normal ?Nutritional Risks: None ?Diabetes: No ? ?How often do you need to have someone help you when you read instructions, pamphlets, or other written materials from your doctor or pharmacy?: 1 - Never ? ?Diabetic?no ? ?Interpreter Needed?: No ? ?Information entered by :: mj Foch Rosenwald, lpn ? ? ?Activities of Daily Living ? ?  05/08/2021  ? 11:36 AM  ?In your present state of health, do you have any difficulty performing the following activities:  ?Hearing? 0  ?Vision? 0  ?Difficulty concentrating or making decisions? 1  ?Comment memory at times.  ?Walking or climbing stairs? 0  ?Dressing or bathing? 0  ?Doing errands, shopping? 0  ?Preparing Food and eating ? N  ?Using the Toilet? N  ?In the past six months, have you accidently leaked urine? N  ?Do you have problems with  loss of bowel control? N  ?Managing your Medications? N  ?Managing your Finances? N  ?Housekeeping or managing your Housekeeping? N  ? ? ?Patient Care Team: ?Ria Bush, MD as PCP - General (Family M

## 2021-05-09 NOTE — Telephone Encounter (Signed)
Don't recommend it as it can slow blood clotting and increase bleeding risk and she's already on plavix.  ?

## 2021-05-10 NOTE — Telephone Encounter (Signed)
Attempted to contact pt.  No vm.  Need to relay Dr. Synthia Innocent message concerning taking Lion's mane.  ?

## 2021-05-13 ENCOUNTER — Other Ambulatory Visit: Payer: Self-pay | Admitting: Family Medicine

## 2021-05-13 DIAGNOSIS — E785 Hyperlipidemia, unspecified: Secondary | ICD-10-CM

## 2021-05-13 DIAGNOSIS — N289 Disorder of kidney and ureter, unspecified: Secondary | ICD-10-CM

## 2021-05-13 DIAGNOSIS — E039 Hypothyroidism, unspecified: Secondary | ICD-10-CM

## 2021-05-13 DIAGNOSIS — M810 Age-related osteoporosis without current pathological fracture: Secondary | ICD-10-CM

## 2021-05-13 NOTE — Telephone Encounter (Signed)
Attempted to contact pt.  No vm.  Need to relay Dr. Synthia Innocent message concerning taking Lion's mane.  ?

## 2021-05-14 ENCOUNTER — Other Ambulatory Visit: Payer: Medicare HMO

## 2021-05-14 NOTE — Telephone Encounter (Signed)
Patient notified as instructed by telephone and verbalized understanding. 

## 2021-05-21 ENCOUNTER — Ambulatory Visit (INDEPENDENT_AMBULATORY_CARE_PROVIDER_SITE_OTHER): Payer: Medicare HMO | Admitting: Family Medicine

## 2021-05-21 ENCOUNTER — Encounter: Payer: Self-pay | Admitting: Family Medicine

## 2021-05-21 VITALS — BP 150/82 | HR 96 | Temp 97.9°F | Ht 59.75 in | Wt 122.1 lb

## 2021-05-21 DIAGNOSIS — H93A1 Pulsatile tinnitus, right ear: Secondary | ICD-10-CM | POA: Diagnosis not present

## 2021-05-21 DIAGNOSIS — G3184 Mild cognitive impairment, so stated: Secondary | ICD-10-CM

## 2021-05-21 DIAGNOSIS — S22070D Wedge compression fracture of T9-T10 vertebra, subsequent encounter for fracture with routine healing: Secondary | ICD-10-CM

## 2021-05-21 DIAGNOSIS — Z7189 Other specified counseling: Secondary | ICD-10-CM

## 2021-05-21 DIAGNOSIS — Z0000 Encounter for general adult medical examination without abnormal findings: Secondary | ICD-10-CM

## 2021-05-21 DIAGNOSIS — M810 Age-related osteoporosis without current pathological fracture: Secondary | ICD-10-CM | POA: Diagnosis not present

## 2021-05-21 DIAGNOSIS — N289 Disorder of kidney and ureter, unspecified: Secondary | ICD-10-CM

## 2021-05-21 DIAGNOSIS — R634 Abnormal weight loss: Secondary | ICD-10-CM

## 2021-05-21 DIAGNOSIS — G459 Transient cerebral ischemic attack, unspecified: Secondary | ICD-10-CM

## 2021-05-21 DIAGNOSIS — I700 Atherosclerosis of aorta: Secondary | ICD-10-CM | POA: Diagnosis not present

## 2021-05-21 DIAGNOSIS — E785 Hyperlipidemia, unspecified: Secondary | ICD-10-CM

## 2021-05-21 DIAGNOSIS — E039 Hypothyroidism, unspecified: Secondary | ICD-10-CM

## 2021-05-21 HISTORY — DX: Pulsatile tinnitus, right ear: H93.A1

## 2021-05-21 NOTE — Assessment & Plan Note (Signed)
Continue Plavix, statin.  

## 2021-05-21 NOTE — Assessment & Plan Note (Signed)
Preventative protocols reviewed and updated unless pt declined. Discussed healthy diet and lifestyle.  

## 2021-05-21 NOTE — Assessment & Plan Note (Signed)
Advanced directives: discussed - thinks HCPOA would be son. Thinks she filled this out. Asked to bring in to update chart. ?

## 2021-05-21 NOTE — Assessment & Plan Note (Addendum)
Overall reassuring exam today without significant bruits heard. Anticipate related to arterial bruit in known ATH hx.  ?I did ask her to let me know if ongoing pulsatile tinnitus of right ear to order carotid US and refer her to ENT for further evaluation. ?

## 2021-05-21 NOTE — Assessment & Plan Note (Signed)
T10 vertebral compression fracture incidentally noted on chest x-ray from last year.  See above ?

## 2021-05-21 NOTE — Assessment & Plan Note (Addendum)
Chronic, stable on atorvastatin '40mg'$  daily - continue. ?As she is not fasting today, I asked her to return for labs later this week. ?The 10-year ASCVD risk score (Arnett DK, et al., 2019) is: 23% ?  Values used to calculate the score: ?    Age: 76 years ?    Sex: Female ?    Is Non-Hispanic African American: No ?    Diabetic: No ?    Tobacco smoker: No ?    Systolic Blood Pressure: 882 mmHg ?    Is BP treated: No ?    HDL Cholesterol: 50 mg/dL ?    Total Cholesterol: 144 mg/dL  ?

## 2021-05-21 NOTE — Progress Notes (Addendum)
? ? Patient ID: Marilyn Cook, female    DOB: [birth year], 76 y.o.   MRN: [id number] ? ?This visit was conducted in person. ? ?BP (!) 150/82 (BP Location: Right Arm, Cuff Size: Normal)   Pulse 96   Temp 97.9 ?F (36.6 ?C) (Temporal)   Ht 4' 11.75" (1.518 m)   Wt 122 lb 2 oz (55.4 kg)   SpO2 99%   BMI 24.05 kg/m?   ? ?CC: CPE ?Subjective:  ? ?HPI: ?Marilyn Cook is a 76 y.o. female presenting on 05/21/2021 for Annual Exam [facility] prt 2. ) ? ? ?Saw health advisor 2 wks ago for medicare wellness visit. Note reviewed.   ? ?No results found.  ?Flowsheet Row Clinical Support from 05/08/2021 in [facility]  ?PHQ-2 Total Score 1  ? ?  ?  ? ?  05/08/2021  ? 11:33 AM 02/15/2021  ?  2:39 PM 02/17/2020  ?  1:33 PM 02/14/2019  ?  2:55 PM 02/05/2018  ?  3:16 PM  ?Fall Risk   ?Falls in the past year? 1 0 1 1 0  ?Comment    tripped and fell   ?Number falls in past yr: 0  0 1   ?Injury with Fall? 0  0 0   ?Risk for fall due to : History of fall(s)  No Fall Risks Medication side effect   ?Risk for fall due to: Comment pt states she fell out of bed.      ?Follow up Falls prevention discussed  Falls evaluation completed;Falls prevention discussed Falls evaluation completed;Falls prevention discussed   ? ?Lost husband Patrick Jupiter Feb 24, 2019. Son [name] lives next door.  ? ?She lives alone. 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 ?Notes R pulsatile tinnitus when laying in bed at night new over the past 6 months. This has happened every night for the last 3 nights.  ?   ?Preventative: ?COLONOSCOPY 04/2015 diverticulosis, int hem, rpt 10 yrs (Nandigam) ?Pap smear - Partial hysterectomy for benign reason (fibroids), ovaries remain. Bimanual only done 2015. Denies pelvic pain or pressure.  ?Mammogram 06/2020 - Birads1 @ [facility].  ?DEXA 03/2020 - spine -1.0, R hip neck -1.9 - however dx osteoporosis given h/o vertebral compression fracture on previous CXR, rec rpt 1 yr. Started prolia 06/2020, last  injection 12/27/2020.  ?Flubloc yearly (egg allergy)  ?Covid vaccine Pfizer 01/2019, 02/24/2019, no booster ?Pneumovax 07/31/2011, prevnar-13 01/2015  ?Tdap 07/2011  ?zostavax - 11/2015  ?shingrix - 07/2017, 11/2017  ?Advanced directives: discussed - thinks HCPOA would be son. Thinks she filled this out. Asked to bring in to update chart. ?Seat belt use discussed ?Sunscreen use discussed - allergic to sunscreen. No changing moles on skin. Sees dermatologist yearly.  ?Non smoker ?Alcohol - none ?Dentist - due  ?Eye exam yearly - at [facility]  ?Bowel - no constipation  ?Bladder - no incontinence  ?  ?Widow - husband Patrick Jupiter died from cardiac arrest 02/24/2019 ?One son [name]) ?Occupation: worked at [employer] at [employer] - retired 03/2017 ?Activity: no regular exercise, active through rehab ?Diet: some water, fruits/vegetables daily ?   ? ?Relevant past medical, surgical, family and social history reviewed and updated as indicated. Interim medical history since our last visit reviewed. ?Allergies and medications reviewed and updated. ?Outpatient Medications Prior to Visit  ?Medication Sig Dispense Refill  ? atorvastatin (LIPITOR) 40 MG tablet Take 1 tablet (40 mg total) by mouth daily. 90 tablet 3  ?  Calcium Carb-Cholecalciferol (CALCIUM-VITAMIN D) 600-400 MG-UNIT TABS Take 1 tablet by mouth 2 (two) times daily.     ? cetirizine (ZYRTEC) 10 MG tablet Take 10 mg by mouth daily.    ? clopidogrel (PLAVIX) 75 MG tablet Take 1 tablet (75 mg total) by mouth daily. 90 tablet 3  ? co-enzyme Q-10 30 MG capsule Take 30 mg by mouth daily.    ? hydrocortisone 2.5 % cream Apply 1 application topically 2 (two) times daily as needed (itching).    ? levothyroxine (SYNTHROID) 25 MCG tablet Take 1 tablet (25 mcg total) by mouth daily before breakfast. 90 tablet 3  ? Potassium Chloride (K-TABS PO) Take 1 tablet by mouth daily.    ? Pyridoxine HCl (VITAMIN B-6 PO) Take 1 tablet by mouth daily.    ? triamcinolone (KENALOG) 0.147 MG/GM topical spray Apply  topically 2 (two) times daily as needed. For psoriasis 63 g 1  ? vitamin B-12 (CYANOCOBALAMIN) 500 MCG tablet Take 1 tablet (500 mcg total) by mouth every Monday, Wednesday, and Friday.    ? FLUAD QUADRIVALENT 0.5 ML injection     ? Misc Natural Products (BLACK CHERRY CONCENTRATE PO) Take 1 tablet by mouth daily.     ? ?No facility-administered medications prior to visit.  ?  ? ?Per HPI unless specifically indicated in ROS section below ?Review of Systems  ?Constitutional:  Negative for activity change, appetite change, chills, fatigue, fever and unexpected weight change.  ?HENT:  Negative for hearing loss.   ?Eyes:  Negative for visual disturbance.  ?Respiratory:  Negative for cough, chest tightness, shortness of breath and wheezing.   ?Cardiovascular:  Negative for chest pain, palpitations and leg swelling.  ?Gastrointestinal:  Negative for abdominal distention, abdominal pain, blood in stool, constipation, diarrhea, nausea and vomiting.  ?Genitourinary:  Negative for difficulty urinating and hematuria.  ?Musculoskeletal:  Negative for arthralgias, myalgias and neck pain.  ?Skin:  Negative for rash.  ?Neurological:  Negative for dizziness, seizures, syncope and headaches.  ?Hematological:  Negative for adenopathy. Does not bruise/bleed easily.  ?Psychiatric/Behavioral:  Negative for dysphoric mood. The patient is not nervous/anxious.   ? ?Objective:  ?BP (!) 150/82 (BP Location: Right Arm, Cuff Size: Normal)   Pulse 96   Temp 97.9 ?F (36.6 ?C) (Temporal)   Ht 4' 11.75" (1.518 m)   Wt 122 lb 2 oz (55.4 kg)   SpO2 99%   BMI 24.05 kg/m?   ?Wt Readings from Last 3 Encounters:  ?05/21/21 122 lb 2 oz (55.4 kg)  ?05/08/21 125 lb (56.7 kg)  ?02/15/21 125 lb 8 oz (56.9 kg)  ?  ?  ?Physical Exam ?Vitals and nursing note reviewed.  ?Constitutional:   ?   Appearance: Normal appearance. She is not ill-appearing.  ?HENT:  ?   Head: Normocephalic and atraumatic.  ?   Right Ear: Tympanic membrane, ear canal and external ear  normal. There is no impacted cerumen.  ?   Left Ear: Tympanic membrane, ear canal and external ear normal. There is no impacted cerumen.  ?Eyes:  ?   General:     ?   Right eye: No discharge.     ?   Left eye: No discharge.  ?   Extraocular Movements: Extraocular movements intact.  ?   Conjunctiva/sclera: Conjunctivae normal.  ?   Pupils: Pupils are equal, round, and reactive to light.  ?Neck:  ?   Thyroid: No thyroid mass or thyromegaly.  ?   Vascular: No carotid bruit.  ?  Cardiovascular:  ?   Rate and Rhythm: Normal rate and regular rhythm.  ?   Pulses: Normal pulses.  ?   Heart sounds: Normal heart sounds. No murmur heard. ?Pulmonary:  ?   Effort: Pulmonary effort is normal. No respiratory distress.  ?   Breath sounds: Normal breath sounds. No wheezing, rhonchi or rales.  ?Abdominal:  ?   General: Bowel sounds are normal. There is no distension.  ?   Palpations: Abdomen is soft. There is no mass.  ?   Tenderness: There is no abdominal tenderness. There is no guarding or rebound.  ?   Hernia: No hernia is present.  ?Musculoskeletal:  ?   Cervical back: Normal range of motion and neck supple. No rigidity.  ?   Right lower leg: No edema.  ?   Left lower leg: No edema.  ?Lymphadenopathy:  ?   Cervical: No cervical adenopathy.  ?Skin: ?   General: Skin is warm and dry.  ?   Findings: No rash.  ?Neurological:  ?   General: No focal deficit present.  ?   Mental Status: She is alert. Mental status is at baseline.  ?Psychiatric:     ?   Mood and Affect: Mood normal.     ?   Behavior: Behavior normal.  ? ?   ?Results for orders placed or performed in visit on 02/15/21  ?CBC with Differential/Platelet  ?Result Value Ref Range  ? WBC 6.4 3.8 - 10.8 Thousand/uL  ? RBC 4.18 3.80 - 5.10 Million/uL  ? Hemoglobin 13.8 11.7 - 15.5 g/dL  ? HCT 39.6 35.0 - 45.0 %  ? MCV 94.7 80.0 - 100.0 fL  ? MCH 33.0 27.0 - 33.0 pg  ? MCHC 34.8 32.0 - 36.0 g/dL  ? RDW 12.7 11.0 - 15.0 %  ? Platelets 247 140 - 400 Thousand/uL  ? MPV 9.4 7.5 - 12.5  fL  ? Neutro Abs 3,994 1,500 - 7,800 cells/uL  ? Lymphs Abs 1,715 850 - 3,900 cells/uL  ? Absolute Monocytes 563 200 - 950 cells/uL  ? Eosinophils Absolute 90 15 - 500 cells/uL  ? Basophils Absolute 38 0 - 200 cells/uL

## 2021-05-21 NOTE — Assessment & Plan Note (Addendum)
Prolia started 06/2020, latest injection 12/2020.  ?Tolerating well - will continue. ?OP diagnosis based on vertebral compression fracture on prior imaging.  ?

## 2021-05-21 NOTE — Assessment & Plan Note (Signed)
Chronic, stable on low-dose levothyroxine replacement. ?As she is not fasting today I asked her to return for labs later this week. ?

## 2021-05-21 NOTE — Patient Instructions (Addendum)
Return at your convenience for fasting labs.  ?Call to schedule mammogram at your convenience: [facility] in [place] [phone] ?Bring me copy of your advanced  directive to update your chart. ?You will be due for next prolia next month.  ?If ongoing daily pulsating sound in the right ear, let me know for evaluation by ENT.  ?Good to see you today.  ?Return as needed or in 4-6 months for follow up visit.  ? ?Health Maintenance After Age 77 ?After age 39, you are at a higher risk for certain long-term diseases and infections as well as injuries from falls. Falls are a major cause of broken bones and head injuries in people who are older than age 53. Getting regular preventive care can help to keep you healthy and well. Preventive care includes getting regular testing and making lifestyle changes as recommended by your health care provider. Talk with your health care provider about: ?Which screenings and tests you should have. A screening is a test that checks for a disease when you have no symptoms. ?A diet and exercise plan that is right for you. ?What should I know about screenings and tests to prevent falls? ?Screening and testing are the best ways to find a health problem early. Early diagnosis and treatment give you the best chance of managing medical conditions that are common after age 18. Certain conditions and lifestyle choices may make you more likely to have a fall. Your health care provider may recommend: ?Regular vision checks. Poor vision and conditions such as cataracts can make you more likely to have a fall. If you wear glasses, make sure to get your prescription updated if your vision changes. ?Medicine review. Work with your health care provider to regularly review all of the medicines you are taking, including over-the-counter medicines. Ask your health care provider about any side effects that may make you more likely to have a fall. Tell your health care provider if any medicines  that you take make you feel dizzy or sleepy. ?Strength and balance checks. Your health care provider may recommend certain tests to check your strength and balance while standing, walking, or changing positions. ?Foot health exam. Foot pain and numbness, as well as not wearing proper footwear, can make you more likely to have a fall. ?Screenings, including: ?Osteoporosis screening. Osteoporosis is a condition that causes the bones to get weaker and break more easily. ?Blood pressure screening. Blood pressure changes and medicines to control blood pressure can make you feel dizzy. ?Depression screening. You may be more likely to have a fall if you have a fear of falling, feel depressed, or feel unable to do activities that you used to do. ?Alcohol use screening. Using too much alcohol can affect your balance and may make you more likely to have a fall. ?Follow these instructions at home: ?Lifestyle ?Do not drink alcohol if: ?Your health care provider tells you not to drink. ?If you drink alcohol: ?Limit how much you have to: ?0-1 drink a day for women. ?0-2 drinks a day for men. ?Know how much alcohol is in your drink. In the [place] one drink equals one 12 oz bottle of beer (355 mL), one 5 oz glass of wine (148 mL), or one 1? oz glass of hard liquor (44 mL). ?Do not use any products that contain nicotine or tobacco. These products include cigarettes, chewing tobacco, and vaping devices, such as e-cigarettes. If you need help quitting, ask your health care provider. ?Activity ? ?Follow a  regular exercise program to stay fit. This will help you maintain your balance. Ask your health care provider what types of exercise are appropriate for you. ?If you need a cane or walker, use it as recommended by your health care provider. ?Wear supportive shoes that have nonskid soles. ?Safety ? ?Remove any tripping hazards, such as rugs, cords, and clutter. ?Install safety equipment such as grab bars in bathrooms and safety rails on  stairs. ?Keep rooms and walkways well-lit. ?General instructions ?Talk with your health care provider about your risks for falling. Tell your health care provider if: ?You fall. Be sure to tell your health care provider about all falls, even ones that seem minor. ?You feel dizzy, tiredness (fatigue), or off-balance. ?Take over-the-counter and prescription medicines only as told by your health care provider. These include supplements. ?Eat a healthy diet and maintain a healthy weight. A healthy diet includes low-fat dairy products, low-fat (lean) meats, and fiber from whole grains, beans, and lots of fruits and vegetables. ?Stay current with your vaccines. ?Schedule regular health, dental, and eye exams. ?Summary ?Having a healthy lifestyle and getting preventive care can help to protect your health and wellness after age 10. ?Screening and testing are the best way to find a health problem early and help you avoid having a fall. Early diagnosis and treatment give you the best chance for managing medical conditions that are more common for people who are older than age 40. ?Falls are a major cause of broken bones and head injuries in people who are older than age 97. Take precautions to prevent a fall at home. ?Work with your health care provider to learn what changes you can make to improve your health and wellness and to prevent falls. ?This information is not intended to replace advice given to you by your health care provider. Make sure you discuss any questions you have with your health care provider. ?Document Revised: 05/14/2020 Document Reviewed: 05/14/2020 ?Elsevier Patient Education ? [place]. ? ?

## 2021-05-21 NOTE — Assessment & Plan Note (Signed)
Question TIA versus ophthalmic migraine history. ?She continues Plavix and statin. ?

## 2021-05-21 NOTE — Assessment & Plan Note (Signed)
Update creatinine when she returns fasting. ?

## 2021-05-21 NOTE — Assessment & Plan Note (Signed)
Overall stable period.  Lives alone, her son is her neighbor.  Consider updated MMSE next visit. ?

## 2021-05-21 NOTE — Assessment & Plan Note (Addendum)
Weight overall stable today at 122 pounds.  She was previously drinking Ensure protein supplements but stopped this due to increased cost. ?

## 2021-05-23 ENCOUNTER — Other Ambulatory Visit: Payer: Medicare HMO

## 2021-05-24 ENCOUNTER — Other Ambulatory Visit (INDEPENDENT_AMBULATORY_CARE_PROVIDER_SITE_OTHER): Payer: Medicare HMO

## 2021-05-24 DIAGNOSIS — M810 Age-related osteoporosis without current pathological fracture: Secondary | ICD-10-CM

## 2021-05-24 DIAGNOSIS — E039 Hypothyroidism, unspecified: Secondary | ICD-10-CM

## 2021-05-24 DIAGNOSIS — N289 Disorder of kidney and ureter, unspecified: Secondary | ICD-10-CM | POA: Diagnosis not present

## 2021-05-24 DIAGNOSIS — E785 Hyperlipidemia, unspecified: Secondary | ICD-10-CM

## 2021-05-24 LAB — COMPREHENSIVE METABOLIC PANEL
ALT: 17 U/L (ref 0–35)
AST: 20 U/L (ref 0–37)
Albumin: 4.1 g/dL (ref 3.5–5.2)
Alkaline Phosphatase: 48 U/L (ref 39–117)
BUN: 7 mg/dL (ref 6–23)
CO2: 28 mEq/L (ref 19–32)
Calcium: 8.3 mg/dL — ABNORMAL LOW (ref 8.4–10.5)
Chloride: 102 mEq/L (ref 96–112)
Creatinine, Ser: 0.8 mg/dL (ref 0.40–1.20)
GFR: 71.45 mL/min (ref >60.00)
Glucose, Bld: 159 mg/dL — ABNORMAL HIGH (ref 70–99)
Potassium: 3.4 mEq/L — ABNORMAL LOW (ref 3.5–5.1)
Sodium: 140 mEq/L (ref 135–145)
Total Bilirubin: 0.6 mg/dL (ref 0.2–1.2)
Total Protein: 6.6 g/dL (ref 6.0–8.3)

## 2021-05-24 LAB — CBC WITH DIFFERENTIAL/PLATELET
Basophils Absolute: 0 10*3/uL (ref 0.0–0.1)
Basophils Relative: 0.6 % (ref 0.0–3.0)
Eosinophils Absolute: 0.1 10*3/uL (ref 0.0–0.7)
Eosinophils Relative: 2 % (ref 0.0–5.0)
HCT: 39.6 % (ref 36.0–46.0)
Hemoglobin: 13.8 g/dL (ref 12.0–15.0)
Lymphocytes Relative: 25.2 % (ref 12.0–46.0)
Lymphs Abs: 1 10*3/uL (ref 0.7–4.0)
MCHC: 34.9 g/dL (ref 30.0–36.0)
MCV: 94.8 fl (ref 78.0–100.0)
Monocytes Absolute: 0.4 10*3/uL (ref 0.1–1.0)
Monocytes Relative: 10 % (ref 3.0–12.0)
Neutro Abs: 2.5 10*3/uL (ref 1.4–7.7)
Neutrophils Relative %: 62.2 % (ref 43.0–77.0)
Platelets: 219 10*3/uL (ref 150.0–400.0)
RBC: 4.18 Mil/uL (ref 3.87–5.11)
RDW: 12.9 % (ref 11.5–15.5)
WBC: 4 10*3/uL (ref 4.0–10.5)

## 2021-05-24 LAB — LIPID PANEL
Cholesterol: 133 mg/dL (ref 0–200)
HDL: 38.6 mg/dL — ABNORMAL LOW (ref >39.00)
LDL Cholesterol: 78 mg/dL (ref 0–99)
NonHDL: 94.86
Total CHOL/HDL Ratio: 3
Triglycerides: 83 mg/dL (ref 0.0–149.0)
VLDL: 16.6 mg/dL (ref 0.0–40.0)

## 2021-05-24 LAB — T4, FREE: Free T4: 0.73 ng/dL (ref 0.60–1.60)

## 2021-05-24 LAB — TSH: TSH: 3.36 u[IU]/mL (ref 0.35–5.50)

## 2021-05-24 LAB — VITAMIN D 25 HYDROXY (VIT D DEFICIENCY, FRACTURES): VITD: 47.83 ng/mL (ref 30.00–100.00)

## 2021-06-14 ENCOUNTER — Telehealth: Payer: Self-pay

## 2021-06-14 DIAGNOSIS — M810 Age-related osteoporosis without current pathological fracture: Secondary | ICD-10-CM

## 2021-06-14 NOTE — Telephone Encounter (Signed)
Benefits submitted-pending Next injection due 06/28/21 Need repeat labs due to recent calcium abnormal.

## 2021-06-18 NOTE — Addendum Note (Signed)
Addended by: Kris Mouton on: 06/18/2021 04:11 PM   Modules accepted: Orders

## 2021-06-18 NOTE — Telephone Encounter (Signed)
OOP cost is $301. PA submitted via fax number-waiting for decision. Lab 06/19/21 and NV 07/02/21

## 2021-06-19 ENCOUNTER — Other Ambulatory Visit (INDEPENDENT_AMBULATORY_CARE_PROVIDER_SITE_OTHER): Payer: Medicare HMO

## 2021-06-19 DIAGNOSIS — M810 Age-related osteoporosis without current pathological fracture: Secondary | ICD-10-CM

## 2021-06-19 LAB — BASIC METABOLIC PANEL
BUN: 7 mg/dL (ref 6–23)
CO2: 29 mEq/L (ref 19–32)
Calcium: 8.5 mg/dL (ref 8.4–10.5)
Chloride: 102 mEq/L (ref 96–112)
Creatinine, Ser: 0.74 mg/dL (ref 0.40–1.20)
GFR: 78.42 mL/min (ref >60.00)
Glucose, Bld: 127 mg/dL — ABNORMAL HIGH (ref 70–99)
Potassium: 3.4 mEq/L — ABNORMAL LOW (ref 3.5–5.1)
Sodium: 139 mEq/L (ref 135–145)

## 2021-06-26 NOTE — Telephone Encounter (Signed)
CrCl is  56.56 mL/min Clacium normal at 8.5

## 2021-06-27 NOTE — Telephone Encounter (Signed)
PA approved 06/18/21-06/19/22-insurance will re fax this authorization to [place] to scan

## 2021-07-02 ENCOUNTER — Ambulatory Visit: Payer: Medicare HMO

## 2021-07-12 ENCOUNTER — Ambulatory Visit (INDEPENDENT_AMBULATORY_CARE_PROVIDER_SITE_OTHER): Payer: Medicare HMO | Admitting: Family Medicine

## 2021-07-12 ENCOUNTER — Encounter: Payer: Self-pay | Admitting: Family Medicine

## 2021-07-12 ENCOUNTER — Telehealth: Payer: Self-pay | Admitting: Family Medicine

## 2021-07-12 VITALS — BP 130/80 | HR 73 | Temp 97.7°F | Ht 59.75 in | Wt 123.1 lb

## 2021-07-12 DIAGNOSIS — G3184 Mild cognitive impairment, so stated: Secondary | ICD-10-CM | POA: Diagnosis not present

## 2021-07-12 DIAGNOSIS — M545 Low back pain, unspecified: Secondary | ICD-10-CM

## 2021-07-12 DIAGNOSIS — R404 Transient alteration of awareness: Secondary | ICD-10-CM | POA: Diagnosis not present

## 2021-07-12 MED ORDER — RIVASTIGMINE 4.6 MG/24HR TD PT24: 4.6000 mg | MEDICATED_PATCH | Freq: Every day | TRANSDERMAL | 3 refills | Status: DC

## 2021-07-12 NOTE — Progress Notes (Unsigned)
Patient ID: Marilyn Cook, female    DOB: 1945-08-23, 76 y.o.   MRN: [id number]  This visit was conducted in person.  BP 130/80   Pulse 73   Temp 97.7 F (36.5 C) (Temporal)   Ht 4' 11.75" (1.518 m)   Wt 123 lb 2 oz (55.8 kg)   SpO2 97%   BMI 24.25 kg/m    CC: discuss anxiety  Subjective:   HPI: Marilyn Cook is a 76 y.o. female presenting on 07/12/2021 for Anxiety (Pt accompanied by son, Dellis Filbert. )   A few weeks ago home alarm went off - as she forgot her code which she's been using for the past 3 years.   Son notes ongoing anxiety - she's afraid to go into basement. She sometimes sees things that aren't there - a person instead of a basketball backboard, alarming shapes in the tree-line. Sometimes sees colorful lines in vision. This happens about once a month.  Upcoming eye exam.   No parkinsonian symptoms of bradykinesia, stiffness, rigidity, tremor.  1-2 yrs ago did fall out of bed but no other symptoms suggestive of REM sleep disorder.   She's been having lower back pain - using salon pas patches with benefit.   Ran out of plavix a few weeks ago - there are refills available at pharmacy.   Geriatric Assessment: Activities of Daily Living:     Bathing- independent     Dressing- independent     Eating- independent     Toileting- independent     Transferring- independent     Continence- independent  Overall Assessment: independent   Instrumental Activities of Daily Living:     Transportation- independent     Meal/Food Preparation- independent     Shopping Errands- independent     Housekeeping/Chores- independent     Money Management/Finances- independent     Medication Management- independent     Ability to Use Telephone- independent     Laundry- independent  Overall Assessment:  independent   Mental Status Exam: 23/30 (value/max value)     Clock Drawing Score: 1/4     Relevant past medical, surgical, family and social history reviewed and updated as  indicated. Interim medical history since our last visit reviewed. Allergies and medications reviewed and updated. Outpatient Medications Prior to Visit  Medication Sig Dispense Refill   atorvastatin (LIPITOR) 40 MG tablet Take 1 tablet (40 mg total) by mouth daily. 90 tablet 3   Calcium Carb-Cholecalciferol (CALCIUM-VITAMIN D) 600-400 MG-UNIT TABS Take 1 tablet by mouth 2 (two) times daily.      cetirizine (ZYRTEC) 10 MG tablet Take 10 mg by mouth daily.     clopidogrel (PLAVIX) 75 MG tablet Take 1 tablet (75 mg total) by mouth daily. 90 tablet 3   co-enzyme Q-10 30 MG capsule Take 30 mg by mouth daily.     hydrocortisone 2.5 % cream Apply 1 application topically 2 (two) times daily as needed (itching).     levothyroxine (SYNTHROID) 25 MCG tablet Take 1 tablet (25 mcg total) by mouth daily before breakfast. 90 tablet 3   Potassium Chloride (K-TABS PO) Take 1 tablet by mouth daily.     Pyridoxine HCl (VITAMIN B-6 PO) Take 1 tablet by mouth daily.     triamcinolone (KENALOG) 0.147 MG/GM topical spray Apply topically 2 (two) times daily as needed. For psoriasis 63 g 1   vitamin B-12 (CYANOCOBALAMIN) 500 MCG tablet Take 1 tablet (500 mcg total) by mouth every Monday,  Wednesday, and Friday.     No facility-administered medications prior to visit.     Per HPI unless specifically indicated in ROS section below Review of Systems  Objective:  BP 130/80   Pulse 73   Temp 97.7 F (36.5 C) (Temporal)   Ht 4' 11.75" (1.518 m)   Wt 123 lb 2 oz (55.8 kg)   SpO2 97%   BMI 24.25 kg/m   Wt Readings from Last 3 Encounters:  07/12/21 123 lb 2 oz (55.8 kg)  05/21/21 122 lb 2 oz (55.4 kg)  05/08/21 125 lb (56.7 kg)      Physical Exam Vitals and nursing note reviewed.  Constitutional:      Appearance: Normal appearance. She is not ill-appearing.  Musculoskeletal:        General: No tenderness. Normal range of motion.     Comments: No midline lumbar spine or paraspinous mm discomfort   Skin:     General: Skin is warm and dry.     Findings: No rash.  Neurological:     Mental Status: She is alert.  Psychiatric:        Mood and Affect: Mood normal.        Behavior: Behavior normal.     Comments: Pleasant, tangential        Lab Results  Component Value Date   TSH 3.36 05/24/2021    Lab Results  Component Value Date   VITAMINB12 1,358 (H) 09/13/2019   RPR negative (09/13/2019)  CT HEAD WO CONTRAST CLINICAL DATA:  Dizziness, syncope  EXAM: CT HEAD WITHOUT CONTRAST  TECHNIQUE: Contiguous axial images were obtained from the base of the skull through the vertex without intravenous contrast.  COMPARISON:  MR brain, 07/21/2014  FINDINGS: Brain: No evidence of acute infarction, hemorrhage, hydrocephalus, extra-axial collection or mass lesion/mass effect. Periventricular white matter hypodensity.  Vascular: No hyperdense vessel or unexpected calcification.  Skull: Normal. Negative for fracture or focal lesion.  Sinuses/Orbits: No acute finding.  Other: None.  IMPRESSION: No acute intracranial pathology. Small-vessel white matter disease.  Electronically Signed   By: Eddie Candle M.D.   On: 08/10/2020 18:23   Assessment & Plan:   Problem List Items Addressed This Visit     MCI (mild cognitive impairment) with memory loss - Primary    Concern for worsening memory difficulty ?dementia with LB given possible visual hallucinations. Has had fall out of bed a few years ago, but otherwise difficult to determine if symptoms of REM sleep behavior disorder present as she is a widow and lives alone.  Overall independent in ADL/IADLs however significant impairment based on MMSE and CDT scores. Now noting difficulty with worsening memory and trouble managing meds (off of plavix for 3 wks, refills available at pharmacy).  Will start excelon patch (previous GI upset intolerance to oral aricept). This may help with noted anxiety. Update with effect.  Previously saw Dr Tomi Likens -  will refer back to him for further evaluation of possible developing dementia.       Relevant Orders   Ambulatory referral to Neurology   Transient alteration of awareness    Remains on lipitor and plavix for h/o possible TIA, previously saw Dr Loretta Plume ([id number]) - will refer back to him.        Right-sided low back pain without sciatica    Overall benign exam. Continue salon pas patches.         Meds ordered this encounter  Medications   rivastigmine (EXELON) 4.6 mg/24hr  Sig: Place 1 patch (4.6 mg total) onto the skin daily.    Dispense:  30 patch    Refill:  3   Orders Placed This Encounter  Procedures   Ambulatory referral to Neurology    Referral Priority:   Routine    Referral Type:   Consultation    Referral Reason:   Specialty Services Required    Requested Specialty:   Neurology    Number of Visits Requested:   1    Patient instructions: Good to see you today.  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  Try excelon 4.'6mg'$  patch once daily - if tolerating well, let me know and we will increase dose.  We will refer you to neurologist for further evaluation.   Follow up plan: Return in about 3 months (around 10/12/2021), or if symptoms worsen or fail to improve, for follow up visit.  Ria Bush, MD

## 2021-07-12 NOTE — Telephone Encounter (Signed)
Patient son, [name], came back into office and was stating that the medication rivastigmine (EXELON) 4.6 mg/24hr was to expensive ($300) and was wondering if there was something else the patient can take or use. Patient son, [name], would like for someone to call him to discuss this matter or to just call in another option if possible. Thank you  Callback #: [phone]

## 2021-07-12 NOTE — Patient Instructions (Addendum)
Good to see you today.  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  Try excelon 4.'6mg'$  patch once daily - if tolerating well, let me know and we will increase dose.  We will refer you to neurologist for further evaluation.   Lewy Body Dementia Lewy body dementia, also called dementia with Lewy bodies, is a condition that affects the way the brain functions. It is one form of dementia. In this condition, proteins called Lewy bodies build up in certain areas of the brain. This causes problems with: Memory. Decision making. Behavior. Speaking. Thinking and problem solving. Movements and balance. This condition is progressive, which means that it gets worse with time (is degenerative) and cannot be reversed. What are the causes? This condition is caused by the buildup of Lewy bodies in brain cells in areas of the brain that control memory, thinking, and movement. It is not known what causes the Lewy bodies to build up. What increases the risk? You are more likely to develop this condition if you: Have a family history of Lewy body dementia or Parkinson's disease. Are 2 years old or older. Are female. What are the signs or symptoms? Symptoms of this condition may include: Seeing things that are not there (hallucinating). Sleep problems, such as acting out dreams while you are asleep. Changes in memory, attention, and concentration that come and go (fluctuate). Symptoms of dementia, such as: Trouble with memory. Trouble paying attention. Problems with planning and organizing. Problems with judgment. Behavioral problems. Symptoms of Parkinson's disease, such as: Shaking movements that you cannot control (tremor). Tremors usually start in a hand or foot when you are resting (resting tremor). Stooped posture. Slowing of movement. Stiff muscles (rigidity). Loss of balance and stability when standing. How is this diagnosed? This condition is  diagnosed by a specialist who diagnoses and treats this condition (neurologist). Your health care provider will talk with you and your family, friends, or caregivers about your history and symptoms. A thorough medical history will be taken, and you will have a physical exam and tests. Tests may include: Lab tests, such as blood or urine tests. Imaging tests, such as a CT scan, a PET scan, or an MRI. A test that involves removing and testing a small amount of the fluid that surrounds the brain and spinal cord (lumbar puncture). Tests that evaluate brain function, such as memory tests, cognitive tests, and neuropsychological tests. How is this treated? There is no cure for this condition. Treatment focuses on managing your symptoms. Treatment may include: Medicines to help with hallucinations, sleep, and behavioral problems. Speech, occupational, and physical therapy. Your health care provider can help direct you to support groups, organizations, and other health care providers who can help with decisions about your care. Follow these instructions at home: Medicines Take over-the-counter and prescription medicines only as told by your health care provider. To help you manage your medicines, use a pill organizer or pill reminder. Avoid taking medicines that can affect thinking, such as pain medicines or certain sleeping medicines. Always check with your health care provider before taking any new medicines. Lifestyle Make healthy lifestyle choices: Be physically active as told by your health care provider. Do not use any products that contain nicotine or tobacco, such as cigarettes, e-cigarettes, and chewing tobacco. If you need help quitting, ask your health care provider. Try to practice stress-management techniques when you experience stress, such as mindfulness, yoga, or deep breathing. Stay socially connected. Talk regularly with other  people, such as family, friends, and neighbors. Make sure  you sleep well. These tips can help you get a good night's rest: Avoid napping during the day. Keep your sleeping area dark and cool. Avoid exercising a few hours before you go to bed. Avoid caffeine products in the evening. Eating and drinking Do not drink alcohol. Drink enough fluid to keep your urine pale yellow. Eat a healthy diet. Safety  Work with your health care provider to determine what you need help with and what your safety needs are. If you have trouble moving around, use a cane or walker as told by your health care provider. Make sure your home environment is safe. To do this: Remove things that can be a tripping hazard, such as throw rugs or clutter. Install grab bars and railings in your home to prevent falls. Talk with your health care provider about whether it is safe for you to drive. If you were given a bracelet that identifies you as a person with memory loss or tracks your location, make sure to wear it at all times. General instructions  Work with your family to make important decisions, such as advance directives, medical power of attorney, or a living will. Keep all follow-up visits. This is important. Where to find more information [employer] of Neurological Disorders and Stroke: [employer] Contact a health care provider if you have: New or worsening confusion. New or worsening trouble with sleeping or increased daytime sleepiness. Problems with choking or swallowing. Get help right away if: You feel depressed or sad, or feel that you want to harm yourself. Your family members become concerned for your safety. If you ever feel like you may hurt yourself or others, or have thoughts about taking your own life, get help right away. Go to your nearest emergency department or: Call your local emergency services (911 in the [place] Call a suicide crisis helpline, such as the [place] at 430-114-3631 or 988 in the [place].  This is open 24 hours a day in the [place] Text the Crisis Text Line at 320-613-7884 (in the [place].). Summary Lewy body dementia is a condition that affects the way the brain functions. It causes problems with functions such as memory and thinking. Lewy body dementia gets worse with time. This condition is caused by the buildup of proteins called Lewy bodies in brain cells. It is not known what causes the Lewy bodies to build up. Work with your health care provider to determine what you need help with and what your safety needs are. Your health care provider can help direct you to support groups, organizations, and other health care providers who can help with decisions about your care. This information is not intended to replace advice given to you by your health care provider. Make sure you discuss any questions you have with your health care provider. Document Revised: 07/18/2020 Document Reviewed: 05/09/2019 Elsevier Patient Education  [place].

## 2021-07-12 NOTE — Telephone Encounter (Addendum)
Can we see if we do PA it will be more affordable? Would let son know we will try this.  She cannot take oral aricept (donepezil) due to GI upset.

## 2021-07-13 DIAGNOSIS — M545 Low back pain, unspecified: Secondary | ICD-10-CM

## 2021-07-13 HISTORY — DX: Low back pain, unspecified: M54.50

## 2021-07-13 NOTE — Assessment & Plan Note (Addendum)
Concern for worsening memory difficulty ?dementia with LB given possible visual hallucinations. Has had fall out of bed a few years ago, but otherwise difficult to determine if symptoms of REM sleep behavior disorder present as she is a widow and lives alone.  Overall independent in ADL/IADLs however significant impairment based on MMSE and CDT scores. Now noting difficulty with worsening memory and trouble managing meds (off of plavix for 3 wks, refills available at pharmacy).  Will start excelon patch (previous GI upset intolerance to oral aricept). This may help with noted anxiety. Update with effect.  Previously saw Dr Tomi Likens - will refer back to him for further evaluation of possible developing dementia.

## 2021-07-13 NOTE — Assessment & Plan Note (Signed)
Overall benign exam. Continue salon pas patches.

## 2021-07-13 NOTE — Assessment & Plan Note (Signed)
Remains on lipitor and plavix for h/o possible TIA, previously saw Dr Loretta Plume (2017) - will refer back to him.

## 2021-07-15 NOTE — Telephone Encounter (Signed)
I started a prior auth for Rivastigmine 4.6 mg/24 hr.  [name] Key: [id number]  On Cover my meds, it states:  The patient currently has access to the requested medication and a Prior Authorization is not needed for the patient/medication.

## 2021-07-22 MED ORDER — RIVASTIGMINE TARTRATE 1.5 MG PO CAPS: 1.5000 mg | ORAL_CAPSULE | Freq: Two times a day (BID) | ORAL | 0 refills | Status: DC

## 2021-07-22 NOTE — Telephone Encounter (Signed)
Patient's son calling back to follow-up on the medication expense....  Pharmacist stated that need to prescribe something else...  Since no PA needed, cost of medication did not reduce...  Please call the son at [phone], he wants to know

## 2021-07-22 NOTE — Telephone Encounter (Signed)
Spoke with [facility] asking if cheaper alternative available for Exelon patch.  States they are not showing anything. Suggests pt contact ins co to see what is available.   Spoke pt's son, Marilyn Cook (on dpr), notifying him Dr. Darnell Level is going to go ahead and send rx for Exelon pill.  Suggests he speak with the pharmacy when notified rx is ready to see if cost is more affordable. If so and pt starts med, Dr. Darnell Level wants to caution about GI sxs.  Marilyn Cook verbalizes understanding and expresses his thanks.

## 2021-07-22 NOTE — Addendum Note (Signed)
Addended by: Ria Bush on: 07/22/2021 10:56 AM   Modules accepted: Orders

## 2021-07-22 NOTE — Telephone Encounter (Addendum)
I've sent in Exelon pills to price out.  Watch for GI upset.

## 2021-07-22 NOTE — Telephone Encounter (Addendum)
I never received last week's update.  Please check with pharmacy to see if there's any more affordable alternative to the excelon patch - is the excelon pill more affordable? May try excelon pill if cheaper option.  Avoiding Aricept due to GI upset.

## 2021-07-30 ENCOUNTER — Telehealth: Payer: Self-pay

## 2021-07-30 DIAGNOSIS — H04202 Unspecified epiphora, left lacrimal gland: Secondary | ICD-10-CM | POA: Diagnosis not present

## 2021-07-30 DIAGNOSIS — H52223 Regular astigmatism, bilateral: Secondary | ICD-10-CM | POA: Diagnosis not present

## 2021-07-30 DIAGNOSIS — H2513 Age-related nuclear cataract, bilateral: Secondary | ICD-10-CM | POA: Diagnosis not present

## 2021-07-30 DIAGNOSIS — H524 Presbyopia: Secondary | ICD-10-CM | POA: Diagnosis not present

## 2021-07-30 DIAGNOSIS — H04123 Dry eye syndrome of bilateral lacrimal glands: Secondary | ICD-10-CM | POA: Diagnosis not present

## 2021-07-30 DIAGNOSIS — H0288B Meibomian gland dysfunction left eye, upper and lower eyelids: Secondary | ICD-10-CM | POA: Diagnosis not present

## 2021-07-30 NOTE — Telephone Encounter (Signed)
Patient missed her Prolia injection visit on 07/02/21.  Is it ok to use labs from 06/19/21 prior to injection or need to repeat?

## 2021-07-31 NOTE — Telephone Encounter (Signed)
Nurse visit rescheduled to 08/01/21

## 2021-07-31 NOTE — Telephone Encounter (Signed)
Patient advised and appointment made for 08/01/21

## 2021-07-31 NOTE — Telephone Encounter (Addendum)
Ok to use labs from last month. Plz ensure she continues to take cal/vit D supplement BID

## 2021-08-01 ENCOUNTER — Ambulatory Visit (INDEPENDENT_AMBULATORY_CARE_PROVIDER_SITE_OTHER): Payer: Medicare HMO

## 2021-08-01 DIAGNOSIS — M810 Age-related osteoporosis without current pathological fracture: Secondary | ICD-10-CM

## 2021-08-01 MED ORDER — DENOSUMAB 60 MG/ML ~~LOC~~ SOSY
60.0000 mg | PREFILLED_SYRINGE | Freq: Once | SUBCUTANEOUS | Status: AC
  Administered 2021-08-01: 60 mg via SUBCUTANEOUS

## 2021-08-01 NOTE — Progress Notes (Signed)
Per orders of Dr. Duncan, injection of Prolia given by Leonce Bale. Patient tolerated injection well.  

## 2021-08-01 NOTE — Telephone Encounter (Signed)
Pt received Prolia inj today. Fyi to Anastasiya.

## 2021-08-02 ENCOUNTER — Other Ambulatory Visit (INDEPENDENT_AMBULATORY_CARE_PROVIDER_SITE_OTHER): Payer: Medicare HMO

## 2021-08-02 ENCOUNTER — Ambulatory Visit: Payer: Medicare HMO | Admitting: Physician Assistant

## 2021-08-02 ENCOUNTER — Encounter: Payer: Self-pay | Admitting: Physician Assistant

## 2021-08-02 VITALS — BP 139/84 | HR 84 | Ht 62.0 in | Wt 122.0 lb

## 2021-08-02 DIAGNOSIS — F0150 Vascular dementia without behavioral disturbance: Secondary | ICD-10-CM | POA: Diagnosis not present

## 2021-08-02 DIAGNOSIS — G309 Alzheimer's disease, unspecified: Secondary | ICD-10-CM

## 2021-08-02 DIAGNOSIS — R413 Other amnesia: Secondary | ICD-10-CM

## 2021-08-02 DIAGNOSIS — F0280 Dementia in other diseases classified elsewhere without behavioral disturbance: Secondary | ICD-10-CM | POA: Insufficient documentation

## 2021-08-02 DIAGNOSIS — R69 Illness, unspecified: Secondary | ICD-10-CM | POA: Diagnosis not present

## 2021-08-02 LAB — VITAMIN B12: Vitamin B-12: 147 pg/mL — ABNORMAL LOW (ref 211–911)

## 2021-08-02 LAB — TSH: TSH: 2.13 u[IU]/mL (ref 0.35–5.50)

## 2021-08-02 NOTE — Progress Notes (Cosign Needed)
Assessment/Plan:   The patient is seen in neurologic consultation at the request of Ria Bush, MD for the evaluation of memory.  Marilyn Cook is a very pleasant 76 y.o. year old RH female with  a history of hypertension, hyperlipidemia, TIA 2017, seen today for evaluation of memory loss. MoCA today is 15/30 with delayed recall 0/5, visuospatial executive 2/5, she also had difficulty with naming, abstraction and date.  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  It is unclear if the patient has visual hallucinations, as she does report that she has a history of cataracts and glaucoma and is due to see an ophthalmologist soon.  Her son is not quite sure indeed that these are hallucinations versus visual distortion due to eye disease.  Patient is currently on rivastigmine 1.5 mg twice daily, however in view of these recent results, recommend increasing rivastigmine to 3 mg twice daily as per PCP who placed her on the mend is seen initially when she was diagnosed with MCI (she was unable to tolerate Aricept due to GI complaints).  Work-up is in progress   MRI brain without contrast to assess for underlying structural abnormality and assess vascular load  Neurocognitive testing to further evaluate cognitive concerns and determine other underlying cause of memory changes, including potential contribution from sleep, anxiety, or depression  Check B12, TSH Continue rivastigmine 1.5 mg bid  Patient is due to have an appointment with ophthalmology soon. Folllow up in 1 month  Subjective:     The patient is accompanied by her son who supplements the history.    How long did patient have memory difficulties? She denies any memory changes, but her son reports that since 19-Mar-2022 after her husband died. Son was concerned because about 1 month ago she did not remember the 4 digits of the house alarm "was trying to do 3 digits, but later that afternoon, she  remembered that there were 4 digits".  "She used to be a whiz with numbers, not now ".  Most of the concerns are due to short-term memory.  There was an episode recently, where her son told the story, and immediately, she repeated the story without recall that her son had just said the same thing. Patient lives with:  Patient lives alone after losing her husband but her son lives next door repeats oneself?  "Over and over for years, but we always joked about it, never made much of it ".  Disoriented when walking into a room?  Patient denies   Leaving objects in unusual places?  Patient denies   Ambulates  with difficulty?   Patient denies   Recent falls?  Patient denies   Any head injuries?  Patient denies   History of seizures?   Patient denies. She was evaluated in 2017 for transient alteration of awareness but it was negative for seizures. Wandering behavior?  Patient denies   Patient drives?   Denies any issues, tries to stay off of the highway.  Son reports that she is a very good driver to date. Any mood changes such irritability agitation?  Denies  Any history of depression?:  Patient denies   Hallucinations? Son reports she sees things, like a light at nigh and a ring around it, but one da instead of basketball, she thought she saw a person.  Sometimes she states is shaped like a leaf, but she thinks it is an Tax inspector.  Her son is not  sure if this is an hallucination or is distorted image because of her history of glaucoma.  If she senses that is something moving in the woods, she is afraid to go outside. Paranoia?  "She was paranoid about a neighbor for a while, but even after his death, and other people moving into that house she is still gets anxious about passing by that house ".  She is also afraid of the darkness, and does not like to go downstairs to the laundry room, so she washes all her clothing by hand although she is working on it ".   Patient reports that he sleeps well  without vivid dreams, REM behavior or sleepwalking   History of sleep apnea?  Patient denies   Any hygiene concerns?  Denies Independent of bathing and dressing?  Endorsed  Does the patient needs help with medications? Son monitors, she has a pill tray  Who is in charge of the finances?  Patient is in charge but son monitors, she doesn't like [employer] so she would not pay so she got behind.  "She doesn't like to spend money ". Any changes in appetite?  "Never has been a good eater ".  Patient have trouble swallowing? Patient denies   Does the patient cook?  Patient denies   Any kitchen accidents such as leaving the stove on? Patient denies   Any headaches?  Patient denies   Double vision? Patient denies   Any focal numbness or tingling?  Patient denies   Chronic back pain Patient denies   Unilateral weakness?  Patient denies   Any tremors?  Patient denies   Any history of anosmia?  Patient denies   Any incontinence of urine?  Patient denies   Any bowel dysfunction?   Patient denies    History of heavy alcohol intake?  Patient denies   History of heavy tobacco use?  Patient denies   Family history of dementia?   Aunt with dementia    Past Medical History:  Diagnosis Date   Benign tumor of breast 1962   Complication of anesthesia    hard to wake up   Hyperlipemia    NSVD (normal spontaneous vaginal delivery)    x 1   Osteopenia 08/2012   spine WNL, hip T -1.2   Scalp psoriasis    Stroke ([place])    possible TIA in 06/2014   TIA (transient ischemic attack) 06/08/2014     Past Surgical History:  Procedure Laterality Date   ABDOMINAL HYSTERECTOMY     COLONOSCOPY  04/2015   diverticulosis, int hem, rpt 10 yrs (Nandigam)   DEXA  08/2012   spine WNL, hip T -1.2   PARTIAL HYSTERECTOMY  1987   due to fibroids, ovaries remain   R Lat Epicondyle Injections  1998   SHOULDER ARTHROSCOPY Right 11/2014   arthroscopic debridement and mini open rotator cuff repair of R shoulder for chronic  impingement and rotator cuff tear by MRI [facility])   Spiral CT  04/02   (-)     Allergies  Allergen Reactions   Aspirin     Unknown reaction   Diphenhydramine Hcl    Donepezil Other (See Comments)    GI upset   Eggs Or Egg-Derived Products Hives and Swelling   Epinephrine     REACTION: UNSPECIFIED   Latex     REACTION: swelling   Other Other (See Comments)    GI upset, headache to DAIRY PRODUCTS   Penicillins    Sulfasalazine  Current Outpatient Medications  Medication Instructions   atorvastatin (LIPITOR) 40 mg, Oral, Daily   Calcium Carb-Cholecalciferol (CALCIUM-VITAMIN D) 600-400 MG-UNIT TABS 1 tablet, Oral, 2 times daily   cetirizine (ZYRTEC) 10 mg, Daily   clopidogrel (PLAVIX) 75 mg, Oral, Daily   co-enzyme Q-10 30 mg, Daily   cyanocobalamin (VITAMIN B12) 500 mcg, Oral, Every M-W-F   hydrocortisone 2.5 % cream 1 application , 2 times daily PRN   levothyroxine (SYNTHROID) 25 mcg, Oral, Daily before breakfast   Potassium Chloride (K-TABS PO) 1 tablet, Oral, Daily   Pyridoxine HCl (VITAMIN B-6 PO) 1 tablet, Daily   rivastigmine (EXELON) 4.6 mg, Transdermal, Daily   rivastigmine (EXELON) 1.5 mg, Oral, 2 times daily   triamcinolone (KENALOG) 0.147 MG/GM topical spray Topical, 2 times daily PRN, For psoriasis     VITALS:   Vitals:   08/02/21 0756  BP: 139/84  Pulse: 84  SpO2: 98%  Weight: 122 lb (55.3 kg)  Height: '5\' 2"'$  (1.575 m)    PHYSICAL EXAM   HEENT:  Normocephalic, atraumatic. The mucous membranes are moist. The superficial temporal arteries are without ropiness or tenderness. Cardiovascular: Regular rate and rhythm. Lungs: Clear to auscultation bilaterally. Neck: There are no carotid bruits noted bilaterally.  NEUROLOGICAL:     No data to display             02/17/2020    1:48 PM 02/14/2019    3:01 PM  MMSE - Mini Mental State Exam  Not completed: Unable to complete   Orientation to time  5  Orientation to Place  5  Registration  3   Attention/ Calculation  5  Recall  3  Language- repeat  1     Orientation:  Alert and oriented to person, place and year. No aphasia or dysarthria. Fund of knowledge is appropriate. Recent and remote memory are impaired.  Attention and concentration are reduced.  Able to name objects 2/3 and repeat phrases 1/2. Delayed recall 0/5.  The date is August 30, 2021. cranial nerves: There is good facial symmetry. Extraocular muscles are intact and visual fields are full to confrontational testing. Speech is fluent and clear. Soft palate rises symmetrically and there is no tongue deviation. Hearing is intact to conversational tone. Tone: Tone is good throughout. Sensation: Sensation is intact to light touch and pinprick throughout. Vibration is intact at the bilateral big toe.There is no extinction with double simultaneous stimulation. There is no sensory dermatomal level identified. Coordination: The patient has no difficulty with RAM's or FNF bilaterally. Normal finger to nose  Motor: Strength is 5/5 in the bilateral upper and lower extremities. There is no pronator drift. There are no fasciculations noted. DTR's: Deep tendon reflexes are 2/4 at the bilateral biceps, triceps, brachioradialis, patella and achilles.  Plantar responses are downgoing bilaterally. Gait and Station: The patient is able to ambulate without difficulty.The patient is able to heel toe walk without any difficulty.The patient is able to ambulate in a tandem fashion. The patient is able to stand in the Romberg position.    Thank you for allowing [place] the opportunity to participate in the care of this nice patient. Please do not hesitate to contact us for any questions or concerns.   Total time spent on today's visit was 61 minutes dedicated to this patient today, preparing to see patient, examining the patient, ordering tests and/or medications and counseling the patient, documenting clinical information in the EHR or other health  record, independently interpreting results and communicating  results to the patient/family, discussing treatment and goals, answering patient's questions and coordinating care.  Cc:  Ria Bush, MD  Sharene Butters 08/02/2021 8:40 AM

## 2021-08-02 NOTE — Patient Instructions (Addendum)
It was a pleasure to see you today at our office.   Recommendations:  Neurocognitive evaluation at our office MRI of the brain, the radiology office will call you to arrange you appointment Check labs today Continue Exelon but recommend increasing to 3 mg twice daily as per your primary doctor  Have your eyes check with eye doctor for glaucoma and cataracts  Follow up in 1 month   Whom to call:  Memory  decline, memory medications: Call our office [phone]   For psychiatric meds, mood meds: Please have your primary care physician manage these medications.   Counseling regarding caregiver distress, including caregiver depression, anxiety and issues regarding community resources, adult day care programs, adult living facilities, or memory care questions:   Feel free to contact Jim Thorpe, Social Worker at [phone]   For assessment of decision of mental capacity and competency:  Call Dr. Anthoney Harada, geriatric psychiatrist at [phone]  For guidance in geriatric dementia issues please call [facility] [phone]    If you have any severe symptoms of a stroke, or other severe issues such as confusion,severe chills or fever, etc call 911 or go to the ER as you may need to be evaluated further   Feel free to go to the following database for funded clinical studies conducted around the world: http://saunders.com/   https://www.triadclinicaltrials.com/     RECOMMENDATIONS FOR ALL PATIENTS WITH MEMORY PROBLEMS: 1. Continue to exercise (Recommend 30 minutes of walking everyday, or 3 hours every week) 2. Increase social interactions - continue going to [place] and enjoy social gatherings with friends and family 3. Eat healthy, avoid fried foods and eat more fruits and vegetables 4. Maintain adequate blood pressure, blood sugar, and blood cholesterol level. Reducing the risk of stroke and cardiovascular disease also helps promoting better  memory. 5. Avoid stressful situations. Live a simple life and avoid aggravations. Organize your time and prepare for the next day in anticipation. 6. Sleep well, avoid any interruptions of sleep and avoid any distractions in the bedroom that may interfere with adequate sleep quality 7. Avoid sugar, avoid sweets as there is a strong link between excessive sugar intake, diabetes, and cognitive impairment We discussed the Mediterranean diet, which has been shown to help patients reduce the risk of progressive memory disorders and reduces cardiovascular risk. This includes eating fish, eat fruits and green leafy vegetables, nuts like almonds and hazelnuts, walnuts, and also use olive oil. Avoid fast foods and fried foods as much as possible. Avoid sweets and sugar as sugar use has been linked to worsening of memory function.  There is always a concern of gradual progression of memory problems. If this is the case, then we may need to adjust level of care according to patient needs. Support, both to the patient and caregiver, should then be put into place.      You have been referred for a neuropsychological evaluation (i.e., evaluation of memory and thinking abilities). Please bring someone with you to this appointment if possible, as it is helpful for the doctor to hear from both you and another adult who knows you well. Please bring eyeglasses and hearing aids if you wear them.    The evaluation will take approximately 3 hours and has two parts:   The first part is a clinical interview with the neuropsychologist (Dr. Melvyn Novas or Dr. Nicole Kindred). During the interview, the neuropsychologist will speak with you and the individual you brought to the appointment.    The  second part of the evaluation is testing with the doctor's technician Hinton Dyer or Maudie Mercury). During the testing, the technician will ask you to remember different types of material, solve problems, and answer some questionnaires. Your family member will  not be present for this portion of the evaluation.   Please note: We must reserve several hours of the neuropsychologist's time and the psychometrician's time for your evaluation appointment. As such, there is a No-Show fee of $100. If you are unable to attend any of your appointments, please contact our office as soon as possible to reschedule.    FALL PRECAUTIONS: Be cautious when walking. Scan the area for obstacles that may increase the risk of trips and falls. When getting up in the mornings, sit up at the edge of the bed for a few minutes before getting out of bed. Consider elevating the bed at the head end to avoid drop of blood pressure when getting up. Walk always in a well-lit room (use night lights in the walls). Avoid area rugs or power cords from appliances in the middle of the walkways. Use a walker or a cane if necessary and consider physical therapy for balance exercise. Get your eyesight checked regularly.  FINANCIAL OVERSIGHT: Supervision, especially oversight when making financial decisions or transactions is also recommended.  HOME SAFETY: Consider the safety of the kitchen when operating appliances like stoves, microwave oven, and blender. Consider having supervision and share cooking responsibilities until no longer able to participate in those. Accidents with firearms and other hazards in the house should be identified and addressed as well.   ABILITY TO BE LEFT ALONE: If patient is unable to contact 911 operator, consider using LifeLine, or when the need is there, arrange for someone to stay with patients. Smoking is a fire hazard, consider supervision or cessation. Risk of wandering should be assessed by caregiver and if detected at any point, supervision and safe proof recommendations should be instituted.  MEDICATION SUPERVISION: Inability to self-administer medication needs to be constantly addressed. Implement a mechanism to ensure safe administration of the  medications.   DRIVING: Regarding driving, in patients with progressive memory problems, driving will be impaired. We advise to have someone else do the driving if trouble finding directions or if minor accidents are reported. Independent driving assessment is available to determine safety of driving.   If you are interested in the driving assessment, you can contact the following:  [employer] in [place]  [place] [phone] or [phone]    [employer] refers to food and lifestyle choices that are based on the traditions of countries located on the [employer]. This way of eating has been shown to help prevent certain conditions and improve outcomes for people who have chronic diseases, like kidney disease and heart disease. What are tips for following this plan? Lifestyle  Cook and eat meals together with your family, when possible. Drink enough fluid to keep your urine clear or pale yellow. Be physically active every day. This includes: Aerobic exercise like running or swimming. Leisure activities like gardening, walking, or housework. Get 7-8 hours of sleep each night. If recommended by your health care provider, drink red wine in moderation. This means 1 glass a day for nonpregnant women and 2 glasses a day for men. A glass of wine equals 5 oz (150 mL). Reading food labels  Check the serving size of packaged foods. For foods  such as rice and pasta, the serving size refers to the amount of cooked product, not dry. Check the total fat in packaged foods. Avoid foods that have saturated fat or trans fats. Check the ingredients list for added sugars, such as corn syrup. Shopping  At the grocery store, buy most of your food from the areas near the walls of the store. This includes: Fresh fruits and vegetables (produce). Grains,  beans, nuts, and seeds. Some of these may be available in unpackaged forms or large amounts (in bulk). Fresh seafood. Poultry and eggs. Low-fat dairy products. Buy whole ingredients instead of prepackaged foods. Buy fresh fruits and vegetables in-season from local farmers markets. Buy frozen fruits and vegetables in resealable bags. If you do not have access to quality fresh seafood, buy precooked frozen shrimp or canned fish, such as tuna, salmon, or sardines. Buy small amounts of raw or cooked vegetables, salads, or olives from the deli or salad bar at your store. Stock your pantry so you always have certain foods on hand, such as olive oil, canned tuna, canned tomatoes, rice, pasta, and beans. Cooking  Cook foods with extra-virgin olive oil instead of using butter or other vegetable oils. Have meat as a side dish, and have vegetables or grains as your main dish. This means having meat in small portions or adding small amounts of meat to foods like pasta or stew. Use beans or vegetables instead of meat in common dishes like chili or lasagna. Experiment with different cooking methods. Try roasting or broiling vegetables instead of steaming or sauteing them. Add frozen vegetables to soups, stews, pasta, or rice. Add nuts or seeds for added healthy fat at each meal. You can add these to yogurt, salads, or vegetable dishes. Marinate fish or vegetables using olive oil, lemon juice, garlic, and fresh herbs. Meal planning  Plan to eat 1 vegetarian meal one day each week. Try to work up to 2 vegetarian meals, if possible. Eat seafood 2 or more times a week. Have healthy snacks readily available, such as: Vegetable sticks with hummus. Greek yogurt. Fruit and nut trail mix. Eat balanced meals throughout the week. This includes: Fruit: 2-3 servings a day Vegetables: 4-5 servings a day Low-fat dairy: 2 servings a day Fish, poultry, or lean meat: 1 serving a day Beans and legumes: 2 or more  servings a week Nuts and seeds: 1-2 servings a day Whole grains: 6-8 servings a day Extra-virgin olive oil: 3-4 servings a day Limit red meat and sweets to only a few servings a month What are my food choices? Mediterranean diet Recommended Grains: Whole-grain pasta. Brown rice. Bulgar wheat. Polenta. Couscous. Whole-wheat bread. [place]. Vegetables: Artichokes. Beets. Broccoli. Cabbage. Carrots. Eggplant. Green beans. Chard. Kale. Spinach. Onions. Leeks. Peas. Squash. Tomatoes. Peppers. Radishes. Fruits: Apples. Apricots. Avocado. Berries. Bananas. Cherries. Dates. Figs. Grapes. Lemons. Melon. Oranges. Peaches. Plums. Pomegranate. Meats and other protein foods: Beans. Almonds. Sunflower seeds. Pine nuts. Peanuts. [place]. Salmon. Scallops. Shrimp. [place]. Tilapia. Clams. Oysters. Eggs. Dairy: Low-fat milk. Cheese. Greek yogurt. Beverages: Water. Red wine. Herbal tea. Fats and oils: Extra virgin olive oil. Avocado oil. Grape seed oil. Sweets and desserts: [place] yogurt with honey. Baked apples. Poached pears. Trail mix. Seasoning and other foods: Basil. Cilantro. Coriander. Cumin. Mint. Parsley. Sage. Rosemary. Tarragon. Garlic. Oregano. Thyme. Pepper. Balsalmic vinegar. Tahini. Hummus. Tomato sauce. Olives. Mushrooms. Limit these Grains: Prepackaged pasta or rice dishes. Prepackaged cereal with added sugar. Vegetables: Deep fried potatoes (french fries). Fruits: Fruit canned in syrup.  Meats and other protein foods: Beef. Pork. Lamb. Poultry with skin. Hot dogs. [place] Salines. Dairy: Ice cream. Sour cream. Whole milk. Beverages: Juice. Sugar-sweetened soft drinks. Beer. Liquor and spirits. Fats and oils: Butter. Canola oil. Vegetable oil. Beef fat (tallow). Lard. Sweets and desserts: Cookies. Cakes. Pies. Candy. Seasoning and other foods: Mayonnaise. Premade sauces and marinades. The items listed may not be a complete list. Talk with your dietitian about what dietary choices are right for  you. Summary The Mediterranean diet includes both food and lifestyle choices. Eat a variety of fresh fruits and vegetables, beans, nuts, seeds, and whole grains. Limit the amount of red meat and sweets that you eat. Talk with your health care provider about whether it is safe for you to drink red wine in moderation. This means 1 glass a day for nonpregnant women and 2 glasses a day for men. A glass of wine equals 5 oz (150 mL). This information is not intended to replace advice given to you by your health care provider. Make sure you discuss any questions you have with your health care provider. Document Released: 08/16/2015 Document Revised: 09/18/2015 Document Reviewed: 08/16/2015 Elsevier Interactive Patient Education  2017 [employer].

## 2021-08-03 NOTE — Progress Notes (Signed)
Please inform patient her B12 is very low at 147. She needs to be on B12 supplements, follow up with Primary. For now, recommend B12 1000 mcg daily , thyroid is normal thanks

## 2021-08-05 ENCOUNTER — Encounter: Payer: Self-pay | Admitting: Family Medicine

## 2021-08-05 DIAGNOSIS — E538 Deficiency of other specified B group vitamins: Secondary | ICD-10-CM

## 2021-08-05 HISTORY — DX: Deficiency of other specified B group vitamins: E53.8

## 2021-08-09 ENCOUNTER — Other Ambulatory Visit: Payer: Self-pay

## 2021-08-09 ENCOUNTER — Telehealth: Payer: Self-pay | Admitting: Physician Assistant

## 2021-08-09 MED ORDER — RIVASTIGMINE TARTRATE 3 MG PO CAPS: 3.0000 mg | ORAL_CAPSULE | Freq: Two times a day (BID) | ORAL | 3 refills | Status: DC

## 2021-08-09 NOTE — Telephone Encounter (Signed)
Sent new Rx into pharmacy

## 2021-08-09 NOTE — Telephone Encounter (Signed)
Patients son left message with access nurse stating they were in the office last week and were advised to stay on medication but increase the dosage.  The pharmacy has not received the updated/ higher dosage.

## 2021-08-09 NOTE — Telephone Encounter (Signed)
His PCP has been ordering Rivastigimine can we send new Rx in to pharmacy for Rivastigimine '3mg'$  bid. Sending this to clarify orders to do this?

## 2021-08-09 NOTE — Telephone Encounter (Signed)
Clarise Cruz had recommended for PCP to increase Rivastigmine to '3mg'$  BID. Ok to send Rx with refills, thanks

## 2021-08-12 ENCOUNTER — Other Ambulatory Visit: Payer: Medicare HMO

## 2021-08-13 ENCOUNTER — Other Ambulatory Visit: Payer: Self-pay | Admitting: Family Medicine

## 2021-08-13 NOTE — Telephone Encounter (Signed)
Refill request Exelon 1.5 mg Last refill 07/22/21  #60 Last office visit 07/12/21 See allergy/contraindication

## 2021-08-14 NOTE — Telephone Encounter (Signed)
ERx 

## 2021-08-16 ENCOUNTER — Ambulatory Visit
Admission: RE | Admit: 2021-08-16 | Discharge: 2021-08-16 | Disposition: A | Discharge status: Home / Self Care | Payer: Medicare HMO | Source: Ambulatory Visit | Attending: Physician Assistant | Admitting: Physician Assistant

## 2021-08-16 DIAGNOSIS — G319 Degenerative disease of nervous system, unspecified: Secondary | ICD-10-CM | POA: Diagnosis not present

## 2021-08-16 DIAGNOSIS — R413 Other amnesia: Secondary | ICD-10-CM

## 2021-08-16 DIAGNOSIS — I611 Nontraumatic intracerebral hemorrhage in hemisphere, cortical: Secondary | ICD-10-CM | POA: Diagnosis not present

## 2021-08-16 DIAGNOSIS — I6782 Cerebral ischemia: Secondary | ICD-10-CM | POA: Diagnosis not present

## 2021-08-30 DIAGNOSIS — H25013 Cortical age-related cataract, bilateral: Secondary | ICD-10-CM | POA: Diagnosis not present

## 2021-08-30 DIAGNOSIS — H25043 Posterior subcapsular polar age-related cataract, bilateral: Secondary | ICD-10-CM | POA: Diagnosis not present

## 2021-08-30 DIAGNOSIS — H2511 Age-related nuclear cataract, right eye: Secondary | ICD-10-CM | POA: Diagnosis not present

## 2021-08-30 DIAGNOSIS — H2513 Age-related nuclear cataract, bilateral: Secondary | ICD-10-CM | POA: Diagnosis not present

## 2021-08-30 DIAGNOSIS — H40013 Open angle with borderline findings, low risk, bilateral: Secondary | ICD-10-CM | POA: Diagnosis not present

## 2021-09-02 ENCOUNTER — Ambulatory Visit: Payer: Medicare HMO | Admitting: Physician Assistant

## 2021-09-02 VITALS — BP 153/85 | HR 86 | Resp 18 | Ht 66.0 in | Wt 122.0 lb

## 2021-09-02 DIAGNOSIS — F0150 Vascular dementia without behavioral disturbance: Secondary | ICD-10-CM | POA: Diagnosis not present

## 2021-09-02 DIAGNOSIS — G309 Alzheimer's disease, unspecified: Secondary | ICD-10-CM | POA: Diagnosis not present

## 2021-09-02 DIAGNOSIS — F0280 Dementia in other diseases classified elsewhere without behavioral disturbance: Secondary | ICD-10-CM | POA: Diagnosis not present

## 2021-09-02 DIAGNOSIS — R69 Illness, unspecified: Secondary | ICD-10-CM | POA: Diagnosis not present

## 2021-09-02 NOTE — Progress Notes (Signed)
Assessment/Plan:   Mixed Alzheimer's and Vascular Dementia   Marilyn Cook is a very pleasant 76 y.o. RH female with  a history of hypertension, hyperlipidemia, TIA 2017 seen today in follow up for memory loss. MoCA on 08/02/2021 was 15/30.   Patient is currently on rivastigmine 3 mg bid, tolerating well.  MRI of the brain on oh was 1123 has been reviewed, showing chronic small vessel ischemic changes, moderate in the cerebral white matter, and mild in the pons, as well as moderate generalized cerebral atrophy and comparatively mild cerebellar atrophy. Patient returns to go over the MRI results and answer any questions that she may have.  Her last visit was in August 02, 2021.      Follow up in 6  months. Continue rivastigmine 3 mg twice daily Continue B12 daily Recommend good control of cardiovascular risk factors. Proceed with neurocognitive evaluation as scheduled for clarity of the diagnosis and disease trajectory    Subjective:    This patient is accompanied in the office by her son who supplements the history.  Previous records as well as any outside records available were reviewed prior to todays visit.  Patient and son deny any new issues or worsening memory.  They state that no changes are present since her last visit in 07/25/2021.  Patient is tolerating rivastigmine 3 mg twice daily well.   Initial evaluation 07/25/2021 the patient is accompanied by her son who supplements the history.     How long did patient have memory difficulties? She denies any memory changes, but her son reports that since 03-09-22 after her husband died. Son was concerned because about 1 month ago she did not remember the 4 digits of the house alarm "was trying to do 3 digits, but later that afternoon, she remembered that there were 4 digits".  "She used to be a whiz with numbers, not now ".  Most of the concerns are due to short-term memory.  There was an episode recently, where her son told the story, and  immediately, she repeated the story without recall that her son had just said the same thing. Patient lives with:  Patient lives alone after losing her husband but her son lives next door repeats oneself?  "Over and over for years, but we always joked about it, never made much of it ".  Disoriented when walking into a room?  Patient denies   Leaving objects in unusual places?  Patient denies   Ambulates  with difficulty?   Patient denies   Recent falls?  Patient denies   Any head injuries?  Patient denies   History of seizures?   Patient denies. She was evaluated in 2017 for transient alteration of awareness but it was negative for seizures. Wandering behavior?  Patient denies   Patient drives?   Denies any issues, tries to stay off of the highway.  Son reports that she is a very good driver to date. Any mood changes such irritability agitation?  Denies  Any history of depression?:  Patient denies   Hallucinations? Son reports she sees things, like a light at nigh and a ring around it, but one da instead of basketball, she thought she saw a person.  Sometimes she states is shaped like a leaf, but she thinks it is an Tax inspector.  Her son is not sure if this is an hallucination or is distorted image because of her history of glaucoma.  If she senses that is something moving in the  woods, she is afraid to go outside. Paranoia?  "She was paranoid about a neighbor for a while, but even after his death, and other people moving into that house she is still gets anxious about passing by that house ".  She is also afraid of the darkness, and does not like to go downstairs to the laundry room, so she washes all her clothing by hand although she is working on it ".   Patient reports that he sleeps well without vivid dreams, REM behavior or sleepwalking   History of sleep apnea?  Patient denies   Any hygiene concerns?  Denies Independent of bathing and dressing?  Endorsed  Does the patient needs help  with medications? Son monitors, she has a pill tray  Who is in charge of the finances?  Patient is in charge but son monitors, she doesn't like [employer] so she would not pay so she got behind.  "She doesn't like to spend money ". Any changes in appetite?  "Never has been a good eater ".  Patient have trouble swallowing? Patient denies   Does the patient cook?  Patient denies   Any kitchen accidents such as leaving the stove on? Patient denies   Any headaches?  Patient denies   Double vision? Patient denies   Any focal numbness or tingling?  Patient denies   Chronic back pain Patient denies   Unilateral weakness?  Patient denies   Any tremors?  Patient denies   Any history of anosmia?  Patient denies   Any incontinence of urine?  Patient denies   Any bowel dysfunction?   Patient denies    History of heavy alcohol intake?  Patient denies   History of heavy tobacco use?  Patient denies   Family history of dementia?   Aunt with dementia   PREVIOUS MEDICATIONS:   CURRENT MEDICATIONS:  Outpatient Encounter Medications as of 09/02/2021  Medication Sig   atorvastatin (LIPITOR) 40 MG tablet Take 1 tablet (40 mg total) by mouth daily.   Calcium Carb-Cholecalciferol (CALCIUM-VITAMIN D) 600-400 MG-UNIT TABS Take 1 tablet by mouth 2 (two) times daily.    cetirizine (ZYRTEC) 10 MG tablet Take 10 mg by mouth daily.   clopidogrel (PLAVIX) 75 MG tablet Take 1 tablet (75 mg total) by mouth daily.   co-enzyme Q-10 30 MG capsule Take 30 mg by mouth daily.   hydrocortisone 2.5 % cream Apply 1 application topically 2 (two) times daily as needed (itching). (Patient not taking: Reported on 08/02/2021)   levothyroxine (SYNTHROID) 25 MCG tablet Take 1 tablet (25 mcg total) by mouth daily before breakfast.   Potassium Chloride (K-TABS PO) Take 1 tablet by mouth daily.   Pyridoxine HCl (VITAMIN B-6 PO) Take 1 tablet by mouth daily.   rivastigmine (EXELON) 1.5 MG capsule TAKE 1 CAPSULE (1.5 MG TOTAL) BY MOUTH  TWICE A DAY   rivastigmine (EXELON) 3 MG capsule Take 1 capsule (3 mg total) by mouth 2 (two) times daily.   triamcinolone (KENALOG) 0.147 MG/GM topical spray Apply topically 2 (two) times daily as needed. For psoriasis   vitamin B-12 (CYANOCOBALAMIN) 500 MCG tablet Take 1 tablet (500 mcg total) by mouth every Monday, Wednesday, and Friday.   No facility-administered encounter medications on file as of 09/02/2021.       02/17/2020    1:48 PM 02/14/2019    3:01 PM  MMSE - Mini Mental State Exam  Not completed: Unable to complete   Orientation to time  5  Orientation to  Place  5  Registration  3  Attention/ Calculation  5  Recall  3  Language- repeat  1      08/02/2021   12:00 PM  Montreal Cognitive Assessment   Visuospatial/ Executive (0/5) 2  Naming (0/3) 2  Attention: Read list of digits (0/2) 2  Attention: Read list of letters (0/1) 1  Attention: Serial 7 subtraction starting at 100 (0/3) 3  Language: Repeat phrase (0/2) 1  Language : Fluency (0/1) 0  Abstraction (0/2) 0  Delayed Recall (0/5) 0  Orientation (0/6) 4  Total 15  Adjusted Score (based on education) 15    Objective:     PHYSICAL EXAMINATION:    VITALS:   Vitals:   09/02/21 1141  BP: (!) 153/85  Pulse: 86  Resp: 18  SpO2: 99%  Weight: 122 lb (55.3 kg)  Height: '5\' 6"'$  (1.676 m)    GEN:  The patient appears stated age and is in NAD. HEENT:  Normocephalic, atraumatic.   Neurological examination:  General: NAD, well-groomed, appears stated age. Orientation: The patient is alert. Oriented to person, place and date Cranial nerves: There is good facial symmetry.The speech is fluent and clear. No aphasia or dysarthria. Fund of knowledge is appropriate. Recent and remote memory are impaired. Attention and concentration are reduced.  Able to name objects and repeat phrases.  Hearing is intact to conversational tone.    Sensation: Sensation is intact to light touch throughout Motor: Strength is at least  antigravity x4. Tremors: none  DTR's 2/4 in UE/LE     Movement examination: Tone: There is normal tone in the UE/LE Abnormal movements:  no tremor.  No myoclonus.  No asterixis.   Coordination:  There is no decremation with RAM's. Normal finger to nose  Gait and Station: The patient has no difficulty arising out of a deep-seated chair without the use of the hands. The patient's stride length is good.  Gait is cautious and narrow.    Thank you for allowing [place] the opportunity to participate in the care of this nice patient. Please do not hesitate to contact us for any questions or concerns.   Total time spent on today's visit was 21 minutes dedicated to this patient today, preparing to see patient, examining the patient, ordering tests and/or medications and counseling the patient, documenting clinical information in the EHR or other health record, independently interpreting results and communicating results to the patient/family, discussing treatment and goals, answering patient's questions and coordinating care.  Cc:  Ria Bush, MD  Sharene Butters 09/03/2021 12:46 PM

## 2021-09-02 NOTE — Patient Instructions (Addendum)
It was a pleasure to see you today at our office.   Recommendations:  Neurocognitive evaluation at our office Continue Exelon  at 3 mg twice daily as per your primary doctor  Neurocognitive testing  Follow up in Feb 28 at 11:30  Continue B12  daily   Whom to call:  Memory  decline, memory medications: Call our office [phone]   For psychiatric meds, mood meds: Please have your primary care physician manage these medications.   Counseling regarding caregiver distress, including caregiver depression, anxiety and issues regarding community resources, adult day care programs, adult living facilities, or memory care questions:   Feel free to contact [place], Social Worker at [phone]   For assessment of decision of mental capacity and competency:  Call Dr. Anthoney Harada, geriatric psychiatrist at [phone]  For guidance in geriatric dementia issues please call [facility] [phone]    If you have any severe symptoms of a stroke, or other severe issues such as confusion,severe chills or fever, etc call 911 or go to the ER as you may need to be evaluated further      RECOMMENDATIONS FOR ALL PATIENTS WITH MEMORY PROBLEMS: 1. Continue to exercise (Recommend 30 minutes of walking everyday, or 3 hours every week) 2. Increase social interactions - continue going to [place] and enjoy social gatherings with friends and family 3. Eat healthy, avoid fried foods and eat more fruits and vegetables 4. Maintain adequate blood pressure, blood sugar, and blood cholesterol level. Reducing the risk of stroke and cardiovascular disease also helps promoting better memory. 5. Avoid stressful situations. Live a simple life and avoid aggravations. Organize your time and prepare for the next day in anticipation. 6. Sleep well, avoid any interruptions of sleep and avoid any distractions in the bedroom that may interfere with adequate sleep quality 7. Avoid sugar, avoid  sweets as there is a strong link between excessive sugar intake, diabetes, and cognitive impairment We discussed the Mediterranean diet, which has been shown to help patients reduce the risk of progressive memory disorders and reduces cardiovascular risk. This includes eating fish, eat fruits and green leafy vegetables, nuts like almonds and hazelnuts, walnuts, and also use olive oil. Avoid fast foods and fried foods as much as possible. Avoid sweets and sugar as sugar use has been linked to worsening of memory function.  There is always a concern of gradual progression of memory problems. If this is the case, then we may need to adjust level of care according to patient needs. Support, both to the patient and caregiver, should then be put into place.      You have been referred for a neuropsychological evaluation (i.e., evaluation of memory and thinking abilities). Please bring someone with you to this appointment if possible, as it is helpful for the doctor to hear from both you and another adult who knows you well. Please bring eyeglasses and hearing aids if you wear them.    The evaluation will take approximately 3 hours and has two parts:   The first part is a clinical interview with the neuropsychologist (Dr. Melvyn Novas or Dr. Nicole Kindred). During the interview, the neuropsychologist will speak with you and the individual you brought to the appointment.    The second part of the evaluation is testing with the doctor's technician Hinton Dyer or Maudie Mercury). During the testing, the technician will ask you to remember different types of material, solve problems, and answer some questionnaires. Your family member will not be present for  this portion of the evaluation.   Please note: We must reserve several hours of the neuropsychologist's time and the psychometrician's time for your evaluation appointment. As such, there is a No-Show fee of $100. If you are unable to attend any of your appointments, please contact our  office as soon as possible to reschedule.    FALL PRECAUTIONS: Be cautious when walking. Scan the area for obstacles that may increase the risk of trips and falls. When getting up in the mornings, sit up at the edge of the bed for a few minutes before getting out of bed. Consider elevating the bed at the head end to avoid drop of blood pressure when getting up. Walk always in a well-lit room (use night lights in the walls). Avoid area rugs or power cords from appliances in the middle of the walkways. Use a walker or a cane if necessary and consider physical therapy for balance exercise. Get your eyesight checked regularly.  FINANCIAL OVERSIGHT: Supervision, especially oversight when making financial decisions or transactions is also recommended.  HOME SAFETY: Consider the safety of the kitchen when operating appliances like stoves, microwave oven, and blender. Consider having supervision and share cooking responsibilities until no longer able to participate in those. Accidents with firearms and other hazards in the house should be identified and addressed as well.   ABILITY TO BE LEFT ALONE: If patient is unable to contact 911 operator, consider using LifeLine, or when the need is there, arrange for someone to stay with patients. Smoking is a fire hazard, consider supervision or cessation. Risk of wandering should be assessed by caregiver and if detected at any point, supervision and safe proof recommendations should be instituted.  MEDICATION SUPERVISION: Inability to self-administer medication needs to be constantly addressed. Implement a mechanism to ensure safe administration of the medications.   DRIVING: Regarding driving, in patients with progressive memory problems, driving will be impaired. We advise to have someone else do the driving if trouble finding directions or if minor accidents are reported. Independent driving assessment is available to determine safety of driving.   If you are  interested in the driving assessment, you can contact the following:  [employer] in [place]  [place] [phone] or [phone]    [place] refers to food and lifestyle choices that are based on the traditions of countries located on the [employer]. This way of eating has been shown to help prevent certain conditions and improve outcomes for people who have chronic diseases, like kidney disease and heart disease. What are tips for following this plan? Lifestyle  Cook and eat meals together with your family, when possible. Drink enough fluid to keep your urine clear or pale yellow. Be physically active every day. This includes: Aerobic exercise like running or swimming. Leisure activities like gardening, walking, or housework. Get 7-8 hours of sleep each night. If recommended by your health care provider, drink red wine in moderation. This means 1 glass a day for nonpregnant women and 2 glasses a day for men. A glass of wine equals 5 oz (150 mL). Reading food labels  Check the serving size of packaged foods. For foods such as rice and pasta, the serving size refers to the amount of cooked product, not dry. Check the total fat in packaged foods. Avoid foods that have saturated fat or trans fats. Check the ingredients list for added sugars, such as  corn syrup. Shopping  At the grocery store, buy most of your food from the areas near the walls of the store. This includes: Fresh fruits and vegetables (produce). Grains, beans, nuts, and seeds. Some of these may be available in unpackaged forms or large amounts (in bulk). Fresh seafood. Poultry and eggs. Low-fat dairy products. Buy whole ingredients instead of prepackaged foods. Buy fresh fruits and vegetables in-season from local farmers markets. Buy frozen fruits and vegetables  in resealable bags. If you do not have access to quality fresh seafood, buy precooked frozen shrimp or canned fish, such as tuna, salmon, or sardines. Buy small amounts of raw or cooked vegetables, salads, or olives from the deli or salad bar at your store. Stock your pantry so you always have certain foods on hand, such as olive oil, canned tuna, canned tomatoes, rice, pasta, and beans. Cooking  Cook foods with extra-virgin olive oil instead of using butter or other vegetable oils. Have meat as a side dish, and have vegetables or grains as your main dish. This means having meat in small portions or adding small amounts of meat to foods like pasta or stew. Use beans or vegetables instead of meat in common dishes like chili or lasagna. Experiment with different cooking methods. Try roasting or broiling vegetables instead of steaming or sauteing them. Add frozen vegetables to soups, stews, pasta, or rice. Add nuts or seeds for added healthy fat at each meal. You can add these to yogurt, salads, or vegetable dishes. Marinate fish or vegetables using olive oil, lemon juice, garlic, and fresh herbs. Meal planning  Plan to eat 1 vegetarian meal one day each week. Try to work up to 2 vegetarian meals, if possible. Eat seafood 2 or more times a week. Have healthy snacks readily available, such as: Vegetable sticks with hummus. Greek yogurt. Fruit and nut trail mix. Eat balanced meals throughout the week. This includes: Fruit: 2-3 servings a day Vegetables: 4-5 servings a day Low-fat dairy: 2 servings a day Fish, poultry, or lean meat: 1 serving a day Beans and legumes: 2 or more servings a week Nuts and seeds: 1-2 servings a day Whole grains: 6-8 servings a day Extra-virgin olive oil: 3-4 servings a day Limit red meat and sweets to only a few servings a month What are my food choices? Mediterranean diet Recommended Grains: Whole-grain pasta. Brown rice. Bulgar wheat. Polenta. Couscous.  Whole-wheat bread. [place]. Vegetables: Artichokes. Beets. Broccoli. Cabbage. Carrots. Eggplant. Green beans. Chard. Kale. Spinach. Onions. Leeks. Peas. Squash. Tomatoes. Peppers. Radishes. Fruits: Apples. Apricots. Avocado. Berries. Bananas. Cherries. Dates. Figs. Grapes. Lemons. Melon. Oranges. Peaches. Plums. Pomegranate. Meats and other protein foods: Beans. Almonds. Sunflower seeds. Pine nuts. Peanuts. [place]. Salmon. Scallops. Shrimp. [place]. Tilapia. Clams. Oysters. Eggs. Dairy: Low-fat milk. Cheese. Greek yogurt. Beverages: Water. Red wine. Herbal tea. Fats and oils: Extra virgin olive oil. Avocado oil. Grape seed oil. Sweets and desserts: [place] yogurt with honey. Baked apples. Poached pears. Trail mix. Seasoning and other foods: Basil. Cilantro. Coriander. Cumin. Mint. Parsley. Sage. Rosemary. Tarragon. Garlic. Oregano. Thyme. Pepper. Balsalmic vinegar. Tahini. Hummus. Tomato sauce. Olives. Mushrooms. Limit these Grains: Prepackaged pasta or rice dishes. Prepackaged cereal with added sugar. Vegetables: Deep fried potatoes (french fries). Fruits: Fruit canned in syrup. Meats and other protein foods: Beef. Pork. Lamb. Poultry with skin. Hot dogs. [place] Salines. Dairy: Ice cream. Sour cream. Whole milk. Beverages: Juice. Sugar-sweetened soft drinks. Beer. Liquor and spirits. Fats and oils: Butter. Canola oil. Vegetable oil. Beef fat (tallow). Lard.  Sweets and desserts: Cookies. Cakes. Pies. Candy. Seasoning and other foods: Mayonnaise. [place] sauces and marinades. The items listed may not be a complete list. Talk with your dietitian about what dietary choices are right for you. Summary The Mediterranean diet includes both food and lifestyle choices. Eat a variety of fresh fruits and vegetables, beans, nuts, seeds, and whole grains. Limit the amount of red meat and sweets that you eat. Talk with your health care provider about whether it is safe for you to drink red wine in moderation. This  means 1 glass a day for nonpregnant women and 2 glasses a day for men. A glass of wine equals 5 oz (150 mL). This information is not intended to replace advice given to you by your health care provider. Make sure you discuss any questions you have with your health care provider. Document Released: 08/16/2015 Document Revised: 09/18/2015 Document Reviewed: 08/16/2015 Elsevier Interactive Patient Education  2017 [employer].

## 2021-09-25 ENCOUNTER — Ambulatory Visit (INDEPENDENT_AMBULATORY_CARE_PROVIDER_SITE_OTHER): Payer: Medicare HMO | Admitting: Family Medicine

## 2021-09-25 ENCOUNTER — Encounter: Payer: Self-pay | Admitting: Family Medicine

## 2021-09-25 VITALS — BP 122/70 | HR 78 | Temp 97.7°F | Ht 66.0 in | Wt 119.1 lb

## 2021-09-25 DIAGNOSIS — R69 Illness, unspecified: Secondary | ICD-10-CM | POA: Diagnosis not present

## 2021-09-25 DIAGNOSIS — F411 Generalized anxiety disorder: Secondary | ICD-10-CM

## 2021-09-25 DIAGNOSIS — F0150 Vascular dementia without behavioral disturbance: Secondary | ICD-10-CM

## 2021-09-25 DIAGNOSIS — F419 Anxiety disorder, unspecified: Secondary | ICD-10-CM | POA: Insufficient documentation

## 2021-09-25 DIAGNOSIS — G309 Alzheimer's disease, unspecified: Secondary | ICD-10-CM | POA: Diagnosis not present

## 2021-09-25 DIAGNOSIS — Z23 Encounter for immunization: Secondary | ICD-10-CM

## 2021-09-25 DIAGNOSIS — M810 Age-related osteoporosis without current pathological fracture: Secondary | ICD-10-CM

## 2021-09-25 DIAGNOSIS — E538 Deficiency of other specified B group vitamins: Secondary | ICD-10-CM

## 2021-09-25 DIAGNOSIS — F0280 Dementia in other diseases classified elsewhere without behavioral disturbance: Secondary | ICD-10-CM

## 2021-09-25 HISTORY — DX: Generalized anxiety disorder: F41.1

## 2021-09-25 MED ORDER — CYANOCOBALAMIN 1000 MCG/ML IJ SOLN
1000.0000 ug | Freq: Once | INTRAMUSCULAR | Status: AC
  Administered 2021-09-25: 1000 ug via INTRAMUSCULAR

## 2021-09-25 MED ORDER — SERTRALINE HCL 25 MG PO TABS: 25.0000 mg | ORAL_TABLET | Freq: Every day | ORAL | 11 refills | Status: DC

## 2021-09-25 MED ORDER — POTASSIUM 99 MG PO TABS: 1.0000 | ORAL_TABLET | Freq: Every day | ORAL | Status: DC

## 2021-09-25 MED ORDER — CYANOCOBALAMIN 500 MCG PO TABS: 500.0000 ug | ORAL_TABLET | Freq: Every day | ORAL | Status: AC

## 2021-09-25 NOTE — Assessment & Plan Note (Signed)
Latest Prolia shot 07/2021.

## 2021-09-25 NOTE — Assessment & Plan Note (Addendum)
Appreciate neurology care, records reviewed.  Found to have low B12-see below She is tolerating higher Exelon dose of 3 mg p.o. twice daily.  Continue this. Pending neuro cognitive evaluation-has upcoming appointment with Dr. Melvyn Novas in January. Noted ongoing anxiety-see below for plan. Noted ongoing possible visual hallucinations- reevaluate after upcoming cataract surgery.

## 2021-09-25 NOTE — Patient Instructions (Addendum)
Flu shot today B12 shot today then start 576mg b12 vitamin daily.  Trial sertraline '25mg'$  daily.  Good to see you today.  Return in 3 months for follow up visit.

## 2021-09-25 NOTE — Assessment & Plan Note (Signed)
Ongoing anxiety noted by son. We will start sertraline low-dose 25 mg daily.  Reviewed possible side effects to monitor for.  Reassess at follow-up visit in 3 months.

## 2021-09-25 NOTE — Assessment & Plan Note (Signed)
Markedly low on latest check at 147. She had not been taking oral replacement but has since restarted 500 mcg Monday Wednesday Friday. We will provide B12 injection today then recommend she start daily replacement.

## 2021-09-25 NOTE — Progress Notes (Signed)
Patient ID: Marilyn Cook, female    DOB: 05-26-1945, 76 y.o.   MRN: [id number]  This visit was conducted in person.  BP 122/70   Pulse 78   Temp 97.7 F (36.5 C) (Temporal)   Ht '5\' 6"'$  (1.676 m)   Wt 119 lb 2 oz (54 kg)   SpO2 100%   BMI 19.23 kg/m    CC: 4 mo f/u visit  Subjective:   HPI: Marilyn Cook is a 76 y.o. female presenting on 09/25/2021 for Follow-up (Here for 4 mo f/u.  Wants to discuss correct potassium dose. Pt accompanied by son, Dellis Filbert. )   Last visit diagnosed with MCI with memory loss, MMSE 23/30. Started on excelon patch (GI intolerance to oral aricept). This was unaffordable so we started excelon 1.'5mg'$  BID. This has not really helped anxiety as well.   Evaluated by [facility] neurology Clarise Cruz PA, note reviewed. 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 Excelon increased to '3mg'$  BID. Brain MRI showed chronic small vessel ischemic changes, moderate in cerebral white matter, as well as moderate generalized cerebral atrophy. Has been referred for neurocognitive evaluation.   Denies trouble with dizziness, imbalance, tremor.   Found to have low B12 - she had not been taking oral b12 557mg MWF.   Recent eye evaluation - found bilateral cataracts, planned cataract surgery in a few months.   OP - prolia injection received 08/01/2021.      Relevant past medical, surgical, family and social history reviewed and updated as indicated. Interim medical history since our last visit reviewed. Allergies and medications reviewed and updated. Outpatient Medications Prior to Visit  Medication Sig Dispense Refill   atorvastatin (LIPITOR) 40 MG tablet Take 1 tablet (40 mg total) by mouth daily. 90 tablet 3   Calcium Carb-Cholecalciferol (CALCIUM-VITAMIN D) 600-400 MG-UNIT TABS Take 1 tablet by mouth 2 (two) times daily.      cetirizine (ZYRTEC) 10 MG tablet Take 10 mg by mouth daily.     clopidogrel (PLAVIX) 75 MG tablet Take 1 tablet (75 mg total)  by mouth daily. 90 tablet 3   co-enzyme Q-10 30 MG capsule Take 30 mg by mouth daily.     hydrocortisone 2.5 % cream Apply 1 application  topically 2 (two) times daily as needed (itching).     levothyroxine (SYNTHROID) 25 MCG tablet Take 1 tablet (25 mcg total) by mouth daily before breakfast. 90 tablet 3   Pyridoxine HCl (VITAMIN B-6 PO) Take 1 tablet by mouth daily.     rivastigmine (EXELON) 3 MG capsule Take 1 capsule (3 mg total) by mouth 2 (two) times daily. 60 capsule 3   triamcinolone (KENALOG) 0.147 MG/GM topical spray Apply topically 2 (two) times daily as needed. For psoriasis 63 g 1   Potassium Chloride (K-TABS PO) Take 1 tablet by mouth daily.     vitamin B-12 (CYANOCOBALAMIN) 500 MCG tablet Take 1 tablet (500 mcg total) by mouth every Monday, Wednesday, and Friday.     rivastigmine (EXELON) 1.5 MG capsule TAKE 1 CAPSULE (1.5 MG TOTAL) BY MOUTH TWICE A DAY 60 capsule 3   No facility-administered medications prior to visit.     Per HPI unless specifically indicated in ROS section below Review of Systems  Objective:  BP 122/70   Pulse 78   Temp 97.7 F (36.5 C) (Temporal)   Ht '5\' 6"'$  (1.676 m)   Wt 119 lb 2 oz (54 kg)   SpO2 100%  BMI 19.23 kg/m   Wt Readings from Last 3 Encounters:  09/25/21 119 lb 2 oz (54 kg)  09/02/21 122 lb (55.3 kg)  08/02/21 122 lb (55.3 kg)      Physical Exam Vitals and nursing note reviewed.  Constitutional:      Appearance: Normal appearance. She is not ill-appearing.  HENT:     Mouth/Throat:     Mouth: Mucous membranes are moist.     Pharynx: Oropharynx is clear. No oropharyngeal exudate or posterior oropharyngeal erythema.  Eyes:     Extraocular Movements: Extraocular movements intact.     Pupils: Pupils are equal, round, and reactive to light.  Neck:     Thyroid: No thyroid mass or thyromegaly.  Cardiovascular:     Rate and Rhythm: Normal rate and regular rhythm.     Pulses: Normal pulses.     Heart sounds: Normal heart sounds.  No murmur heard. Pulmonary:     Effort: Pulmonary effort is normal. No respiratory distress.     Breath sounds: Normal breath sounds. No wheezing, rhonchi or rales.  Musculoskeletal:     Cervical back: Normal range of motion and neck supple.     Right lower leg: No edema.     Left lower leg: No edema.  Skin:    General: Skin is warm and dry.     Findings: No rash.  Neurological:     Mental Status: She is alert.  Psychiatric:        Mood and Affect: Mood normal.        Behavior: Behavior normal.       Results for orders placed or performed in visit on 08/02/21  TSH  Result Value Ref Range   TSH 2.13 0.35 - 5.50 uIU/mL  B12  Result Value Ref Range   Vitamin B-12 147 (L) 211 - 911 pg/mL   Lab Results  Component Value Date   CREATININE 0.74 06/19/2021   BUN 7 06/19/2021   NA 139 06/19/2021   K 3.4 (L) 06/19/2021   CL 102 06/19/2021   CO2 29 06/19/2021    Assessment & Plan:   Problem List Items Addressed This Visit     Osteoporosis    Latest Prolia shot 07/2021.       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  Found to have low B12-see below She is tolerating higher Exelon dose of 3 mg p.o. twice daily.  Continue this. Pending neuro cognitive evaluation-has upcoming appointment with Dr. Melvyn Novas in January. Noted ongoing anxiety-see below for plan. Noted ongoing possible visual hallucinations- reevaluate after upcoming cataract surgery.      Relevant Medications   sertraline (ZOLOFT) 25 MG tablet   Vitamin B12 deficiency    Markedly low on latest check at 147. She had not been taking oral replacement but has since restarted 500 mcg Monday Wednesday Friday. We will provide B12 injection today then recommend she start daily replacement.      Anxiety    Ongoing anxiety noted by son. We will start sertraline low-dose 25 mg daily.  Reviewed possible side effects to monitor for.  Reassess at follow-up visit in  3 months.      Relevant Medications   sertraline (ZOLOFT) 25 MG tablet   Other Visit Diagnoses     Need for influenza vaccination       Relevant Orders   Flu Vaccine QUAD High Dose(Fluad) (Completed)  Meds ordered this encounter  Medications   Potassium 99 MG TABS    Sig: Take 1 tablet (99 mg total) by mouth daily.   sertraline (ZOLOFT) 25 MG tablet    Sig: Take 1 tablet (25 mg total) by mouth daily.    Dispense:  30 tablet    Refill:  11   cyanocobalamin (VITAMIN B12) 500 MCG tablet    Sig: Take 1 tablet (500 mcg total) by mouth daily.   cyanocobalamin (VITAMIN B12) injection 1,000 mcg   Orders Placed This Encounter  Procedures   Flu Vaccine QUAD High Dose(Fluad)     Patient Instructions  Flu shot today B12 shot today then start 557mg b12 vitamin daily.  Trial sertraline '25mg'$  daily.  Good to see you today.  Return in 3 months for follow up visit.   Follow up plan: Return in about 3 months (around 12/25/2021) for follow up visit.  JRia Bush MD

## 2021-10-08 ENCOUNTER — Encounter: Payer: Self-pay | Admitting: Family Medicine

## 2021-10-08 ENCOUNTER — Ambulatory Visit: Payer: Medicare HMO | Admitting: Family Medicine

## 2021-10-08 ENCOUNTER — Ambulatory Visit (INDEPENDENT_AMBULATORY_CARE_PROVIDER_SITE_OTHER): Payer: Medicare HMO | Admitting: Family Medicine

## 2021-10-08 VITALS — BP 126/74 | HR 90 | Temp 97.6°F | Ht 66.0 in | Wt 114.4 lb

## 2021-10-08 DIAGNOSIS — R109 Unspecified abdominal pain: Secondary | ICD-10-CM | POA: Insufficient documentation

## 2021-10-08 DIAGNOSIS — N3000 Acute cystitis without hematuria: Secondary | ICD-10-CM

## 2021-10-08 DIAGNOSIS — R1030 Lower abdominal pain, unspecified: Secondary | ICD-10-CM | POA: Diagnosis not present

## 2021-10-08 HISTORY — DX: Acute cystitis without hematuria: N30.00

## 2021-10-08 HISTORY — DX: Unspecified abdominal pain: R10.9

## 2021-10-08 LAB — POC URINALSYSI DIPSTICK (AUTOMATED)
Bilirubin, UA: 2
Blood, UA: 80
Glucose, UA: NEGATIVE
Ketones, UA: 5
Nitrite, UA: NEGATIVE
Protein, UA: POSITIVE — AB
Spec Grav, UA: >=1.03 — AB (ref 1.010–1.025)
Urobilinogen, UA: 4 E.U./dL — AB
pH, UA: 6 (ref 5.0–8.0)

## 2021-10-08 MED ORDER — NITROFURANTOIN MONOHYD MACRO 100 MG PO CAPS: 100.0000 mg | ORAL_CAPSULE | Freq: Two times a day (BID) | ORAL | 0 refills | Status: DC

## 2021-10-08 NOTE — Assessment & Plan Note (Signed)
Low abd discomfort for 1 wk with mental cloudiness, decreased appetite but no classic urinary symptoms  ua is pos and concentrated today  Px macrobid (? All to sulfa and pcn) last gfr in nl range  Enc more water intake (less soda)  Disc s/s of pyelo to watch for  Urine cx pending  Will change tx if needed  Handout given

## 2021-10-08 NOTE — Progress Notes (Signed)
Subjective:    Patient ID: Marilyn Cook, female    DOB: [birth year], 76 y.o.   MRN: [id number]  HPI 76 yo pt of Dr Darnell Level presents with abdominal pain  She has a h/o alz dementia and B12 def and renal insuff Has had a hysterectomy   Wt Readings from Last 3 Encounters:  10/08/21 114 lb 6 oz (51.9 kg)  09/25/21 119 lb 2 oz (54 kg)  09/02/21 122 lb (55.3 kg)   18.46 kg/m  Wt is down 5 lb  Problems with wt loss in the past     Was sitting and her low abdomen felt weird  Then it kind of moved to different areas  Was pain at some times- cannot describe  Perhaps achy and at times more sharp Felt better to lie down one day / was worse when she got up  No n/v  ? If cramping  No bowel changes   Urination- no freq or urgency  No dysuria  Pushing some water  She does drink Dr pepper -and had more of it lately  No blood in urine No flank pain   No h/o kidney stone   Mental status : caregiver notes she was more mentally cloudy yesterday   Today feels better than she has   Son manages her medicines  Added zoloft a few wk ago for anxiety  Held it Friday- made no difference   Exelon for dementia- when the dose was increased this pain seemed to occur  Held it for a few days-no difference   Is back on everything     Takes plavix No nsaids  Zoloft for mood   UA today is positive  Results for orders placed or performed in visit on 10/08/21  POCT Urinalysis Dipstick (Automated)  Result Value Ref Range   Color, UA Dark Yellow    Clarity, UA Hazy    Glucose, UA Negative Negative   Bilirubin, UA 2 mg/dL    Ketones, UA 5 mg/dL    Spec Grav, UA >=1.030 (A) 1.010 - 1.025   Blood, UA 80 Ery/uL    pH, UA 6.0 5.0 - 8.0   Protein, UA Positive (A) Negative   Urobilinogen, UA 4.0 (A) 0.2 or 1.0 E.U./dL   Nitrite, UA Negative    Leukocytes, UA Small (1+) (A) Negative   Lab Results  Component Value Date   CREATININE 0.74 06/19/2021   BUN 7 06/19/2021   NA 139 06/19/2021    K 3.4 (L) 06/19/2021   CL 102 06/19/2021   CO2 29 06/19/2021      Last colonoscopy 2017   diverticulosis and hemorrhoids She has no h/o diverticulitis  Had polyp on more remote colonosc Her sister had both ovarian and colon cancer   Last CT abd/pelvis 03/2020 IMPRESSION: 1. No acute abnormality. No evidence of bowel obstruction or acute bowel inflammation. Moderate left colonic diverticulosis, with no evidence of acute diverticulitis. 2. Small to moderate hiatal hernia. Oral contrast in the lower thoracic esophagus, suggesting esophageal dysmotility and/or gastroesophageal reflux. 3. Hypodense exophytic 2.1 cm upper right renal cortical lesion, indeterminate. Recommend further characterization with MRI (preferred) or CT abdomen without and with IV contrast. 4. Aortic Atherosclerosis (ICD10-I70.0).  Echo dense liver on [place] in 2007  Lab Results  Component Value Date   WBC 4.0 05/24/2021   HGB 13.8 05/24/2021   HCT 39.6 05/24/2021   MCV 94.8 05/24/2021   PLT 219.0 05/24/2021   Lab Results  Component Value Date  CREATININE 0.74 06/19/2021   BUN 7 06/19/2021   NA 139 06/19/2021   K 3.4 (L) 06/19/2021   CL 102 06/19/2021   CO2 29 06/19/2021   Lab Results  Component Value Date   ALT 17 05/24/2021   AST 20 05/24/2021   ALKPHOS 48 05/24/2021   BILITOT 0.6 05/24/2021   Patient Active Problem List   Diagnosis Date Noted   Acute cystitis 10/08/2021   Anxiety 09/25/2021   Vitamin B12 deficiency 08/05/2021   Right-sided low back pain without sciatica 07/13/2021   Pulsatile tinnitus of right ear 05/21/2021   Transient alteration of awareness 08/16/2020   Orthostatic lightheadedness 08/16/2020   Atherosclerosis of aorta ([place]) 03/18/2020   Hiatal hernia 03/18/2020   Lesion of right native kidney 03/18/2020   Compression fracture of thoracic vertebra ([place]) 02/25/2020   Abnormal urinalysis 02/24/2020   Renal insufficiency 10/18/2019   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   Osteoporosis 02/17/2019   Welcome to Medicare preventive visit 02/05/2018   Advanced care planning/counseling discussion 01/23/2015   Chronic right shoulder pain 10/14/2014   TIA (transient ischemic attack) 06/08/2014   Unintentional weight loss 04/13/2014   Palpitations 03/28/2013   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   Scalp psoriasis    Healthcare maintenance 07/31/2011   Sleep disturbance 04/14/2011   DIVERTICULOSIS OF COLON 10/27/2007   HLD (hyperlipidemia) 06/30/2006   ALLERGY, ENVIRONMENTAL 06/30/2006   Past Medical History:  Diagnosis Date   Benign tumor of breast 9485   Complication of anesthesia    hard to wake up   Hyperlipemia    NSVD (normal spontaneous vaginal delivery)    x 1   Osteopenia 08/2012   spine WNL, hip T -1.2   Scalp psoriasis    Stroke ([place])    possible TIA in 06/2014   TIA (transient ischemic attack) 06/08/2014   Past Surgical History:  Procedure Laterality Date   ABDOMINAL HYSTERECTOMY     COLONOSCOPY  04/2015   diverticulosis, int hem, rpt 10 yrs (Nandigam)   DEXA  08/2012   spine WNL, hip T -1.2   PARTIAL HYSTERECTOMY  1987   due to fibroids, ovaries remain   R Lat Epicondyle Injections  1998   SHOULDER ARTHROSCOPY Right 11/2014   arthroscopic debridement and mini open rotator cuff repair of R shoulder for chronic impingement and rotator cuff tear by MRI [facility])   Spiral CT  04/02   (-)   Social History   Tobacco Use   Smoking status: Never   Smokeless tobacco: Never  Vaping Use   Vaping Use: Never used  Substance Use Topics   Alcohol use: No    Alcohol/week: 0.0 standard drinks of alcohol   Drug use: No   Family History  Problem Relation Age of Onset   Heart disease Father        MI    Stroke Father        CVA   Stroke Mother        CVA   Hypertension Sister    Cancer Sister        ovarian and colon cancer   Aneurysm Brother    Hypertension Brother    Hypertension  Brother    Cancer Paternal Aunt        ? colon   Cancer Paternal Grandfather        liver, alcohol   Alcohol abuse Paternal Grandfather    Cancer Other  prostate   Cancer Maternal Aunt        breast   Allergies  Allergen Reactions   Aspirin     Unknown reaction   Diphenhydramine Hcl    Donepezil Other (See Comments)    GI upset   Eggs Or Egg-Derived Products Hives and Swelling   Epinephrine     REACTION: UNSPECIFIED   Latex     REACTION: swelling   Other Other (See Comments)    GI upset, headache to DAIRY PRODUCTS   Penicillins    Sulfasalazine    Current Outpatient Medications on File Prior to Visit  Medication Sig Dispense Refill   atorvastatin (LIPITOR) 40 MG tablet Take 1 tablet (40 mg total) by mouth daily. 90 tablet 3   Calcium Carb-Cholecalciferol (CALCIUM-VITAMIN D) 600-400 MG-UNIT TABS Take 1 tablet by mouth 2 (two) times daily.      cetirizine (ZYRTEC) 10 MG tablet Take 10 mg by mouth daily.     clopidogrel (PLAVIX) 75 MG tablet Take 1 tablet (75 mg total) by mouth daily. 90 tablet 3   co-enzyme Q-10 30 MG capsule Take 30 mg by mouth daily.     cyanocobalamin (VITAMIN B12) 500 MCG tablet Take 1 tablet (500 mcg total) by mouth daily.     hydrocortisone 2.5 % cream Apply 1 application  topically 2 (two) times daily as needed (itching).     levothyroxine (SYNTHROID) 25 MCG tablet Take 1 tablet (25 mcg total) by mouth daily before breakfast. 90 tablet 3   Potassium 99 MG TABS Take 1 tablet (99 mg total) by mouth daily.     Pyridoxine HCl (VITAMIN B-6 PO) Take 1 tablet by mouth daily.     rivastigmine (EXELON) 3 MG capsule Take 1 capsule (3 mg total) by mouth 2 (two) times daily. 60 capsule 3   sertraline (ZOLOFT) 25 MG tablet Take 1 tablet (25 mg total) by mouth daily. 30 tablet 11   triamcinolone (KENALOG) 0.147 MG/GM topical spray Apply topically 2 (two) times daily as needed. For psoriasis 63 g 1   No current facility-administered medications on file prior  to visit.    Review of Systems  Constitutional:  Negative for activity change, appetite change, fatigue, fever and unexpected weight change.  HENT:  Negative for congestion, ear pain, rhinorrhea, sinus pressure and sore throat.   Eyes:  Negative for pain, redness and visual disturbance.  Respiratory:  Negative for cough, shortness of breath and wheezing.   Cardiovascular:  Negative for chest pain and palpitations.  Gastrointestinal:  Positive for abdominal pain. Negative for blood in stool, constipation and diarrhea.       Low abd pain  Endocrine: Negative for polydipsia and polyuria.  Genitourinary:  Negative for dysuria, frequency and urgency.  Musculoskeletal:  Negative for arthralgias, back pain and myalgias.  Skin:  Negative for pallor and rash.  Allergic/Immunologic: Negative for environmental allergies.  Neurological:  Negative for dizziness, syncope and headaches.  Hematological:  Negative for adenopathy. Does not bruise/bleed easily.  Psychiatric/Behavioral:  Negative for decreased concentration and dysphoric mood. The patient is not nervous/anxious.        Objective:   Physical Exam Constitutional:      General: She is not in acute distress.    Appearance: Normal appearance. She is well-developed. She is not ill-appearing or diaphoretic.     Comments: Under weight   HENT:     Head: Normocephalic and atraumatic.     Mouth/Throat:     Mouth: Mucous membranes  are moist.  Eyes:     Conjunctiva/sclera: Conjunctivae normal.     Pupils: Pupils are equal, round, and reactive to light.  Cardiovascular:     Rate and Rhythm: Normal rate and regular rhythm.     Heart sounds: Normal heart sounds.  Pulmonary:     Effort: Pulmonary effort is normal.     Breath sounds: Normal breath sounds.  Abdominal:     General: Bowel sounds are normal. There is no distension.     Palpations: Abdomen is soft.     Tenderness: There is abdominal tenderness in the suprapubic area. There is no  rebound.     Comments: No cva tenderness  Mild suprapubic tenderness Bladder does not feel distended  Musculoskeletal:     Cervical back: Normal range of motion and neck supple.  Lymphadenopathy:     Cervical: No cervical adenopathy.  Skin:    General: Skin is warm and dry.     Coloration: Skin is not pale.     Findings: No rash.  Neurological:     Mental Status: She is alert.  Psychiatric:        Mood and Affect: Mood normal.           Assessment & Plan:   Problem List Items Addressed This Visit       Genitourinary   Acute cystitis - Primary    Low abd discomfort for 1 wk with mental cloudiness, decreased appetite but no classic urinary symptoms  ua is pos and concentrated today  Px macrobid (? All to sulfa and pcn) last gfr in nl range  Enc more water intake (less soda)  Disc s/s of pyelo to watch for  Urine cx pending  Will change tx if needed  Handout given        Relevant Orders   Urine Culture     Other   Lower abdominal pain    Reviewed pt's prior GI/ abd history incl GI notes, colonsoc report, last CT and [place]   ua is positive  Some ms change as well  Suspect uti is cause Px macrobid  Enc fluids cx pending/re eval if worse or not imp Update if not starting to improve in a week or if worsening        Relevant Orders   POCT Urinalysis Dipstick (Automated) (Completed)

## 2021-10-08 NOTE — Patient Instructions (Signed)
Drink water Take the generic macrobid as directed   If symptoms suddenly worsen - let us know   We will call when the urine culture returns

## 2021-10-08 NOTE — Assessment & Plan Note (Signed)
Reviewed pt's prior GI/ abd history incl GI notes, colonsoc report, last CT and [place]   ua is positive  Some ms change as well  Suspect uti is cause Px macrobid  Enc fluids cx pending/re eval if worse or not imp Update if not starting to improve in a week or if worsening

## 2021-10-09 LAB — URINE CULTURE
MICRO NUMBER:: 14000829
SPECIMEN QUALITY:: ADEQUATE

## 2021-10-14 ENCOUNTER — Ambulatory Visit: Payer: Medicare HMO | Admitting: Family Medicine

## 2021-10-15 ENCOUNTER — Ambulatory Visit (INDEPENDENT_AMBULATORY_CARE_PROVIDER_SITE_OTHER): Payer: Medicare HMO | Admitting: Family Medicine

## 2021-10-15 ENCOUNTER — Encounter: Payer: Self-pay | Admitting: Family Medicine

## 2021-10-15 DIAGNOSIS — R109 Unspecified abdominal pain: Secondary | ICD-10-CM | POA: Diagnosis not present

## 2021-10-15 LAB — POC URINALSYSI DIPSTICK (AUTOMATED)
Bilirubin, UA: NEGATIVE
Glucose, UA: NEGATIVE
Ketones, UA: NEGATIVE
Nitrite, UA: NEGATIVE
Protein, UA: NEGATIVE
Spec Grav, UA: 1.01 (ref 1.010–1.025)
Urobilinogen, UA: 0.2 E.U./dL
pH, UA: 8 (ref 5.0–8.0)

## 2021-10-15 NOTE — Patient Instructions (Signed)
Urinalysis today  I am suspicious for side effect of a medicine contributing to symptoms.  No changes for now, give this one more week and if ongoing discomfort then stop sertraline mood medicine. If ongoing past that, drop excelon to once daily and update [place] with effect.

## 2021-10-15 NOTE — Assessment & Plan Note (Addendum)
Previous records reviewed.  Overall improving after macrobid course, however UCx did not grow obvious bacterial infection. Suspect med-related side effect of GI upset - SSRI or acetylcholinesterase inhibitor. Update UA today, advised if ongoing symptoms, hold SSRI and if ongoing symptoms drop Excelon to once daily, update with effect.  Rpt UA today again abnormal but microscopy largely reassuring. UCx sent.

## 2021-10-15 NOTE — Progress Notes (Signed)
Patient ID: [name], female    DOB: 1946/01/01, 76 y.o.   MRN: [id number]  This visit was conducted in person.  BP 118/74 (BP Location: Right Arm, Patient Position: Sitting, Cuff Size: Normal)   Pulse 80   Temp (!) 97.3 F (36.3 C)   Ht '5\' 6"'$  (1.676 m)   Wt 114 lb (51.7 kg)   SpO2 94%   BMI 18.40 kg/m    CC: lower abd pain Subjective:   HPI: Marilyn Cook is a 76 y.o. female presenting on 10/15/2021 for Stomach Issues (Here with son, [name]. Saw Dr Glori Bickers 10-08-21. No dysuria with the UTI. Still having the lower abdomen issues. Not as bad as it was.)   Recent diagnosis by neurology of mild mixed dementia (vascular, alz). Excelon pills increased to '3mg'$  BID (08/2021). Sertraline '25mg'$  daily was started for anxiety.  She also was found to have low B12 levels, started on oral daily replacement and received vitamin B12 injection x1 09/25/2021.   Saw Dr Glori Bickers last week with abdominal discomfort and possible worsening mental cloudiness, UA at that time was hazy, concentrated, positive for LE and blood. Treated for UTI with macrobid 5d course. UCx grew mixed genital flora.   She notes symptoms are overall better after antibiotic course, but she notes ongoing difficulty with abdominal discomfort. Initially it occurred in suprapubic area, but now can come on both sides. She describes "rolling sensation" of discomfort.   No fevers/chills, dysuria, frequency or urgency, nausea/vomiting, bowel changes. Appetite has been ok.   Symptoms seemed to have started when sertraline was started.      Relevant past medical, surgical, family and social history reviewed and updated as indicated. Interim medical history since our last visit reviewed. Allergies and medications reviewed and updated. Outpatient Medications Prior to Visit  Medication Sig Dispense Refill   atorvastatin (LIPITOR) 40 MG tablet Take 1 tablet (40 mg total) by mouth daily. 90 tablet 3   Calcium Carb-Cholecalciferol  (CALCIUM-VITAMIN D) 600-400 MG-UNIT TABS Take 1 tablet by mouth 2 (two) times daily.      cetirizine (ZYRTEC) 10 MG tablet Take 10 mg by mouth daily.     clopidogrel (PLAVIX) 75 MG tablet Take 1 tablet (75 mg total) by mouth daily. 90 tablet 3   co-enzyme Q-10 30 MG capsule Take 30 mg by mouth daily.     cyanocobalamin (VITAMIN B12) 500 MCG tablet Take 1 tablet (500 mcg total) by mouth daily.     hydrocortisone 2.5 % cream Apply 1 application  topically 2 (two) times daily as needed (itching).     levothyroxine (SYNTHROID) 25 MCG tablet Take 1 tablet (25 mcg total) by mouth daily before breakfast. 90 tablet 3   Potassium 99 MG TABS Take 1 tablet (99 mg total) by mouth daily.     Pyridoxine HCl (VITAMIN B-6 PO) Take 1 tablet by mouth daily.     rivastigmine (EXELON) 3 MG capsule Take 1 capsule (3 mg total) by mouth 2 (two) times daily. 60 capsule 3   sertraline (ZOLOFT) 25 MG tablet Take 1 tablet (25 mg total) by mouth daily. 30 tablet 11   triamcinolone (KENALOG) 0.147 MG/GM topical spray Apply topically 2 (two) times daily as needed. For psoriasis 63 g 1   nitrofurantoin, macrocrystal-monohydrate, (MACROBID) 100 MG capsule Take 1 capsule (100 mg total) by mouth 2 (two) times daily. 10 capsule 0   No facility-administered medications prior to visit.     Per HPI unless specifically  indicated in ROS section below Review of Systems  Objective:  BP 118/74 (BP Location: Right Arm, Patient Position: Sitting, Cuff Size: Normal)   Pulse 80   Temp (!) 97.3 F (36.3 C)   Ht '5\' 6"'$  (1.676 m)   Wt 114 lb (51.7 kg)   SpO2 94%   BMI 18.40 kg/m   Wt Readings from Last 3 Encounters:  10/15/21 114 lb (51.7 kg)  10/08/21 114 lb 6 oz (51.9 kg)  09/25/21 119 lb 2 oz (54 kg)      Physical Exam Vitals and nursing note reviewed.  Constitutional:      Appearance: Normal appearance. She is not ill-appearing.  Cardiovascular:     Rate and Rhythm: Normal rate and regular rhythm.     Pulses: Normal  pulses.     Heart sounds: Normal heart sounds. No murmur heard. Pulmonary:     Effort: Pulmonary effort is normal. No respiratory distress.     Breath sounds: Normal breath sounds. No wheezing, rhonchi or rales.  Abdominal:     General: Bowel sounds are normal. There is no distension.     Palpations: Abdomen is soft. There is no mass.     Tenderness: There is no abdominal tenderness. There is no guarding or rebound.     Hernia: No hernia is present.  Musculoskeletal:     Right lower leg: No edema.     Left lower leg: No edema.  Skin:    General: Skin is warm and dry.     Findings: No rash.  Neurological:     Mental Status: She is alert.  Psychiatric:        Mood and Affect: Mood normal.        Behavior: Behavior normal.       Results for orders placed or performed in visit on 10/15/21  POCT Urinalysis Dipstick (Automated)  Result Value Ref Range   Color, UA Yellow    Clarity, UA slightly cloudy    Glucose, UA Negative Negative   Bilirubin, UA negative    Ketones, UA negative    Spec Grav, UA 1.010 1.010 - 1.025   Blood, UA trace    pH, UA 8.0 5.0 - 8.0   Protein, UA Negative Negative   Urobilinogen, UA 0.2 0.2 or 1.0 E.U./dL   Nitrite, UA negative    Leukocytes, UA Moderate (2+) (A) Negative    Assessment & Plan:   Problem List Items Addressed This Visit     Abdominal discomfort    Previous records reviewed.  Overall improving after macrobid course, however UCx did not grow obvious bacterial infection. Suspect med-related side effect of GI upset - SSRI or acetylcholinesterase inhibitor. Update UA today, advised if ongoing symptoms, hold SSRI and if ongoing symptoms drop Excelon to once daily, update with effect.  Rpt UA today again abnormal but microscopy largely reassuring. UCx sent.       Relevant Orders   POCT Urinalysis Dipstick (Automated) (Completed)   Urine Culture     No orders of the defined types were placed in this encounter.  Orders Placed This  Encounter  Procedures   Urine Culture   POCT Urinalysis Dipstick (Automated)     Patient Instructions  Urinalysis today  I am suspicious for side effect of a medicine contributing to symptoms.  No changes for now, give this one more week and if ongoing discomfort then stop sertraline mood medicine. If ongoing past that, drop excelon to once daily and update [place] with  effect.   Follow up plan: Return if symptoms worsen or fail to improve.  Ria Bush, MD

## 2021-10-16 LAB — URINE CULTURE
MICRO NUMBER:: 14031521
SPECIMEN QUALITY:: ADEQUATE

## 2021-10-17 ENCOUNTER — Other Ambulatory Visit: Payer: Self-pay | Admitting: Family Medicine

## 2021-10-17 NOTE — Telephone Encounter (Signed)
Message from pharmacy:  REQUEST FOR 90 DAYS PRESCRIPTION. 

## 2021-11-06 HISTORY — PX: CATARACT EXTRACTION: SUR2

## 2021-11-18 DIAGNOSIS — Z961 Presence of intraocular lens: Secondary | ICD-10-CM | POA: Diagnosis not present

## 2021-11-18 DIAGNOSIS — H2511 Age-related nuclear cataract, right eye: Secondary | ICD-10-CM | POA: Diagnosis not present

## 2021-11-19 DIAGNOSIS — H2512 Age-related nuclear cataract, left eye: Secondary | ICD-10-CM | POA: Diagnosis not present

## 2021-12-02 DIAGNOSIS — H2512 Age-related nuclear cataract, left eye: Secondary | ICD-10-CM | POA: Diagnosis not present

## 2021-12-02 DIAGNOSIS — Z961 Presence of intraocular lens: Secondary | ICD-10-CM | POA: Diagnosis not present

## 2021-12-11 ENCOUNTER — Telehealth: Payer: Self-pay

## 2021-12-11 NOTE — Telephone Encounter (Signed)
Prolia VOB initiated via [email or web address]   Last Prolia inj: 08/01/21 Next Prolia inj DUE: 02/02/22  PA approved 06/18/21-06/19/22

## 2021-12-15 ENCOUNTER — Other Ambulatory Visit: Payer: Self-pay | Admitting: Physician Assistant

## 2021-12-19 ENCOUNTER — Ambulatory Visit: Payer: Medicare HMO | Admitting: Psychology

## 2021-12-19 ENCOUNTER — Ambulatory Visit: Payer: Medicare HMO

## 2021-12-19 ENCOUNTER — Encounter: Payer: Self-pay | Admitting: Psychology

## 2021-12-19 DIAGNOSIS — I679 Cerebrovascular disease, unspecified: Secondary | ICD-10-CM | POA: Insufficient documentation

## 2021-12-19 DIAGNOSIS — F0280 Dementia in other diseases classified elsewhere without behavioral disturbance: Secondary | ICD-10-CM | POA: Insufficient documentation

## 2021-12-19 DIAGNOSIS — R4189 Other symptoms and signs involving cognitive functions and awareness: Secondary | ICD-10-CM

## 2021-12-19 DIAGNOSIS — G309 Alzheimer's disease, unspecified: Secondary | ICD-10-CM | POA: Diagnosis not present

## 2021-12-19 HISTORY — DX: Dementia in other diseases classified elsewhere, unspecified severity, without behavioral disturbance, psychotic disturbance, mood disturbance, and anxiety: F02.80

## 2021-12-19 NOTE — Progress Notes (Signed)
NEUROPSYCHOLOGICAL EVALUATION [name] [place] Department of Neurology  Date of Evaluation: December 19, 2021  Reason for Referral:   Marilyn Cook is a 76 y.o. right-handed Caucasian female referred by Sharene Butters, PA-C, to characterize her current cognitive functioning and assist with diagnostic clarity and treatment planning in the context of subjective cognitive decline and concerns for an underlying neurodegenerative illness.   Assessment and Plan:   Clinical Impression(s): Ms. Reach pattern of performance is suggestive of impairment surrounding confrontation naming, semantic fluency, and essentially all aspects of learning and memory. Additional weaknesses were noted across processing speed and cognitive flexibility. Some variability was exhibited across visuospatial abilities, while performances were appropriate relative to age-matched peers across basic attention, receptive language, and phonemic fluency. Regarding day-to-day functioning, her son provides assistance with medication management and noted that he may need to provide reminders for her to change her clothes or shower from time to time. Medical records also suggest some prior issues with bill payment. Given evidence for cognitive and functional impairment, I believe she best meets criteria for a Major Neurocognitive Disorder ("dementia"). However, based upon her overall functioning, she would appear to be towards the milder end of this spectrum currently.  Regarding etiology, I do have concerns surrounding underlying Alzheimer's disease. Across memory testing, Marilyn Cook did not benefit from repeated exposure to information, was fully amnestic (i.e., 0% retention) across all three memory tasks, and performed very poorly across 2/3 recognition trials. She was also noted to repeat the same stories to the psychometrist throughout testing without any apparent appreciation that this information had  already been provided several times before. Taken together, this suggests rapid forgetting and an evolving and already quite notable memory storage impairment, both of which are the hallmark characteristics of this illness. Further deficits in confrontation naming and semantic fluency align with the expected progression of this illness as they represent the most common areas to be impacted following short-term memory.  There could be a vascular contribution to her presentation given neuroimaging suggesting moderate microvascular ischemic disease said to have somewhat progressed over the past several years. However, my suspicion is that this contribution is mild and only serves to worsen an underlying neurodegenerative illness. Hallucinations can be a concerning symptom of Lewy body disease. However, she does not display any other behavioral features of this illness and testing patterns align far more strongly with Alzheimer's disease, making this illness less likely. Hallucinations are not uncommon in Alzheimer's disease. She does not display behavioral features of frontotemporal lobar degeneration or other more rare parkinsonian conditions. Continued medical monitoring will be important moving forward.   Recommendations: Marilyn Cook has already been prescribed a medication aimed to address memory loss and concerns surrounding Alzheimer's disease (i.e., rivastigmine/Exelon). She is encouraged to continue taking this medication as prescribed. It is important to highlight that this medication has been shown to slow functional decline in some individuals. There is no current treatment which can stop or reverse cognitive decline when caused by a neurodegenerative illness.   Medications can be used to help treat visual hallucinations, especially if these become distressing and create paranoia. She should discuss medications with Marilyn Cook or her PCP.   Performance across neurocognitive testing is not a strong  predictor of an individual's safety operating a motor vehicle. Should her family wish to pursue a formalized driving evaluation, they could reach out to the following agencies: [employer] in [place]: [phone] Driver Rehabilitative Services: [facility]: [place]: [phone] or [phone]  Should there be further progression of current deficits over time, she is unlikely to regain any independent living skills lost. Therefore, it is recommended that she remain as involved as possible in all aspects of household chores, finances, and medication management, with supervision to ensure adequate performance. She will likely benefit from the establishment and maintenance of a routine in order to maximize functional abilities over time.  It will be important for Ms. Ace to have another person with her when in situations where she may need to process information, weigh the pros and cons of different options, and make decisions, in order to ensure that she fully understands and recalls all information to be considered.  Marilyn Cook is encouraged to attend to lifestyle factors for brain health (e.g., regular physical exercise, good nutrition habits, regular participation in cognitively-stimulating activities, and general stress management techniques), which are likely to have benefits for both emotional adjustment and cognition. Optimal control of vascular risk factors (including safe cardiovascular exercise and adherence to dietary recommendations) is encouraged. Continued participation in activities which provide mental stimulation and social interaction is also recommended.   Important information should be provided to Marilyn Cook in written format in all instances. This information should be placed in a highly frequented and easily visible location within her home to promote recall. External strategies such as written notes in a  consistently used memory journal, visual and nonverbal auditory cues such as a calendar on the refrigerator or appointments with alarm, such as on a cell phone, can also help maximize recall.  Review of Records:   [name] was seen by [facility] Metta Clines, D.O.) on 07/05/2014 for numbness. Briefly, about a month prior, she developed sudden onset numbness of her lips which spread to include the left side of her face. This subsequently spread to the palmar aspect of her left hand and she then developed a stinging pain in her left thumb. Symptoms lasted at least an hour but she still had residual symptoms involving the thumb afterwards. The next day, she still noticed the stinging sensation in her thumb and saw her PCP. A brain MRI on 06/21/2014 revealed premature cerebral and cerebellar atrophy, as well as mild to moderate chronic small vessel disease. It was negative for an acute or subacute stroke. On 06/26/2014, she developed a pressure-like sensation in her head, followed by numbness and tingling in the left leg from the knee down to the ankle. This lasted only several minutes and resolved. She was subsequently evaluated at the ED. She had another episode, this time involving the left arm, lasting only a few minutes. Labs were unremarkable except for mildly elevated glucose of 107. She was subsequently sent home and ultimately diagnosed with a transient ischemic attack (TIA).   She followed up with Dr. Tomi Likens on 10/19/2014. In the interim, she underwent a TIA workup. Carotid doppler showed 1-39% bilateral ICA stenosis. 2D echo with bubble study showed LVEF of 55-60% with no PFO or atrial septal defect. LDL was 82 and Hgb A1c was 5.9. Due to an aspirin allergy, she was instead started on Plavix. She also had seen [facility] with an unremarkable exam. She followed up with Dr. Tomi Likens for the final time on 02/02/2015, describing no recurrent spells.  She was next seen by [facility]  Sharene Butters, PA-C) on 08/02/2021 for an evaluation of memory loss. At that time, Ms. Polizzi initially denied memory concerns. Her son did highlight his mother  telling repetitive stories or asking repetitive questions. He also noted an instance where she had trouble recalling the 4-digit code to turn off her home alarm system. Functionally, she lives alone with her son next door. While her son provided some assistance with medication organization, she denied trouble with bill paying or driving. Some concerns were expressed surrounding potential hallucinations. At that time, her son wondered if these represented hallucinations or distorted images due to glaucoma. Performance on a brief cognitive screening instrument (MOCA) was 15/30. It was recommended that she increase rivastigmine to '3mg'$  BID. Ultimately, Ms. Valido was referred for a comprehensive neuropsychological evaluation to characterize her cognitive abilities and to assist with diagnostic clarity and treatment planning.   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 Vascular changes were said to have progressed since her 2016 scan.   Past Medical History:  Diagnosis Date   Abdominal discomfort 10/08/2021   Abnormal urinalysis 02/24/2020   Acute cystitis 10/08/2021   Atherosclerosis of aorta 03/18/2020   By CT 03/2020 and MRI 04/2020   Benign tumor of breast 01/06/1973   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   ?hypothyroid - start levothyroxine 69mg daily   Chronic right shoulder pain 120/94/7096  Complication of anesthesia    hard to wake up   Compression fracture of thoracic vertebra 02/25/2020   Incidental finding on CXR 02/2020 - T10 compression fracture    Diverticulosis of colon 10/27/2007   Generalized anxiety disorder 09/25/2021   Hiatal hernia 03/18/2020    Moderate sized with evidence esoph dysmotility or GERD on CT 03/2020   HLD (hyperlipidemia) 06/30/2006   New goal LDL <70 (1//2017)   Hyperlipemia    Lesion of right native kidney 03/18/2020   On CT 03/2020  MRI abd 04/2020 - R exophytic cyst - benign complex Bosniak 2 cyst - no f/u needed    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   NSVD (normal spontaneous vaginal delivery)    x 1   Orthostatic lightheadedness 08/16/2020   Osteopenia 08/06/2012   spine WNL, hip T -1.2   Osteoporosis 02/17/2019   DEXA 08/2012 spine WNL, hip T -1.2.   DEXA 03/2020 - spine -1.0, R hip neck -1.9 - however dx osteoporosis given h/o vertebral compression fracture on previous CXR, rec rpt 1 yr   Continue cal/vit D daily   First prolia injection 06/25/2020  Latest prolia injection 08/01/2021   Palpitations 03/28/2013   Pulsatile tinnitus of right ear 05/21/2021   Renal insufficiency 10/18/2019   Right-sided low back pain without sciatica 07/13/2021   Scalp psoriasis    TIA (transient ischemic attack) 06/08/2014   Unintentional weight loss 04/13/2014   Vitamin B12 deficiency 08/05/2021    Past Surgical History:  Procedure Laterality Date   ABDOMINAL HYSTERECTOMY     COLONOSCOPY  04/2015   diverticulosis, int hem, rpt 10 yrs (Nandigam)   DEXA  08/2012   spine WNL, hip T -1.2   PARTIAL HYSTERECTOMY  1987   due to fibroids, ovaries remain   R Lat Epicondyle Injections  1998   SHOULDER ARTHROSCOPY Right 11/2014   arthroscopic debridement and mini open rotator cuff repair of R shoulder for chronic impingement and rotator cuff tear by MRI ([facility]   Spiral CT  04/02   (-)    Current Outpatient Medications:    atorvastatin (LIPITOR) 40 MG tablet, Take 1 tablet (40 mg total) by mouth  daily., Disp: 90 tablet, Rfl: 3   Calcium Carb-Cholecalciferol (CALCIUM-VITAMIN D) 600-400 MG-UNIT TABS, Take 1 tablet by mouth 2 (two) times daily. , Disp: , Rfl:    cetirizine (ZYRTEC) 10 MG tablet, Take 10 mg by mouth daily., Disp: , Rfl:     clopidogrel (PLAVIX) 75 MG tablet, Take 1 tablet (75 mg total) by mouth daily., Disp: 90 tablet, Rfl: 3   co-enzyme Q-10 30 MG capsule, Take 30 mg by mouth daily., Disp: , Rfl:    cyanocobalamin (VITAMIN B12) 500 MCG tablet, Take 1 tablet (500 mcg total) by mouth daily., Disp: , Rfl:    hydrocortisone 2.5 % cream, Apply 1 application  topically 2 (two) times daily as needed (itching)., Disp: , Rfl:    levothyroxine (SYNTHROID) 25 MCG tablet, Take 1 tablet (25 mcg total) by mouth daily before breakfast., Disp: 90 tablet, Rfl: 3   Potassium 99 MG TABS, Take 1 tablet (99 mg total) by mouth daily., Disp: , Rfl:    Pyridoxine HCl (VITAMIN B-6 PO), Take 1 tablet by mouth daily., Disp: , Rfl:    rivastigmine (EXELON) 3 MG capsule, TAKE 1 CAPSULE (3 MG TOTAL) BY MOUTH 2 (TWO) TIMES DAILY., Disp: 180 capsule, Rfl: 0   sertraline (ZOLOFT) 25 MG tablet, TAKE 1 TABLET (25 MG TOTAL) BY MOUTH DAILY., Disp: 90 tablet, Rfl: 1   triamcinolone (KENALOG) 0.147 MG/GM topical spray, Apply topically 2 (two) times daily as needed. For psoriasis, Disp: 63 g, Rfl: 1  Clinical Interview:   The following information was obtained during a clinical interview with Ms. Michaelis and her son prior to cognitive testing.  Cognitive Symptoms: Decreased short-term memory: Endorsed. However, Ms. Lauman was very vague and was only able to describe generalized forgetfulness at times. She did highlight that she was unaware of memory loss until being told that it was an issue when meeting with her neurologist a per months prior. Her son reported greater memory concerns, primarily surrounding her repeating herself quickly and without knowledge or appreciation that she had recently told him the exact same thing. He also described some instances where she will have trouble recalling details of recent conversations. He was unsure of the overall timeline of cognitive dysfunction/decline outside of noting that Ms. Thayne seemed to decline broadly  following the passing of her husband in February 2021.  Decreased long-term memory: Denied. Decreased attention/concentration: Denied. Reduced processing speed: Denied. Difficulties with executive functions: Largely denied. She alluded to a longstanding weakness surrounding organization but did not feel that it had worsened lately. They denied trouble with impulsivity or any significant personality changes.  Difficulties with emotion regulation: Denied. Difficulties with receptive language: Denied. Difficulties with word finding: Denied. Decreased visuoperceptual ability: Denied.  Difficulties completing ADLs: Somewhat. Ms. Atchley lives alone with her son residing next door. Her son has observed that Ms. Depaulo will wear the same clothes multiple days in a row, causing him to feel the need to prompt her to "change her look." While he denied any frank hygiene concerns, he also wondered how frequently she was showering. Outside of this, her son will help organize her medications. She is responsible for taking these medications. She denied trouble managing finances or bill paying. However, her son did describe a recent instance where she seemingly forgot to pay/mail a bill she received. Medical records also suggest prior instances where she was not paying her [employer] bill and this had piled up over several months. She reported driving without difficulty. Her son was in  agreement with this.   Additional Medical History: History of traumatic brain injury/concussion: Denied. History of stroke: Denied. Prior TIA experiences are described above.  History of seizure activity: Denied. History of known exposure to toxins: Denied. Symptoms of chronic pain: Denied. Experience of frequent headaches/migraines: Denied. Frequent instances of dizziness/vertigo: Denied.  Sensory changes: She has recently had cataracts removed and described her vision as "fine" currently. Other sensory changes/difficulties  (e.g., hearing, taste, smell) were denied.  Balance/coordination difficulties: Denied. She also denied any recent falls. Other motor difficulties: Denied. She also denied any dyskinesia or myoclonic jerking.   Sleep History: Estimated hours obtained each night: 6-8 hours.  Difficulties falling asleep: Denied. Difficulties staying asleep: Denied. She did describe some previous trouble remaining asleep due to a growling experience in her stomach. Her son wondered if this was simply due to her not eating enough throughout the day and into the evening.  Feels rested and refreshed upon awakening: Endorsed.  History of snoring: Denied. History of waking up gasping for air: Denied. Witnessed breath cessation while asleep: Denied.  History of vivid dreaming: Denied. Excessive movement while asleep: Denied. Instances of acting out her dreams: Denied.  Psychiatric/Behavioral Health History: Depression: She endorsed a history of situational depressive experiences, particularly surrounding the passing of her husband in February 2021. She did not report to her knowledge being formally diagnosed with a major depressive disorder in the past. She described her current mood as "fine" and denied current or remote suicidal ideation, intent, or plan.  Anxiety: While she did not describe acute anxious distress, medical records do suggest a prior history of generalized anxiety and she has taken medications to help with these symptoms in the past.  Mania: Denied. Trauma History: Denied. Visual/auditory hallucinations: Endorsed. More recently, she and her son described several potential hallucination experiences. Lately, Ms. Bradwell has described seeing a couple in wedding clothing taking pictures in the yard outside of her home, as well as these individuals sitting in a large tree. Her son has noted Ms. Ishida making comments in the past surrounding fears of individuals looking into her home ("I'm tired of people  staring at me"). He further noted that many years prior, there was an isolated experience where Ms. Tullo reported seeing a fairly large animal run across the floor in their home. There was also a more remote time where Ms. Shartzer felt that individuals were lining up near the woods for an unknown reason. Ms. Gombos son lives next door and could not corroborate any of these experiences as being actual events.  Delusional thoughts: Denied outside what is described above.   Tobacco: Denied. Alcohol: She denied current alcohol consumption as well as a history of problematic alcohol abuse or dependence.  Recreational drugs: Denied.  Family History: Problem Relation Age of Onset   Stroke Mother        CVA   Heart disease Father        MI    Stroke Father        CVA   Hypertension Sister    Cancer Sister        ovarian and colon cancer   Aneurysm Brother    Hypertension Brother    Hypertension Brother    Cancer Paternal Grandfather        liver, alcohol   Alcohol abuse Paternal Grandfather    Cancer Maternal Aunt        breast   Dementia Maternal Aunt    Cancer Paternal Aunt        ?  colon   Cancer Other        prostate   This information was confirmed by Ms. Boster.  Academic/Vocational History: Highest level of educational attainment: 12 years. She graduated from high school and described herself as a good (mostly A) student in academic settings. No relative weaknesses were identified.  History of developmental delay: Denied. History of grade repetition: Denied. Enrollment in special education courses: Denied. History of LD/ADHD: Denied.  Employment: Retired. She previously worked for [employer] in [place]. 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   Evaluation Results:   Behavioral Observations: Ms. Barsanti was accompanied by her son, arrived to her appointment on time, and was appropriately dressed and groomed. She appeared alert.  Observed gait and station were within normal limits. Gross motor functioning appeared intact upon informal observation and no abnormal movements (e.g., tremors) were noted. Her affect was generally relaxed and positive. Spontaneous speech was fluent and word finding difficulties were not observed during the clinical interview. Thought processes were coherent, organized, and normal in content. Insight into her cognitive difficulties appeared poor as she generally denied all cognitive concerns during interview despite objective testing revealing several notable impairments, especially surrounding learning and memory.   During testing, she was noted to be very tangential, often repeating the same stories without perceived awareness that this information had already been stated several times. Sustained attention was appropriate. Task engagement was adequate and she persisted when challenged. She did fatigue as the evaluation progressed. Testing was abbreviated in response. Overall, Ms. Medaglia was cooperative with the clinical interview and subsequent testing procedures.   Adequacy of Effort: The validity of neuropsychological testing is limited by the extent to which the individual being tested may be assumed to have exerted adequate effort during testing. Ms. Mahurin expressed her intention to perform to the best of her abilities and exhibited adequate task engagement and persistence. Scores across stand-alone and embedded performance validity measures were within expectation. As such, the results of the current evaluation are believed to be a valid representation of Ms. Calandro's current cognitive functioning.  Test Results: Ms. Prehn was mildly disoriented at the time of the current evaluation. She was unable to provide her phone number. She was also unable to state the current date.   Intellectual abilities based upon educational and vocational attainment were estimated to be in the average range.  Premorbid abilities were estimated to be within the upper limits of the below average range based upon a single-word reading test.   Processing speed was mildly variable but overall below expectation, ranging from the exceptionally low to below average normative ranges. Basic attention was average. More complex attention (e.g., working memory) was unable to be assessed due to fatigue. Cognitive flexibility was exceptionally low. Other aspects of executive functioning were unable to be assessed.  Receptive language abilities were unable to be directly assessed. Ms. Tacker did not exhibit any difficulties comprehending questions asked of her during interview. Assessed expressive language was mildly variable. Phonemic fluency was average, semantic fluency was exceptionally low, and confrontation naming was exceptionally low to well below average.     Assessed visuospatial/visuoconstructional abilities were variable, ranging from the exceptionally low to average normative ranges.    Learning (i.e., encoding) of novel verbal information was exceptionally low. Spontaneous delayed recall (i.e., retrieval) of previously learned information was also exceptionally low. Retention rates were 0% across a story learning task, 0% across a list learning task, and 0% across a figure  drawing task. Performance across recognition tasks was average across a story-based task but exceptionally low across list and figure tasks, suggesting limited evidence for information consolidation.   Results of emotional screening instruments suggested that recent symptoms of generalized anxiety were in the mild range, while symptoms of depression were within normal limits. A screening instrument assessing recent sleep quality suggested the presence of minimal sleep dysfunction.  Tables of Scores:   Note: This summary of test scores accompanies the interpretive report and should not be considered in isolation without reference to the  appropriate sections in the text. Descriptors are based on appropriate normative data and may be adjusted based on clinical judgment. Terms such as "Within Normal Limits" and "Outside Normal Limits" are used when a more specific description of the test score cannot be determined.       Percentile - Normative Descriptor > 98 - Exceptionally High 91-97 - Well Above Average 75-90 - Above Average 25-74 - Average 9-24 - Below Average 2-8 - Well Below Average < 2 - Exceptionally Low       Orientation:      Raw Score Percentile   NAB Orientation, Form 1 24/29 --- ---       Cognitive Screening:      Raw Score Percentile   SLUMS: 14/30 --- ---       RBANS, Form A: Standard Score/ Scaled Score Percentile   Total Score 54 <1 Exceptionally Low  Immediate Memory 44 <1 Exceptionally Low    List Learning 2 <1 Exceptionally Low    Story Memory 1 <1 Exceptionally Low  Visuospatial/Constructional 81 10 Below Average    Figure Copy 10 50 Average    Line Orientation 8/20 <2 Exceptionally Low  Language 57 <1 Exceptionally Low    Picture Naming 8/10 3-9 Well Below Average    Semantic Fluency 1 <1 Exceptionally Low  Attention 82 12 Below Average    Digit Span 11 63 Average    Coding 3 1 Exceptionally Low  Delayed Memory 48 <1 Exceptionally Low    List Recall 0/10 <2 Exceptionally Low    List Recognition 15/20 <2 Exceptionally Low    Story Recall 1 <1 Exceptionally Low    Story Recognition 10/12 27-46 Average    Figure Recall 1 <1 Exceptionally Low    Figure Recognition 1/8 1 Exceptionally Low        Intellectual Functioning:      Standard Score Percentile   Test of Premorbid Functioning: 88 21 Below Average       Attention/Executive Function:     Trail Making Test (TMT): Raw Score (Scaled Score) Percentile     Part A 78 secs.,  0 errors (6) 9 Below Average    Part B Discontinued --- Impaired  *Based on [employer] Older Normative Studies (MOANS)          Language:     Verbal Fluency Test:  Raw Score (Scaled Score) Percentile     Phonemic Fluency (CFL) 26 (8) 25 Average    Category Fluency 13 (3) 1 Exceptionally Low  *Based on [employer] Older Normative Studies (MOANS)          NAB Language Module, Form 1: T Score Percentile     Naming 20/31 (22) <1 Exceptionally Low       Visuospatial/Visuoconstruction:      Raw Score Percentile   Clock Drawing: 8/10 --- Within Normal Limits       Mood and Personality:      Raw  Score Percentile   Geriatric Depression Scale: 9 --- Within Normal Limits  Geriatric Anxiety Scale: 20 --- Mild    Somatic 8 --- Mild    Cognitive 6 --- Mild    Affective 6 --- Mild       Additional Questionnaires:      Raw Score Percentile   PROMIS Sleep Disturbance Questionnaire: 24 --- None to Slight   Informed Consent and Coding/Compliance:   The current evaluation represents a clinical evaluation for the purposes previously outlined by the referral source and is in no way reflective of a forensic evaluation.   Ms. Mealing was provided with a verbal description of the nature and purpose of the present neuropsychological evaluation. Also reviewed were the foreseeable risks and/or discomforts and benefits of the procedure, limits of confidentiality, and mandatory reporting requirements of this provider. The patient was given the opportunity to ask questions and receive answers about the evaluation. Oral consent to participate was provided by the patient.   This evaluation was conducted by Christia Reading, Ph.D., ABPP-CN, board certified clinical neuropsychologist. Ms. Matsuoka completed a clinical interview with Dr. Melvyn Novas, billed as one unit [phone], and 125 minutes of cognitive testing and scoring, billed as one unit [phone] and three additional units 96139. Psychometrist Cruzita Lederer, B.S., assisted Dr. Melvyn Novas with test administration and scoring procedures. As a separate and discrete service, Dr. Melvyn Novas spent a total of 160 minutes in interpretation and report writing  billed as one unit [phone] and two units 96133.

## 2021-12-19 NOTE — Progress Notes (Signed)
   Psychometrician Note   Cognitive testing was administered to [name] by Cruzita Lederer, B.S. (psychometrist) under the supervision of Dr. Christia Reading, Ph.D., licensed psychologist on 12/19/2021. Ms. Whitsell did not appear overtly distressed by the testing session per behavioral observation or responses across self-report questionnaires. Rest breaks were offered.    The battery of tests administered was selected by Dr. Christia Reading, Ph.D. with consideration to Ms. Cheatum's current level of functioning, the nature of her symptoms, emotional and behavioral responses during interview, level of literacy, observed level of motivation/effort, and the nature of the referral question. This battery was communicated to the psychometrist. Communication between Dr. Christia Reading, Ph.D. and the psychometrist was ongoing throughout the evaluation and Dr. Christia Reading, Ph.D. was immediately accessible at all times. Dr. Christia Reading, Ph.D. provided supervision to the psychometrist on the date of this service to the extent necessary to assure the quality of all services provided.    [name] will return within approximately 1-2 weeks for an interactive feedback session with Dr. Melvyn Novas at which time her test performances, clinical impressions, and treatment recommendations will be reviewed in detail. Ms. Krygier understands she can contact our office should she require our assistance before this time.  A total of 125 minutes of billable time were spent face-to-face with Ms. Utecht by the psychometrist. This includes both test administration and scoring time. Billing for these services is reflected in the clinical report generated by Dr. Christia Reading, Ph.D.  This note reflects time spent with the psychometrician and does not include test scores or any clinical interpretations made by Dr. Melvyn Novas. The full report will follow in a separate note.

## 2021-12-24 ENCOUNTER — Telehealth: Payer: Self-pay | Admitting: Physician Assistant

## 2021-12-24 ENCOUNTER — Ambulatory Visit: Payer: Medicare HMO | Admitting: Psychology

## 2021-12-24 DIAGNOSIS — G309 Alzheimer's disease, unspecified: Secondary | ICD-10-CM

## 2021-12-24 DIAGNOSIS — F0280 Dementia in other diseases classified elsewhere without behavioral disturbance: Secondary | ICD-10-CM | POA: Diagnosis not present

## 2021-12-24 NOTE — Telephone Encounter (Signed)
Patient son has questions about medications  patient states that it is ok to speak with son

## 2021-12-24 NOTE — Progress Notes (Signed)
   Neuropsychology Feedback Session Marilyn Cook Department of Neurology  Reason for Referral:   Marilyn Cook is a 76 y.o. right-handed Caucasian female referred by Marilyn Butters, PA-C, to characterize her current cognitive functioning and assist with diagnostic clarity and treatment planning in the context of subjective cognitive decline and concerns for an underlying neurodegenerative illness   Feedback:   Marilyn Cook completed a comprehensive neuropsychological evaluation on 12/19/2021. Please refer to that encounter for the full report and recommendations. Briefly, results suggested impairment surrounding confrontation naming, semantic fluency, and essentially all aspects of learning and memory. Additional weaknesses were noted across processing speed and cognitive flexibility. Some variability was exhibited across visuospatial abilities, while performances were appropriate relative to age-matched peers across basic attention, receptive language, and phonemic fluency. Regarding etiology, I do have concerns surrounding underlying Alzheimer's disease. Across memory testing, Marilyn Cook did not benefit from repeated exposure to information, was fully amnestic (i.e., 0% retention) across all three memory tasks, and performed very poorly across 2/3 recognition trials. She was also noted to repeat the same stories to the psychometrist throughout testing without any apparent appreciation that this information had already been provided several times before. Taken together, this suggests rapid forgetting and an evolving and already quite notable memory storage impairment, both of which are the hallmark characteristics of this illness. Further deficits in confrontation naming and semantic fluency align with the expected progression of this illness as they represent the most common areas to be impacted following short-term memory. There could be a vascular contribution to her  presentation given neuroimaging suggesting moderate microvascular ischemic disease said to have somewhat progressed over the past several years. However, my suspicion is that this contribution is mild and only serves to worsen an underlying neurodegenerative illness. Hallucinations can be a concerning symptom of Lewy body disease. However, she does not display any other behavioral features of this illness and testing patterns align far more strongly with Alzheimer's disease, making this illness less likely. Hallucinations are not uncommon in Alzheimer's disease.  Marilyn Cook was accompanied by her son during the current feedback session. Content of the current session focused on the results of her neuropsychological evaluation. Marilyn Cook was given the opportunity to ask questions and her questions were answered. She was encouraged to reach out should additional questions arise. A copy of her report was provided at the conclusion of the visit.      60 minutes were spent conducting the current feedback session with Marilyn Cook and her son, billed as one unit [phone].

## 2021-12-25 ENCOUNTER — Ambulatory Visit (INDEPENDENT_AMBULATORY_CARE_PROVIDER_SITE_OTHER): Payer: Medicare HMO | Admitting: Family Medicine

## 2021-12-25 ENCOUNTER — Encounter: Payer: Medicare HMO | Admitting: Psychology

## 2021-12-25 ENCOUNTER — Encounter: Payer: Self-pay | Admitting: Family Medicine

## 2021-12-25 ENCOUNTER — Encounter: Payer: Self-pay | Admitting: Physician Assistant

## 2021-12-25 VITALS — BP 118/68 | HR 80 | Temp 97.8°F | Ht 66.0 in | Wt 112.8 lb

## 2021-12-25 DIAGNOSIS — F0280 Dementia in other diseases classified elsewhere without behavioral disturbance: Secondary | ICD-10-CM

## 2021-12-25 DIAGNOSIS — F411 Generalized anxiety disorder: Secondary | ICD-10-CM | POA: Diagnosis not present

## 2021-12-25 DIAGNOSIS — R634 Abnormal weight loss: Secondary | ICD-10-CM | POA: Diagnosis not present

## 2021-12-25 DIAGNOSIS — E538 Deficiency of other specified B group vitamins: Secondary | ICD-10-CM

## 2021-12-25 DIAGNOSIS — R69 Illness, unspecified: Secondary | ICD-10-CM | POA: Diagnosis not present

## 2021-12-25 DIAGNOSIS — G309 Alzheimer's disease, unspecified: Secondary | ICD-10-CM | POA: Diagnosis not present

## 2021-12-25 LAB — VITAMIN B12: Vitamin B-12: 464 pg/mL (ref 211–911)

## 2021-12-25 NOTE — Assessment & Plan Note (Signed)
Chronic, ongoing despite sertraline '25mg'$  daily. Consider increased dose but will postpone for now given endorsed intermittent GI upset symptoms ?hunger related. Weight overall stable.

## 2021-12-25 NOTE — Assessment & Plan Note (Signed)
Weight down to 112 lbs (from 122 lbs 05/2021).  ?forgetting to eat regularly - son is now more closely monitoring her eating habits.

## 2021-12-25 NOTE — Assessment & Plan Note (Addendum)
Appreciate neuro and neuropsych care. They will continue excelon '3mg'$  bid as well as sertraline '25mg'$  daily.  Notes ongoing anxiety and possible visual hallucinations.  Neuro f/u planned 03/2022.

## 2021-12-25 NOTE — Patient Instructions (Addendum)
Labs today (b12 level) Continue current medicines including excelon and sertraline.  Keep neurology appointment 03/2022.  Return to see me in 5 months for physical/wellness visit.

## 2021-12-25 NOTE — Assessment & Plan Note (Signed)
Regularly taking b12 541mg daily - update B12 levels on this daily replacement.

## 2021-12-25 NOTE — Progress Notes (Signed)
Patient ID: Marilyn Cook, female    DOB: [birth year], 76 y.o.   MRN: [id number]  This visit was conducted in person.  BP 118/68   Pulse 80   Temp 97.8 F (36.6 C) (Temporal)   Ht '5\' 6"'$  (1.676 m)   Wt 112 lb 12.8 oz (51.2 kg)   SpO2 99%   BMI 18.21 kg/m    CC: anxiety 3 mo f/u visit  Subjective:   HPI: Marilyn Cook is a 76 y.o. female presenting on 12/25/2021 for Anxiety (Here for 3 mo f/u. Pt accompanied by son, Marilyn Cook. )   See prior note for details.  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  She is now taking b12 582mg daily.   Due to ongoing anxiety - we started sertraline '25mg'$  daily. Overall tolerating well.  Son notes she's spending more time sleeping, in bedroom.   She saw Dr MMelvyn Novasearlier this month, notes reviewed. Testing more consistent with Alzheimer's disease. She continues to experience visual hallucinations that have not improved despite recently completing bilateral cataract surgery.   Next neurology appt is 03/07/2022.      Relevant past medical, surgical, family and social history reviewed and updated as indicated. Interim medical history since our last visit reviewed. Allergies and medications reviewed and updated. Outpatient Medications Prior to Visit  Medication Sig Dispense Refill   atorvastatin (LIPITOR) 40 MG tablet Take 1 tablet (40 mg total) by mouth daily. 90 tablet 3   Calcium Carb-Cholecalciferol (CALCIUM-VITAMIN D) 600-400 MG-UNIT TABS Take 1 tablet by mouth 2 (two) times daily.      cetirizine (ZYRTEC) 10 MG tablet Take 10 mg by mouth daily.     clopidogrel (PLAVIX) 75 MG tablet Take 1 tablet (75 mg total) by mouth daily. 90 tablet 3   co-enzyme Q-10 30 MG capsule Take 30 mg by mouth daily.     cyanocobalamin (VITAMIN B12) 500 MCG tablet Take 1 tablet (500 mcg total) by mouth daily.     hydrocortisone 2.5 % cream Apply 1 application  topically 2 (two) times daily as needed (itching).     levothyroxine  (SYNTHROID) 25 MCG tablet Take 1 tablet (25 mcg total) by mouth daily before breakfast. 90 tablet 3   Potassium 99 MG TABS Take 1 tablet (99 mg total) by mouth daily.     Pyridoxine HCl (VITAMIN B-6 PO) Take 1 tablet by mouth daily.     rivastigmine (EXELON) 3 MG capsule TAKE 1 CAPSULE (3 MG TOTAL) BY MOUTH 2 (TWO) TIMES DAILY. 180 capsule 0   sertraline (ZOLOFT) 25 MG tablet TAKE 1 TABLET (25 MG TOTAL) BY MOUTH DAILY. 90 tablet 1   triamcinolone (KENALOG) 0.147 MG/GM topical spray Apply topically 2 (two) times daily as needed. For psoriasis 63 g 1   No facility-administered medications prior to visit.     Per HPI unless specifically indicated in ROS section below Review of Systems  Objective:  BP 118/68   Pulse 80   Temp 97.8 F (36.6 C) (Temporal)   Ht '5\' 6"'$  (1.676 m)   Wt 112 lb 12.8 oz (51.2 kg)   SpO2 99%   BMI 18.21 kg/m   Wt Readings from Last 3 Encounters:  12/25/21 112 lb 12.8 oz (51.2 kg)  10/15/21 114 lb (51.7 kg)  10/08/21 114 lb 6 oz (51.9 kg)      Physical Exam Vitals and nursing note reviewed.  Constitutional:      Appearance: Normal appearance.  She is not ill-appearing.  HENT:     Mouth/Throat:     Comments: Wearing mask Cardiovascular:     Rate and Rhythm: Normal rate and regular rhythm.     Pulses: Normal pulses.     Heart sounds: Normal heart sounds. No murmur heard. Pulmonary:     Effort: Pulmonary effort is normal. No respiratory distress.     Breath sounds: Normal breath sounds. No wheezing, rhonchi or rales.  Abdominal:     General: Bowel sounds are normal. There is no distension.     Palpations: Abdomen is soft. There is no mass.     Tenderness: There is no abdominal tenderness. There is no guarding or rebound.     Hernia: No hernia is present.  Musculoskeletal:     Right lower leg: No edema.     Left lower leg: No edema.  Skin:    General: Skin is warm and dry.     Findings: No rash.  Neurological:     Mental Status: She is alert.   Psychiatric:        Mood and Affect: Mood normal.        Behavior: Behavior normal.       Lab Results  Component Value Date   CREATININE 0.74 06/19/2021   BUN 7 06/19/2021   NA 139 06/19/2021   K 3.4 (L) 06/19/2021   CL 102 06/19/2021   CO2 29 06/19/2021    Lab Results  Component Value Date   VITAMINB12 147 (L) 08/02/2021    Assessment & Plan:   Problem List Items Addressed This Visit     Unintentional weight loss    Weight down to 112 lbs (from 122 lbs 05/2021).  ?forgetting to eat regularly - son is now more closely monitoring her eating habits.       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 They will continue excelon '3mg'$  bid as well as sertraline '25mg'$  daily.  Notes ongoing anxiety and possible visual hallucinations.  Neuro f/u planned 03/2022.       Vitamin B12 deficiency - Primary    Regularly taking b12 539mg daily - update B12 levels on this daily replacement.       Relevant Orders   Vitamin B12   Generalized anxiety disorder    Chronic, ongoing despite sertraline '25mg'$  daily. Consider increased dose but will postpone for now given endorsed intermittent GI upset symptoms ?hunger related. Weight overall stable.         No orders of the defined types were placed in this encounter.  Orders Placed This Encounter  Procedures   Vitamin B12     Patient Instructions  Labs today (b12 level) Continue current medicines including excelon and sertraline.  Keep neurology appointment 03/2022.  Return to see me in 5 months for physical/wellness visit.  Follow up plan: Return in about 5 months (around 05/26/2022) for medicare wellness visit.  JRia Bush MD

## 2022-01-07 ENCOUNTER — Other Ambulatory Visit: Payer: Self-pay | Admitting: Physician Assistant

## 2022-01-07 MED ORDER — MEMANTINE HCL 5 MG PO TABS: ORAL_TABLET | ORAL | 11 refills | Status: DC

## 2022-01-17 ENCOUNTER — Encounter: Payer: Medicare HMO | Admitting: Psychology

## 2022-01-23 ENCOUNTER — Other Ambulatory Visit (HOSPITAL_COMMUNITY): Payer: Self-pay

## 2022-01-23 NOTE — Telephone Encounter (Signed)
Pt ready for scheduling on or after 02/02/22  Out-of-pocket cost due at time of visit: $296 (buy and bill) / $100 (pharmacy benefit)  Primary: Medicare Prolia co-insurance: 20% (approximately $276) Admin fee co-insurance: $20  Secondary:  Prolia co-insurance:  Admin fee co-insurance:   Deductible:   Prior Auth:  PA# Valid: 06/18/21-06/19/22   ** This summary of benefits is an estimation of the patient's out-of-pocket cost. Exact cost may vary based on individual plan coverage.

## 2022-01-23 NOTE — Telephone Encounter (Signed)
Pharmacy Patient Advocate Encounter  Insurance verification completed.    The patient is insured through [employer]   Ran test claims for: Prolia 60mg.  Pharmacy benefit copay: $100.00  

## 2022-01-24 ENCOUNTER — Encounter: Payer: Medicare HMO | Admitting: Psychology

## 2022-01-28 NOTE — Telephone Encounter (Signed)
Patient will call me back later today to discuss Prolia injection.

## 2022-02-01 ENCOUNTER — Other Ambulatory Visit: Payer: Self-pay | Admitting: Physician Assistant

## 2022-02-02 ENCOUNTER — Encounter: Payer: Self-pay | Admitting: Physician Assistant

## 2022-02-03 NOTE — Telephone Encounter (Signed)
I left a vmail for patient to call me back to sch Prolia inj.

## 2022-02-12 NOTE — Telephone Encounter (Signed)
Left vmail for patient to call back to sch Prolia inj.

## 2022-02-13 ENCOUNTER — Emergency Department (HOSPITAL_COMMUNITY)
Admission: EM | Admit: 2022-02-13 | Discharge: 2022-02-13 | Disposition: A | Discharge status: Home / Self Care | Payer: Medicare HMO | Attending: Emergency Medicine | Admitting: Emergency Medicine

## 2022-02-13 ENCOUNTER — Emergency Department (HOSPITAL_COMMUNITY): Payer: Medicare HMO

## 2022-02-13 ENCOUNTER — Ambulatory Visit (HOSPITAL_COMMUNITY)
Admission: EM | Admit: 2022-02-13 | Discharge: 2022-02-13 | Disposition: A | Discharge status: Home / Self Care | Payer: Medicare HMO

## 2022-02-13 ENCOUNTER — Other Ambulatory Visit: Payer: Self-pay

## 2022-02-13 DIAGNOSIS — E876 Hypokalemia: Secondary | ICD-10-CM | POA: Insufficient documentation

## 2022-02-13 DIAGNOSIS — R4182 Altered mental status, unspecified: Secondary | ICD-10-CM | POA: Diagnosis not present

## 2022-02-13 DIAGNOSIS — Z91040 Latex allergy status: Secondary | ICD-10-CM | POA: Diagnosis not present

## 2022-02-13 DIAGNOSIS — R4181 Age-related cognitive decline: Secondary | ICD-10-CM | POA: Diagnosis not present

## 2022-02-13 DIAGNOSIS — R4189 Other symptoms and signs involving cognitive functions and awareness: Secondary | ICD-10-CM

## 2022-02-13 DIAGNOSIS — R410 Disorientation, unspecified: Secondary | ICD-10-CM | POA: Insufficient documentation

## 2022-02-13 DIAGNOSIS — Z7902 Long term (current) use of antithrombotics/antiplatelets: Secondary | ICD-10-CM | POA: Insufficient documentation

## 2022-02-13 LAB — COMPREHENSIVE METABOLIC PANEL
ALT: 17 U/L (ref 0–44)
AST: 22 U/L (ref 15–41)
Albumin: 3.6 g/dL (ref 3.5–5.0)
Alkaline Phosphatase: 59 U/L (ref 38–126)
Anion gap: 7 (ref 5–15)
BUN: 12 mg/dL (ref 8–23)
CO2: 30 mmol/L (ref 22–32)
Calcium: 8.9 mg/dL (ref 8.9–10.3)
Chloride: 101 mmol/L (ref 98–111)
Creatinine, Ser: 0.72 mg/dL (ref 0.44–1.00)
GFR, Estimated: >60 mL/min (ref >60)
Glucose, Bld: 102 mg/dL — ABNORMAL HIGH (ref 70–99)
Potassium: 3.3 mmol/L — ABNORMAL LOW (ref 3.5–5.1)
Sodium: 138 mmol/L (ref 135–145)
Total Bilirubin: <0.1 mg/dL — ABNORMAL LOW (ref 0.3–1.2)
Total Protein: 6.5 g/dL (ref 6.5–8.1)

## 2022-02-13 LAB — CBC WITH DIFFERENTIAL/PLATELET
Abs Immature Granulocytes: 0.01 10*3/uL (ref 0.00–0.07)
Basophils Absolute: 0 10*3/uL (ref 0.0–0.1)
Basophils Relative: 0 %
Eosinophils Absolute: 0.1 10*3/uL (ref 0.0–0.5)
Eosinophils Relative: 2 %
HCT: 38.2 % (ref 36.0–46.0)
Hemoglobin: 12.8 g/dL (ref 12.0–15.0)
Immature Granulocytes: 0 %
Lymphocytes Relative: 16 %
Lymphs Abs: 0.9 10*3/uL (ref 0.7–4.0)
MCH: 32.2 pg (ref 26.0–34.0)
MCHC: 33.5 g/dL (ref 30.0–36.0)
MCV: 96.2 fL (ref 80.0–100.0)
Monocytes Absolute: 0.7 10*3/uL (ref 0.1–1.0)
Monocytes Relative: 13 %
Neutro Abs: 3.9 10*3/uL (ref 1.7–7.7)
Neutrophils Relative %: 69 %
Platelets: 185 10*3/uL (ref 150–400)
RBC: 3.97 MIL/uL (ref 3.87–5.11)
RDW: 13.3 % (ref 11.5–15.5)
WBC: 5.6 10*3/uL (ref 4.0–10.5)
nRBC: 0 % (ref 0.0–0.2)

## 2022-02-13 LAB — URINALYSIS, ROUTINE W REFLEX MICROSCOPIC
Bilirubin Urine: NEGATIVE
Glucose, UA: NEGATIVE mg/dL
Ketones, ur: NEGATIVE mg/dL
Leukocytes,Ua: NEGATIVE
Nitrite: NEGATIVE
Protein, ur: NEGATIVE mg/dL
Specific Gravity, Urine: 1.004 — ABNORMAL LOW (ref 1.005–1.030)
pH: 6 (ref 5.0–8.0)

## 2022-02-13 NOTE — ED Notes (Signed)
To CT in NAD.

## 2022-02-13 NOTE — ED Provider Notes (Signed)
Seen briefly in triage with her son attending and giving history.  They were on a cruise and got back to the port today.  Yesterday he noted an episode where she was more confused than usual about where they were.  It resolved once he oriented her to where they were in the ship.  Then driving back today she is possibly had some urinary incontinence which she does not usually have.  Also she has had difficulty orienting to who was driving the car.  No fever or chills  She does have a history of dementia, but this is an acute change.  I have asked them to proceed to the emergency room for higher level of evaluation and treatment then we can provide here in the urgent care setting.   Barrett Henle, MD 02/13/22 [phone]

## 2022-02-13 NOTE — Discharge Instructions (Signed)
Please follow-up with your family doctor this week.  All of your tests have come back reassuring.  No signs of stroke, no signs of infection, make sure you are drinking plenty of clear liquids  Thank you for allowing [place] to treat you in the emergency department today.  After reviewing your examination and potential testing that was done it appears that you are safe to go home.  I would like for you to follow-up with your doctor within the next several days, have them obtain your results and follow-up with them to review all of these tests.  If you should develop severe or worsening symptoms return to the emergency department immediately

## 2022-02-13 NOTE — ED Provider Notes (Signed)
[facility] Provider Note   CSN: [id number] Arrival date & time: 02/13/22  1859     History  Chief Complaint  Patient presents with   Confusion/Disoriented    Marilyn Cook is a 77 y.o. female.  HPI   This patient is a 77 year old female, history of high cholesterol, history of early dementia according to the son who is the primary historian.  The patient was recently evaluated and thought to have some early Alzheimer's and according to the son there has been episodes where she has had some confusion here and there.  He reports that she will walk out of the house with her handbag and ask him to come pick her up and take her home.  She will occasionally state that she is seeing something in the neighbors yard that is not there.  She thinks that she is hearing sounds in her stomach that keep her up at night.  They recently went on a cruise and she had some transient confusion about where they were while she was on the cruise and on the way home from the cruise last night she was commenting on wanting to know when there was a person waiting for her.  There was no body that she was referring to.  Today she has not had any issues or complaints.  There is been no fevers chills nausea vomiting or diarrhea.  No urinary symptoms other than a small amount of urinary incontinence which occurred on the seat of the car.  No witnessed seizures, no other complaints  Home Medications Prior to Admission medications   Medication Sig Start Date End Date Taking? Authorizing Provider  atorvastatin (LIPITOR) 40 MG tablet Take 1 tablet (40 mg total) by mouth daily. 02/15/21   Ria Bush, MD  Calcium Carb-Cholecalciferol (CALCIUM-VITAMIN D) 600-400 MG-UNIT TABS Take 1 tablet by mouth 2 (two) times daily.     [provider]  cetirizine (ZYRTEC) 10 MG tablet Take 10 mg by mouth daily.    [provider]  clopidogrel (PLAVIX) 75 MG tablet Take 1  tablet (75 mg total) by mouth daily. 02/15/21   Ria Bush, MD  co-enzyme Q-10 30 MG capsule Take 30 mg by mouth daily.    [provider]  cyanocobalamin (VITAMIN B12) 500 MCG tablet Take 1 tablet (500 mcg total) by mouth daily. 09/25/21   Ria Bush, MD  hydrocortisone 2.5 % cream Apply 1 application  topically 2 (two) times daily as needed (itching).    [provider]  levothyroxine (SYNTHROID) 25 MCG tablet Take 1 tablet (25 mcg total) by mouth daily before breakfast. 02/15/21   Ria Bush, MD  memantine (NAMENDA) 5 MG tablet TAKE 1 TABLET (5 MG AT NIGHT) FOR 2 WEEKS, THEN INCREASE TO 1 TABLET (5 MG) TWICE A DAY 02/03/22   Shawn Route, Coralee Pesa, PA-C  Potassium 99 MG TABS Take 1 tablet (99 mg total) by mouth daily. 09/25/21   Ria Bush, MD  Pyridoxine HCl (VITAMIN B-6 PO) Take 1 tablet by mouth daily.    [provider]  sertraline (ZOLOFT) 25 MG tablet TAKE 1 TABLET (25 MG TOTAL) BY MOUTH DAILY. 10/21/21   Ria Bush, MD  triamcinolone (KENALOG) 0.147 MG/GM topical spray Apply topically 2 (two) times daily as needed. For psoriasis 02/06/17   Ria Bush, MD      Allergies    Aspirin, Diphenhydramine hcl, Donepezil, Eggs or egg-derived products, Epinephrine, Latex, Other, Penicillins, and Sulfasalazine  Review of Systems   Review of Systems  All other systems reviewed and are negative.   Physical Exam Updated Vital Signs BP (!) 144/69   Pulse 72   Temp 98.3 F (36.8 C)   Resp 17   SpO2 99%  Physical Exam Vitals and nursing note reviewed.  Constitutional:      General: She is not in acute distress.    Appearance: She is well-developed.  HENT:     Head: Normocephalic and atraumatic.     Nose: No congestion or rhinorrhea.     Mouth/Throat:     Mouth: Mucous membranes are moist.     Pharynx: No oropharyngeal exudate or posterior oropharyngeal erythema.  Eyes:     General: No scleral icterus.       Right eye: No  discharge.        Left eye: No discharge.     Conjunctiva/sclera: Conjunctivae normal.     Pupils: Pupils are equal, round, and reactive to light.  Neck:     Thyroid: No thyromegaly.     Vascular: No JVD.  Cardiovascular:     Rate and Rhythm: Normal rate and regular rhythm.     Heart sounds: Normal heart sounds. No murmur heard.    No friction rub. No gallop.  Pulmonary:     Effort: Pulmonary effort is normal. No respiratory distress.     Breath sounds: Normal breath sounds. No wheezing or rales.  Abdominal:     General: Bowel sounds are normal. There is no distension.     Palpations: Abdomen is soft. There is no mass.     Tenderness: There is no abdominal tenderness.  Musculoskeletal:        General: No tenderness. Normal range of motion.     Cervical back: Normal range of motion and neck supple.     Right lower leg: No edema.     Left lower leg: No edema.  Lymphadenopathy:     Cervical: No cervical adenopathy.  Skin:    General: Skin is warm and dry.     Findings: No erythema or rash.  Neurological:     Mental Status: She is alert.     Coordination: Coordination normal.     Comments: Able to answer all my questions appropriately, able to do finger-nose-finger without difficulty, no pronator drift, no sensory deficits or motor deficits, cranial nerves III through XII are intact.  Memory is good, she knows where she is, she knows the date, she knows that she was on a cruise.  She is not responding to any internal stimuli  Psychiatric:        Behavior: Behavior normal.     ED Results / Procedures / Treatments   Labs (all labs ordered are listed, but only abnormal results are displayed) Labs Reviewed  URINALYSIS, ROUTINE W REFLEX MICROSCOPIC - Abnormal; Notable for the following components:      Result Value   Color, Urine STRAW (*)    Specific Gravity, Urine 1.004 (*)    Hgb urine dipstick SMALL (*)    Bacteria, UA RARE (*)    All other components within normal limits   COMPREHENSIVE METABOLIC PANEL - Abnormal; Notable for the following components:   Potassium 3.3 (*)    Glucose, Bld 102 (*)    Total Bilirubin <0.1 (*)    All other components within normal limits  CBC WITH DIFFERENTIAL/PLATELET    EKG None  Radiology CT Head Wo Contrast  Result Date: 02/13/2022 CLINICAL  DATA:  Altered mental status. EXAM: CT HEAD WITHOUT CONTRAST TECHNIQUE: Contiguous axial images were obtained from the base of the skull through the vertex without intravenous contrast. RADIATION DOSE REDUCTION: This exam was performed according to the departmental dose-optimization program which includes automated exposure control, adjustment of the mA and/or kV according to patient size and/or use of iterative reconstruction technique. COMPARISON:  Head CT dated 08/10/2020 and MRI dated 08/16/2021. FINDINGS: Brain: Mild age-related atrophy and chronic microvascular ischemic changes. There is no acute intracranial hemorrhage. No mass effect or midline shift. No extra-axial fluid collection. Vascular: No hyperdense vessel or unexpected calcification. Skull: Normal. Negative for fracture or focal lesion. Sinuses/Orbits: No acute finding. Other: None IMPRESSION: 1. No acute intracranial pathology. 2. Mild age-related atrophy and chronic microvascular ischemic changes. Electronically Signed   By: Anner Crete M.D.   On: 02/13/2022 23:04    Procedures Procedures    Medications Ordered in ED Medications - No data to display  ED Course/ Medical Decision Making/ A&P                             Medical Decision Making Amount and/or Complexity of Data Reviewed Labs: ordered. Radiology: ordered.   This patient presents to the ED for concern of altered MS differential diagnosis includes rest of dementia, UTI, electrolyte abnormalities, does not seem to be consistent with seizure or stroke    Additional history obtained:  Additional history obtained from family member as well as the  medical record External records from outside source obtained and reviewed including multiple office visits, cystitis as recently as October 2023   Lab Tests:  I Ordered, and personally interpreted labs.  The pertinent results include: Urinalysis which is clean no signs of infection, lab work which is unremarkable.  Except for a mild hypokalemia which is of no consequence and similar to prior values   Imaging Studies ordered:  I ordered imaging studies including CT scan of the brain I independently visualized and interpreted imaging which showed no acute findings I agree with the radiologist interpretation   Medicines ordered and prescription drug management:   I have reviewed the patients home medicines and have made adjustments as needed   Problem List / ED Course:  Well-appearing, no distress, vitals unremarkable, doubt acute stroke, likely some progressive cognitive decline   Social Determinants of Health:             Final Clinical Impression(s) / ED Diagnoses Final diagnoses:  Confusion  Cognitive decline    Rx / DC Orders ED Discharge Orders     None         Noemi Chapel, MD 02/13/22 2313

## 2022-02-13 NOTE — ED Triage Notes (Addendum)
Patient's son reported intermittent confusion /disoriented onset 2 weeks ago , patient alert and oriented at triage , denies fever or chills , no SOB or pain .

## 2022-02-14 ENCOUNTER — Telehealth: Payer: Self-pay | Admitting: Anesthesiology

## 2022-02-14 NOTE — Telephone Encounter (Signed)
I got patient sch for 03-03-22 patient son has some questions about medication please call

## 2022-02-14 NOTE — Telephone Encounter (Signed)
Pt's son left message with AN stating he needs an appointment for his mother. He sent a message last month that was not answered by the office. She is on a new medication that is causing hallucinations and severe memory loss. Per nurse's advice he took his mother to the ER.  Detailed note from nurse was put in [facility] box.

## 2022-02-18 NOTE — Telephone Encounter (Signed)
Sent msg via mychart for patient to call when ready to schedule [facility] labs and inj appts.

## 2022-03-03 ENCOUNTER — Encounter: Payer: Self-pay | Admitting: Physician Assistant

## 2022-03-03 ENCOUNTER — Ambulatory Visit: Payer: Medicare HMO | Admitting: Physician Assistant

## 2022-03-03 VITALS — BP 123/73 | HR 78 | Ht 64.0 in | Wt 117.8 lb

## 2022-03-03 DIAGNOSIS — F0280 Dementia in other diseases classified elsewhere without behavioral disturbance: Secondary | ICD-10-CM | POA: Diagnosis not present

## 2022-03-03 DIAGNOSIS — G309 Alzheimer's disease, unspecified: Secondary | ICD-10-CM

## 2022-03-03 DIAGNOSIS — R69 Illness, unspecified: Secondary | ICD-10-CM | POA: Diagnosis not present

## 2022-03-03 MED ORDER — MEMANTINE HCL 10 MG PO TABS: ORAL_TABLET | ORAL | 11 refills | Status: DC

## 2022-03-03 MED ORDER — DIVALPROEX SODIUM 125 MG PO DR TAB: 125.0000 mg | DELAYED_RELEASE_TABLET | Freq: Every evening | ORAL | 3 refills | Status: DC

## 2022-03-03 NOTE — Progress Notes (Signed)
Assessment/Plan:   Dementia likely due to Alzheimer disease, mild, with behavioral disturbance  Marilyn Cook is a very pleasant 77 y.o. RH female with  a history of hypertension, hyperlipidemia, TIA 2017 and a diagnosis of dementia due to Alzheimer's disease as per neuropsychological evaluation on 12/19/2021, seen today in follow up for memory loss.  Patient was on rivastigmine 3 mg twice daily, however she did not tolerate due to GI issues.  Patient is currently on memantine 5 mg twice daily, tolerating well.  Prior MRI brain on November 2023personally reviewed was remarkable for chronic small vessel ischemic changes, moderate cerebral white matter disease and mild in the pons, and moderate generalized cerebral atrophy, with mild cerebellar atrophy.  Patient has more episodes of sundowning than prior, thus, will initiate first treatment for the sundowning prior to increasing her donepezil, family agrees.      Follow up in 6 months. Start Depakote 125 mg at night, can increase to 125 twice daily for visual hallucinations, sundowning.  Increase memantine to 10 mg twice daily if tolerated, after initiation of Depakote side effects discussed.  Continue B12 supplements Recommend good control of cardiovascular risk factors. Continue to control mood as per PCP.  Patient is on Zoloft daily    Subjective:    This patient is accompanied in the office by her son who supplements the history.  Previous records as well as any outside records available were reviewed prior to todays visit. Patient was last seen on 09/02/2021.  Last MoCA on 08/02/2021 was 15/30.   Any changes in memory since last visit? Not any better, able to remember names of people for the most part but not as good as it was last month.  She has little insight to her condition. repeats oneself?  Endorsed Disoriented when walking into a room?  Patient denies She called her son at night was in [place] in a house that looked like her  house (it was her house).   Leaving objects in unusual places?    denies   Wandering behavior?  denies   Any personality changes since last visit?  She had a presentation to the ED on 02/13/2022 with increased confusion and disorientation.  CT of the head at the time was negative for acute intracranial pathology.  No UTI. Any worsening depression?:  denies   Hallucinations or paranoia?  She sees (new) someone looking through the grits on the door, "woman in the tree across the neighbor house ", she sees children as well.  This hallucinations are not frightening.  She also complains of some noises in the stomach, although this may be related to her decreased frequency of foods, "she forgets to eat ". Seizures?    denies    Any sleep changes?" A lot during the daytime " Denies vivid dreams, REM behavior or sleepwalking   Sleep apnea?   denies   Any hygiene concerns?  She may forget the shower, needs assistance. Does the patient needs help with medications?  Patient is in charge although she has encountered late meals. Who is in charge of the finances?  Son  is in charge    Any changes in appetite?  "  She was eating great in the cruise and now, she is not eating as well " Patient have trouble swallowing?  denies   Does the patient cook? No   Any kitchen accidents such as leaving the stove on? Patient denies   Any headaches?   denies   Chronic  back pain  denies   Ambulates with difficulty?     denies   Recent falls or head injuries? denies     Unilateral weakness, numbness or tingling?    denies   Any tremors?  denies   Any anosmia?  Patient denies   Any incontinence of urine?  Endorsed, only when she went on her trip. No recurrence  Any bowel dysfunction?     denies      Patient lives   alone, with her son next-door. Does the patient drive? No longer drives   Initial evaluation 07/25/2021 the patient is accompanied by her son who supplements the history.     How long did patient have  memory difficulties? She denies any memory changes, but her son reports that since Mar 07, 2022 after her husband died. Son was concerned because about 1 month ago she did not remember the 4 digits of the house alarm "was trying to do 3 digits, but later that afternoon, she remembered that there were 4 digits".  "She used to be a whiz with numbers, not now ".  Most of the concerns are due to short-term memory.  There was an episode recently, where her son told the story, and immediately, she repeated the story without recall that her son had just said the same thing. Patient lives with:  Patient lives alone after losing her husband but her son lives next door repeats oneself?  "Over and over for years, but we always joked about it, never made much of it ".  Disoriented when walking into a room?  Patient denies   Leaving objects in unusual places?  Patient denies   Ambulates  with difficulty?   Patient denies   Recent falls?  Patient denies   Any head injuries?  Patient denies   History of seizures?   Patient denies. She was evaluated in 2017 for transient alteration of awareness but it was negative for seizures. Wandering behavior?  Patient denies   Patient drives?   Denies any issues, tries to stay off of the highway.  Son reports that she is a very good driver to date. Any mood changes such irritability agitation?  Denies  Any history of depression?:  Patient denies   Hallucinations? Son reports she sees things, like a light at nigh and a ring around it, but one da instead of basketball, she thought she saw a person.  Sometimes she states is shaped like a leaf, but she thinks it is an Tax inspector.  Her son is not sure if this is an hallucination or is distorted image because of her history of glaucoma.  If she senses that is something moving in the woods, she is afraid to go outside. Paranoia?  "She was paranoid about a neighbor for a while, but even after his death, and other people moving into that  house she is still gets anxious about passing by that house ".  She is also afraid of the darkness, and does not like to go downstairs to the laundry room, so she washes all her clothing by hand although she is working on it ".   Patient reports that he sleeps well without vivid dreams, REM behavior or sleepwalking   History of sleep apnea?  Patient denies   Any hygiene concerns?  Denies Independent of bathing and dressing?  Endorsed  Does the patient needs help with medications? Son monitors, she has a pill tray  Who is in charge of the finances?  Patient is  in charge but son monitors, she doesn't like [employer] so she would not pay so she got behind.  "She doesn't like to spend money ". Any changes in appetite?  "Never has been a good eater ".  Patient have trouble swallowing? Patient denies   Does the patient cook?  Patient denies   Any kitchen accidents such as leaving the stove on? Patient denies   Any headaches?  Patient denies   Double vision? Patient denies   Any focal numbness or tingling?  Patient denies   Chronic back pain Patient denies   Unilateral weakness?  Patient denies   Any tremors?  Patient denies   Any history of anosmia?  Patient denies   Any incontinence of urine?  Patient denies   Any bowel dysfunction?   Patient denies    History of heavy alcohol intake?  Patient denies   History of heavy tobacco use?  Patient denies   Family history of dementia?   Aunt with dementia    PREVIOUS MEDICATIONS: Donepezil, rivastigmine  CURRENT MEDICATIONS:  Outpatient Encounter Medications as of 03/03/2022  Medication Sig   atorvastatin (LIPITOR) 40 MG tablet Take 1 tablet (40 mg total) by mouth daily.   cetirizine (ZYRTEC) 10 MG tablet Take 10 mg by mouth daily.   clonazePAM (KLONOPIN) 0.5 MG tablet Take 0.5 mg by mouth once.   clopidogrel (PLAVIX) 75 MG tablet Take 1 tablet (75 mg total) by mouth daily.   co-enzyme Q-10 30 MG capsule Take 30 mg by mouth daily.   cyanocobalamin  (VITAMIN B12) 500 MCG tablet Take 1 tablet (500 mcg total) by mouth daily.   divalproex (DEPAKOTE) 125 MG DR tablet Take 1 tablet (125 mg total) by mouth at bedtime.   hydrocortisone 2.5 % cream Apply 1 application  topically 2 (two) times daily as needed (itching).   levothyroxine (SYNTHROID) 25 MCG tablet Take 1 tablet (25 mcg total) by mouth daily before breakfast.   Potassium 99 MG TABS Take 1 tablet (99 mg total) by mouth daily.   sertraline (ZOLOFT) 25 MG tablet TAKE 1 TABLET (25 MG TOTAL) BY MOUTH DAILY.   triamcinolone (KENALOG) 0.147 MG/GM topical spray Apply topically 2 (two) times daily as needed. For psoriasis   [DISCONTINUED] memantine (NAMENDA) 5 MG tablet TAKE 1 TABLET (5 MG AT NIGHT) FOR 2 WEEKS, THEN INCREASE TO 1 TABLET (5 MG) TWICE A DAY   Calcium Carb-Cholecalciferol (CALCIUM-VITAMIN D) 600-400 MG-UNIT TABS Take 1 tablet by mouth 2 (two) times daily.  (Patient not taking: Reported on 03/03/2022)   memantine (NAMENDA) 10 MG tablet Take 1 tablet ( twice a day   Pyridoxine HCl (VITAMIN B-6 PO) Take 1 tablet by mouth daily. (Patient not taking: Reported on 03/03/2022)   No facility-administered encounter medications on file as of 03/03/2022.       02/17/2020    1:48 PM 02/14/2019    3:01 PM  MMSE - Mini Mental State Exam  Not completed: Unable to complete   Orientation to time  5  Orientation to Place  5  Registration  3  Attention/ Calculation  5  Recall  3  Language- repeat  1      08/02/2021   12:00 PM  Montreal Cognitive Assessment   Visuospatial/ Executive (0/5) 2  Naming (0/3) 2  Attention: Read list of digits (0/2) 2  Attention: Read list of letters (0/1) 1  Attention: Serial 7 subtraction starting at 100 (0/3) 3  Language: Repeat phrase (0/2) 1  Language :  Fluency (0/1) 0  Abstraction (0/2) 0  Delayed Recall (0/5) 0  Orientation (0/6) 4  Total 15  Adjusted Score (based on education) 15    Objective:     PHYSICAL EXAMINATION:    VITALS:   Vitals:    03/03/22 0911  BP: 123/73  Pulse: 78  SpO2: 96%  Weight: 117 lb 12.8 oz (53.4 kg)  Height: '5\' 4"'$  (1.626 m)    GEN:  The patient appears stated age and is in NAD. HEENT:  Normocephalic, atraumatic.   Neurological examination:  General: NAD, well-groomed, appears stated age. Orientation: The patient is alert. Oriented to person, place and not to date.  Cranial nerves: There is good facial symmetry.The speech is fluent and clear. No aphasia or dysarthria. Fund of knowledge is reduced. Recent and remote memory are impaired. Attention and concentration are reduced.  Able to name objects and repeat phrases.  Hearing is intact to conversational tone.    Sensation: Sensation is intact to light touch throughout Motor: Strength is at least antigravity x4. DTR's 2/4 in UE/LE     Movement examination: Tone: There is normal tone in the UE/LE Abnormal movements:  no tremor.  No myoclonus.  No asterixis.   Coordination:  There is no decremation with RAM's. Normal finger to nose  Gait and Station: The patient has no difficulty arising out of a deep-seated chair without the use of the hands. The patient's stride length is good.  Gait is cautious and narrow.    Thank you for allowing [place] the opportunity to participate in the care of this nice patient. Please do not hesitate to contact us for any questions or concerns.   Total time spent on today's visit was 39 minutes dedicated to this patient today, preparing to see patient, examining the patient, ordering tests and/or medications and counseling the patient, documenting clinical information in the EHR or other health record, independently interpreting results and communicating results to the patient/family, discussing treatment and goals, answering patient's questions and coordinating care.  Cc:  Ria Bush, MD  Sharene Butters 03/03/2022 3:32 PM

## 2022-03-03 NOTE — Patient Instructions (Signed)
It was a pleasure to see you today at our office.   Recommendations:  Follow up in 6  months Continue Memantine  but increase to 10 mg twice daily.Side effects were discussed  Start Depakote 125 mg at night for hallucinations  Feel free to visit Facebook page " [facility]" for tips of how to care for people with memory problems.  Continue Zoloft for anxiety      Whom to call:  Memory  decline, memory medications: Call our office [phone]   For psychiatric meds, mood meds: Please have your primary care physician manage these medications.     For assessment of decision of mental capacity and competency:  Call Dr. Anthoney Harada, geriatric psychiatrist at [phone]  For guidance in geriatric dementia issues please call [facility] [phone]    If you have any severe symptoms of a stroke, or other severe issues such as confusion,severe chills or fever, etc call 911 or go to the ER as you may need to be evaluated further        RECOMMENDATIONS FOR ALL PATIENTS WITH MEMORY PROBLEMS: 1. Continue to exercise (Recommend 30 minutes of walking everyday, or 3 hours every week) 2. Increase social interactions - continue going to [place] and enjoy social gatherings with friends and family 3. Eat healthy, avoid fried foods and eat more fruits and vegetables 4. Maintain adequate blood pressure, blood sugar, and blood cholesterol level. Reducing the risk of stroke and cardiovascular disease also helps promoting better memory. 5. Avoid stressful situations. Live a simple life and avoid aggravations. Organize your time and prepare for the next day in anticipation. 6. Sleep well, avoid any interruptions of sleep and avoid any distractions in the bedroom that may interfere with adequate sleep quality 7. Avoid sugar, avoid sweets as there is a strong link between excessive sugar intake, diabetes, and cognitive impairment We discussed the Mediterranean diet, which has been shown to  help patients reduce the risk of progressive memory disorders and reduces cardiovascular risk. This includes eating fish, eat fruits and green leafy vegetables, nuts like almonds and hazelnuts, walnuts, and also use olive oil. Avoid fast foods and fried foods as much as possible. Avoid sweets and sugar as sugar use has been linked to worsening of memory function.  There is always a concern of gradual progression of memory problems. If this is the case, then we may need to adjust level of care according to patient needs. Support, both to the patient and caregiver, should then be put into place.    The [employer] is here all day, every day for people facing Alzheimer's disease through our free 24/7 Helpline: [phone]. The Helpline provides reliable information and support to all those who need assistance, such as individuals living with memory loss, Alzheimer's or other dementia, caregivers, health care professionals and the public.  Our highly trained and knowledgeable staff can help you with: Understanding memory loss, dementia and Alzheimer's  Medications and other treatment options  General information about aging and brain health  Skills to provide quality care and to find the best care from professionals  Legal, financial and living-arrangement decisions Our Helpline also features: Confidential care consultation provided by master's level clinicians who can help with decision-making support, crisis assistance and education on issues families face every day  Help in a caller's preferred language using our translation service that features more than 200 languages and dialects  Referrals to local community programs, services and ongoing support  FALL PRECAUTIONS: Be cautious when walking. Scan the area for obstacles that may increase the risk of trips and falls. When getting up in the mornings, sit up at the edge of the bed for a few minutes before getting out of bed.  Consider elevating the bed at the head end to avoid drop of blood pressure when getting up. Walk always in a well-lit room (use night lights in the walls). Avoid area rugs or power cords from appliances in the middle of the walkways. Use a walker or a cane if necessary and consider physical therapy for balance exercise. Get your eyesight checked regularly.  FINANCIAL OVERSIGHT: Supervision, especially oversight when making financial decisions or transactions is also recommended.  HOME SAFETY: Consider the safety of the kitchen when operating appliances like stoves, microwave oven, and blender. Consider having supervision and share cooking responsibilities until no longer able to participate in those. Accidents with firearms and other hazards in the house should be identified and addressed as well.   ABILITY TO BE LEFT ALONE: If patient is unable to contact 911 operator, consider using LifeLine, or when the need is there, arrange for someone to stay with patients. Smoking is a fire hazard, consider supervision or cessation. Risk of wandering should be assessed by caregiver and if detected at any point, supervision and safe proof recommendations should be instituted.  MEDICATION SUPERVISION: Inability to self-administer medication needs to be constantly addressed. Implement a mechanism to ensure safe administration of the medications.   DRIVING: Regarding driving, in patients with progressive memory problems, driving will be impaired. We advise to have someone else do the driving if trouble finding directions or if minor accidents are reported. Independent driving assessment is available to determine safety of driving.   If you are interested in the driving assessment, you can contact the following:  [employer] in [place]  [place] [phone] or [phone]       [place] refers to food and lifestyle choices that are based on the traditions of countries located on the [employer]. This way of eating has been shown to help prevent certain conditions and improve outcomes for people who have chronic diseases, like kidney disease and heart disease. What are tips for following this plan? Lifestyle  Cook and eat meals together with your family, when possible. Drink enough fluid to keep your urine clear or pale yellow. Be physically active every day. This includes: Aerobic exercise like running or swimming. Leisure activities like gardening, walking, or housework. Get 7-8 hours of sleep each night. If recommended by your health care provider, drink red wine in moderation. This means 1 glass a day for nonpregnant women and 2 glasses a day for men. A glass of wine equals 5 oz (150 mL). Reading food labels  Check the serving size of packaged foods. For foods such as rice and pasta, the serving size refers to the amount of cooked product, not dry. Check the total fat in packaged foods. Avoid foods that have saturated fat or trans fats. Check the ingredients list for added sugars, such as corn syrup. Shopping  At the grocery store, buy most of your food from the areas near the walls of the store. This includes: Fresh fruits and vegetables (produce). Grains, beans, nuts, and seeds. Some of these may be available in unpackaged forms or large amounts (in bulk). Fresh seafood. Poultry and eggs. Low-fat dairy products.  Buy whole ingredients instead of prepackaged foods. Buy fresh fruits and vegetables in-season from local farmers markets. Buy frozen fruits and vegetables in resealable bags. If you do not have access to quality fresh seafood, buy precooked frozen shrimp or canned fish, such as tuna, salmon, or sardines. Buy small amounts of raw or cooked vegetables, salads, or olives from the deli or salad bar at your  store. Stock your pantry so you always have certain foods on hand, such as olive oil, canned tuna, canned tomatoes, rice, pasta, and beans. Cooking  Cook foods with extra-virgin olive oil instead of using butter or other vegetable oils. Have meat as a side dish, and have vegetables or grains as your main dish. This means having meat in small portions or adding small amounts of meat to foods like pasta or stew. Use beans or vegetables instead of meat in common dishes like chili or lasagna. Experiment with different cooking methods. Try roasting or broiling vegetables instead of steaming or sauteing them. Add frozen vegetables to soups, stews, pasta, or rice. Add nuts or seeds for added healthy fat at each meal. You can add these to yogurt, salads, or vegetable dishes. Marinate fish or vegetables using olive oil, lemon juice, garlic, and fresh herbs. Meal planning  Plan to eat 1 vegetarian meal one day each week. Try to work up to 2 vegetarian meals, if possible. Eat seafood 2 or more times a week. Have healthy snacks readily available, such as: Vegetable sticks with hummus. Greek yogurt. Fruit and nut trail mix. Eat balanced meals throughout the week. This includes: Fruit: 2-3 servings a day Vegetables: 4-5 servings a day Low-fat dairy: 2 servings a day Fish, poultry, or lean meat: 1 serving a day Beans and legumes: 2 or more servings a week Nuts and seeds: 1-2 servings a day Whole grains: 6-8 servings a day Extra-virgin olive oil: 3-4 servings a day Limit red meat and sweets to only a few servings a month What are my food choices? Mediterranean diet Recommended Grains: Whole-grain pasta. Brown rice. Bulgar wheat. Polenta. Couscous. Whole-wheat bread. [place]. Vegetables: Artichokes. Beets. Broccoli. Cabbage. Carrots. Eggplant. Green beans. Chard. Kale. Spinach. Onions. Leeks. Peas. Squash. Tomatoes. Peppers. Radishes. Fruits: Apples. Apricots. Avocado. Berries. Bananas.  [place]. Dates. Figs. Grapes. Lemons. Melon. Oranges. Peaches. Plums. Pomegranate. Meats and other protein foods: Beans. Almonds. Sunflower seeds. Pine nuts. Peanuts. [place]. Salmon. Scallops. Shrimp. [place]. Tilapia. Clams. Oysters. Eggs. Dairy: Low-fat milk. Cheese. Greek yogurt. Beverages: Water. Red wine. Herbal tea. Fats and oils: Extra virgin olive oil. Avocado oil. Grape seed oil. Sweets and desserts: [place] yogurt with honey. Baked apples. Poached pears. Trail mix. Seasoning and other foods: Basil. Cilantro. Coriander. Cumin. Mint. Parsley. Sage. Rosemary. Tarragon. Garlic. Oregano. Thyme. Pepper. Balsalmic vinegar. Tahini. Hummus. Tomato sauce. Olives. Mushrooms. Limit these Grains: Prepackaged pasta or rice dishes. Prepackaged cereal with added sugar. Vegetables: Deep fried potatoes (french fries). Fruits: Fruit canned in syrup. Meats and other protein foods: Beef. Pork. Lamb. Poultry with skin. Hot dogs. [place] Salines. Dairy: Ice cream. Sour cream. Whole milk. Beverages: Juice. Sugar-sweetened soft drinks. Beer. Liquor and spirits. Fats and oils: Butter. Canola oil. Vegetable oil. Beef fat (tallow). Lard. Sweets and desserts: Cookies. Cakes. Pies. Candy. Seasoning and other foods: Mayonnaise. Premade sauces and marinades. The items listed may not be a complete list. Talk with your dietitian about what dietary choices are right for you. Summary The Mediterranean diet includes both food and lifestyle choices. Eat a variety of fresh fruits and vegetables, beans,  nuts, seeds, and whole grains. Limit the amount of red meat and sweets that you eat. Talk with your health care provider about whether it is safe for you to drink red wine in moderation. This means 1 glass a day for nonpregnant women and 2 glasses a day for men. A glass of wine equals 5 oz (150 mL). This information is not intended to replace advice given to you by your health care provider. Make sure you discuss any questions you have with  your health care provider. Document Released: 08/16/2015 Document Revised: 09/18/2015 Document Reviewed: 08/16/2015 Elsevier Interactive Patient Education  2017 [employer].

## 2022-03-05 ENCOUNTER — Telehealth: Payer: Self-pay | Admitting: Family Medicine

## 2022-03-05 NOTE — Telephone Encounter (Signed)
Tried to call patient's son and received message that voicemail has not been set up yet. Will have to try and call back later.

## 2022-03-05 NOTE — Telephone Encounter (Signed)
Pt son Dellis Filbert called in would like a call back to discuss change in pt medication #atorvastatin (LIPITOR) 40 MG tablet  please advise [phone]

## 2022-03-06 NOTE — Telephone Encounter (Addendum)
Attempted to contact pt's son, Dellis Filbert (on dpr). No answer. Vm box not set up.

## 2022-03-07 ENCOUNTER — Ambulatory Visit: Payer: Medicare HMO | Admitting: Physician Assistant

## 2022-03-07 MED ORDER — EZETIMIBE 10 MG PO TABS: 10.0000 mg | ORAL_TABLET | Freq: Every day | ORAL | 6 refills | Status: DC

## 2022-03-07 NOTE — Telephone Encounter (Signed)
Pt son called in requesting a call back made him aware that his voicemail is not set up [phone]

## 2022-03-07 NOTE — Telephone Encounter (Signed)
Pt's son, Dellis Filbert rtn call. I relayed Dr. Synthia Innocent message. He verbalizes understanding and agrees to try Zetia for pt.

## 2022-03-07 NOTE — Telephone Encounter (Signed)
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  We can try non-statin medication for cholesterol but statin is usually best option for plaque buildup and to help prevent stroke.  Let me know if they'd like to try zetia instead.

## 2022-03-07 NOTE — Addendum Note (Signed)
Addended by: Ria Bush on: 03/07/2022 04:31 PM   Modules accepted: Orders

## 2022-03-07 NOTE — Telephone Encounter (Signed)
ERx 

## 2022-03-07 NOTE — Telephone Encounter (Signed)
Lvm asking Marilyn Cook to call back. Need to relay Dr. Synthia Innocent message.

## 2022-03-07 NOTE — Telephone Encounter (Signed)
Rtn Jeffrey's call. He wants to know if she can try a non-statin chol med. Says after some research, he saw that it can affect the memory. Plz advise.

## 2022-03-25 ENCOUNTER — Other Ambulatory Visit: Payer: Self-pay | Admitting: Physician Assistant

## 2022-04-06 ENCOUNTER — Other Ambulatory Visit: Payer: Self-pay | Admitting: Family Medicine

## 2022-04-07 ENCOUNTER — Other Ambulatory Visit: Payer: Self-pay | Admitting: Family Medicine

## 2022-05-01 ENCOUNTER — Other Ambulatory Visit: Payer: Self-pay | Admitting: Family Medicine

## 2022-05-01 ENCOUNTER — Other Ambulatory Visit: Payer: Self-pay | Admitting: Physician Assistant

## 2022-05-01 DIAGNOSIS — F411 Generalized anxiety disorder: Secondary | ICD-10-CM

## 2022-05-01 DIAGNOSIS — G459 Transient cerebral ischemic attack, unspecified: Secondary | ICD-10-CM

## 2022-05-01 NOTE — Telephone Encounter (Signed)
Plavix Last filled:  02/06/22, #90 Last OV:  12/25/21, 3 mo anxiety f/u Next OV:  none

## 2022-05-12 ENCOUNTER — Ambulatory Visit (INDEPENDENT_AMBULATORY_CARE_PROVIDER_SITE_OTHER): Payer: Medicare HMO

## 2022-05-12 VITALS — Ht 64.0 in | Wt 110.0 lb

## 2022-05-12 DIAGNOSIS — Z1231 Encounter for screening mammogram for malignant neoplasm of breast: Secondary | ICD-10-CM | POA: Diagnosis not present

## 2022-05-12 DIAGNOSIS — Z0000 Encounter for general adult medical examination without abnormal findings: Secondary | ICD-10-CM

## 2022-05-12 NOTE — Progress Notes (Signed)
I connected with  Marilyn Cook on 05/12/22 by a audio enabled telemedicine application and verified that I am speaking with the correct person using two identifiers.  Patient Location: Home  Provider Location: Home Office  I discussed the limitations of evaluation and management by telemedicine. The patient expressed understanding and agreed to proceed.  Subjective:   Marilyn Cook is a 77 y.o. female who presents for Medicare Annual (Subsequent) preventive examination.  Review of Systems      Cardiac Risk Factors include: advanced age (>64men, >95 women);sedentary lifestyle     Objective:    Today's Vitals   05/12/22 1051  Weight: 110 lb (49.9 kg)  Height: 5\' 4"  (1.626 m)   Body mass index is 18.88 kg/m.     05/12/2022   11:10 AM 03/03/2022    9:13 AM 02/13/2022    7:42 PM 09/02/2021   11:42 AM 08/02/2021    7:58 AM 05/08/2021   11:32 AM 08/10/2020    5:58 PM  Advanced Directives  Does Patient Have a Medical Advance Directive? Yes Yes No Yes Yes Yes Yes  Type of Estate agent of [place];Living will Healthcare Power of [place];Living will   Healthcare Power of [employer] of [place];Living will Healthcare Power of Attorney  Does patient want to make changes to medical advance directive? No - Patient declined        Copy of Healthcare Power of Attorney in Chart? Yes - validated most recent copy scanned in chart (See row information)     Yes - validated most recent copy scanned in chart (See row information)     Current Medications (verified) Outpatient Encounter Medications as of 05/12/2022  Medication Sig   Calcium Carb-Cholecalciferol (CALCIUM-VITAMIN D) 600-400 MG-UNIT TABS Take 1 tablet by mouth 2 (two) times daily.   cetirizine (ZYRTEC) 10 MG tablet Take 10 mg by mouth daily.   clopidogrel (PLAVIX) 75 MG tablet TAKE 1 TABLET BY MOUTH EVERY DAY   cyanocobalamin (VITAMIN B12) 500 MCG tablet Take 1 tablet (500 mcg total) by mouth daily.    divalproex (DEPAKOTE) 125 MG DR tablet TAKE 1 TABLET (125 MG TOTAL) BY MOUTH AT BEDTIME   ezetimibe (ZETIA) 10 MG tablet Take 1 tablet (10 mg total) by mouth daily.   hydrocortisone 2.5 % cream Apply 1 application  topically 2 (two) times daily as needed (itching).   levothyroxine (SYNTHROID) 25 MCG tablet TAKE 1 TABLET BY MOUTH DAILY BEFORE BREAKFAST.   memantine (NAMENDA) 10 MG tablet TAKE 1 TABLET BY MOUTH TWICE A DAY   Potassium 99 MG TABS Take 1 tablet (99 mg total) by mouth daily.   sertraline (ZOLOFT) 25 MG tablet TAKE 1 TABLET (25 MG TOTAL) BY MOUTH DAILY.   triamcinolone (KENALOG) 0.147 MG/GM topical spray Apply topically 2 (two) times daily as needed. For psoriasis   atorvastatin (LIPITOR) 40 MG tablet TAKE 1 TABLET BY MOUTH EVERY DAY (Patient not taking: Reported on 05/12/2022)   clonazePAM (KLONOPIN) 0.5 MG tablet Take 0.5 mg by mouth once. (Patient not taking: Reported on 05/12/2022)   co-enzyme Q-10 30 MG capsule Take 30 mg by mouth daily. (Patient not taking: Reported on 05/12/2022)   Pyridoxine HCl (VITAMIN B-6 PO) Take 1 tablet by mouth daily. (Patient not taking: Reported on 03/03/2022)   No facility-administered encounter medications on file as of 05/12/2022.    Allergies (verified) Aspirin, Diphenhydramine hcl, Donepezil, Egg-derived products, Epinephrine, Latex, Other, Penicillins, and Sulfasalazine   History: Past Medical History:  Diagnosis Date   Abdominal discomfort 10/08/2021   Abnormal urinalysis 02/24/2020   Acute cystitis 10/08/2021   Atherosclerosis of aorta 03/18/2020   By CT 03/2020 and MRI 04/2020   Benign tumor of breast 01/06/1973   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   ?hypothyroid - start levothyroxine daily   Cerebrovascular disease    Moderate per MRI   Chronic right shoulder pain 10/14/2014   Complication of anesthesia    hard to wake up   Compression fracture of thoracic vertebra 02/25/2020   Incidental finding on CXR 02/2020 - T10 compression  fracture    Diverticulosis of colon 10/27/2007   Generalized anxiety disorder 09/25/2021   Hiatal hernia 03/18/2020   Moderate sized with evidence esoph dysmotility or GERD on CT 03/2020   HLD (hyperlipidemia) 06/30/2006   New goal LDL <70 (1//2017)   Hyperlipemia    Lesion of right native kidney 03/18/2020   On CT 03/2020  MRI abd 04/2020 - R exophytic cyst - benign complex Bosniak 2 cyst - no f/u needed    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   DEXA 03/2020 - spine -1.0, R hip neck -1.9 - however dx osteoporosis given h/o vertebral compression fracture on previous CXR, rec rpt 1 yr   Continue cal/vit D daily   First prolia injection 06/25/2020  Latest prolia injection 08/01/2021   Palpitations 03/28/2013   Pulsatile tinnitus of right ear 05/21/2021   Renal insufficiency 10/18/2019   Right-sided low back pain without sciatica 07/13/2021   Scalp psoriasis    TIA (transient ischemic attack) 06/08/2014   Unintentional weight loss 04/13/2014   Vitamin B12 deficiency 08/05/2021   Past Surgical History:  Procedure Laterality Date   ABDOMINAL HYSTERECTOMY     CATARACT EXTRACTION  11/2021   [facility]   COLONOSCOPY  04/07/2015   diverticulosis, int hem, rpt 10 yrs (Nandigam)   DEXA  08/06/2012   spine WNL, hip T -1.2   PARTIAL HYSTERECTOMY  01/06/1985   due to fibroids, ovaries remain   R Lat Epicondyle Injections  01/07/1996   SHOULDER ARTHROSCOPY Right 11/07/2014   arthroscopic debridement and mini open rotator cuff repair of R shoulder  for chronic impingement and rotator cuff tear by MRI Ophelia Charter)   Spiral CT  04/06/2000   (-)   Family History  Problem Relation Age of Onset   Stroke Mother        CVA   Heart disease Father        MI    Stroke Father        CVA   Hypertension Sister    Cancer Sister        ovarian and colon cancer   Aneurysm Brother    Hypertension Brother    Hypertension Brother    Cancer Paternal Grandfather        liver, alcohol   Alcohol abuse Paternal Grandfather    Cancer Maternal Aunt        breast   Dementia Maternal Aunt    Cancer  Paternal Aunt        ? colon   Cancer Other        prostate   Social History   Socioeconomic History   Marital status: Widowed    Spouse name: Not on file   Number of children: 1   Years of education: 12   Highest education level: High school graduate  Occupational History   Occupation: Retired    Associate Professor:     Comment: administrative roles  Tobacco Use   Smoking status: Never   Smokeless tobacco: Never  Building services engineer Use: Never used  Substance and Sexual Activity   Alcohol use: No    Alcohol/week: 0.0 standard drinks of alcohol   Drug use: No   Sexual activity: Yes  Other Topics Concern   Not on file  Social History Narrative   Widow - husband Deniece Portela died from cardiac arrest 02/24/2019   One son   Occupation: retired from working at [employer] at [employer]   Activity: no regular exercise   Diet: some water, fruits/vegetables daily      Advanced directives: discussed - HCPOA unsure will consider this. Needs to set up.   Right handed    One story home    Social Determinants of Health   Financial Resource Strain: Low Risk  (05/12/2022)   Overall Financial Resource Strain (CARDIA)    Difficulty of Paying Living Expenses: Not hard at all  Food Insecurity: No Food Insecurity (05/12/2022)   Hunger Vital Sign    Worried About Running Out of Food in the Last Year: Never true    Ran Out of Food in the Last Year: Never true   Transportation Needs: No Transportation Needs (05/12/2022)   PRAPARE - Administrator, Civil Service (Medical): No    Lack of Transportation (Non-Medical): No  Physical Activity: Inactive (05/12/2022)   Exercise Vital Sign    Days of Exercise per Week: 0 days    Minutes of Exercise per Session: 0 min  Stress: No Stress Concern Present (05/12/2022)   [employer] of Occupational Health - Occupational Stress Questionnaire    Feeling of Stress : Not at all  Social Connections: Moderately Integrated (05/12/2022)   Social Connection and Isolation Panel [NHANES]    Frequency of Communication with Friends and Family: More than three times a week    Frequency of Social Gatherings with Friends and Family: More than three times a week    Attends Religious Services: More than 4 times per year    Active Member of [employer] or Organizations: Yes    Attends Banker Meetings: More than 4 times per year    Marital Status: Widowed    Tobacco Counseling Counseling given: Not Answered   Clinical Intake:  Pre-visit preparation completed: Yes  Pain : No/denies pain     Nutritional Risks: None Diabetes: No  How often do you need to have someone help you when you read instructions, pamphlets, or other written materials from your doctor or pharmacy?: 1 - Never  Diabetic? no  Interpreter Needed?: No  Information entered by :: C.San Lohmeyer LPN   Activities of Daily Living    05/12/2022   11:11 AM  In your present state of health, do you have any difficulty performing the following activities:  Hearing? 0  Vision? 0  Difficulty concentrating or making decisions? 1  Comment occasionally  Walking or climbing stairs? 0  Dressing or bathing? 0  Doing errands, shopping?  1  Comment Family assists  Preparing Food and eating ? N  Using the Toilet? N  In the past six months, have you accidently leaked urine? N  Do you have problems with loss of bowel control? Y  Comment Wears  depends  Managing your Medications? N  Managing your Finances? N  Housekeeping or managing your Housekeeping? N    Patient Care Team: Eustaquio Boyden, MD as PCP - General (Family Medicine)  Indicate any recent Medical Services you may have received from other than [facility] providers in the past year (date may be approximate).     Assessment:   This is a routine wellness examination for [facility].  Hearing/Vision screen Hearing Screening - Comments:: No aids Vision Screening - Comments:: No glasses  Dietary issues and exercise activities discussed: Current Exercise Habits: The patient does not participate in regular exercise at present, Exercise limited by: None identified   Goals Addressed             This Visit's Progress    Patient Stated       Drink more water.       Depression Screen    05/12/2022   11:10 AM 07/12/2021    2:54 PM 05/08/2021   11:25 AM 02/15/2021    2:39 PM 02/17/2020    1:38 PM 08/31/2019    9:11 AM 02/14/2019    2:56 PM  PHQ 2/9 Scores  PHQ - 2 Score 0 1 1 0 0 1 0  PHQ- 9 Score  2   0 4 0    Fall Risk    05/12/2022   11:10 AM 03/03/2022    9:12 AM 09/02/2021   11:42 AM 08/02/2021    7:58 AM 05/08/2021   11:33 AM  Fall Risk   Falls in the past year? 0 0 0 0 1  Number falls in past yr: 0 0 0 0 0  Injury with Fall? 0 0 0 0 0  Risk for fall due to : No Fall Risks    History of fall(s)  Risk for fall due to: Comment     pt states she fell out of bed.  Follow up Falls prevention discussed;Falls evaluation completed;Education provided Falls evaluation completed   Falls prevention discussed    FALL RISK PREVENTION PERTAINING TO THE HOME:  Any stairs in or around the home? Yes  If so, are there any without handrails? No  Home free of loose throw rugs in walkways, pet beds, electrical cords, etc? Yes  Adequate lighting in your home to reduce risk of falls? Yes   ASSISTIVE DEVICES UTILIZED TO PREVENT FALLS:  Life alert? No  Use of a cane, walker or w/c?  No  Grab bars in the bathroom? No  Shower chair or bench in shower? Yes  Elevated toilet seat or a handicapped toilet? Yes   Cognitive Function:    02/17/2020    1:48 PM 02/14/2019    3:01 PM  MMSE - Mini Mental State Exam  Not completed: Unable to complete   Orientation to time  5  Orientation to Place  5  Registration  3  Attention/ Calculation  5  Recall  3  Language- repeat  1      08/02/2021   12:00 PM  Montreal Cognitive Assessment   Visuospatial/ Executive (0/5) 2  Naming (0/3) 2  Attention: Read list of digits (0/2) 2  Attention: Read list of letters (0/1) 1  Attention: Serial 7 subtraction starting at 100 (0/3)  3  Language: Repeat phrase (0/2) 1  Language : Fluency (0/1) 0  Abstraction (0/2) 0  Delayed Recall (0/5) 0  Orientation (0/6) 4  Total 15  Adjusted Score (based on education) 15      05/12/2022   11:12 AM 05/08/2021   11:38 AM  6CIT Screen  What Year? 0 points 0 points  What month? 3 points 0 points  What time? 0 points 0 points  Count back from 20 0 points 0 points  Months in reverse 4 points 0 points  Repeat phrase 10 points 4 points  Total Score 17 points 4 points    Immunizations Immunization History  Administered Date(s) Administered   Fluad Quad(high Dose 65+) 10/07/2018, 09/13/2019, 09/25/2021   Influenza Inj Mdck Quad Pf 10/04/2017   Influenza, High Dose Seasonal PF 11/06/2020   Influenza,inj,Quad PF,6+ Mos 10/08/2015   Influenza-Unspecified 09/07/2014, 10/04/2016   PFIZER(Purple Top)SARS-COV-2 Vaccination 01/25/2019, 02/12/2019   Pneumococcal Conjugate-13 01/23/2015   Pneumococcal Polysaccharide-23 07/31/2011   Td 10/12/2000   Tdap 07/31/2011   Zoster Recombinat (Shingrix) 07/21/2017, 11/07/2017   Zoster, Live 11/23/2015    TDAP status: Due, Education has been provided regarding the importance of this vaccine. Advised may receive this vaccine at local pharmacy or Health Dept. Aware to provide a copy of the vaccination record if  obtained from local pharmacy or Health Dept. Verbalized acceptance and understanding.  Flu Vaccine status: Up to date  Pneumococcal vaccine status: Up to date  Covid-19 vaccine status: Information provided on how to obtain vaccines.   Qualifies for Shingles Vaccine? Yes   Zostavax completed No   Shingrix Completed?: Yes  Screening Tests Health Maintenance  Topic Date Due   MAMMOGRAM  06/29/2021   DTaP/Tdap/Td (3 - Td or Tdap) 07/30/2021   COVID-19 Vaccine (3 - 2023-24 season) 09/06/2021   INFLUENZA VACCINE  08/07/2022   Medicare Annual Wellness (AWV)  05/12/2023   Pneumonia Vaccine 26+ Years old  Completed   DEXA SCAN  Completed   Hepatitis C Screening  Completed   Zoster Vaccines- Shingrix  Completed   HPV VACCINES  Aged Out   COLONOSCOPY (Pts 45-52yrs Insurance coverage will need to be confirmed)  Discontinued    Health Maintenance  Health Maintenance Due  Topic Date Due   MAMMOGRAM  06/29/2021   DTaP/Tdap/Td (3 - Td or Tdap) 07/30/2021   COVID-19 Vaccine (3 - 2023-24 season) 09/06/2021    Colorectal cancer screening: No longer required.   Mammogram status: Ordered 05/12/2022. Pt provided with contact info and advised to call to schedule appt.   Bone Density status: Completed 03/08/20. Results reflect: Bone density results: OSTEOPOROSIS. Repeat every 2 years. Will discuss with PCP  Lung Cancer Screening: (Low Dose CT Chest recommended if Age 42-80 years, 30 pack-year currently smoking OR have quit w/in 15years.) does not qualify.   Lung Cancer Screening Referral: no  Additional Screening:  Hepatitis C Screening: does qualify; Completed 01/30/16  Vision Screening: Recommended annual ophthalmology exams for early detection of glaucoma and other disorders of the eye. Is the patient up to date with their annual eye exam?  Yes  Who is the provider or what is the name of the office in which the patient attends annual eye exams? [facility] [place] If pt is not  established with a provider, would they like to be referred to a provider to establish care? No .   Dental Screening: Recommended annual dental exams for proper oral hygiene  Community Resource Referral / Chronic Care  Management: CRR required this visit?  No   CCM required this visit?  No      Plan:     I have personally reviewed and noted the following in the patient's chart:   Medical and social history Use of alcohol, tobacco or illicit drugs  Current medications and supplements including opioid prescriptions. Patient is not currently taking opioid prescriptions. Functional ability and status Nutritional status Physical activity Advanced directives List of other physicians Hospitalizations, surgeries, and ER visits in previous 12 months Vitals Screenings to include cognitive, depression, and falls Referrals and appointments  In addition, I have reviewed and discussed with patient certain preventive protocols, quality metrics, and best practice recommendations. A written personalized care plan for preventive services as well as general preventive health recommendations were provided to patient.     Maryan Puls, LPN   01/11/1094   Nurse Notes: 6 CIT score 17

## 2022-06-13 ENCOUNTER — Ambulatory Visit
Admission: RE | Admit: 2022-06-13 | Discharge: 2022-06-13 | Disposition: A | Discharge status: Home / Self Care | Payer: Medicare HMO | Source: Ambulatory Visit | Attending: Family Medicine | Admitting: Family Medicine

## 2022-06-13 DIAGNOSIS — Z1231 Encounter for screening mammogram for malignant neoplasm of breast: Secondary | ICD-10-CM

## 2022-08-09 ENCOUNTER — Other Ambulatory Visit: Payer: Self-pay | Admitting: Family Medicine

## 2022-08-09 DIAGNOSIS — F0280 Dementia in other diseases classified elsewhere without behavioral disturbance: Secondary | ICD-10-CM

## 2022-08-09 DIAGNOSIS — E538 Deficiency of other specified B group vitamins: Secondary | ICD-10-CM

## 2022-08-09 DIAGNOSIS — E039 Hypothyroidism, unspecified: Secondary | ICD-10-CM

## 2022-08-09 DIAGNOSIS — E785 Hyperlipidemia, unspecified: Secondary | ICD-10-CM

## 2022-08-09 DIAGNOSIS — M810 Age-related osteoporosis without current pathological fracture: Secondary | ICD-10-CM

## 2022-08-11 ENCOUNTER — Other Ambulatory Visit (INDEPENDENT_AMBULATORY_CARE_PROVIDER_SITE_OTHER): Payer: Medicare HMO

## 2022-08-11 DIAGNOSIS — E785 Hyperlipidemia, unspecified: Secondary | ICD-10-CM | POA: Diagnosis not present

## 2022-08-11 DIAGNOSIS — M810 Age-related osteoporosis without current pathological fracture: Secondary | ICD-10-CM

## 2022-08-11 DIAGNOSIS — E039 Hypothyroidism, unspecified: Secondary | ICD-10-CM

## 2022-08-11 DIAGNOSIS — G309 Alzheimer's disease, unspecified: Secondary | ICD-10-CM | POA: Diagnosis not present

## 2022-08-11 DIAGNOSIS — F0280 Dementia in other diseases classified elsewhere without behavioral disturbance: Secondary | ICD-10-CM

## 2022-08-11 DIAGNOSIS — E538 Deficiency of other specified B group vitamins: Secondary | ICD-10-CM | POA: Diagnosis not present

## 2022-08-11 LAB — CBC WITH DIFFERENTIAL/PLATELET
Basophils Absolute: 0 10*3/uL (ref 0.0–0.1)
Basophils Relative: 0.6 % (ref 0.0–3.0)
Eosinophils Absolute: 0.1 10*3/uL (ref 0.0–0.7)
Eosinophils Relative: 1.3 % (ref 0.0–5.0)
HCT: 42.2 % (ref 36.0–46.0)
Hemoglobin: 13.9 g/dL (ref 12.0–15.0)
Lymphocytes Relative: 23.1 % (ref 12.0–46.0)
Lymphs Abs: 1.1 10*3/uL (ref 0.7–4.0)
MCHC: 33.1 g/dL (ref 30.0–36.0)
MCV: 98.2 fl (ref 78.0–100.0)
Monocytes Absolute: 0.5 10*3/uL (ref 0.1–1.0)
Monocytes Relative: 10.6 % (ref 3.0–12.0)
Neutro Abs: 3.1 10*3/uL (ref 1.4–7.7)
Neutrophils Relative %: 64.4 % (ref 43.0–77.0)
Platelets: 242 10*3/uL (ref 150.0–400.0)
RBC: 4.3 Mil/uL (ref 3.87–5.11)
RDW: 13.3 % (ref 11.5–15.5)
WBC: 4.8 10*3/uL (ref 4.0–10.5)

## 2022-08-11 LAB — COMPREHENSIVE METABOLIC PANEL
ALT: 10 U/L (ref 0–35)
AST: 16 U/L (ref 0–37)
Albumin: 4.2 g/dL (ref 3.5–5.2)
Alkaline Phosphatase: 78 U/L (ref 39–117)
BUN: 10 mg/dL (ref 6–23)
CO2: 31 mEq/L (ref 19–32)
Calcium: 9.2 mg/dL (ref 8.4–10.5)
Chloride: 103 mEq/L (ref 96–112)
Creatinine, Ser: 0.77 mg/dL (ref 0.40–1.20)
GFR: 74.17 mL/min (ref >60.00)
Glucose, Bld: 99 mg/dL (ref 70–99)
Potassium: 3.9 mEq/L (ref 3.5–5.1)
Sodium: 143 mEq/L (ref 135–145)
Total Bilirubin: 0.4 mg/dL (ref 0.2–1.2)
Total Protein: 6.9 g/dL (ref 6.0–8.3)

## 2022-08-11 LAB — LIPID PANEL
Cholesterol: 236 mg/dL — ABNORMAL HIGH (ref 0–200)
HDL: 52.2 mg/dL (ref >39.00)
LDL Cholesterol: 152 mg/dL — ABNORMAL HIGH (ref 0–99)
NonHDL: 183.87
Total CHOL/HDL Ratio: 5
Triglycerides: 157 mg/dL — ABNORMAL HIGH (ref 0.0–149.0)
VLDL: 31.4 mg/dL (ref 0.0–40.0)

## 2022-08-11 LAB — T4, FREE: Free T4: 0.7 ng/dL (ref 0.60–1.60)

## 2022-08-11 LAB — VITAMIN B12: Vitamin B-12: 490 pg/mL (ref 211–911)

## 2022-08-11 LAB — VITAMIN D 25 HYDROXY (VIT D DEFICIENCY, FRACTURES): VITD: 35.64 ng/mL (ref 30.00–100.00)

## 2022-08-11 LAB — TSH: TSH: 5.3 u[IU]/mL (ref 0.35–5.50)

## 2022-08-18 ENCOUNTER — Encounter: Payer: Self-pay | Admitting: Family Medicine

## 2022-08-18 ENCOUNTER — Telehealth: Payer: Self-pay | Admitting: Family Medicine

## 2022-08-18 ENCOUNTER — Ambulatory Visit (INDEPENDENT_AMBULATORY_CARE_PROVIDER_SITE_OTHER): Payer: Medicare HMO | Admitting: Family Medicine

## 2022-08-18 VITALS — BP 138/82 | HR 73 | Temp 97.7°F | Ht 61.75 in | Wt 133.2 lb

## 2022-08-18 DIAGNOSIS — E538 Deficiency of other specified B group vitamins: Secondary | ICD-10-CM

## 2022-08-18 DIAGNOSIS — Z7189 Other specified counseling: Secondary | ICD-10-CM

## 2022-08-18 DIAGNOSIS — M810 Age-related osteoporosis without current pathological fracture: Secondary | ICD-10-CM | POA: Diagnosis not present

## 2022-08-18 DIAGNOSIS — L409 Psoriasis, unspecified: Secondary | ICD-10-CM | POA: Diagnosis not present

## 2022-08-18 DIAGNOSIS — E785 Hyperlipidemia, unspecified: Secondary | ICD-10-CM

## 2022-08-18 DIAGNOSIS — Z0000 Encounter for general adult medical examination without abnormal findings: Secondary | ICD-10-CM | POA: Diagnosis not present

## 2022-08-18 DIAGNOSIS — G459 Transient cerebral ischemic attack, unspecified: Secondary | ICD-10-CM | POA: Diagnosis not present

## 2022-08-18 DIAGNOSIS — G309 Alzheimer's disease, unspecified: Secondary | ICD-10-CM

## 2022-08-18 DIAGNOSIS — F0280 Dementia in other diseases classified elsewhere without behavioral disturbance: Secondary | ICD-10-CM | POA: Diagnosis not present

## 2022-08-18 DIAGNOSIS — F411 Generalized anxiety disorder: Secondary | ICD-10-CM | POA: Diagnosis not present

## 2022-08-18 DIAGNOSIS — R634 Abnormal weight loss: Secondary | ICD-10-CM | POA: Diagnosis not present

## 2022-08-18 DIAGNOSIS — E039 Hypothyroidism, unspecified: Secondary | ICD-10-CM

## 2022-08-18 MED ORDER — TRIAMCINOLONE ACETONIDE 0.147 MG/GM EX AERS: INHALATION_SPRAY | Freq: Two times a day (BID) | CUTANEOUS | 3 refills | Status: DC | PRN

## 2022-08-18 MED ORDER — SERTRALINE HCL 25 MG PO TABS: 25.0000 mg | ORAL_TABLET | Freq: Every day | ORAL | 4 refills | Status: DC

## 2022-08-18 MED ORDER — ATORVASTATIN CALCIUM 40 MG PO TABS: 40.0000 mg | ORAL_TABLET | Freq: Every day | ORAL | 4 refills | Status: DC

## 2022-08-18 MED ORDER — CLOPIDOGREL BISULFATE 75 MG PO TABS: 75.0000 mg | ORAL_TABLET | Freq: Every day | ORAL | 4 refills | Status: DC

## 2022-08-18 MED ORDER — LEVOTHYROXINE SODIUM 25 MCG PO TABS: 25.0000 ug | ORAL_TABLET | Freq: Every day | ORAL | 4 refills | Status: DC

## 2022-08-18 NOTE — Assessment & Plan Note (Signed)
Overdue for prolia - last injection given 07/2021.  Will ask to re-price out Prolia.  Not good candidate for weekly fosamax due to complicated optimal administration strategy.  Son should be called to schedule all appts including Prolia nurse visit.

## 2022-08-18 NOTE — Assessment & Plan Note (Signed)
Has regained weight now that son is following nutrition more closely.

## 2022-08-18 NOTE — Patient Instructions (Addendum)
We will re-price out prolia shot.  Consider RSV shot through local pharmacy Bring [place] copy of your living will.  Cholesterol levels were worse - stop ezetimibe (zetia), restart atorvastatin 40mg  daily sent to pharmacy.  Good to see you today  Return as needed or in 6 months for follow up visit.

## 2022-08-18 NOTE — Assessment & Plan Note (Signed)
Continue low dose sertraline 25mg  which she tolerates well.

## 2022-08-18 NOTE — Assessment & Plan Note (Signed)
TSH 5, fT4 normal - continue low dose levothyroxine.

## 2022-08-18 NOTE — Assessment & Plan Note (Signed)
Followed by neurology on namenda, excelon patch, and now depakote. She also continues sertraline 25mg  for anxiety.

## 2022-08-18 NOTE — Progress Notes (Signed)
Ph: [phone] Fax: [phone]   Patient ID: Marilyn Cook, female    DOB: Apr 27, 1945, 77 y.o.   MRN: [id number]  This visit was conducted in person.  BP 138/82   Pulse 73   Temp 97.7 F (36.5 C) (Temporal)   Ht 5' 1.75" (1.568 m)   Wt 133 lb 4 oz (60.4 kg)   SpO2 99%   BMI 24.57 kg/m    CC: CPE Subjective:   HPI: Marilyn Cook is a 77 y.o. female presenting on 08/18/2022 for Annual Exam (MCR prt 2 [AWV- 05/12/22]. Pt accompanied by son, Tinnie Gens.)   Saw health advisor 05/2022 for medicare wellness visit. Note reviewed. Failed cognitive assessment, known dementia.     05/12/2022   11:12 AM 05/08/2021   11:38 AM  6CIT Screen  What Year? 0 points 0 points  What month? 3 points 0 points  What time? 0 points 0 points  Count back from 20 0 points 0 points  Months in reverse 4 points 0 points  Repeat phrase 10 points 4 points  Total Score 17 points 4 points    No results found.  Flowsheet Row Clinical Support from 05/12/2022 in [facility] [place]  PHQ-2 Total Score 0          05/12/2022   11:10 AM 03/03/2022    9:12 AM 09/02/2021   11:42 AM 08/02/2021    7:58 AM 05/08/2021   11:33 AM  Fall Risk   Falls in the past year? 0 0 0 0 1  Number falls in past yr: 0 0 0 0 0  Injury with Fall? 0 0 0 0 0  Risk for fall due to : No Fall Risks    History of fall(s)  Risk for fall due to: Comment     pt states she fell out of bed.  Follow up Falls prevention discussed;Falls evaluation completed;Education provided Falls evaluation completed   Falls prevention discussed     Lost husband Deniece Portela 02/2019. Son [name] lives next door.  Mixed alzheimer's and vascular dementia on excelon 3mg  BID patch and namenda 10mg  bid, followed by neurology.  Saw Dr Milbert Coulter 12/2021 neuropsychological testing - testing more consistent with alzheimer's disease. She has experienced visual hallucinations, she did complete bilat cataract surgery. She is now on depakote 125mg  at bedtime.  Weight gain noted since starting depakote.  She also continues b12 daily.   Continues sertraline 25mg  daily - with good effect on anxiety.   New scaly rash to scalp - manages with triamcinolone spray with benefit.    Preventative: COLONOSCOPY 04/2015 diverticulosis, int hem, rpt 10 yrs (Nandigam) - age out Pap smear - Partial hysterectomy for benign reason (fibroids), ovaries remain. Bimanual only done 2015. Denies pelvic pain or pressure.  Mammogram 06/2022 - Birads1 @ [facility]  DEXA 03/2020 - spine -1.0, R hip neck -1.9 - however dx osteoporosis given h/o vertebral compression fracture on previous CXR. Started prolia 06/2020, last injection 08/01/2021, missed 02/2022 dose.  Flubloc yearly (egg allergy)  Covid vaccine Pfizer 01/2019, 02/2019, no booster Pneumovax 07/31/2011, prevnar-13 01/2015  Tdap 07/2011  zostavax - 11/2015  shingrix - 07/2017, 11/2017  RSV - discussed  Advanced directives: has this at home, will bring [place] copy.  HCPOA is son.  Seat belt use discussed Sunscreen use discussed - allergic to sunscreen. No changing moles on skin. Sees dermatologist yearly.  Non smoker Alcohol - none Dentist - q6 mo Eye exam yearly - [facility]  Bowel - no constipation  Bladder - no incontinence   Widow - husband Deniece Portela died from cardiac arrest March 15, 2019 One son [name]) Occupation: worked at [employer] at [employer] - retired 03/2017 Activity: no regular exercise, active through rehab Diet: some water, fruits/vegetables daily     Relevant past medical, surgical, family and social history reviewed and updated as indicated. Interim medical history since our last visit reviewed. Allergies and medications reviewed and updated. Outpatient Medications Prior to Visit  Medication Sig Dispense Refill   Calcium Carb-Cholecalciferol (CALCIUM-VITAMIN D) 600-400 MG-UNIT TABS Take 1 tablet by mouth 2 (two) times daily.     cetirizine (ZYRTEC) 10 MG tablet Take 10 mg by mouth daily.     clonazePAM (KLONOPIN)  0.5 MG tablet Take 0.5 mg by mouth once.     co-enzyme Q-10 30 MG capsule Take 30 mg by mouth daily.     cyanocobalamin (VITAMIN B12) 500 MCG tablet Take 1 tablet (500 mcg total) by mouth daily.     divalproex (DEPAKOTE) 125 MG DR tablet TAKE 1 TABLET (125 MG TOTAL) BY MOUTH AT BEDTIME 90 tablet 1   hydrocortisone 2.5 % cream Apply 1 application  topically 2 (two) times daily as needed (itching).     memantine (NAMENDA) 10 MG tablet TAKE 1 TABLET BY MOUTH TWICE A DAY 180 tablet 1   Potassium 99 MG TABS Take 1 tablet (99 mg total) by mouth daily.     Pyridoxine HCl (VITAMIN B-6 PO) Take 1 tablet by mouth daily.     atorvastatin (LIPITOR) 40 MG tablet TAKE 1 TABLET BY MOUTH EVERY DAY 90 tablet 3   clopidogrel (PLAVIX) 75 MG tablet TAKE 1 TABLET BY MOUTH EVERY DAY 90 tablet 3   ezetimibe (ZETIA) 10 MG tablet Take 1 tablet (10 mg total) by mouth daily. 30 tablet 6   levothyroxine (SYNTHROID) 25 MCG tablet TAKE 1 TABLET BY MOUTH DAILY BEFORE BREAKFAST. 90 tablet 3   sertraline (ZOLOFT) 25 MG tablet TAKE 1 TABLET (25 MG TOTAL) BY MOUTH DAILY. 90 tablet 0   triamcinolone (KENALOG) 0.147 MG/GM topical spray Apply topically 2 (two) times daily as needed. For psoriasis 63 g 1   No facility-administered medications prior to visit.     Per HPI unless specifically indicated in ROS section below Review of Systems  Unable to perform ROS: Dementia    Objective:  BP 138/82   Pulse 73   Temp 97.7 F (36.5 C) (Temporal)   Ht 5' 1.75" (1.568 m)   Wt 133 lb 4 oz (60.4 kg)   SpO2 99%   BMI 24.57 kg/m   Wt Readings from Last 3 Encounters:  08/18/22 133 lb 4 oz (60.4 kg)  05/12/22 110 lb (49.9 kg)  03/03/22 117 lb 12.8 oz (53.4 kg)      Physical Exam Vitals and nursing note reviewed.  Constitutional:      Appearance: Normal appearance. She is not ill-appearing.  HENT:     Head: Normocephalic and atraumatic.     Right Ear: Tympanic membrane, ear canal and external ear normal. There is no impacted  cerumen.     Left Ear: Tympanic membrane, ear canal and external ear normal. There is no impacted cerumen.     Nose: Nose normal.     Mouth/Throat:     Mouth: Mucous membranes are moist.     Pharynx: Oropharynx is clear. No oropharyngeal exudate or posterior oropharyngeal erythema.  Eyes:     General:  Right eye: No discharge.        Left eye: No discharge.     Extraocular Movements: Extraocular movements intact.     Conjunctiva/sclera: Conjunctivae normal.     Pupils: Pupils are equal, round, and reactive to light.  Neck:     Thyroid: No thyroid mass or thyromegaly.  Cardiovascular:     Rate and Rhythm: Normal rate and regular rhythm.     Pulses: Normal pulses.     Heart sounds: Normal heart sounds. No murmur heard. Pulmonary:     Effort: Pulmonary effort is normal. No respiratory distress.     Breath sounds: Normal breath sounds. No wheezing, rhonchi or rales.  Abdominal:     General: Bowel sounds are normal. There is no distension.     Palpations: Abdomen is soft. There is no mass.     Tenderness: There is no abdominal tenderness. There is no guarding or rebound.     Hernia: No hernia is present.  Musculoskeletal:     Cervical back: Normal range of motion and neck supple. No rigidity.     Right lower leg: No edema.     Left lower leg: No edema.  Lymphadenopathy:     Cervical: No cervical adenopathy.  Skin:    General: Skin is warm and dry.     Findings: Erythema and rash present.     Comments: Dry skin to R temporal scalp as well as bilateral occipital region with scaly rash  Neurological:     General: No focal deficit present.     Mental Status: She is alert. Mental status is at baseline.  Psychiatric:        Mood and Affect: Mood normal.        Behavior: Behavior normal.       Results for orders placed or performed in visit on 08/11/22  CBC with Differential/Platelet  Result Value Ref Range   WBC 4.8 4.0 - 10.5 K/uL   RBC 4.30 3.87 - 5.11 Mil/uL    Hemoglobin 13.9 12.0 - 15.0 g/dL   HCT 29.5 28.4 - 13.2 %   MCV 98.2 78.0 - 100.0 fl   MCHC 33.1 30.0 - 36.0 g/dL   RDW 44.0 10.2 - 72.5 %   Platelets 242.0 150.0 - 400.0 K/uL   Neutrophils Relative % 64.4 43.0 - 77.0 %   Lymphocytes Relative 23.1 12.0 - 46.0 %   Monocytes Relative 10.6 3.0 - 12.0 %   Eosinophils Relative 1.3 0.0 - 5.0 %   Basophils Relative 0.6 0.0 - 3.0 %   Neutro Abs 3.1 1.4 - 7.7 K/uL   Lymphs Abs 1.1 0.7 - 4.0 K/uL   Monocytes Absolute 0.5 0.1 - 1.0 K/uL   Eosinophils Absolute 0.1 0.0 - 0.7 K/uL   Basophils Absolute 0.0 0.0 - 0.1 K/uL  VITAMIN D 25 Hydroxy (Vit-D Deficiency, Fractures)  Result Value Ref Range   VITD 35.64 30.00 - 100.00 ng/mL  Vitamin B12  Result Value Ref Range   Vitamin B-12 490 211 - 911 pg/mL  TSH  Result Value Ref Range   TSH 5.30 0.35 - 5.50 uIU/mL  T4, free  Result Value Ref Range   Free T4 0.70 0.60 - 1.60 ng/dL  Comprehensive metabolic panel  Result Value Ref Range   Sodium 143 135 - 145 mEq/L   Potassium 3.9 3.5 - 5.1 mEq/L   Chloride 103 96 - 112 mEq/L   CO2 31 19 - 32 mEq/L   Glucose, Bld 99 70 -  99 mg/dL   BUN 10 6 - 23 mg/dL   Creatinine, Ser 4.69 0.40 - 1.20 mg/dL   Total Bilirubin 0.4 0.2 - 1.2 mg/dL   Alkaline Phosphatase 78 39 - 117 U/L   AST 16 0 - 37 U/L   ALT 10 0 - 35 U/L   Total Protein 6.9 6.0 - 8.3 g/dL   Albumin 4.2 3.5 - 5.2 g/dL   GFR 62.95 >28.41 mL/min   Calcium 9.2 8.4 - 10.5 mg/dL  Lipid panel  Result Value Ref Range   Cholesterol 236 (H) 0 - 200 mg/dL   Triglycerides 324.4 (H) 0.0 - 149.0 mg/dL   HDL 01.02 >72.53 mg/dL   VLDL 66.4 0.0 - 40.3 mg/dL   LDL Cholesterol 474 (H) 0 - 99 mg/dL   Total CHOL/HDL Ratio 5    NonHDL 183.87     Assessment & Plan:   Problem List Items Addressed This Visit     Health maintenance examination - Primary (Chronic)    Preventative protocols reviewed and updated unless pt declined. Discussed healthy diet and lifestyle.       Advanced directives,  counseling/discussion (Chronic)    Advanced directives: has this at home, will bring [place] copy.  HCPOA is son.       HLD (hyperlipidemia)    Chronic, deteriorated on zetia. Will change back to atorvastatin 40mg  daily.  The 10-year ASCVD risk score (Arnett DK, et al., 2019) is: 22.4%   Values used to calculate the score:     Age: 68 years     Sex: Female     Is Non-Hispanic African American: No     Diabetic: No     Tobacco smoker: No     Systolic Blood Pressure: 138 mmHg     Is BP treated: No     HDL Cholesterol: 52.2 mg/dL     Total Cholesterol: 236 mg/dL       Relevant Medications   atorvastatin (LIPITOR) 40 MG tablet   Scalp psoriasis    TCI spray refilled.       Unintentional weight loss    Has regained weight now that son is following nutrition more closely.       TIA (transient ischemic attack)    Continue plavix. Change ezetimibe back to atorvastatin      Relevant Medications   atorvastatin (LIPITOR) 40 MG tablet   clopidogrel (PLAVIX) 75 MG tablet   Osteoporosis    Overdue for prolia - last injection given 07/2021.  Will ask to re-price out Prolia.  Not good candidate for weekly fosamax due to complicated optimal administration strategy.  Son should be called to schedule all appts including Prolia nurse visit.       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 She also continues sertraline 25mg  for anxiety.       Relevant Medications   sertraline (ZOLOFT) 25 MG tablet   Borderline hypothyroidism    TSH 5, fT4 normal - continue low dose levothyroxine.      Relevant Medications   levothyroxine (SYNTHROID) 25 MCG tablet   Vitamin B12 deficiency    Continue oral OTC B12 replacement.       Generalized anxiety disorder    Continue low dose sertraline 25mg  which she tolerates well.       Relevant Medications   sertraline (ZOLOFT) 25 MG tablet     Meds ordered this encounter  Medications  sertraline (ZOLOFT) 25 MG tablet    Sig: Take 1 tablet (25 mg total) by mouth daily.    Dispense:  90 tablet    Refill:  4   triamcinolone (KENALOG) 0.147 MG/GM topical spray    Sig: Apply topically 2 (two) times daily as needed (psoriasis rash on scalp).    Dispense:  63 g    Refill:  3    Ok to do generic.   atorvastatin (LIPITOR) 40 MG tablet    Sig: Take 1 tablet (40 mg total) by mouth daily.    Dispense:  90 tablet    Refill:  4   clopidogrel (PLAVIX) 75 MG tablet    Sig: Take 1 tablet (75 mg total) by mouth daily.    Dispense:  90 tablet    Refill:  4   levothyroxine (SYNTHROID) 25 MCG tablet    Sig: Take 1 tablet (25 mcg total) by mouth daily before breakfast.    Dispense:  90 tablet    Refill:  4    No orders of the defined types were placed in this encounter.   Patient Instructions  We will re-price out prolia shot.  Consider RSV shot through local pharmacy Bring [place] copy of your living will.  Cholesterol levels were worse - stop ezetimibe (zetia), restart atorvastatin 40mg  daily sent to pharmacy.  Good to see you today  Return as needed or in 6 months for follow up visit.   Follow up plan: Return in about 6 months (around 02/18/2023) for follow up visit.  Eustaquio Boyden, MD

## 2022-08-18 NOTE — Assessment & Plan Note (Signed)
Advanced directives: has this at home, will bring [place] copy.  HCPOA is son.

## 2022-08-18 NOTE — Assessment & Plan Note (Signed)
Preventative protocols reviewed and updated unless pt declined. Discussed healthy diet and lifestyle.  

## 2022-08-18 NOTE — Assessment & Plan Note (Signed)
Continue oral OTC B12 replacement.

## 2022-08-18 NOTE — Telephone Encounter (Signed)
Can we re-price out Prolia for patient? Last shot was 07/2021 Thank you!

## 2022-08-18 NOTE — Assessment & Plan Note (Signed)
Chronic, deteriorated on zetia. Will change back to atorvastatin 40mg  daily.  The 10-year ASCVD risk score (Arnett DK, et al., 2019) is: 22.4%   Values used to calculate the score:     Age: 77 years     Sex: Female     Is Non-Hispanic African American: No     Diabetic: No     Tobacco smoker: No     Systolic Blood Pressure: 138 mmHg     Is BP treated: No     HDL Cholesterol: 52.2 mg/dL     Total Cholesterol: 236 mg/dL

## 2022-08-18 NOTE — Assessment & Plan Note (Signed)
TCI spray refilled.

## 2022-08-18 NOTE — Assessment & Plan Note (Signed)
Continue plavix. Change ezetimibe back to atorvastatin

## 2022-08-25 ENCOUNTER — Telehealth: Payer: Self-pay

## 2022-08-25 ENCOUNTER — Other Ambulatory Visit (HOSPITAL_COMMUNITY): Payer: Self-pay

## 2022-08-25 NOTE — Telephone Encounter (Signed)
Created new encounter for Prolia BIV. Will route encounter back once benefit verification is complete.  

## 2022-08-25 NOTE — Telephone Encounter (Signed)
Prolia VOB initiated via [email or web address] 

## 2022-08-27 ENCOUNTER — Other Ambulatory Visit: Payer: Self-pay | Admitting: Family Medicine

## 2022-08-27 NOTE — Telephone Encounter (Signed)
Pharmacy Patient Advocate Encounter   Received notification from  [employer] Portal that prior authorization for Prolia is required/requested.   Insurance verification completed.   The patient is insured through [employer] .   Per test claim: PA required; PA submitted to [employer] via Av[employer] Key/confirmation #/[id number] Status is pending

## 2022-09-01 NOTE — Telephone Encounter (Signed)
Received approval notice by insurance - will forward to [employer]

## 2022-09-04 NOTE — Progress Notes (Signed)
Assessment/Plan:   Dementia likely due to Alzheimer's Disease with behavioral disturbance  Marilyn Cook is a very pleasant 77 y.o. RH female with a history of hypertension, hyperlipidemia, TIA 2017 and a diagnosis of dementia due to Alzheimer's disease as per neuropsychological evaluation on 12/19/2021 seen today in follow up for memory loss. Patient is currently o memantine 10 mg twice a day. She did not tolerate [facility] in the past (GI). Memory is stable. Mood is controlled. Hallucinations are present during the day. She is on Depakote at night, dicussed with her son increasing to bid, son agrees.      Follow up in 6  months. Increase Depakote 125 mg to  twice daily for visual hallucinations and sundowning Continue memantine 10 mg twice daily Continue B12 supplements Recommend good control of her cardiovascular risk factors Continue to control mood as per PCP, he is on Zoloft daily     Subjective:    This patient is accompanied in the office by her son who supplements the history.  Previous records as well as any outside records available were reviewed prior to todays visit. Patient was last seen on 03/03/2022.   Any changes in memory since last visit? "About the same".  She is able to remember people's names but not as good as before. She has little insight to her condition.  repeats oneself?  Endorsed, frequently Disoriented when walking into a room?  Patient denies. She no longer "teleports to [place], after increasing the dose of memantine"-son says  Leaving objects in unusual places?  May misplace things but not in unusual places, "she has always been a packrat, she may move the stuff but will not throw stuff, she hoards some. She collect straws, vitamins, plastic forks and knives.'   Wandering behavior?  denies   Any personality changes since last visit?  Denies.   Any worsening depression?:  Denies.   Hallucinations or paranoia? Endorsed."Woman is the trees backed off, now  there is a man in the window which bothers me " Seizures? denies    Any sleep changes?  Sleeps well. Sometimes she has vivid dreams, REM behavior or sleepwalking. Likes to go to sleep with her clothes on.  Sleep apnea?   Denies.   Any hygiene concerns? Denies. He may forget to shower.  Independent of bathing and dressing?  Endorsed  Does the patient needs help with medications?  Son is in charge Who is in charge of the finances?  Son is in charge   Any changes in appetite?  Denies.    Patient have trouble swallowing? Denies.   Does the patient cook? No Any headaches?   denies   Chronic back pain  denies   Ambulates with difficulty? Denies.   Recent falls or head injuries? denies     Unilateral weakness, numbness or tingling? denies   Any tremors?  Denies   Any anosmia?  Denies   Any incontinence of urine?  Denies  Any bowel dysfunction?   Denies      Patient lives alone but son lives next door. Does the patient drive? No longer drives      Initial evaluation 07/25/2021 the patient is accompanied by her son who supplements the history.     How long did patient have memory difficulties? She denies any memory changes, but her son reports that since 2022-02-28 after her husband died. Son was concerned because about 1 month ago she did not remember the 4 digits of the house alarm "was  trying to do 3 digits, but later that afternoon, she remembered that there were 4 digits".  "She used to be a whiz with numbers, not now ".  Most of the concerns are due to short-term memory.  There was an episode recently, where her son told the story, and immediately, she repeated the story without recall that her son had just said the same thing. Patient lives with:  Patient lives alone after losing her husband but her son lives next door repeats oneself?  "Over and over for years, but we always joked about it, never made much of it ".  Disoriented when walking into a room?  Patient denies   Leaving objects in  unusual places?  Patient denies   Ambulates  with difficulty?   Patient denies   Recent falls?  Patient denies   Any head injuries?  Patient denies   History of seizures?   Patient denies. She was evaluated in 2017 for transient alteration of awareness but it was negative for seizures. Wandering behavior?  Patient denies   Patient drives?   Denies any issues, tries to stay off of the highway.  Son reports that she is a very good driver to date. Any mood changes such irritability agitation?  Denies  Any history of depression?:  Patient denies   Hallucinations? Son reports she sees things, like a light at nigh and a ring around it, but one da instead of basketball, she thought she saw a person.  Sometimes she states is shaped like a leaf, but she thinks it is an Agricultural consultant.  Her son is not sure if this is an hallucination or is distorted image because of her history of glaucoma.  If she senses that is something moving in the woods, she is afraid to go outside. Paranoia?  "She was paranoid about a neighbor for a while, but even after his death, and other people moving into that house she is still gets anxious about passing by that house ".  She is also afraid of the darkness, and does not like to go downstairs to the laundry room, so she washes all her clothing by hand although she is working on it ".   Patient reports that he sleeps well without vivid dreams, REM behavior or sleepwalking   History of sleep apnea?  Patient denies   Any hygiene concerns?  Denies Independent of bathing and dressing?  Endorsed  Does the patient needs help with medications? Son monitors, she has a pill tray  Who is in charge of the finances?  Patient is in charge but son monitors, she doesn't like [employer] so she would not pay so she got behind.  "She doesn't like to spend money ". Any changes in appetite?  "Never has been a good eater ".  Patient have trouble swallowing? Patient denies   Does the patient cook?   Patient denies   Any kitchen accidents such as leaving the stove on? Patient denies   Any headaches?  Patient denies   Double vision? Patient denies   Any focal numbness or tingling?  Patient denies   Chronic back pain Patient denies   Unilateral weakness?  Patient denies   Any tremors?  Patient denies   Any history of anosmia?  Patient denies   Any incontinence of urine?  Patient denies   Any bowel dysfunction?   Patient denies    History of heavy alcohol intake?  Patient denies   History of heavy tobacco use?  Patient  denies   Family history of dementia?   Aunt with dementia   PREVIOUS MEDICATIONS:   CURRENT MEDICATIONS:  Outpatient Encounter Medications as of 09/05/2022  Medication Sig   atorvastatin (LIPITOR) 40 MG tablet Take 1 tablet (40 mg total) by mouth daily.   Calcium Carb-Cholecalciferol (CALCIUM-VITAMIN D) 600-400 MG-UNIT TABS Take 1 tablet by mouth 2 (two) times daily.   cetirizine (ZYRTEC) 10 MG tablet Take 10 mg by mouth daily.   clonazePAM (KLONOPIN) 0.5 MG tablet Take 0.5 mg by mouth once.   clopidogrel (PLAVIX) 75 MG tablet Take 1 tablet (75 mg total) by mouth daily.   co-enzyme Q-10 30 MG capsule Take 30 mg by mouth daily.   cyanocobalamin (VITAMIN B12) 500 MCG tablet Take 1 tablet (500 mcg total) by mouth daily.   hydrocortisone 2.5 % cream Apply 1 application  topically 2 (two) times daily as needed (itching).   levothyroxine (SYNTHROID) 25 MCG tablet Take 1 tablet (25 mcg total) by mouth daily before breakfast.   Potassium 99 MG TABS Take 1 tablet (99 mg total) by mouth daily.   Pyridoxine HCl (VITAMIN B-6 PO) Take 1 tablet by mouth daily.   sertraline (ZOLOFT) 25 MG tablet Take 1 tablet (25 mg total) by mouth daily.   triamcinolone (KENALOG) 0.147 MG/GM topical spray Apply topically 2 (two) times daily as needed (psoriasis rash on scalp).   [DISCONTINUED] divalproex (DEPAKOTE) 125 MG DR tablet TAKE 1 TABLET (125 MG TOTAL) BY MOUTH AT BEDTIME   [DISCONTINUED]  memantine (NAMENDA) 10 MG tablet TAKE 1 TABLET BY MOUTH TWICE A DAY   divalproex (DEPAKOTE) 125 MG DR tablet Take 1 tablet (125 mg total) by mouth 2 (two) times daily.   memantine (NAMENDA) 10 MG tablet TAKE 1 TABLET BY MOUTH TWICE A DAY   No facility-administered encounter medications on file as of 09/05/2022.       02/17/2020    1:48 PM 02/14/2019    3:01 PM  MMSE - Mini Mental State Exam  Not completed: Unable to complete   Orientation to time  5  Orientation to Place  5  Registration  3  Attention/ Calculation  5  Recall  3  Language- repeat  1      08/02/2021   12:00 PM  Montreal Cognitive Assessment   Visuospatial/ Executive (0/5) 2  Naming (0/3) 2  Attention: Read list of digits (0/2) 2  Attention: Read list of letters (0/1) 1  Attention: Serial 7 subtraction starting at 100 (0/3) 3  Language: Repeat phrase (0/2) 1  Language : Fluency (0/1) 0  Abstraction (0/2) 0  Delayed Recall (0/5) 0  Orientation (0/6) 4  Total 15  Adjusted Score (based on education) 15    Objective:     PHYSICAL EXAMINATION:    VITALS:   Vitals:   09/05/22 0921  BP: 122/64  Pulse: 77  SpO2: 90%  Weight: 133 lb (60.3 kg)  Height: 5' 1.75" (1.568 m)    GEN:  The patient appears stated age and is in NAD. HEENT:  Normocephalic, atraumatic.   Neurological examination:  General: NAD, well-groomed, appears stated age. Orientation: The patient is alert. Oriented to person, place and date. Cranial nerves: There is good facial symmetry.The speech is fluent and clear. No aphasia or dysarthria. Fund of knowledge is reduced. Recent and remote memory are impaired. Attention and concentration are reduced.  Able to name objects and repeat phrases.  Hearing is intact to conversational tone. Sensation: Sensation is intact to light  touch throughout Motor: Strength is at least antigravity x4. DTR's 2/4 in UE/LE     Movement examination: Tone: There is normal tone in the UE/LE Abnormal movements:   no tremor.  No myoclonus.  No asterixis.   Coordination:  There is no decremation with RAM's. Normal finger to nose  Gait and Station: The patient has no difficulty arising out of a deep-seated chair without the use of the hands. The patient's stride length is good.  Gait is cautious and narrow.    Thank you for allowing [place] the opportunity to participate in the care of this nice patient. Please do not hesitate to contact us for any questions or concerns.   Total time spent on today's visit was 33 minutes dedicated to this patient today, preparing to see patient, examining the patient, ordering tests and/or medications and counseling the patient, documenting clinical information in the EHR or other health record, independently interpreting results and communicating results to the patient/family, discussing treatment and goals, answering patient's questions and coordinating care.  Cc:  Eustaquio Boyden, MD  Marlowe Kays 09/05/2022 9:45 AM

## 2022-09-05 ENCOUNTER — Ambulatory Visit: Payer: Medicare HMO | Admitting: Physician Assistant

## 2022-09-05 ENCOUNTER — Other Ambulatory Visit (HOSPITAL_COMMUNITY): Payer: Self-pay

## 2022-09-05 ENCOUNTER — Encounter: Payer: Self-pay | Admitting: Physician Assistant

## 2022-09-05 VITALS — BP 122/64 | HR 77 | Ht 61.75 in | Wt 133.0 lb

## 2022-09-05 DIAGNOSIS — F0280 Dementia in other diseases classified elsewhere without behavioral disturbance: Secondary | ICD-10-CM

## 2022-09-05 DIAGNOSIS — G309 Alzheimer's disease, unspecified: Secondary | ICD-10-CM | POA: Diagnosis not present

## 2022-09-05 MED ORDER — MEMANTINE HCL 10 MG PO TABS: ORAL_TABLET | ORAL | 1 refills | Status: DC

## 2022-09-05 MED ORDER — DIVALPROEX SODIUM 125 MG PO DR TAB: 125.0000 mg | DELAYED_RELEASE_TABLET | Freq: Two times a day (BID) | ORAL | 3 refills | Status: DC

## 2022-09-05 NOTE — Patient Instructions (Signed)
It was a pleasure to see you today at our office.   Recommendations:  Follow up in 6  months Continue Memantine  but increase to 10 mg twice daily.Side effects were discussed  Increase Depakote 125 mg twice a day for hallucinations   Continue the [employer] for social activities  Continue Zoloft for anxiety      Whom to call:  Memory  decline, memory medications: Call our office [phone]   For psychiatric meds, mood meds: Please have your primary care physician manage these medications.     For assessment of decision of mental capacity and competency:  Call Dr. Erick Blinks, geriatric psychiatrist at [phone]  For guidance in geriatric dementia issues please call [facility] [phone]    If you have any severe symptoms of a stroke, or other severe issues such as confusion,severe chills or fever, etc call 911 or go to the ER as you may need to be evaluated further        RECOMMENDATIONS FOR ALL PATIENTS WITH MEMORY PROBLEMS: 1. Continue to exercise (Recommend 30 minutes of walking everyday, or 3 hours every week) 2. Increase social interactions - continue going to [place] and enjoy social gatherings with friends and family 3. Eat healthy, avoid fried foods and eat more fruits and vegetables 4. Maintain adequate blood pressure, blood sugar, and blood cholesterol level. Reducing the risk of stroke and cardiovascular disease also helps promoting better memory. 5. Avoid stressful situations. Live a simple life and avoid aggravations. Organize your time and prepare for the next day in anticipation. 6. Sleep well, avoid any interruptions of sleep and avoid any distractions in the bedroom that may interfere with adequate sleep quality 7. Avoid sugar, avoid sweets as there is a strong link between excessive sugar intake, diabetes, and cognitive impairment We discussed the Mediterranean diet, which has been shown to help patients reduce the risk of progressive  memory disorders and reduces cardiovascular risk. This includes eating fish, eat fruits and green leafy vegetables, nuts like almonds and hazelnuts, walnuts, and also use olive oil. Avoid fast foods and fried foods as much as possible. Avoid sweets and sugar as sugar use has been linked to worsening of memory function.  There is always a concern of gradual progression of memory problems. If this is the case, then we may need to adjust level of care according to patient needs. Support, both to the patient and caregiver, should then be put into place.    The Alzheimer's Association is here all day, every day for people facing Alzheimer's disease through our free 24/7 Helpline: [phone]. The Helpline provides reliable information and support to all those who need assistance, such as individuals living with memory loss, Alzheimer's or other dementia, caregivers, health care professionals and the public.  Our highly trained and knowledgeable staff can help you with: Understanding memory loss, dementia and Alzheimer's  Medications and other treatment options  General information about aging and brain health  Skills to provide quality care and to find the best care from professionals  Legal, financial and living-arrangement decisions Our Helpline also features: Confidential care consultation provided by master's level clinicians who can help with decision-making support, crisis assistance and education on issues families face every day  Help in a caller's preferred language using our translation service that features more than 200 languages and dialects  Referrals to local community programs, services and ongoing support     FALL PRECAUTIONS: Be cautious when walking. Scan the area for  obstacles that may increase the risk of trips and falls. When getting up in the mornings, sit up at the edge of the bed for a few minutes before getting out of bed. Consider elevating the bed at the head end to avoid  drop of blood pressure when getting up. Walk always in a well-lit room (use night lights in the walls). Avoid area rugs or power cords from appliances in the middle of the walkways. Use a walker or a cane if necessary and consider physical therapy for balance exercise. Get your eyesight checked regularly.  FINANCIAL OVERSIGHT: Supervision, especially oversight when making financial decisions or transactions is also recommended.  HOME SAFETY: Consider the safety of the kitchen when operating appliances like stoves, microwave oven, and blender. Consider having supervision and share cooking responsibilities until no longer able to participate in those. Accidents with firearms and other hazards in the house should be identified and addressed as well.   ABILITY TO BE LEFT ALONE: If patient is unable to contact 911 operator, consider using LifeLine, or when the need is there, arrange for someone to stay with patients. Smoking is a fire hazard, consider supervision or cessation. Risk of wandering should be assessed by caregiver and if detected at any point, supervision and safe proof recommendations should be instituted.  MEDICATION SUPERVISION: Inability to self-administer medication needs to be constantly addressed. Implement a mechanism to ensure safe administration of the medications.   DRIVING: Regarding driving, in patients with progressive memory problems, driving will be impaired. We advise to have someone else do the driving if trouble finding directions or if minor accidents are reported. Independent driving assessment is available to determine safety of driving.   If you are interested in the driving assessment, you can contact the following:  [employer] in [place] [phone]  [facility] [phone]  [facility] [phone] or [phone]      Mediterranean Diet A Mediterranean diet refers to food and  lifestyle choices that are based on the traditions of countries located on the [employer]. This way of eating has been shown to help prevent certain conditions and improve outcomes for people who have chronic diseases, like kidney disease and heart disease. What are tips for following this plan? Lifestyle  Cook and eat meals together with your family, when possible. Drink enough fluid to keep your urine clear or pale yellow. Be physically active every day. This includes: Aerobic exercise like running or swimming. Leisure activities like gardening, walking, or housework. Get 7-8 hours of sleep each night. If recommended by your health care provider, drink red wine in moderation. This means 1 glass a day for nonpregnant women and 2 glasses a day for men. A glass of wine equals 5 oz (150 mL). Reading food labels  Check the serving size of packaged foods. For foods such as rice and pasta, the serving size refers to the amount of cooked product, not dry. Check the total fat in packaged foods. Avoid foods that have saturated fat or trans fats. Check the ingredients list for added sugars, such as corn syrup. Shopping  At the grocery store, buy most of your food from the areas near the walls of the store. This includes: Fresh fruits and vegetables (produce). Grains, beans, nuts, and seeds. Some of these may be available in unpackaged forms or large amounts (in bulk). Fresh seafood. Poultry and eggs. Low-fat dairy products. Buy whole ingredients instead of prepackaged foods. Buy fresh fruits  and vegetables in-season from local farmers markets. Buy frozen fruits and vegetables in resealable bags. If you do not have access to quality fresh seafood, buy precooked frozen shrimp or canned fish, such as tuna, salmon, or sardines. Buy small amounts of raw or cooked vegetables, salads, or olives from the deli or salad bar at your store. Stock your pantry so you always have certain foods on hand, such  as olive oil, canned tuna, canned tomatoes, rice, pasta, and beans. Cooking  Cook foods with extra-virgin olive oil instead of using butter or other vegetable oils. Have meat as a side dish, and have vegetables or grains as your main dish. This means having meat in small portions or adding small amounts of meat to foods like pasta or stew. Use beans or vegetables instead of meat in common dishes like chili or lasagna. Experiment with different cooking methods. Try roasting or broiling vegetables instead of steaming or sauteing them. Add frozen vegetables to soups, stews, pasta, or rice. Add nuts or seeds for added healthy fat at each meal. You can add these to yogurt, salads, or vegetable dishes. Marinate fish or vegetables using olive oil, lemon juice, garlic, and fresh herbs. Meal planning  Plan to eat 1 vegetarian meal one day each week. Try to work up to 2 vegetarian meals, if possible. Eat seafood 2 or more times a week. Have healthy snacks readily available, such as: Vegetable sticks with hummus. Greek yogurt. Fruit and nut trail mix. Eat balanced meals throughout the week. This includes: Fruit: 2-3 servings a day Vegetables: 4-5 servings a day Low-fat dairy: 2 servings a day Fish, poultry, or lean meat: 1 serving a day Beans and legumes: 2 or more servings a week Nuts and seeds: 1-2 servings a day Whole grains: 6-8 servings a day Extra-virgin olive oil: 3-4 servings a day Limit red meat and sweets to only a few servings a month What are my food choices? Mediterranean diet Recommended Grains: Whole-grain pasta. Brown rice. Bulgar wheat. Polenta. Couscous. Whole-wheat bread. Orpah Cobb. Vegetables: Artichokes. Beets. Broccoli. Cabbage. Carrots. Eggplant. Green beans. Chard. Kale. Spinach. Onions. Leeks. Peas. Squash. Tomatoes. Peppers. Radishes. Fruits: Apples. Apricots. Avocado. Berries. Bananas. Cherries. Dates. Figs. Grapes. Lemons. Melon. Oranges. Peaches. Plums.  Pomegranate. Meats and other protein foods: Beans. Almonds. Sunflower seeds. Pine nuts. Peanuts. Cod. Salmon. Scallops. Shrimp. Tuna. Tilapia. Clams. Oysters. Eggs. Dairy: Low-fat milk. Cheese. Greek yogurt. Beverages: Water. Red wine. Herbal tea. Fats and oils: Extra virgin olive oil. Avocado oil. Grape seed oil. Sweets and desserts: [place] yogurt with honey. Baked apples. Poached pears. Trail mix. Seasoning and other foods: Basil. Cilantro. Coriander. Cumin. Mint. Parsley. Sage. Rosemary. Tarragon. Garlic. Oregano. Thyme. Pepper. Balsalmic vinegar. Tahini. Hummus. Tomato sauce. Olives. Mushrooms. Limit these Grains: Prepackaged pasta or rice dishes. Prepackaged cereal with added sugar. Vegetables: Deep fried potatoes (french fries). Fruits: Fruit canned in syrup. Meats and other protein foods: Beef. Pork. Lamb. Poultry with skin. Hot dogs. Tomasa Blase. Dairy: Ice cream. Sour cream. Whole milk. Beverages: Juice. Sugar-sweetened soft drinks. Beer. Liquor and spirits. Fats and oils: Butter. Canola oil. Vegetable oil. Beef fat (tallow). Lard. Sweets and desserts: Cookies. Cakes. Pies. Candy. Seasoning and other foods: Mayonnaise. Premade sauces and marinades. The items listed may not be a complete list. Talk with your dietitian about what dietary choices are right for you. Summary The Mediterranean diet includes both food and lifestyle choices. Eat a variety of fresh fruits and vegetables, beans, nuts, seeds, and whole grains. Limit the amount of red  meat and sweets that you eat. Talk with your health care provider about whether it is safe for you to drink red wine in moderation. This means 1 glass a day for nonpregnant women and 2 glasses a day for men. A glass of wine equals 5 oz (150 mL). This information is not intended to replace advice given to you by your health care provider. Make sure you discuss any questions you have with your health care provider. Document Released: 08/16/2015 Document  Revised: 09/18/2015 Document Reviewed: 08/16/2015 Elsevier Interactive Patient Education  2017 [employer].

## 2022-09-05 NOTE — Telephone Encounter (Signed)
Pt ready for scheduling for PROLIA on or after : 09/05/22  Out-of-pocket cost due at time of visit: $345  Primary: [employer] Prolia co-insurance: 20% Admin fee co-insurance: 20%  Secondary: --- Prolia co-insurance:  Admin fee co-insurance:   Medical Benefit Details: Date Benefits were checked: 08/25/22 Deductible: NO/ Coinsurance: 20%/ Admin Fee: 20%  Prior Auth: APPROVED PA# [id number] Expiration Date: 08/27/22-08/27/23  # of doses approved: 2  Pharmacy benefit: Copay $100 If patient wants fill through the pharmacy benefit please send prescription to: [employer], and include estimated need by date in rx notes. Pharmacy will ship medication directly to the office.  Patient NOT eligible for Prolia Copay Card. Copay Card can make patient's cost as little as $25. Link to apply: [email or web address]  ** This summary of benefits is an estimation of the patient's out-of-pocket cost. Exact cost may very based on individual plan coverage.

## 2022-09-11 ENCOUNTER — Other Ambulatory Visit: Payer: Self-pay

## 2022-09-11 MED ORDER — DENOSUMAB 60 MG/ML ~~LOC~~ SOSY: 60.0000 mg | PREFILLED_SYRINGE | Freq: Once | SUBCUTANEOUS | 0 refills | Status: AC

## 2022-09-11 NOTE — Telephone Encounter (Signed)
Called patient reviewed all following information including appointment, Co pay due at time of visit and if pick up of injection is needed from outside pharmacy.     Out of pocket for patient: $100 from pharmacy   Lab appointment : 08/11/22 ok to use this lab per Dr. Reece Agar   Nurse visit:  09/24/22  Lab order placed: No  Prolia has been  []   Ordered  []   Script sent to local pharmacy for patient to bring   [x]   Script sent to [facility] pharmacy   []   Script sent to [facility] to deliver

## 2022-09-24 ENCOUNTER — Ambulatory Visit: Payer: Medicare HMO

## 2022-09-24 ENCOUNTER — Telehealth: Payer: Self-pay

## 2022-09-24 DIAGNOSIS — M810 Age-related osteoporosis without current pathological fracture: Secondary | ICD-10-CM

## 2022-09-24 NOTE — Telephone Encounter (Signed)
Pt came in for prolia injection as NV. Pts son, [name] [facility] signed) said that he was concerned about increased copay if did not get med from [facility].  [name] [name] will contact pharmacy # on back of pts ins card and [name] request cb when Joellen speaks with [facility]. Sending note to [facility] CMA. (Pt did not get Prolia injection today.)

## 2022-09-25 NOTE — Telephone Encounter (Signed)
Have called insurance. Medication [facility]. They have not sent out due to not reaching patient to confirm copay. Patient will need to call insurance at number below in order to pay copay and have sent to office. Due to time from last labs she will need to have done before we can do injections. Left message at patient number to give information as well as number below. They will need to reach out to our office after verified that will be shipped to reschedule appointments.   [phone]

## 2022-10-15 MED ORDER — DENOSUMAB 60 MG/ML ~~LOC~~ SOSY: 60.0000 mg | PREFILLED_SYRINGE | SUBCUTANEOUS | 1 refills | Status: DC

## 2022-10-15 NOTE — Telephone Encounter (Addendum)
[facility]  [facility] clarification form in Lisa's box.

## 2022-10-15 NOTE — Addendum Note (Signed)
Addended by: Eustaquio Boyden on: 10/15/2022 07:31 AM   Modules accepted: Orders

## 2022-11-10 ENCOUNTER — Other Ambulatory Visit: Payer: Self-pay | Admitting: Physician Assistant

## 2022-12-22 NOTE — Telephone Encounter (Signed)
Patient son [name] called in and stated that he was trying to get her denosumab (PROLIA) 60 MG/ML SOSY injection refilled so they can get her rescheduled for her injection. Please advise. Thank you!

## 2022-12-23 NOTE — Telephone Encounter (Signed)
Patient never received last prolia. Do you want me to call and try to restart?

## 2022-12-24 NOTE — Telephone Encounter (Addendum)
Yes please. Son should be called to schedule all appts including Prolia nurse visit given pt with dementia.

## 2023-01-09 MED ORDER — DENOSUMAB 60 MG/ML ~~LOC~~ SOSY: 60.0000 mg | PREFILLED_SYRINGE | Freq: Once | SUBCUTANEOUS | 0 refills | Status: AC

## 2023-01-09 NOTE — Telephone Encounter (Signed)
 Called spoke to son. Has set up nurse visit and lab. Script sent to pharmacy for them to send. Son aware of patient copay. Will reach out if any questions.

## 2023-01-09 NOTE — Addendum Note (Signed)
 Addended by: Donnamarie Poag on: 01/09/2023 09:59 AM   Modules accepted: Orders

## 2023-01-13 IMAGING — DX DG CHEST 2V
2 series · 2 of 2 positions shown · non-contrast
Comparison: 10/10/2014

CLINICAL DATA: Unintentional weight loss

EXAM:
CHEST - 2 VIEW

[chest pa]
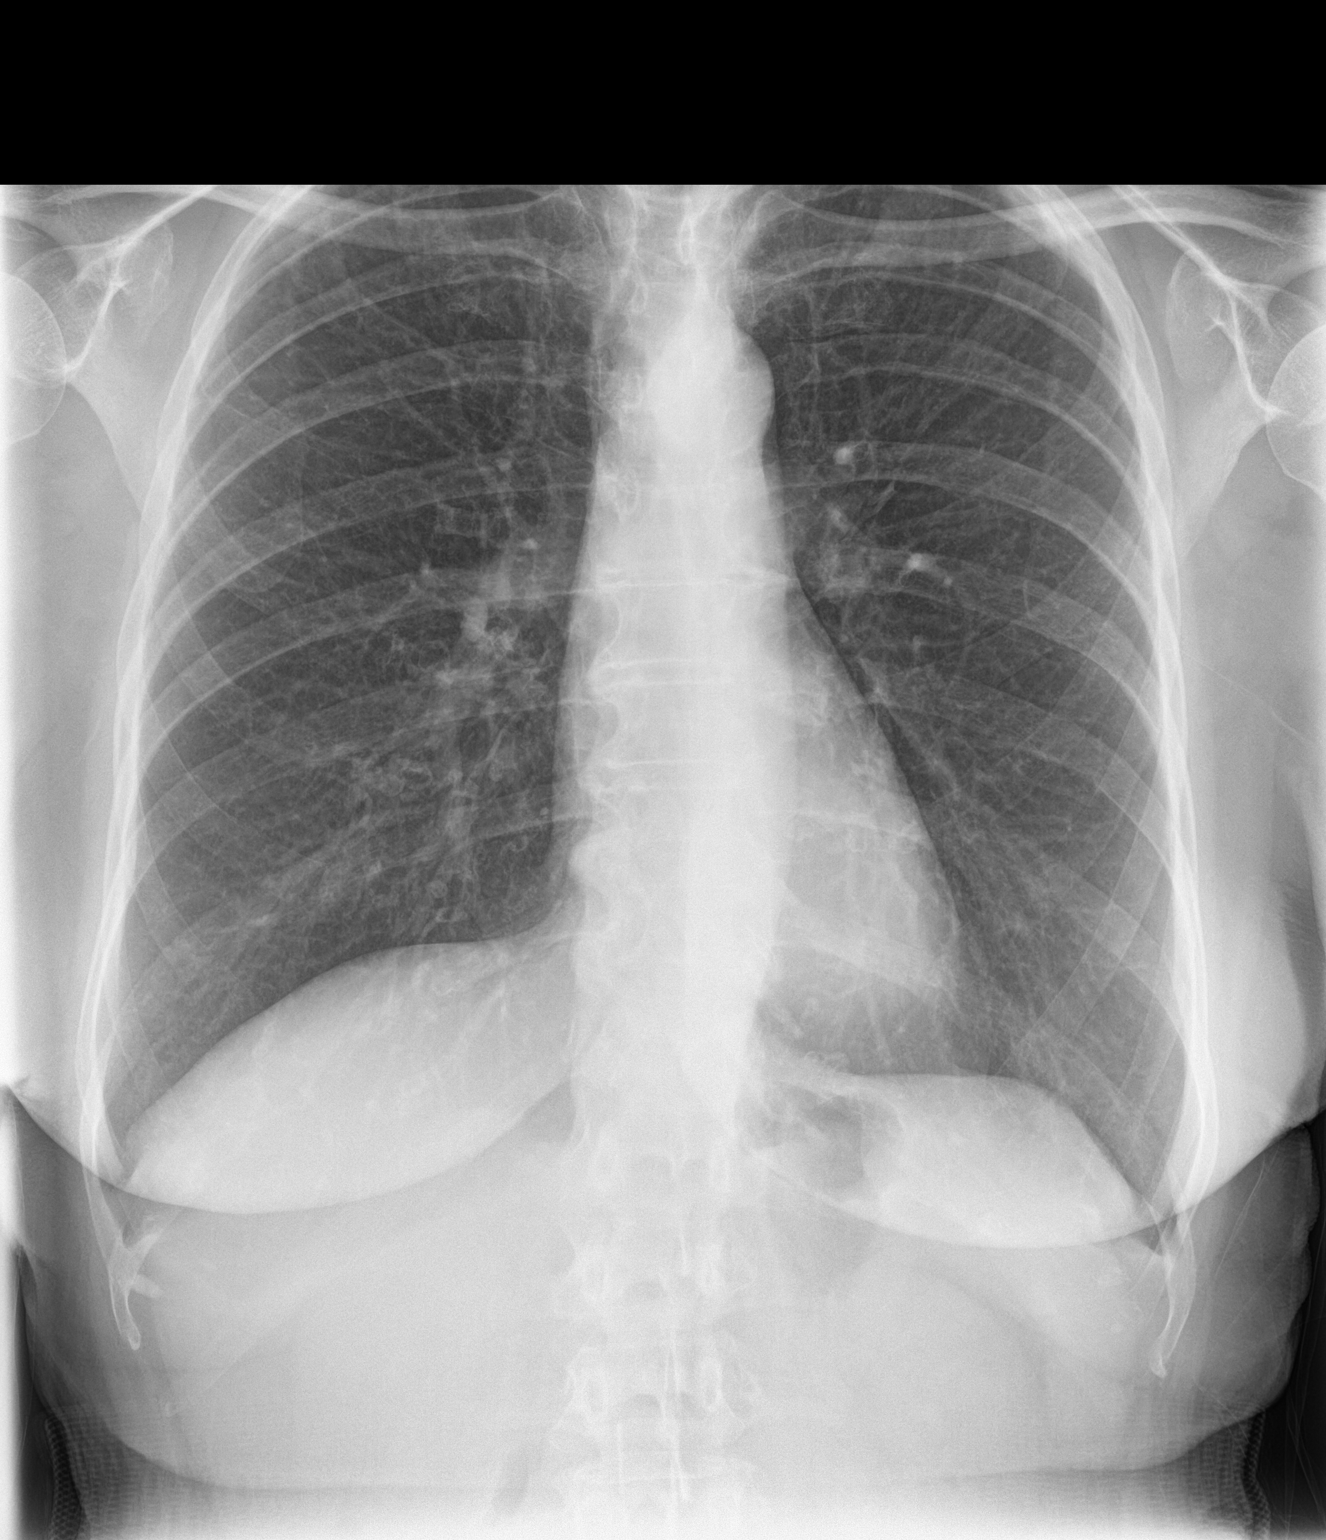

[chest lat]
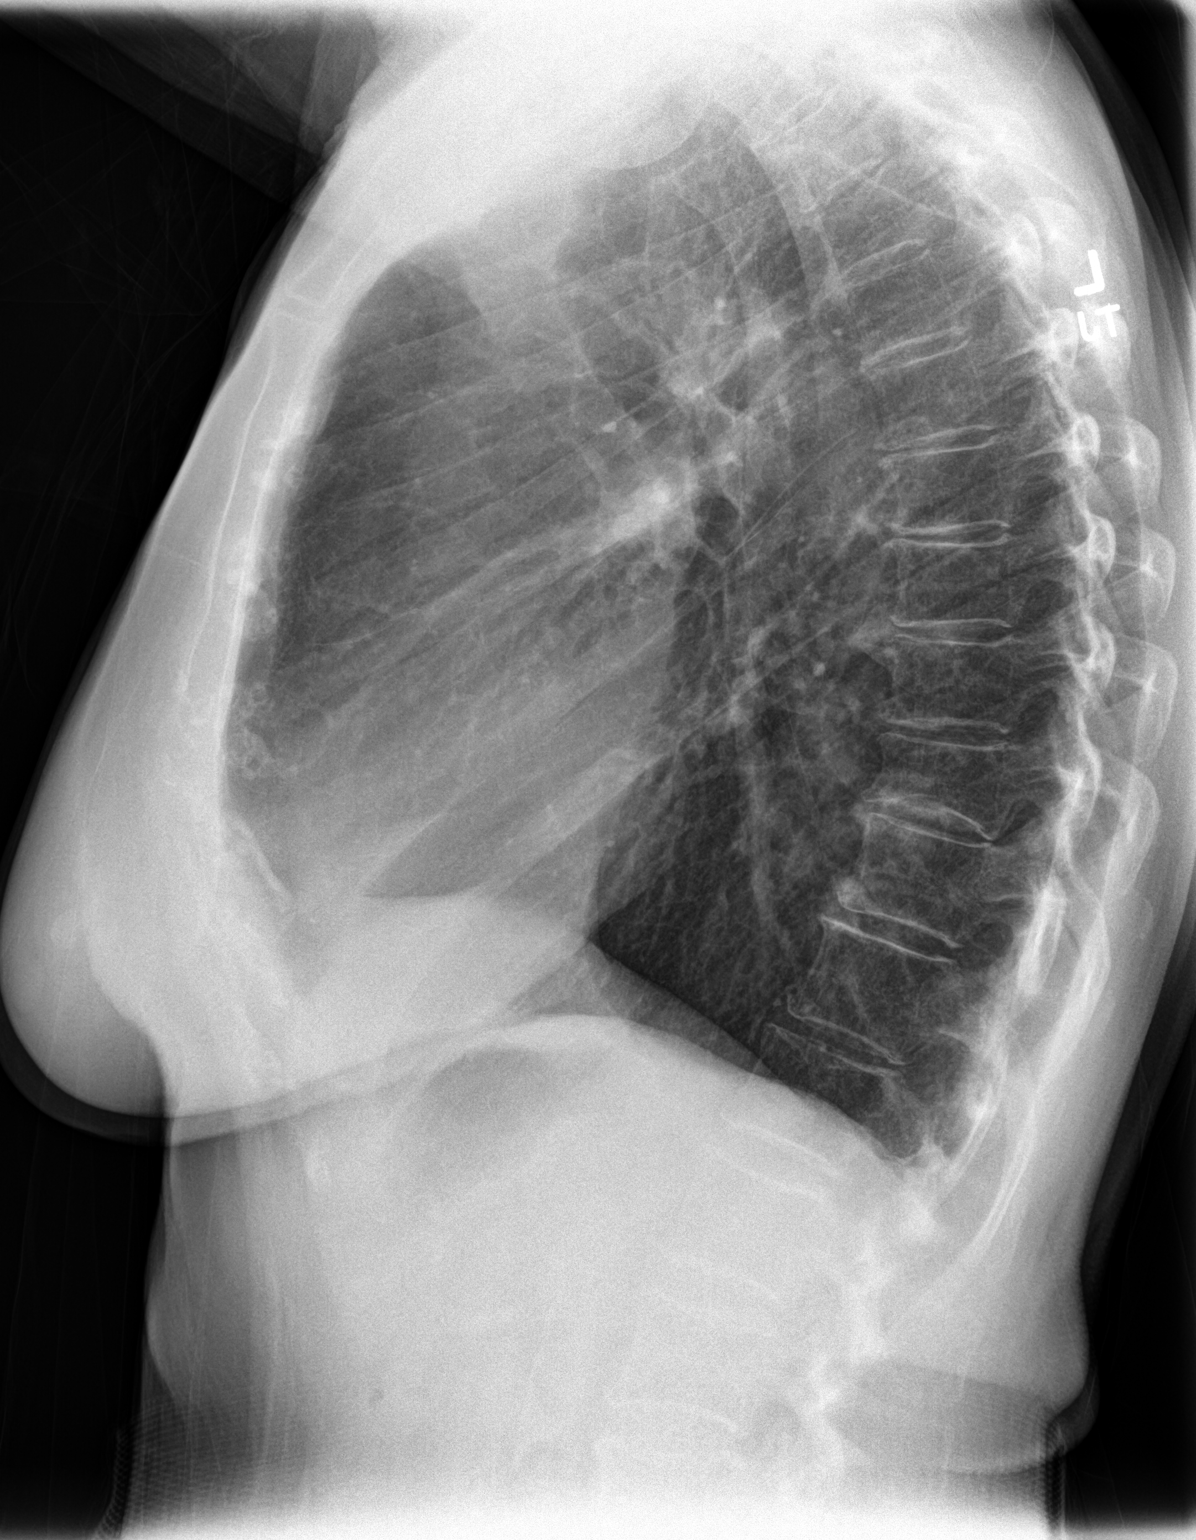

[2 of 2 positions shown; findings below may reference images not displayed]

FINDINGS: Normal heart size, mediastinal contours, and pulmonary vascularity.

Lungs clear.

No pulmonary infiltrate, pleural effusion, or pneumothorax.

Mild scattered endplate spur formation thoracic spine and osseous
demineralization.

Interval inferior endplate compression deformity of a lower thoracic
vertebra, approximately T10, with mild anterior height loss.
IMPRESSION: No acute abnormalities.

## 2023-01-14 ENCOUNTER — Telehealth: Payer: Self-pay | Admitting: *Deleted

## 2023-01-14 NOTE — Telephone Encounter (Signed)
 Received CRM stating pt's son, [name], requested a call back from Joellen to discuss pt's prolia inj? Call back # [phone]

## 2023-01-14 NOTE — Telephone Encounter (Signed)
 Copied from CRM [id number]. Topic: Clinical - Medication Question >> Jan 13, 2023  2:39 PM Viola F wrote: Reason for CRM: [facility] regarding pts Prolia  injection - says she has 1 dose for 60mg  but she needs the frequency asap. Please call back at [phone] or you can fax information to [phone].

## 2023-01-16 ENCOUNTER — Other Ambulatory Visit (INDEPENDENT_AMBULATORY_CARE_PROVIDER_SITE_OTHER): Payer: Medicare HMO

## 2023-01-16 ENCOUNTER — Telehealth: Payer: Self-pay | Admitting: Family Medicine

## 2023-01-16 DIAGNOSIS — M810 Age-related osteoporosis without current pathological fracture: Secondary | ICD-10-CM | POA: Diagnosis not present

## 2023-01-16 LAB — BASIC METABOLIC PANEL
BUN: 7 mg/dL (ref 6–23)
CO2: 31 meq/L (ref 19–32)
Calcium: 9.1 mg/dL (ref 8.4–10.5)
Chloride: 104 meq/L (ref 96–112)
Creatinine, Ser: 0.69 mg/dL (ref 0.40–1.20)
GFR: 83.2 mL/min (ref >60.00)
Glucose, Bld: 99 mg/dL (ref 70–99)
Potassium: 4.1 meq/L (ref 3.5–5.1)
Sodium: 143 meq/L (ref 135–145)

## 2023-01-16 NOTE — Telephone Encounter (Signed)
 Patient would like to get [facility] shot [facility] due to cost reason.  Please call and discuss with patient they have questions regarding cost.

## 2023-01-20 NOTE — Telephone Encounter (Addendum)
 I spoke with Marilyn Cook (DPR signed) Marilyn Cook understands will cancel appt for prolia  injection on 01/21/23 and that Joellen will be in touch with Marilyn Cook. Marilyn Cook said that would be OK but Marilyn Cook is wondering due to cost of Prolia  and pt transportation to office is there an affordable good option for an oral med for pt to take for osteoporosis?, Marilyn Cook does not think co pay has been paid for prolia  yet.Marilyn Cook request cb. Sending note to Joellen CMA and Dr [email or web address].SABRA

## 2023-01-21 ENCOUNTER — Ambulatory Visit: Payer: Medicare HMO

## 2023-01-21 NOTE — Telephone Encounter (Signed)
 From my last note, we didn't think pt would be good candidate for weekly fosamax oral pill due to complicated instructions on correct administration:  Administer first thing in the morning and >=30 minutes before the first food, beverage (except plain water), or other medication(s) of the day. Do not take with mineral water or with other beverages. Patients should be instructed to stay upright (not to lie down) for >=30 minutes and until after first food of the day (to reduce esophageal irritation).   Does he think she could do this weekly?

## 2023-01-22 NOTE — Telephone Encounter (Signed)
Duplicate message see other message for further documentation

## 2023-01-22 NOTE — Telephone Encounter (Signed)
Spoke with pt's son, "[name]" (on dpr), relaying Dr Timoteo Expose message. [name] admits to forgetting that conversation and states it would be better for pt to get Prolia inj twice a yr. States he wants to proceed with having shot provided by our office and getting pt scheduled soon since she just had labs done.

## 2023-01-23 NOTE — Telephone Encounter (Signed)
When patient son called insurance to pay for mediation they gave him a quote of $600 for prolia. Our records show $100 but have not been ran this year can someone follow up on this for me.

## 2023-01-29 ENCOUNTER — Other Ambulatory Visit (HOSPITAL_COMMUNITY): Payer: Self-pay

## 2023-01-29 ENCOUNTER — Telehealth: Payer: Self-pay

## 2023-01-29 NOTE — Telephone Encounter (Signed)
Pharmacy Patient Advocate Encounter  Insurance verification completed.   The patient is insured through [employer] claim for Prolia. Currently a quantity of 1ml is a 180 day supply and the co-pay is $645.79 .   This test claim was processed through [facility]- copay amounts may vary at other pharmacies due to pharmacy/plan contracts, or as the patient moves through the different stages of their insurance plan.

## 2023-01-29 NOTE — Telephone Encounter (Signed)
Prolia VOB initiated via [email or web address] 

## 2023-01-30 ENCOUNTER — Other Ambulatory Visit (HOSPITAL_COMMUNITY): Payer: Self-pay

## 2023-02-05 ENCOUNTER — Other Ambulatory Visit (HOSPITAL_COMMUNITY): Payer: Self-pay

## 2023-02-05 NOTE — Telephone Encounter (Signed)
Pt ready for scheduling for Prolia on or after : 02/05/23  Out-of-pocket cost due at time of visit: $323  Number of injection/visits approved: --  Primary: [employer] - Medicare Prolia co-insurance: 20% Admin fee co-insurance: 20%  Secondary: N/A Prolia co-insurance:  Admin fee co-insurance:   Medical Benefit Details: Date Benefits were checked: 02/05/23 Deductible: no/ Coinsurance: 20%/ Admin Fee: 20%  Prior Auth: Approved PA# [id number] Expiration Date: 08/27/22 to 08/27/23  # of doses approved:  Pharmacy benefit: Copay $645.79 If patient wants fill through the pharmacy benefit please send prescription to:  [facility] , and include estimated need by date in rx notes. Pharmacy will ship medication directly to the office.  Patient not eligible for Prolia Copay Card. Copay Card can make patient's cost as little as $25. Link to apply: [email or web address]://[email or web address]  ** This summary of benefits is an estimation of the patient's out-of-pocket cost. Exact cost may very based on individual plan coverage.

## 2023-02-05 NOTE — Telephone Encounter (Signed)
Do we have a cost for patient?

## 2023-02-05 NOTE — Telephone Encounter (Signed)
[name]

## 2023-02-06 NOTE — Telephone Encounter (Signed)
Called patient reviewed all following information including appointment, Co pay due at time of visit and if pick up of injection is needed from outside pharmacy.     Out of pocket for patient: $323   Lab appointment : had 1/10  Nurse visit:  02/10/22  Lab order placed: No  Prolia has been  [x]   Ordered  []   Script sent to local pharmacy for patient to bring   []   Script sent to Specialty pharmacy   []   Script sent to [facility] to deliver

## 2023-02-11 ENCOUNTER — Ambulatory Visit: Payer: Medicare HMO

## 2023-02-11 DIAGNOSIS — M810 Age-related osteoporosis without current pathological fracture: Secondary | ICD-10-CM | POA: Diagnosis not present

## 2023-02-11 MED ORDER — DENOSUMAB 60 MG/ML ~~LOC~~ SOSY
60.0000 mg | PREFILLED_SYRINGE | Freq: Once | SUBCUTANEOUS | Status: AC
  Administered 2023-02-11: 60 mg via SUBCUTANEOUS

## 2023-02-11 MED ORDER — DENOSUMAB 60 MG/ML ~~LOC~~ SOSY: 60.0000 mg | PREFILLED_SYRINGE | Freq: Once | SUBCUTANEOUS | Status: DC

## 2023-02-11 NOTE — Addendum Note (Signed)
 Addended by: Katheleen Palmer on: 02/11/2023 11:27 AM   Modules accepted: Orders

## 2023-02-11 NOTE — Progress Notes (Signed)
 Per orders of Dr. Claire Crick, injection of prolia  60 mg [place] given by Claretha Crocker in right arm. Patient tolerated injection well. Patient will make appointment for 6 month.

## 2023-02-23 ENCOUNTER — Encounter: Payer: Self-pay | Admitting: Family Medicine

## 2023-02-23 ENCOUNTER — Ambulatory Visit (INDEPENDENT_AMBULATORY_CARE_PROVIDER_SITE_OTHER): Payer: Medicare HMO | Admitting: Family Medicine

## 2023-02-23 VITALS — BP 124/78 | HR 79 | Temp 98.3°F | Ht 61.75 in | Wt 123.1 lb

## 2023-02-23 DIAGNOSIS — J310 Chronic rhinitis: Secondary | ICD-10-CM | POA: Diagnosis not present

## 2023-02-23 DIAGNOSIS — F0280 Dementia in other diseases classified elsewhere without behavioral disturbance: Secondary | ICD-10-CM | POA: Diagnosis not present

## 2023-02-23 DIAGNOSIS — G309 Alzheimer's disease, unspecified: Secondary | ICD-10-CM

## 2023-02-23 DIAGNOSIS — M810 Age-related osteoporosis without current pathological fracture: Secondary | ICD-10-CM | POA: Diagnosis not present

## 2023-02-23 DIAGNOSIS — F411 Generalized anxiety disorder: Secondary | ICD-10-CM

## 2023-02-23 MED ORDER — MAGNESIUM 250 MG PO CAPS: 1.0000 | ORAL_CAPSULE | Freq: Every evening | ORAL | Status: AC

## 2023-02-23 NOTE — Assessment & Plan Note (Signed)
Latest prolia injection 02/11/2023. Next due 08/11/2023.

## 2023-02-23 NOTE — Assessment & Plan Note (Signed)
Ongoing - continue claritin vs zyrtec use.  No signs of bacterial or viral infection at this time.

## 2023-02-23 NOTE — Assessment & Plan Note (Addendum)
Followed by neurology on Namenda 10mg  bid and depakote. Appreciate neuro care.  Last neuro visit depakote was increased to 125mg  bid - however son states they've only been taking this once daily. They will verify dosing at next neuro visit.  Son states they have never used [employer] - it was likely unaffordable.  Donepezil intolerance - GI upset.  Continues sertraline 25mg  daily. Unclear if she's truly on klonopin - we haven't prescribed recently but remains on her med list - they will verify at home.

## 2023-02-23 NOTE — Patient Instructions (Addendum)
Check with neurology on depakote dosing (I believe it was supposed to be twice daily).  Continue other medicines.  Check to see if you're taking Klonopin (clonazepam) and if so let me know who prescribes.  Return in 6 months for physical

## 2023-02-23 NOTE — Assessment & Plan Note (Signed)
Stable period on low dose sertraline  

## 2023-02-23 NOTE — Progress Notes (Signed)
Ph: [phone] Fax: [phone]   Patient ID: Marilyn Cook, female    DOB: [birth year], 78 y.o.   MRN: [id number]  This visit was conducted in person.  BP 124/78   Pulse 79   Temp 98.3 F (36.8 C) (Oral)   Ht 5' 1.75" (1.568 m)   Wt 123 lb 2 oz (55.8 kg)   SpO2 96%   BMI 22.70 kg/m    CC: 6 mo f/u visit  Subjective:   HPI: Marilyn Cook is a 78 y.o. female presenting on 02/23/2023 for Medical Management of Chronic Issues (Here for 6 mo f/u. Also, c/o nasal congestion and hoarseness. Sxs stared 02/18/23. Denies any other sxs. Pt accompanied by son, [name]. )   Planning to start having aide come out to house a few times a week.   OP - received latest Prolia shot 02/11/2023.   Seeing [facility] neurology Dr Brent Bulla PA for alzheimer's dementia on depakote 125mg  BID for visual hallucinations and sundowning as well as namenda 10mg  bid. Did not tolerate Aricept. She also continues low dose sertraline 25mg  daily. Never tried [place] patches due to cost.   Nasal congestion and hoarseness present for the past 5 days, notes left eye waters regularly. Treating with menthol cough lozenges with benefit.  Hoarseness is better.  No fevers/chills, cough, appetite ok.  10 lb weight loss.      Relevant past medical, surgical, family and social history reviewed and updated as indicated. Interim medical history since our last visit reviewed. Allergies and medications reviewed and updated. Outpatient Medications Prior to Visit  Medication Sig Dispense Refill   atorvastatin (LIPITOR) 40 MG tablet Take 1 tablet (40 mg total) by mouth daily. 90 tablet 4   Calcium Carb-Cholecalciferol (CALCIUM-VITAMIN D) 600-400 MG-UNIT TABS Take 1 tablet by mouth 2 (two) times daily.     cetirizine (ZYRTEC) 10 MG tablet Take 10 mg by mouth daily.     clopidogrel (PLAVIX) 75 MG tablet Take 1 tablet (75 mg total) by mouth daily. 90 tablet 4   co-enzyme Q-10 30 MG capsule Take 30 mg by mouth daily.      cyanocobalamin (VITAMIN B12) 500 MCG tablet Take 1 tablet (500 mcg total) by mouth daily.     denosumab (PROLIA) 60 MG/ML SOSY injection Inject 60 mg into the skin every 6 (six) months. 1 mL 1   divalproex (DEPAKOTE) 125 MG DR tablet TAKE 1 TABLET (125 MG TOTAL) BY MOUTH AT BEDTIME 90 tablet 1   hydrocortisone 2.5 % cream Apply 1 application  topically 2 (two) times daily as needed (itching).     levothyroxine (SYNTHROID) 25 MCG tablet Take 1 tablet (25 mcg total) by mouth daily before breakfast. 90 tablet 4   memantine (NAMENDA) 10 MG tablet TAKE 1 TABLET BY MOUTH TWICE A DAY 180 tablet 1   Potassium 99 MG TABS Take 1 tablet (99 mg total) by mouth daily.     Pyridoxine HCl (VITAMIN B-6 PO) Take 1 tablet by mouth daily.     sertraline (ZOLOFT) 25 MG tablet Take 1 tablet (25 mg total) by mouth daily. 90 tablet 4   triamcinolone (KENALOG) 0.147 MG/GM topical spray Apply topically 2 (two) times daily as needed (psoriasis rash on scalp). 63 g 3   clonazePAM (KLONOPIN) 0.5 MG tablet Take 0.5 mg by mouth once.     Facility-Administered Medications Prior to Visit  Medication Dose Route Frequency Provider Last Rate Last Admin   [START ON 08/11/2023] denosumab (PROLIA)  injection 60 mg  60 mg Subcutaneous Once Eustaquio Boyden, MD         Per HPI unless specifically indicated in ROS section below Review of Systems  Objective:  BP 124/78   Pulse 79   Temp 98.3 F (36.8 C) (Oral)   Ht 5' 1.75" (1.568 m)   Wt 123 lb 2 oz (55.8 kg)   SpO2 96%   BMI 22.70 kg/m   Wt Readings from Last 3 Encounters:  02/23/23 123 lb 2 oz (55.8 kg)  09/05/22 133 lb (60.3 kg)  08/18/22 133 lb 4 oz (60.4 kg)      Physical Exam Vitals and nursing note reviewed.  Constitutional:      Appearance: Normal appearance. She is not ill-appearing.  HENT:     Head: Normocephalic and atraumatic.     Right Ear: Tympanic membrane, ear canal and external ear normal.     Left Ear: Ear canal and external ear normal. There is  impacted cerumen.     Nose: Nose normal. No congestion or rhinorrhea.     Mouth/Throat:     Mouth: Mucous membranes are moist.     Pharynx: Oropharynx is clear. No oropharyngeal exudate or posterior oropharyngeal erythema.  Eyes:     Extraocular Movements: Extraocular movements intact.     Conjunctiva/sclera: Conjunctivae normal.     Pupils: Pupils are equal, round, and reactive to light.  Cardiovascular:     Rate and Rhythm: Normal rate and regular rhythm.     Pulses: Normal pulses.     Heart sounds: Normal heart sounds. No murmur heard. Pulmonary:     Effort: Pulmonary effort is normal. No respiratory distress.     Breath sounds: Normal breath sounds. No wheezing, rhonchi or rales.  Musculoskeletal:     Cervical back: Normal range of motion and neck supple.     Right lower leg: No edema.     Left lower leg: No edema.  Lymphadenopathy:     Cervical: No cervical adenopathy.  Skin:    General: Skin is warm and dry.     Findings: No rash.  Neurological:     Mental Status: She is alert.  Psychiatric:        Mood and Affect: Mood normal.        Behavior: Behavior normal.       Results for orders placed or performed in visit on 01/16/23  Basic Metabolic Panel   Collection Time: 01/16/23 10:17 AM  Result Value Ref Range   Sodium 143 135 - 145 mEq/L   Potassium 4.1 3.5 - 5.1 mEq/L   Chloride 104 96 - 112 mEq/L   CO2 31 19 - 32 mEq/L   Glucose, Bld 99 70 - 99 mg/dL   BUN 7 6 - 23 mg/dL   Creatinine, Ser 1.61 0.40 - 1.20 mg/dL   GFR 09.60 >45.40 mL/min   Calcium 9.1 8.4 - 10.5 mg/dL    Assessment & Plan:   Problem List Items Addressed This Visit     Mixed rhinitis   Ongoing - continue claritin vs zyrtec use.  No signs of bacterial or viral infection at this time.       Osteoporosis   Latest prolia injection 02/11/2023. Next due 08/11/2023.       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 Appreciate  neuro care.  Last neuro visit depakote was increased to 125mg  bid - however son states  they've only been taking this once daily. They will verify dosing at next neuro visit.  Son states they have never used [employer] - it was likely unaffordable.  Donepezil intolerance - GI upset.  Continues sertraline 25mg  daily. Unclear if she's truly on klonopin - we haven't prescribed recently but remains on her med list - they will verify at home.       Generalized anxiety disorder   Stable period on low dose sertraline.         Meds ordered this encounter  Medications   Magnesium 250 MG CAPS    Sig: Take 1 capsule by mouth at bedtime.    No orders of the defined types were placed in this encounter.   Patient Instructions  Check with neurology on depakote dosing (I believe it was supposed to be twice daily).  Continue other medicines.  Check to see if you're taking Klonopin (clonazepam) and if so let me know who prescribes.  Return in 6 months for physical  Follow up plan: Return in about 6 months (around 08/23/2023) for annual exam, prior fasting for blood work.  Eustaquio Boyden, MD

## 2023-03-09 ENCOUNTER — Encounter: Payer: Self-pay | Admitting: Physician Assistant

## 2023-03-09 ENCOUNTER — Ambulatory Visit: Payer: Medicare HMO | Admitting: Physician Assistant

## 2023-03-09 VITALS — BP 122/68 | HR 96 | Resp 20 | Ht 61.75 in | Wt 127.0 lb

## 2023-03-09 DIAGNOSIS — G309 Alzheimer's disease, unspecified: Secondary | ICD-10-CM

## 2023-03-09 DIAGNOSIS — F0280 Dementia in other diseases classified elsewhere without behavioral disturbance: Secondary | ICD-10-CM

## 2023-03-09 MED ORDER — RIVASTIGMINE TARTRATE 1.5 MG PO CAPS: ORAL_CAPSULE | ORAL | 11 refills | Status: DC

## 2023-03-09 NOTE — Patient Instructions (Addendum)
 It was a pleasure to see you today at our office.   Recommendations:  Follow up in 6  months Continue Memantine  but  10 mg twice daily.Side effects were discussed  Continue  Depakote 125 mg twice a day for hallucinations  Start rivastigmine  1.5 milligrams.  Take 1 Nightly for 2 weeks then increase to 1 cap twice daily if tolerated.  Recommend [facility] day program    Whom to call:  Memory  decline, memory medications: Call our office [phone]   For psychiatric meds, mood meds: Please have your primary care physician manage these medications.     For assessment of decision of mental capacity and competency:  Call Dr. Erick Blinks, geriatric psychiatrist at [phone]  For guidance in geriatric dementia issues please call [facility] [phone]    If you have any severe symptoms of a stroke, or other severe issues such as confusion,severe chills or fever, etc call 911 or go to the ER as you may need to be evaluated further        RECOMMENDATIONS FOR ALL PATIENTS WITH MEMORY PROBLEMS: 1. Continue to exercise (Recommend 30 minutes of walking everyday, or 3 hours every week) 2. Increase social interactions - continue going to [place] and enjoy social gatherings with friends and family 3. Eat healthy, avoid fried foods and eat more fruits and vegetables 4. Maintain adequate blood pressure, blood sugar, and blood cholesterol level. Reducing the risk of stroke and cardiovascular disease also helps promoting better memory. 5. Avoid stressful situations. Live a simple life and avoid aggravations. Organize your time and prepare for the next day in anticipation. 6. Sleep well, avoid any interruptions of sleep and avoid any distractions in the bedroom that may interfere with adequate sleep quality 7. Avoid sugar, avoid sweets as there is a strong link between excessive sugar intake, diabetes, and cognitive impairment We discussed the Mediterranean diet, which has  been shown to help patients reduce the risk of progressive memory disorders and reduces cardiovascular risk. This includes eating fish, eat fruits and green leafy vegetables, nuts like almonds and hazelnuts, walnuts, and also use olive oil. Avoid fast foods and fried foods as much as possible. Avoid sweets and sugar as sugar use has been linked to worsening of memory function.  There is always a concern of gradual progression of memory problems. If this is the case, then we may need to adjust level of care according to patient needs. Support, both to the patient and caregiver, should then be put into place.    The Alzheimer's Association is here all day, every day for people facing Alzheimer's disease through our free 24/7 Helpline: [phone]. The Helpline provides reliable information and support to all those who need assistance, such as individuals living with memory loss, Alzheimer's or other dementia, caregivers, health care professionals and the public.  Our highly trained and knowledgeable staff can help you with: Understanding memory loss, dementia and Alzheimer's  Medications and other treatment options  General information about aging and brain health  Skills to provide quality care and to find the best care from professionals  Legal, financial and living-arrangement decisions Our Helpline also features: Confidential care consultation provided by master's level clinicians who can help with decision-making support, crisis assistance and education on issues families face every day  Help in a caller's preferred language using our translation service that features more than 200 languages and dialects  Referrals to local community programs, services and ongoing support  FALL PRECAUTIONS: Be cautious when walking. Scan the area for obstacles that may increase the risk of trips and falls. When getting up in the mornings, sit up at the edge of the bed for a few minutes before getting out  of bed. Consider elevating the bed at the head end to avoid drop of blood pressure when getting up. Walk always in a well-lit room (use night lights in the walls). Avoid area rugs or power cords from appliances in the middle of the walkways. Use a walker or a cane if necessary and consider physical therapy for balance exercise. Get your eyesight checked regularly.  FINANCIAL OVERSIGHT: Supervision, especially oversight when making financial decisions or transactions is also recommended.  HOME SAFETY: Consider the safety of the kitchen when operating appliances like stoves, microwave oven, and blender. Consider having supervision and share cooking responsibilities until no longer able to participate in those. Accidents with firearms and other hazards in the house should be identified and addressed as well.   ABILITY TO BE LEFT ALONE: If patient is unable to contact 911 operator, consider using [facility], or when the need is there, arrange for someone to stay with patients. Smoking is a fire hazard, consider supervision or cessation. Risk of wandering should be assessed by caregiver and if detected at any point, supervision and safe proof recommendations should be instituted.  MEDICATION SUPERVISION: Inability to self-administer medication needs to be constantly addressed. Implement a mechanism to ensure safe administration of the medications.   DRIVING: Regarding driving, in patients with progressive memory problems, driving will be impaired. We advise to have someone else do the driving if trouble finding directions or if minor accidents are reported. Independent driving assessment is available to determine safety of driving.   If you are interested in the driving assessment, you can contact the following:  [facility] [employer] [facility] in [place] [phone]  [facility] [phone]  [facility] [phone] or [phone]       Mediterranean Diet A Mediterranean diet refers to food and lifestyle choices that are based on the traditions of countries located on the [employer]. This way of eating has been shown to help prevent certain conditions and improve outcomes for people who have chronic diseases, like kidney disease and heart disease. What are tips for following this plan? Lifestyle  Cook and eat meals together with your family, when possible. Drink enough fluid to keep your urine clear or pale yellow. Be physically active every day. This includes: Aerobic exercise like running or swimming. Leisure activities like gardening, walking, or housework. Get 7-8 hours of sleep each night. If recommended by your health care provider, drink red wine in moderation. This means 1 glass a day for nonpregnant women and 2 glasses a day for men. A glass of wine equals 5 oz (150 mL). Reading food labels  Check the serving size of packaged foods. For foods such as rice and pasta, the serving size refers to the amount of cooked product, not dry. Check the total fat in packaged foods. Avoid foods that have saturated fat or trans fats. Check the ingredients list for added sugars, such as corn syrup. Shopping  At the grocery store, buy most of your food from the areas near the walls of the store. This includes: Fresh fruits and vegetables (produce). Grains, beans, nuts, and seeds. Some of these may be available in unpackaged forms or large amounts (in bulk). Fresh seafood. Poultry and eggs. Low-fat dairy products.  Buy whole ingredients instead of prepackaged foods. Buy fresh fruits and vegetables in-season from local farmers markets. Buy frozen fruits and vegetables in resealable bags. If you do not have access to quality fresh seafood, buy precooked frozen shrimp or canned fish, such as tuna, salmon, or sardines. Buy small amounts of raw or cooked vegetables, salads, or olives from the deli or salad bar at your  store. Stock your pantry so you always have certain foods on hand, such as olive oil, canned tuna, canned tomatoes, rice, pasta, and beans. Cooking  Cook foods with extra-virgin olive oil instead of using butter or other vegetable oils. Have meat as a side dish, and have vegetables or grains as your main dish. This means having meat in small portions or adding small amounts of meat to foods like pasta or stew. Use beans or vegetables instead of meat in common dishes like chili or lasagna. Experiment with different cooking methods. Try roasting or broiling vegetables instead of steaming or sauteing them. Add frozen vegetables to soups, stews, pasta, or rice. Add nuts or seeds for added healthy fat at each meal. You can add these to yogurt, salads, or vegetable dishes. Marinate fish or vegetables using olive oil, lemon juice, garlic, and fresh herbs. Meal planning  Plan to eat 1 vegetarian meal one day each week. Try to work up to 2 vegetarian meals, if possible. Eat seafood 2 or more times a week. Have healthy snacks readily available, such as: Vegetable sticks with hummus. Greek yogurt. Fruit and nut trail mix. Eat balanced meals throughout the week. This includes: Fruit: 2-3 servings a day Vegetables: 4-5 servings a day Low-fat dairy: 2 servings a day Fish, poultry, or lean meat: 1 serving a day Beans and legumes: 2 or more servings a week Nuts and seeds: 1-2 servings a day Whole grains: 6-8 servings a day Extra-virgin olive oil: 3-4 servings a day Limit red meat and sweets to only a few servings a month What are my food choices? Mediterranean diet Recommended Grains: Whole-grain pasta. Brown rice. Bulgar wheat. Polenta. Couscous. Whole-wheat bread. Orpah Cobb. Vegetables: Artichokes. Beets. Broccoli. Cabbage. Carrots. Eggplant. Green beans. Chard. Kale. Spinach. Onions. Leeks. Peas. Squash. Tomatoes. Peppers. Radishes. Fruits: Apples. Apricots. Avocado. Berries. Bananas.  Cherries. Dates. Figs. Grapes. Lemons. Melon. Oranges. Peaches. Plums. Pomegranate. Meats and other protein foods: Beans. Almonds. Sunflower seeds. Pine nuts. Peanuts. Cod. Salmon. Scallops. Shrimp. Tuna. Tilapia. Clams. Oysters. Eggs. Dairy: Low-fat milk. Cheese. Greek yogurt. Beverages: Water. Red wine. Herbal tea. Fats and oils: Extra virgin olive oil. Avocado oil. Grape seed oil. Sweets and desserts: [place] yogurt with honey. Baked apples. Poached pears. Trail mix. Seasoning and other foods: Basil. Cilantro. Coriander. Cumin. Mint. Parsley. Sage. Rosemary. Tarragon. Garlic. Oregano. Thyme. Pepper. Balsalmic vinegar. Tahini. Hummus. Tomato sauce. Olives. Mushrooms. Limit these Grains: Prepackaged pasta or rice dishes. Prepackaged cereal with added sugar. Vegetables: Deep fried potatoes (french fries). Fruits: Fruit canned in syrup. Meats and other protein foods: Beef. Pork. Lamb. Poultry with skin. Hot dogs. Tomasa Blase. Dairy: Ice cream. Sour cream. Whole milk. Beverages: Juice. Sugar-sweetened soft drinks. Beer. Liquor and spirits. Fats and oils: Butter. Canola oil. Vegetable oil. Beef fat (tallow). Lard. Sweets and desserts: Cookies. Cakes. Pies. Candy. Seasoning and other foods: Mayonnaise. Premade sauces and marinades. The items listed may not be a complete list. Talk with your dietitian about what dietary choices are right for you. Summary The Mediterranean diet includes both food and lifestyle choices. Eat a variety of fresh fruits and vegetables, beans,  nuts, seeds, and whole grains. Limit the amount of red meat and sweets that you eat. Talk with your health care provider about whether it is safe for you to drink red wine in moderation. This means 1 glass a day for nonpregnant women and 2 glasses a day for men. A glass of wine equals 5 oz (150 mL). This information is not intended to replace advice given to you by your health care provider. Make sure you discuss any questions you have with  your health care provider. Document Released: 08/16/2015 Document Revised: 09/18/2015 Document Reviewed: 08/16/2015 Elsevier Interactive Patient Education  2017 [employer].

## 2023-03-09 NOTE — Progress Notes (Signed)
 Assessment/Plan:   Dementia likely due to Alzheimer's disease with behavioral disturbance  Marilyn Cook is a very pleasant 78 y.o. RH female with a history of hypertension, hyperlipidemia, TIA 2017 and a diagnosis of dementia due to Alzheimer's disease as per neuropsychological evaluation on 12/19/2021 seen today in follow up for memory loss. Patient is currently on memantine 10 mg twice daily.  Cognitive decline is noted, discussed initiating rivastigmine as she had some side effects with donepezil.  Son is interested in proceeding.  He is going to send [place] a note regarding our observations that he could not discuss in front of the patient.  For hallucinations and sundowning, she is on Depakote 125 mg twice daily, and for depression she is on Zoloft daily, tolerating well.    Follow up in 6  months. Continue memantine 10 mg twice daily Start rivastigmine 1.5 mg bid as directed, side effects discussed Continue Depakote 125 mg twice daily for visual hallucinations and sundowning Recommend good control of her cardiovascular risk factors Continue to control mood as per PCP sertraline and clonazepam      Subjective:    This patient is accompanied in the office by her son who supplements the history.  Previous records as well as any outside records available were reviewed prior to todays visit. Patient was last seen on 09/05/2022   Any changes in memory since last visit? "Except for last week when she had suspected UTI, she is doing fine"-son says.  She has little insight into her condition.  She reports being able to remember people's names, but not as well as before.  She has some difficulty with conversations and new information, long-term memory is fair  repeats oneself?  Endorsed, frequently Disoriented when walking into a room?  Patient denies.    Leaving objects?  May misplace things but not in unusual places.  Her son reports that she may hoard some objects, such as straws, vitamins,  plastic forks and knives  Wandering behavior?  denies   Any personality changes since last visit?  denies   Any worsening depression?:  Denies.   Hallucinations or paranoia? Better controlled with medication.  Sometimes she sees a man in the window, an woman sitting at the bedpost with her baby. Seizures? denies    Any sleep changes?  She sleeps well. She reports vivid dreams, denies REM behavior or sleepwalking.  She likes to go to sleep with her clothes on. Sleep apnea?   Denies.   Any hygiene concerns?  She needs a reminder to shower. Independent of bathing and dressing?  Endorsed  Does the patient needs help with medications?  Son is in charge   Who is in charge of the finances?  Son  is in charge     Any changes in appetite?  They may be decreased Patient have trouble swallowing? Denies.   Does the patient cook? No Any headaches?   denies   Chronic back pain  denies   Ambulates with difficulty? Denies.   Recent falls or head injuries? denies     Unilateral weakness, numbness or tingling? denies   Any tremors?  Denies   Any anosmia?  Denies   Any incontinence of urine?  Endorsed, wears diapers. Possible recent UTI 1 week ago. Wears Depends Any bowel dysfunction?   Denies      Patient lives at home with her son next-door. Someone comes once a week to interact with her  Does the patient drive? No longer drives  Initial evaluation 07/25/2021 the patient is accompanied by her son who supplements the history.     How long did patient have memory difficulties? She denies any memory changes, but her son reports that since Mar 11, 2023 after her husband died. Son was concerned because about 1 month ago she did not remember the 4 digits of the house alarm "was trying to do 3 digits, but later that afternoon, she remembered that there were 4 digits".  "She used to be a whiz with numbers, not now ".  Most of the concerns are due to short-term memory.  There was an episode recently, where her son told  the story, and immediately, she repeated the story without recall that her son had just said the same thing. Patient lives with:  Patient lives alone after losing her husband but her son lives next door repeats oneself?  "Over and over for years, but we always joked about it, never made much of it ".  Disoriented when walking into a room?  Patient denies   Leaving objects in unusual places?  Patient denies   Ambulates  with difficulty?   Patient denies   Recent falls?  Patient denies   Any head injuries?  Patient denies   History of seizures?   Patient denies. She was evaluated in 2017 for transient alteration of awareness but it was negative for seizures. Wandering behavior?  Patient denies   Patient drives?   Denies any issues, tries to stay off of the highway.  Son reports that she is a very good driver to date. Any mood changes such irritability agitation?  Denies  Any history of depression?:  Patient denies   Hallucinations? Son reports she sees things, like a light at nigh and a ring around it, but one da instead of basketball, she thought she saw a person.  Sometimes she states is shaped like a leaf, but she thinks it is an Agricultural consultant.  Her son is not sure if this is an hallucination or is distorted image because of her history of glaucoma.  If she senses that is something moving in the woods, she is afraid to go outside. Paranoia?  "She was paranoid about a neighbor for a while, but even after his death, and other people moving into that house she is still gets anxious about passing by that house ".  She is also afraid of the darkness, and does not like to go downstairs to the laundry room, so she washes all her clothing by hand although she is working on it ".   Patient reports that he sleeps well without vivid dreams, REM behavior or sleepwalking   History of sleep apnea?  Patient denies   Any hygiene concerns?  Denies Independent of bathing and dressing?  Endorsed  Does the patient  needs help with medications? Son monitors, she has a pill tray  Who is in charge of the finances?  Patient is in charge but son monitors, she doesn't like [employer] so she would not pay so she got behind.  "She doesn't like to spend money ". Any changes in appetite?  "Never has been a good eater ".  Patient have trouble swallowing? Patient denies   Does the patient cook?  Patient denies   Any kitchen accidents such as leaving the stove on? Patient denies   Any headaches?  Patient denies   Double vision? Patient denies   Any focal numbness or tingling?  Patient denies   Chronic back pain Patient denies  Unilateral weakness?  Patient denies   Any tremors?  Patient denies   Any history of anosmia?  Patient denies   Any incontinence of urine?  Patient denies   Any bowel dysfunction?   Patient denies    History of heavy alcohol intake?  Patient denies   History of heavy tobacco use?  Patient denies   Family history of dementia?   Aunt with dementia   PREVIOUS MEDICATIONS:   CURRENT MEDICATIONS:  Outpatient Encounter Medications as of 03/09/2023  Medication Sig   atorvastatin (LIPITOR) 40 MG tablet Take 1 tablet (40 mg total) by mouth daily.   Calcium Carb-Cholecalciferol (CALCIUM-VITAMIN D) 600-400 MG-UNIT TABS Take 1 tablet by mouth 2 (two) times daily.   cetirizine (ZYRTEC) 10 MG tablet Take 10 mg by mouth daily.   clopidogrel (PLAVIX) 75 MG tablet Take 1 tablet (75 mg total) by mouth daily.   co-enzyme Q-10 30 MG capsule Take 30 mg by mouth daily.   cyanocobalamin (VITAMIN B12) 500 MCG tablet Take 1 tablet (500 mcg total) by mouth daily.   denosumab (PROLIA) 60 MG/ML SOSY injection Inject 60 mg into the skin every 6 (six) months.   divalproex (DEPAKOTE) 125 MG DR tablet TAKE 1 TABLET (125 MG TOTAL) BY MOUTH AT BEDTIME   hydrocortisone 2.5 % cream Apply 1 application  topically 2 (two) times daily as needed (itching).   levothyroxine (SYNTHROID) 25 MCG tablet Take 1 tablet (25 mcg total)  by mouth daily before breakfast.   Magnesium 250 MG CAPS Take 1 capsule by mouth at bedtime.   memantine (NAMENDA) 10 MG tablet TAKE 1 TABLET BY MOUTH TWICE A DAY   Potassium 99 MG TABS Take 1 tablet (99 mg total) by mouth daily.   Pyridoxine HCl (VITAMIN B-6 PO) Take 1 tablet by mouth daily.   sertraline (ZOLOFT) 25 MG tablet Take 1 tablet (25 mg total) by mouth daily.   triamcinolone (KENALOG) 0.147 MG/GM topical spray Apply topically 2 (two) times daily as needed (psoriasis rash on scalp).   Facility-Administered Encounter Medications as of 03/09/2023  Medication   [START ON 08/11/2023] denosumab (PROLIA) injection 60 mg       02/17/2020    1:48 PM 02/14/2019    3:01 PM  MMSE - Mini Mental State Exam  Not completed: Unable to complete   Orientation to time  5  Orientation to Place  5  Registration  3  Attention/ Calculation  5  Recall  3  Language- repeat  1      08/02/2021   12:00 PM  Montreal Cognitive Assessment   Visuospatial/ Executive (0/5) 2  Naming (0/3) 2  Attention: Read list of digits (0/2) 2  Attention: Read list of letters (0/1) 1  Attention: Serial 7 subtraction starting at 100 (0/3) 3  Language: Repeat phrase (0/2) 1  Language : Fluency (0/1) 0  Abstraction (0/2) 0  Delayed Recall (0/5) 0  Orientation (0/6) 4  Total 15  Adjusted Score (based on education) 15    Objective:     PHYSICAL EXAMINATION:    VITALS:   Vitals:   03/09/23 0900  BP: 122/68  Pulse: 96  Resp: 20  SpO2: 99%  Weight: 127 lb (57.6 kg)  Height: 5' 1.75" (1.568 m)    GEN:  The patient appears stated age and is in NAD. HEENT:  Normocephalic, atraumatic.   Neurological examination:  General: NAD, well-groomed, appears stated age. Orientation: The patient is alert. Oriented to person, not to place and  date Cranial nerves: There is good facial symmetry.The speech is fluent and clear. No aphasia or dysarthria. Fund of knowledge is reduced. Recent and remote memory are impaired.  Attention and concentration are reduced.  Able to name objects and repeat phrases.  Hearing is intact to conversational tone. Sensation: Sensation is intact to light touch throughout Motor: Strength is at least antigravity x4. DTR's 2/4 in UE/LE     Movement examination: Tone: There is normal tone in the UE/LE Abnormal movements:  no tremor.  No myoclonus.  No asterixis.   Coordination:  There is no decremation with RAM's. Normal finger to nose  Gait and Station: The patient has no difficulty arising out of a deep-seated chair without the use of the hands. The patient's stride length is good.  Gait is cautious and narrow.    Thank you for allowing [place] the opportunity to participate in the care of this nice patient. Please do not hesitate to contact us for any questions or concerns.   Total time spent on today's visit was 31 minutes dedicated to this patient today, preparing to see patient, examining the patient, ordering tests and/or medications and counseling the patient, documenting clinical information in the EHR or other health record, independently interpreting results and communicating results to the patient/family, discussing treatment and goals, answering patient's questions and coordinating care.  Cc:  Eustaquio Boyden, MD  Marlowe Kays 03/09/2023 9:24 AM

## 2023-03-22 ENCOUNTER — Emergency Department (HOSPITAL_COMMUNITY)

## 2023-03-22 ENCOUNTER — Inpatient Hospital Stay (HOSPITAL_COMMUNITY)
Admission: EM | Admit: 2023-03-22 | Discharge: 2023-03-24 | DRG: 312 | Disposition: A | Discharge status: Home / Self Care | Attending: Internal Medicine | Admitting: Internal Medicine

## 2023-03-22 DIAGNOSIS — I951 Orthostatic hypotension: Principal | ICD-10-CM

## 2023-03-22 DIAGNOSIS — I10 Essential (primary) hypertension: Secondary | ICD-10-CM

## 2023-03-22 DIAGNOSIS — F03918 Unspecified dementia, unspecified severity, with other behavioral disturbance: Secondary | ICD-10-CM

## 2023-03-22 DIAGNOSIS — F02A4 Dementia in other diseases classified elsewhere, mild, with anxiety: Secondary | ICD-10-CM | POA: Diagnosis present

## 2023-03-22 DIAGNOSIS — G309 Alzheimer's disease, unspecified: Secondary | ICD-10-CM | POA: Diagnosis present

## 2023-03-22 DIAGNOSIS — I700 Atherosclerosis of aorta: Secondary | ICD-10-CM | POA: Diagnosis present

## 2023-03-22 DIAGNOSIS — Z79890 Hormone replacement therapy (postmenopausal): Secondary | ICD-10-CM

## 2023-03-22 DIAGNOSIS — Z8673 Personal history of transient ischemic attack (TIA), and cerebral infarction without residual deficits: Secondary | ICD-10-CM

## 2023-03-22 DIAGNOSIS — I679 Cerebrovascular disease, unspecified: Secondary | ICD-10-CM | POA: Diagnosis present

## 2023-03-22 DIAGNOSIS — E039 Hypothyroidism, unspecified: Secondary | ICD-10-CM

## 2023-03-22 DIAGNOSIS — Z7902 Long term (current) use of antithrombotics/antiplatelets: Secondary | ICD-10-CM

## 2023-03-22 DIAGNOSIS — Z91012 Allergy to eggs: Secondary | ICD-10-CM

## 2023-03-22 DIAGNOSIS — I6529 Occlusion and stenosis of unspecified carotid artery: Secondary | ICD-10-CM | POA: Diagnosis not present

## 2023-03-22 DIAGNOSIS — Z8249 Family history of ischemic heart disease and other diseases of the circulatory system: Secondary | ICD-10-CM

## 2023-03-22 DIAGNOSIS — Z043 Encounter for examination and observation following other accident: Secondary | ICD-10-CM | POA: Diagnosis not present

## 2023-03-22 DIAGNOSIS — Z79899 Other long term (current) drug therapy: Secondary | ICD-10-CM

## 2023-03-22 DIAGNOSIS — Z79620 Long term (current) use of immunosuppressive biologic: Secondary | ICD-10-CM

## 2023-03-22 DIAGNOSIS — M810 Age-related osteoporosis without current pathological fracture: Secondary | ICD-10-CM | POA: Diagnosis present

## 2023-03-22 DIAGNOSIS — S0990XA Unspecified injury of head, initial encounter: Secondary | ICD-10-CM | POA: Diagnosis not present

## 2023-03-22 DIAGNOSIS — Z91040 Latex allergy status: Secondary | ICD-10-CM

## 2023-03-22 DIAGNOSIS — N390 Urinary tract infection, site not specified: Secondary | ICD-10-CM

## 2023-03-22 DIAGNOSIS — F411 Generalized anxiety disorder: Secondary | ICD-10-CM | POA: Diagnosis present

## 2023-03-22 DIAGNOSIS — Z882 Allergy status to sulfonamides status: Secondary | ICD-10-CM

## 2023-03-22 DIAGNOSIS — I672 Cerebral atherosclerosis: Secondary | ICD-10-CM | POA: Diagnosis not present

## 2023-03-22 DIAGNOSIS — Z8042 Family history of malignant neoplasm of prostate: Secondary | ICD-10-CM

## 2023-03-22 DIAGNOSIS — Z888 Allergy status to other drugs, medicaments and biological substances status: Secondary | ICD-10-CM

## 2023-03-22 DIAGNOSIS — S199XXA Unspecified injury of neck, initial encounter: Secondary | ICD-10-CM | POA: Diagnosis not present

## 2023-03-22 DIAGNOSIS — W1830XA Fall on same level, unspecified, initial encounter: Secondary | ICD-10-CM

## 2023-03-22 DIAGNOSIS — Z880 Allergy status to penicillin: Secondary | ICD-10-CM

## 2023-03-22 DIAGNOSIS — Z800 Family history of malignant neoplasm of digestive organs: Secondary | ICD-10-CM

## 2023-03-22 DIAGNOSIS — F02A18 Dementia in other diseases classified elsewhere, mild, with other behavioral disturbance: Secondary | ICD-10-CM | POA: Diagnosis present

## 2023-03-22 DIAGNOSIS — Z886 Allergy status to analgesic agent status: Secondary | ICD-10-CM

## 2023-03-22 DIAGNOSIS — F0280 Dementia in other diseases classified elsewhere without behavioral disturbance: Secondary | ICD-10-CM | POA: Diagnosis present

## 2023-03-22 DIAGNOSIS — E785 Hyperlipidemia, unspecified: Secondary | ICD-10-CM | POA: Diagnosis present

## 2023-03-22 DIAGNOSIS — Z90711 Acquired absence of uterus with remaining cervical stump: Secondary | ICD-10-CM

## 2023-03-22 DIAGNOSIS — R55 Syncope and collapse: Principal | ICD-10-CM | POA: Diagnosis present

## 2023-03-22 DIAGNOSIS — Z823 Family history of stroke: Secondary | ICD-10-CM

## 2023-03-22 LAB — APTT: aPTT: 34 s (ref 24–36)

## 2023-03-22 LAB — URINALYSIS, ROUTINE W REFLEX MICROSCOPIC
Bilirubin Urine: NEGATIVE
Glucose, UA: NEGATIVE mg/dL
Ketones, ur: NEGATIVE mg/dL
Nitrite: NEGATIVE
Protein, ur: NEGATIVE mg/dL
Specific Gravity, Urine: 1.005 (ref 1.005–1.030)
pH: 7 (ref 5.0–8.0)

## 2023-03-22 LAB — COMPREHENSIVE METABOLIC PANEL
ALT: 21 U/L (ref 0–44)
AST: 31 U/L (ref 15–41)
Albumin: 3.6 g/dL (ref 3.5–5.0)
Alkaline Phosphatase: 71 U/L (ref 38–126)
Anion gap: 10 (ref 5–15)
BUN: 5 mg/dL — ABNORMAL LOW (ref 8–23)
CO2: 26 mmol/L (ref 22–32)
Calcium: 7.9 mg/dL — ABNORMAL LOW (ref 8.9–10.3)
Chloride: 105 mmol/L (ref 98–111)
Creatinine, Ser: 0.65 mg/dL (ref 0.44–1.00)
GFR, Estimated: >60 mL/min (ref >60)
Glucose, Bld: 96 mg/dL (ref 70–99)
Potassium: 3.7 mmol/L (ref 3.5–5.1)
Sodium: 141 mmol/L (ref 135–145)
Total Bilirubin: 0.6 mg/dL (ref 0.0–1.2)
Total Protein: 6.8 g/dL (ref 6.5–8.1)

## 2023-03-22 LAB — PROTIME-INR
INR: 1.3 — ABNORMAL HIGH (ref 0.8–1.2)
Prothrombin Time: 16.2 s — ABNORMAL HIGH (ref 11.4–15.2)

## 2023-03-22 LAB — CBC WITH DIFFERENTIAL/PLATELET
Abs Immature Granulocytes: 0.01 10*3/uL (ref 0.00–0.07)
Basophils Absolute: 0 10*3/uL (ref 0.0–0.1)
Basophils Relative: 1 %
Eosinophils Absolute: 0.1 10*3/uL (ref 0.0–0.5)
Eosinophils Relative: 2 %
HCT: 43.7 % (ref 36.0–46.0)
Hemoglobin: 14.7 g/dL (ref 12.0–15.0)
Immature Granulocytes: 0 %
Lymphocytes Relative: 33 %
Lymphs Abs: 1.4 10*3/uL (ref 0.7–4.0)
MCH: 32.9 pg (ref 26.0–34.0)
MCHC: 33.6 g/dL (ref 30.0–36.0)
MCV: 97.8 fL (ref 80.0–100.0)
Monocytes Absolute: 0.5 10*3/uL (ref 0.1–1.0)
Monocytes Relative: 12 %
Neutro Abs: 2.3 10*3/uL (ref 1.7–7.7)
Neutrophils Relative %: 52 %
Platelets: 218 10*3/uL (ref 150–400)
RBC: 4.47 MIL/uL (ref 3.87–5.11)
RDW: 12.4 % (ref 11.5–15.5)
WBC: 4.3 10*3/uL (ref 4.0–10.5)
nRBC: 0 % (ref 0.0–0.2)

## 2023-03-22 LAB — TROPONIN I (HIGH SENSITIVITY): Troponin I (High Sensitivity): <2 ng/L (ref <18)

## 2023-03-22 LAB — MAGNESIUM: Magnesium: 2.2 mg/dL (ref 1.7–2.4)

## 2023-03-22 NOTE — Progress Notes (Signed)
 Chaplain responds to Level 2 trauma, fall on thinners with head strike, and provides compassionate presence and support to pt and escorts her son [name] to bedside. They deny further needs.

## 2023-03-22 NOTE — ED Notes (Signed)
 Up walking to bathroom with 1 person assist, gait steady.

## 2023-03-22 NOTE — ED Triage Notes (Signed)
 Pt BIB [facility] from home due to syncopal episode.  Pt landed on right side of face; bruising noted to right side of face and right eye.  Pt in EMS C-collar.  Pt did take Plavix dose this morning.  18g left AC.   VS BP 159/58, HR 76, CBG 141, Temp 96.9

## 2023-03-22 NOTE — ED Notes (Signed)
 Pt ambulated to the bathroom with no support from tech.

## 2023-03-22 NOTE — ED Provider Notes (Signed)
 [facility] EMERGENCY DEPARTMENT AT [facility] Provider Note   CSN: [id number] Arrival date & time: 03/22/23  1900     History  No chief complaint on file.   Marilyn Cook is a 78 y.o. female presented as a level 2 trauma who was standing and playing pool with her sons today when she had syncopal episode.  Event was witnessed by her son who said she folded and collapsed, hitting the right side of her eyes/face.  Patient then had episodes of emesis, worse on rolled her on her side so she did not aspirate.  She had no episodes of shaking, and following emesis, she was able to verbalize to her son.  By that time EMS had been called.  There was no report of a postictal state.  history of hypertension, hyperlipidemia, TIA 2017 and a diagnosis of dementia due to Alzheimer's disease   HPI     Home Medications Prior to Admission medications   Medication Sig Start Date End Date Taking? Authorizing Provider  atorvastatin (LIPITOR) 40 MG tablet Take 1 tablet (40 mg total) by mouth daily. 08/18/22  Yes Eustaquio Boyden, MD  clopidogrel (PLAVIX) 75 MG tablet Take 1 tablet (75 mg total) by mouth daily. 08/18/22  Yes Eustaquio Boyden, MD  cyanocobalamin (VITAMIN B12) 500 MCG tablet Take 1 tablet (500 mcg total) by mouth daily. 09/25/21  Yes Eustaquio Boyden, MD  denosumab (PROLIA) 60 MG/ML SOSY injection Inject 60 mg into the skin every 6 (six) months. 10/15/22  Yes Eustaquio Boyden, MD  divalproex (DEPAKOTE) 125 MG DR tablet TAKE 1 TABLET (125 MG TOTAL) BY MOUTH AT BEDTIME Patient taking differently: Take 125 mg by mouth daily. 11/10/22  Yes Marcos Eke, PA-C  levothyroxine (SYNTHROID) 25 MCG tablet Take 1 tablet (25 mcg total) by mouth daily before breakfast. 08/18/22  Yes Eustaquio Boyden, MD  loratadine (CLARITIN) 10 MG tablet Take 10 mg by mouth daily.   Yes [provider]  Magnesium 250 MG CAPS Take 1 capsule by mouth at bedtime. 02/23/23  Yes Eustaquio Boyden, MD   memantine (NAMENDA) 10 MG tablet TAKE 1 TABLET BY MOUTH TWICE A DAY 09/05/22  Yes Marcos Eke, PA-C  Pyridoxine HCl (VITAMIN B-6 PO) Take 1 tablet by mouth daily.   Yes [provider]  rivastigmine (EXELON) 1.5 MG capsule Take 1 cap at night for 2 weeks, then increase to 1 cap twice daily 03/09/23  Yes Wertman, Sung Amabile, PA-C  sertraline (ZOLOFT) 25 MG tablet Take 1 tablet (25 mg total) by mouth daily. 08/18/22  Yes Eustaquio Boyden, MD  co-enzyme Q-10 30 MG capsule Take 30 mg by mouth daily. Patient not taking: Reported on 03/22/2023    [provider]  Potassium 99 MG TABS Take 1 tablet (99 mg total) by mouth daily. Patient not taking: Reported on 03/22/2023 09/25/21   Eustaquio Boyden, MD  triamcinolone (KENALOG) 0.147 MG/GM topical spray Apply topically 2 (two) times daily as needed (psoriasis rash on scalp). Patient not taking: Reported on 03/22/2023 08/18/22   Eustaquio Boyden, MD      Allergies    Aspirin, Diphenhydramine hcl, Donepezil, Egg-derived products, Epinephrine, Latex, Other, Penicillins, and Sulfasalazine    Review of Systems   Review of Systems  Physical Exam Updated Vital Signs BP (!) 140/72   Pulse 78   Temp 98 F (36.7 C) (Oral)   Resp 16   Ht 5\' 6"  (1.676 m)   Wt 58 kg   SpO2 100%  BMI 20.64 kg/m  Physical Exam Vitals and nursing note reviewed.  Constitutional:      General: She is not in acute distress.    Appearance: Normal appearance.  HENT:     Head: Normocephalic.     Comments: Ecchymosis to the right superior lateral eye and inferior lateral eye    Ears:     Comments: Atraumatic    Nose: Nose normal. No nasal deformity.     Comments: Atraumatic and not tender to palpation    Mouth/Throat:     Lips: Pink. No lesions.     Mouth: Mucous membranes are moist. No injury.     Tongue: No lesions. Tongue does not deviate from midline.     Pharynx: Oropharynx is clear. Uvula midline.  Eyes:     Extraocular Movements: Extraocular  movements intact.     Conjunctiva/sclera: Conjunctivae normal.     Pupils: Pupils are equal, round, and reactive to light. Pupils are equal.  Neck:     Comments: C-collar in place Cardiovascular:     Rate and Rhythm: Normal rate and regular rhythm.     Pulses:          Radial pulses are 2+ on the right side and 2+ on the left side.       Dorsalis pedis pulses are 2+ on the right side and 2+ on the left side.     Heart sounds: Normal heart sounds.  Pulmonary:     Effort: Pulmonary effort is normal.     Breath sounds: Normal breath sounds.  Abdominal:     General: Abdomen is flat.     Palpations: Abdomen is soft.     Tenderness: There is no abdominal tenderness.  Musculoskeletal:     Right lower leg: No edema.     Left lower leg: No edema.     Comments: No deformities or tenderness to palpation of upper or lower extremities  Skin:    Findings: No abrasion or ecchymosis.  Neurological:     Mental Status: She is alert.  Psychiatric:        Behavior: Behavior is cooperative.     ED Results / Procedures / Treatments   Labs (all labs ordered are listed, but only abnormal results are displayed) Labs Reviewed  COMPREHENSIVE METABOLIC PANEL - Abnormal; Notable for the following components:      Result Value   BUN 5 (*)    Calcium 7.9 (*)    All other components within normal limits  URINALYSIS, ROUTINE W REFLEX MICROSCOPIC - Abnormal; Notable for the following components:   Hgb urine dipstick SMALL (*)    Leukocytes,Ua LARGE (*)    Bacteria, UA RARE (*)    All other components within normal limits  PROTIME-INR - Abnormal; Notable for the following components:   Prothrombin Time 16.2 (*)    INR 1.3 (*)    All other components within normal limits  CBC WITH DIFFERENTIAL/PLATELET  MAGNESIUM  APTT  TROPONIN I (HIGH SENSITIVITY)  TROPONIN I (HIGH SENSITIVITY)    EKG EKG Interpretation Date/Time:  Sunday March 22 2023 21:27:11 EDT Ventricular Rate:  73 PR  Interval:  138 QRS Duration:  85 QT Interval:  399 QTC Calculation: 440 R Axis:   50  Text Interpretation: Sinus rhythm Low voltage, precordial leads no significant change since 2022 Confirmed by Pricilla Loveless [id number]) on 03/22/2023 9:37:42 PM  Radiology CT Head Wo Contrast Result Date: 03/22/2023 CLINICAL DATA:  Neck trauma, fall, on blood thinners, level  2 trauma, head trauma EXAM: CT HEAD WITHOUT CONTRAST CT CERVICAL SPINE WITHOUT CONTRAST TECHNIQUE: Multidetector CT imaging of the head and cervical spine was performed following the standard protocol without intravenous contrast. Multiplanar CT image reconstructions of the cervical spine were also generated. RADIATION DOSE REDUCTION: This exam was performed according to the departmental dose-optimization program which includes automated exposure control, adjustment of the mA and/or kV according to patient size and/or use of iterative reconstruction technique. COMPARISON:  CT head 02/13/2022 FINDINGS: CT HEAD FINDINGS Brain: No intracranial hemorrhage, mass effect, or evidence of acute infarct. No hydrocephalus. No extra-axial fluid collection. Age related cerebral atrophy and chronic small vessel ischemic disease. Vascular: No hyperdense vessel. Intracranial arterial calcification. Skull: No fracture or focal lesion. Sinuses/Orbits: Frothy mucous in the left sphenoid sinus. The paranasal sinuses and mastoid air cells are otherwise well aerated. Other: None. CT CERVICAL SPINE FINDINGS Alignment: No evidence of traumatic malalignment. Skull base and vertebrae: No acute fracture. No primary bone lesion or focal pathologic process. Soft tissues and spinal canal: No prevertebral fluid or swelling. No visible canal hematoma. Disc levels: Multilevel spondylosis, disc space height loss, and degenerative endplate changes greatest at C4-C5 where it is moderate to advanced. Multilevel facet arthropathy with ankylosis of the right C2-C3 and C4-C5 facets. No severe  spinal canal narrowing. Upper chest: No acute abnormality. Other: Carotid calcification. IMPRESSION: 1. No acute intracranial abnormality. 2. No acute fracture in the cervical spine. Electronically Signed   By: Minerva Fester M.D.   On: 03/22/2023 20:08   CT Cervical Spine Wo Contrast Result Date: 03/22/2023 CLINICAL DATA:  Neck trauma, fall, on blood thinners, level 2 trauma, head trauma EXAM: CT HEAD WITHOUT CONTRAST CT CERVICAL SPINE WITHOUT CONTRAST TECHNIQUE: Multidetector CT imaging of the head and cervical spine was performed following the standard protocol without intravenous contrast. Multiplanar CT image reconstructions of the cervical spine were also generated. RADIATION DOSE REDUCTION: This exam was performed according to the departmental dose-optimization program which includes automated exposure control, adjustment of the mA and/or kV according to patient size and/or use of iterative reconstruction technique. COMPARISON:  CT head 02/13/2022 FINDINGS: CT HEAD FINDINGS Brain: No intracranial hemorrhage, mass effect, or evidence of acute infarct. No hydrocephalus. No extra-axial fluid collection. Age related cerebral atrophy and chronic small vessel ischemic disease. Vascular: No hyperdense vessel. Intracranial arterial calcification. Skull: No fracture or focal lesion. Sinuses/Orbits: Frothy mucous in the left sphenoid sinus. The paranasal sinuses and mastoid air cells are otherwise well aerated. Other: None. CT CERVICAL SPINE FINDINGS Alignment: No evidence of traumatic malalignment. Skull base and vertebrae: No acute fracture. No primary bone lesion or focal pathologic process. Soft tissues and spinal canal: No prevertebral fluid or swelling. No visible canal hematoma. Disc levels: Multilevel spondylosis, disc space height loss, and degenerative endplate changes greatest at C4-C5 where it is moderate to advanced. Multilevel facet arthropathy with ankylosis of the right C2-C3 and C4-C5 facets. No  severe spinal canal narrowing. Upper chest: No acute abnormality. Other: Carotid calcification. IMPRESSION: 1. No acute intracranial abnormality. 2. No acute fracture in the cervical spine. Electronically Signed   By: Minerva Fester M.D.   On: 03/22/2023 20:08   DG Pelvis Portable Result Date: 03/22/2023 CLINICAL DATA:  Status post fall. EXAM: PORTABLE PELVIS 1-2 VIEWS COMPARISON:  July 02, 2016 FINDINGS: There is no evidence of pelvic fracture or diastasis. No pelvic bone lesions are seen. IMPRESSION: Negative. Electronically Signed   By: Aram Candela M.D.   On: 03/22/2023 19:31  DG Chest Portable 1 View Result Date: 03/22/2023 CLINICAL DATA:  Status post fall. EXAM: PORTABLE CHEST 1 VIEW COMPARISON:  February 24, 2020 FINDINGS: The heart size and mediastinal contours are within normal limits. Both lungs are clear. Postoperative changes are seen involving the right shoulder. Multilevel degenerative changes are noted throughout the thoracic spine. IMPRESSION: No active cardiopulmonary disease. Electronically Signed   By: Aram Candela M.D.   On: 03/22/2023 19:30    Procedures Procedures    Medications Ordered in ED Medications - No data to display  ED Course/ Medical Decision Making/ A&P                                Medical Decision Making Amount and/or Complexity of Data Reviewed Labs: ordered. Radiology: ordered.  Risk Decision regarding hospitalization.   78 year old pleasant female with a PMHx of hypertension, hyperlipidemia, TIA 2017 and a diagnosis of dementia due to Alzheimer's disease presenting for ground-level fall as a level 2 trauma secondary to syncope.  Patient is amnestic to the event.  Only evidence of patient's GLF is a ecchymosis to right lateral superior and inferior eye.  C-collar in place on arrival.  Patient with no deformity or tenderness to palpation of bilateral upper or lower extremities.  Chest is stable to anterior and lateral palpation.  Abdomen  is soft nontender, pelvis is stable to lateral compression.  CXR and pelvic x-ray notable for no acute fractures or deformities.  Hips are located.  There is no evidence of focal consolidation concerning for pneumonia, no pneumothorax, trachea is midline  CT head and cervical spine with no evidence of intracranial hemorrhage, no skull fractures, no cervical fracture or malalignment.  C-collar cleared clinically following negative imaging.  Labs are reassuring with no leukocytosis, no anemia, no electrolyte or metabolic derangements, no AKI indicating dehydration, troponin is less than 2.  EKG with appropriate intervals and no evidence of STEMI.  Urinalysis does not appear to be infectious.  Given reassuring workup with concerning syncope, patient is believed to require admission for inpatient observation and syncope workup.  Handoff was called to the inpatient hospitalist team, Dr. Maisie Fus.  They requested orthostatics which have been ordered.  Final Clinical Impression(s) / ED Diagnoses Final diagnoses:  Syncope, unspecified syncope type  Ground-level fall    Rx / DC Orders ED Discharge Orders     None      Renella Cunas, PGY-2 Emergency Medicine   Renella Cunas, MD 03/22/23 2349    Pricilla Loveless, MD 03/26/23 [phone]

## 2023-03-22 NOTE — Progress Notes (Signed)
 Orthopedic Tech Progress Note Patient Details:  Marilyn Cook June 10, 1945 [id number]  Level II trauma, no specific ortho tech needs at this time.  Patient ID: [name], female   DOB: Mar 31, 1945, 78 y.o.   MRN: [id number]  Adley Castello 03/22/2023, 7:06 PM

## 2023-03-23 ENCOUNTER — Inpatient Hospital Stay (HOSPITAL_COMMUNITY)

## 2023-03-23 ENCOUNTER — Other Ambulatory Visit: Payer: Self-pay

## 2023-03-23 ENCOUNTER — Encounter (HOSPITAL_COMMUNITY): Payer: Self-pay | Admitting: Internal Medicine

## 2023-03-23 DIAGNOSIS — N390 Urinary tract infection, site not specified: Secondary | ICD-10-CM | POA: Diagnosis not present

## 2023-03-23 DIAGNOSIS — Z823 Family history of stroke: Secondary | ICD-10-CM | POA: Diagnosis not present

## 2023-03-23 DIAGNOSIS — R55 Syncope and collapse: Secondary | ICD-10-CM | POA: Diagnosis not present

## 2023-03-23 DIAGNOSIS — Z8249 Family history of ischemic heart disease and other diseases of the circulatory system: Secondary | ICD-10-CM | POA: Diagnosis not present

## 2023-03-23 DIAGNOSIS — Z7902 Long term (current) use of antithrombotics/antiplatelets: Secondary | ICD-10-CM | POA: Diagnosis not present

## 2023-03-23 DIAGNOSIS — W1830XA Fall on same level, unspecified, initial encounter: Secondary | ICD-10-CM | POA: Diagnosis not present

## 2023-03-23 DIAGNOSIS — Z8673 Personal history of transient ischemic attack (TIA), and cerebral infarction without residual deficits: Secondary | ICD-10-CM | POA: Diagnosis not present

## 2023-03-23 DIAGNOSIS — Z800 Family history of malignant neoplasm of digestive organs: Secondary | ICD-10-CM | POA: Diagnosis not present

## 2023-03-23 DIAGNOSIS — F02A18 Dementia in other diseases classified elsewhere, mild, with other behavioral disturbance: Secondary | ICD-10-CM | POA: Diagnosis not present

## 2023-03-23 DIAGNOSIS — E785 Hyperlipidemia, unspecified: Secondary | ICD-10-CM

## 2023-03-23 DIAGNOSIS — E039 Hypothyroidism, unspecified: Secondary | ICD-10-CM

## 2023-03-23 DIAGNOSIS — Z79899 Other long term (current) drug therapy: Secondary | ICD-10-CM | POA: Diagnosis not present

## 2023-03-23 DIAGNOSIS — Z888 Allergy status to other drugs, medicaments and biological substances status: Secondary | ICD-10-CM | POA: Diagnosis not present

## 2023-03-23 DIAGNOSIS — M810 Age-related osteoporosis without current pathological fracture: Secondary | ICD-10-CM | POA: Diagnosis not present

## 2023-03-23 DIAGNOSIS — Z79890 Hormone replacement therapy (postmenopausal): Secondary | ICD-10-CM | POA: Diagnosis not present

## 2023-03-23 DIAGNOSIS — Z882 Allergy status to sulfonamides status: Secondary | ICD-10-CM | POA: Diagnosis not present

## 2023-03-23 DIAGNOSIS — I679 Cerebrovascular disease, unspecified: Secondary | ICD-10-CM | POA: Diagnosis not present

## 2023-03-23 DIAGNOSIS — F0280 Dementia in other diseases classified elsewhere without behavioral disturbance: Secondary | ICD-10-CM | POA: Diagnosis not present

## 2023-03-23 DIAGNOSIS — G309 Alzheimer's disease, unspecified: Secondary | ICD-10-CM | POA: Diagnosis not present

## 2023-03-23 DIAGNOSIS — I10 Essential (primary) hypertension: Secondary | ICD-10-CM

## 2023-03-23 DIAGNOSIS — F02A4 Dementia in other diseases classified elsewhere, mild, with anxiety: Secondary | ICD-10-CM | POA: Diagnosis not present

## 2023-03-23 DIAGNOSIS — Z79620 Long term (current) use of immunosuppressive biologic: Secondary | ICD-10-CM | POA: Diagnosis not present

## 2023-03-23 DIAGNOSIS — F411 Generalized anxiety disorder: Secondary | ICD-10-CM | POA: Diagnosis not present

## 2023-03-23 DIAGNOSIS — I951 Orthostatic hypotension: Secondary | ICD-10-CM

## 2023-03-23 DIAGNOSIS — Z880 Allergy status to penicillin: Secondary | ICD-10-CM | POA: Diagnosis not present

## 2023-03-23 DIAGNOSIS — G319 Degenerative disease of nervous system, unspecified: Secondary | ICD-10-CM | POA: Diagnosis not present

## 2023-03-23 DIAGNOSIS — I700 Atherosclerosis of aorta: Secondary | ICD-10-CM | POA: Diagnosis not present

## 2023-03-23 DIAGNOSIS — I6782 Cerebral ischemia: Secondary | ICD-10-CM | POA: Diagnosis not present

## 2023-03-23 DIAGNOSIS — Z90711 Acquired absence of uterus with remaining cervical stump: Secondary | ICD-10-CM | POA: Diagnosis not present

## 2023-03-23 DIAGNOSIS — F03918 Unspecified dementia, unspecified severity, with other behavioral disturbance: Secondary | ICD-10-CM | POA: Diagnosis not present

## 2023-03-23 LAB — COMPREHENSIVE METABOLIC PANEL
ALT: 19 U/L (ref 0–44)
AST: 31 U/L (ref 15–41)
Albumin: 3.6 g/dL (ref 3.5–5.0)
Alkaline Phosphatase: 65 U/L (ref 38–126)
Anion gap: 10 (ref 5–15)
BUN: <5 mg/dL — ABNORMAL LOW (ref 8–23)
CO2: 23 mmol/L (ref 22–32)
Calcium: 7.6 mg/dL — ABNORMAL LOW (ref 8.9–10.3)
Chloride: 107 mmol/L (ref 98–111)
Creatinine, Ser: 0.68 mg/dL (ref 0.44–1.00)
GFR, Estimated: >60 mL/min (ref >60)
Glucose, Bld: 109 mg/dL — ABNORMAL HIGH (ref 70–99)
Potassium: 3.7 mmol/L (ref 3.5–5.1)
Sodium: 140 mmol/L (ref 135–145)
Total Bilirubin: 0.8 mg/dL (ref 0.0–1.2)
Total Protein: 6.6 g/dL (ref 6.5–8.1)

## 2023-03-23 LAB — ECHOCARDIOGRAM COMPLETE
AR max vel: 2.71 cm2
AV Area VTI: 2.89 cm2
AV Area mean vel: 2.72 cm2
AV Mean grad: 4 mmHg
AV Peak grad: 6.9 mmHg
Ao pk vel: 1.31 m/s
Area-P 1/2: 2.63 cm2
Height: 66 in
MV VTI: 3.88 cm2
S' Lateral: 2.6 cm
Weight: 2045.87 [oz_av]

## 2023-03-23 LAB — CBC
HCT: 44.1 % (ref 36.0–46.0)
Hemoglobin: 14.7 g/dL (ref 12.0–15.0)
MCH: 32.5 pg (ref 26.0–34.0)
MCHC: 33.3 g/dL (ref 30.0–36.0)
MCV: 97.4 fL (ref 80.0–100.0)
Platelets: 172 10*3/uL (ref 150–400)
RBC: 4.53 MIL/uL (ref 3.87–5.11)
RDW: 12.5 % (ref 11.5–15.5)
WBC: 5.9 10*3/uL (ref 4.0–10.5)
nRBC: 0 % (ref 0.0–0.2)

## 2023-03-23 LAB — CBG MONITORING, ED: Glucose-Capillary: 92 mg/dL (ref 70–99)

## 2023-03-23 LAB — TROPONIN I (HIGH SENSITIVITY): Troponin I (High Sensitivity): 3 ng/L (ref <18)

## 2023-03-23 LAB — TSH: TSH: 4.018 u[IU]/mL (ref 0.350–4.500)

## 2023-03-23 LAB — GLUCOSE, CAPILLARY: Glucose-Capillary: 85 mg/dL (ref 70–99)

## 2023-03-23 LAB — PHOSPHORUS: Phosphorus: 2.1 mg/dL — ABNORMAL LOW (ref 2.5–4.6)

## 2023-03-23 LAB — VALPROIC ACID LEVEL: Valproic Acid Lvl: 29 ug/mL — ABNORMAL LOW (ref 50.0–100.0)

## 2023-03-23 MED ORDER — VITAMIN B-6 25 MG PO TABS
25.0000 mg | ORAL_TABLET | Freq: Every day | ORAL | Status: DC
  Administered 2023-03-23 – 2023-03-24 (×2): 25 mg via ORAL
  Filled 2023-03-23 (×2): qty 1

## 2023-03-23 MED ORDER — RIVASTIGMINE TARTRATE 1.5 MG PO CAPS
1.5000 mg | ORAL_CAPSULE | Freq: Two times a day (BID) | ORAL | Status: DC
  Administered 2023-03-23 – 2023-03-24 (×3): 1.5 mg via ORAL
  Filled 2023-03-23 (×4): qty 1

## 2023-03-23 MED ORDER — ACETAMINOPHEN 650 MG RE SUPP: 650.0000 mg | Freq: Four times a day (QID) | RECTAL | Status: DC | PRN

## 2023-03-23 MED ORDER — SODIUM CHLORIDE 0.9 % IV SOLN
1.0000 g | INTRAVENOUS | Status: DC
  Administered 2023-03-23: 1 g via INTRAVENOUS
  Filled 2023-03-23: qty 10

## 2023-03-23 MED ORDER — ACETAMINOPHEN 325 MG PO TABS: 650.0000 mg | ORAL_TABLET | Freq: Four times a day (QID) | ORAL | Status: DC | PRN

## 2023-03-23 MED ORDER — SERTRALINE HCL 25 MG PO TABS
25.0000 mg | ORAL_TABLET | Freq: Every day | ORAL | Status: DC
  Administered 2023-03-23 – 2023-03-24 (×2): 25 mg via ORAL
  Filled 2023-03-23 (×2): qty 1

## 2023-03-23 MED ORDER — DIVALPROEX SODIUM 125 MG PO DR TAB
125.0000 mg | DELAYED_RELEASE_TABLET | Freq: Every day | ORAL | Status: DC
  Administered 2023-03-23 – 2023-03-24 (×2): 125 mg via ORAL
  Filled 2023-03-23 (×2): qty 1

## 2023-03-23 MED ORDER — HEPARIN SODIUM (PORCINE) 5000 UNIT/ML IJ SOLN
5000.0000 [IU] | Freq: Three times a day (TID) | INTRAMUSCULAR | Status: DC
  Administered 2023-03-23 (×2): 5000 [IU] via SUBCUTANEOUS
  Filled 2023-03-23 (×2): qty 1

## 2023-03-23 MED ORDER — ONDANSETRON HCL 4 MG PO TABS: 4.0000 mg | ORAL_TABLET | Freq: Four times a day (QID) | ORAL | Status: DC | PRN

## 2023-03-23 MED ORDER — LORATADINE 10 MG PO TABS
10.0000 mg | ORAL_TABLET | Freq: Every day | ORAL | Status: DC
  Administered 2023-03-23 – 2023-03-24 (×2): 10 mg via ORAL
  Filled 2023-03-23 (×2): qty 1

## 2023-03-23 MED ORDER — LEVOTHYROXINE SODIUM 25 MCG PO TABS
25.0000 ug | ORAL_TABLET | Freq: Every day | ORAL | Status: DC
  Administered 2023-03-23 – 2023-03-24 (×2): 25 ug via ORAL
  Filled 2023-03-23 (×2): qty 1

## 2023-03-23 MED ORDER — CLOPIDOGREL BISULFATE 75 MG PO TABS
75.0000 mg | ORAL_TABLET | Freq: Every day | ORAL | Status: DC
  Administered 2023-03-23 – 2023-03-24 (×2): 75 mg via ORAL
  Filled 2023-03-23 (×2): qty 1

## 2023-03-23 MED ORDER — SODIUM CHLORIDE 0.9 % IV SOLN: INTRAVENOUS | Status: AC

## 2023-03-23 MED ORDER — SODIUM CHLORIDE 0.9 % IV BOLUS
1000.0000 mL | Freq: Once | INTRAVENOUS | Status: AC
  Administered 2023-03-23: 1000 mL via INTRAVENOUS

## 2023-03-23 MED ORDER — SODIUM CHLORIDE 0.9 % IV SOLN
2.0000 g | INTRAVENOUS | Status: DC
  Administered 2023-03-23: 2 g via INTRAVENOUS
  Filled 2023-03-23: qty 20

## 2023-03-23 MED ORDER — ENOXAPARIN SODIUM 30 MG/0.3ML IJ SOSY
30.0000 mg | PREFILLED_SYRINGE | INTRAMUSCULAR | Status: DC
  Administered 2023-03-23: 30 mg via SUBCUTANEOUS
  Filled 2023-03-23: qty 0.3

## 2023-03-23 MED ORDER — ONDANSETRON HCL 4 MG/2ML IJ SOLN
4.0000 mg | Freq: Four times a day (QID) | INTRAMUSCULAR | Status: DC | PRN
Start: 2023-03-23 — End: 2023-03-24

## 2023-03-23 MED ORDER — ATORVASTATIN CALCIUM 40 MG PO TABS
40.0000 mg | ORAL_TABLET | Freq: Every day | ORAL | Status: DC
  Administered 2023-03-23 – 2023-03-24 (×2): 40 mg via ORAL
  Filled 2023-03-23 (×2): qty 1

## 2023-03-23 MED ORDER — SODIUM CHLORIDE 0.9 % IV BOLUS: 500.0000 mL | Freq: Once | INTRAVENOUS | Status: DC

## 2023-03-23 MED ORDER — SODIUM CHLORIDE 0.9% FLUSH
10.0000 mL | Freq: Two times a day (BID) | INTRAVENOUS | Status: DC
  Administered 2023-03-23 – 2023-03-24 (×2): 10 mL

## 2023-03-23 MED ORDER — VITAMIN B-12 1000 MCG PO TABS
500.0000 ug | ORAL_TABLET | Freq: Every day | ORAL | Status: DC
  Administered 2023-03-23 – 2023-03-24 (×2): 500 ug via ORAL
  Filled 2023-03-23 (×2): qty 1

## 2023-03-23 MED ORDER — MEMANTINE HCL 10 MG PO TABS
10.0000 mg | ORAL_TABLET | Freq: Two times a day (BID) | ORAL | Status: DC
  Administered 2023-03-24: 10 mg via ORAL
  Filled 2023-03-23: qty 1

## 2023-03-23 MED ORDER — SODIUM CHLORIDE 0.9% FLUSH
3.0000 mL | Freq: Two times a day (BID) | INTRAVENOUS | Status: DC
  Administered 2023-03-23 – 2023-03-24 (×4): 3 mL via INTRAVENOUS

## 2023-03-23 NOTE — Evaluation (Signed)
 Occupational Therapy Evaluation Patient Details Name: Marilyn Cook MRN: [id number] DOB: Mar 07, 1945 Today's Date: 03/23/2023   History of Present Illness   Marilyn Cook is a 78 y.o. female admitted 03/22/23 due to episode of syncope and collapse with head strike s/p event patient has episode of emesis. Patient did not recall event. 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 Dx with UTI. PMH includes HTN, hyperlipidemia, TIA 2017 and a diagnosis of dementia due to Alzheimer's disease, hypothyroidism, GAD, HLD     Clinical Impressions Pt is typically independent in mobility and does her own ADL. Her son manages her medicines/finances and drives her places. Pt also has an aide that comes in 1x/wk. For extra safety, Pt's son has cameras on the inside and outside of Pt's home. Son does cooking and Pt "reheats" for meals they do not eat together. Son is frequently over at his mom's house. Today Pt was very pleasant nd joyful. See orthostatic vitals below. Despite and initial drop with standing, with activity BP returned to [facility]. Pt able to demonstrate UB and LB ADL, pleasantly confused (assume baseline after conversation with son). At this time recommending HHOT post-acute to maximize safety and independence in ADL and functional transfers as well as perform a  safety and falls risk assessment for the home. Next session acutely plan on doing further visual assessment to ensure vision at baseline.       03/23/23 1130 After 3 min of activity  Orthostatic Lying   BP- Lying   130/64  --   Pulse- Lying   85  --   Orthostatic Sitting  BP- Sitting   144/82  --   Pulse- Sitting   90  --   Orthostatic Standing at 0 minutes  BP- Standing at 0 minutes   118/58  --   Pulse- Standing at 0 minutes   102  --   Orthostatic Standing at 3 minutes  BP- Standing at 3 minutes   (!) 110/94 138/67  Pulse- Standing at 3 minutes   98 92           If plan is discharge home, recommend the following:   Assistance with cooking/housework;Supervision due to cognitive status;Assist for transportation     Functional Status Assessment   Patient has had a recent decline in their functional status and demonstrates the ability to make significant improvements in function in a reasonable and predictable amount of time.     Equipment Recommendations   None recommended by OT     Recommendations for Other Services   PT consult     Precautions/Restrictions   Precautions Precautions: Fall Precaution/Restrictions Comments: Alz dementia Restrictions Weight Bearing Restrictions Per Provider Order: No     Mobility Bed Mobility Overal bed mobility: Needs Assistance Bed Mobility: Supine to Sit, Sit to Supine     Supine to sit: Min assist (suspect due to "bump" in ED stretcher") Sit to supine: Supervision        Transfers Overall transfer level: Needs assistance Equipment used: None Transfers: Sit to/from Stand, Bed to chair/wheelchair/BSC Sit to Stand: Contact guard assist     Step pivot transfers: Contact guard assist     General transfer comment: good power up, moves quickly, no overt LOB GCA for safety and line management      Balance Overall balance assessment: History of Falls  ADL either performed or assessed with clinical judgement   ADL Overall ADL's : Needs assistance/impaired Eating/Feeding: Modified independent   Grooming: Contact guard assist;Standing   Upper Body Bathing: Contact guard assist;Standing   Lower Body Bathing: Contact guard assist;Sit to/from stand   Upper Body Dressing : Set up;Sitting   Lower Body Dressing: Contact guard assist;Sitting/lateral leans Lower Body Dressing Details (indicate cue type and reason): donning socks on Bil feet Toilet Transfer: Contact guard assist;Ambulation   Toileting- Clothing  Manipulation and Hygiene: Contact guard assist;Sitting/lateral lean       Functional mobility during ADLs: Contact guard assist       Vision Ability to See in Adequate Light: 0 Adequate Patient Visual Report: No change from baseline Vision Assessment?: No apparent visual deficits Additional Comments: will continue to assess     Perception         Praxis         Pertinent Vitals/Pain Pain Assessment Pain Assessment: No/denies pain     Extremity/Trunk Assessment Upper Extremity Assessment Upper Extremity Assessment: Overall WFL for tasks assessed   Lower Extremity Assessment Lower Extremity Assessment: Defer to PT evaluation   Cervical / Trunk Assessment Cervical / Trunk Assessment: Normal   Communication Communication Communication: No apparent difficulties   Cognition Arousal: Alert Behavior During Therapy: WFL for tasks assessed/performed Cognition: History of cognitive impairments             OT - Cognition Comments: Pt pleasant and cooperative throughout session. loves to laugh. Son says this is baseline "She never met a stranger" Pt also has grequent hallucinations , which son states are normal (she told therapy about the lady that stands at the top of her trees).                 Following commands: Intact       Cueing  General Comments   Cueing Techniques: Verbal cues;Gestural cues  called and spoke with son "[name]" to confirm home set up and PLOF   Exercises     Shoulder Instructions      Home Living Family/patient expects to be discharged to:: Private residence Living Arrangements: Alone (son lives next door, has indoor and outdoor camera) Available Help at Discharge: Family;Available 24 hours/day;Personal care attendant (PCA 1x/wk, son looking into more) Type of Home: House Home Access: Level entry     Home Layout: Multi-level;Laundry or work area in basement;Able to live on main level with bedroom/bathroom     Bathroom  Shower/Tub: Producer, television/film/video:  (comfort height)     Home Equipment: Shower seat (but does not use)   Additional Comments: son lives next door, he has cameras in the inside and the outside of the home.      Prior Functioning/Environment Prior Level of Function : Needs assist (confirmed through son)  Cognitive Assist : ADLs (cognitive)   ADLs (Cognitive): Intermittent cues       Mobility Comments: does not use DME in the home or the community ADLs Comments: does her own bathing/dressing, does not like the shower chair - son manages medicines/finances and does most of the cooking (she will re-heat things) son assists with cleaning and yardwork too    OT Problem List: Impaired balance (sitting and/or standing);Decreased safety awareness   OT Treatment/Interventions: Self-care/ADL training;DME and/or AE instruction;Therapeutic activities;Patient/family education;Balance training      OT Goals(Current goals can be found in the care plan section)   Acute Rehab OT Goals Patient Stated Goal: to  get back home after all my tests OT Goal Formulation: With patient Time For Goal Achievement: 04/06/23 Potential to Achieve Goals: Good ADL Goals Pt Will Perform Grooming: with modified independence;standing Pt Will Perform Upper Body Dressing: with modified independence;sitting Pt Will Perform Lower Body Dressing: with modified independence;sit to/from stand Pt Will Transfer to Toilet: with supervision;ambulating Pt Will Perform Toileting - Clothing Manipulation and hygiene: with modified independence;sitting/lateral leans   OT Frequency:  Min 2X/week    Co-evaluation PT/OT/SLP Co-Evaluation/Treatment: Yes Reason for Co-Treatment: To address functional/ADL transfers;Necessary to address cognition/behavior during functional activity PT goals addressed during session: Mobility/safety with mobility;Balance OT goals addressed during session: ADL's and  self-care;Strengthening/ROM      AM-PAC OT "6 Clicks" Daily Activity     Outcome Measure Help from another person eating meals?: None Help from another person taking care of personal grooming?: A Little Help from another person toileting, which includes using toliet, bedpan, or urinal?: A Little Help from another person bathing (including washing, rinsing, drying)?: A Little Help from another person to put on and taking off regular upper body clothing?: None Help from another person to put on and taking off regular lower body clothing?: A Little 6 Click Score: 20   End of Session Equipment Utilized During Treatment: Gait belt Nurse Communication: Mobility status  Activity Tolerance: Patient tolerated treatment well Patient left: in bed;with call bell/phone within reach  OT Visit Diagnosis: History of falling (Z91.81);Unsteadiness on feet (R26.81);Other symptoms and signs involving cognitive function                Time: 1036-1101 OT Time Calculation (min): 25 min Charges:  OT General Charges $OT Visit: 1 Visit OT Evaluation $OT Eval Low Complexity: 1 Low  Nyoka Cowden OTR/L Acute Rehabilitation Services Office: [phone]  Evern Bio [facility] 03/23/2023, 12:06 PM

## 2023-03-23 NOTE — Progress Notes (Addendum)
 I have seen and assessed patient and agree with Dr. Maurine Minister assessment and plan.  Patient 78 year old female history of hypertension, hyperlipidemia, TIA 2017, diagnosis of dementia due to Alzheimer's disease, hypothyroidism, GAD, hyperlipidemia presented to ED with presyncope versus syncopal episode.  Per son patient standing up waiting for her turn to play pool with her son and grandson, when she suddenly started to slump over and subsequently fell.  Son stated immediately patient had some emesis and was able to state her name, who he was and responded and following some commands.  Unsure as to whether patient was presyncope versus syncopal episode.  Per son EMS got there and noted CBGs to be stable.    Physical exam  General: NAD.   Respiratory: Lungs clear to auscultation bilaterally.  No wheezes, no crackles, no rhonchi.  Fair air movement.  Speaking in full sentences.   Cardiovascular: Regular rate rhythm no murmurs rubs or gallops.  No JVD.  No lower extremity edema.   GI: Abdomen is soft, nontender, nondistended, positive bowel sounds.  No rebound.  No guarding.   Extremities: No clubbing cyanosis or edema.    Assessment/plan  #1 syncope versus presyncope  -Likely secondary to orthostasis. -CT head CT C-spine done negative for any acute abnormalities.  Chest x-ray plan films of the pelvis unremarkable.   -MRI brain with small volume subarachnoid signal abnormality along the right occipital lobe, favor subacute to chronic blood products given signal gradient.  No acute hemorrhage by prior head CT.  No acute infarct.  Generalized atrophy with chronic small vessel ischemia.   -2D echo ordered and pending.  -Orthostatics obtained this morning positive. -IV fluids. -Urinalysis concerning for UTI.  Urine cultures obtained and pending.  Continue empiric IV antibiotics, IV fluids, supportive care.  If syncope workup negative and patient improved and back to baseline may consider discharge with  event monitor with close outpatient follow-up with PCP.   2.  UTI -Urine cultures pending. -IV Rocephin.  3.  Orthostasis -Orthostatics noted to be positive. -Normal saline 1L bolus x 1. -Normal saline 100 cc an hour. -Repeat orthostatics in the AM.  4.  Hypertension -Patient noted to be orthostatic. -Continue to hold antihypertensive medications.  5.  Hyperlipidemia -Continue statin.  6.  Hypothyroidism -Continue home regimen Synthroid.  7.  Dementia due to Alzheimer's disease -Stable. -Continue home regimen rivastigmine, Namenda, Zoloft, Depakote  No charge.

## 2023-03-23 NOTE — H&P (Signed)
 History and Physical    Marilyn Cook:[id number] DOB: 12-18-1945 DOA: 03/22/2023  PCP: Eustaquio Boyden, MD  Patient coming from: home  I have personally briefly reviewed patient's old medical records in [facility]  Chief Complaint: syncope with fall and head trauma  HPI: Marilyn Cook is a 78 y.o. female with medical history significant of  of hypertension, hyperlipidemia, TIA 2017 and a diagnosis of dementia due to Alzheimer's disease ,hypothyroidism,GAD,HLD who presents to ED s/p episode of syncope and collapse with head strike.  Patient currently does not recall event, and noted feeling at her baseline prior to episode. Per son who aides in history s/p event patient has episode of emesis. Patient currently notes she feels back to her baseline and has no complaints.  ED Course:  IN ed patient was evaluated note to have stable vital signs and trauma imaging series that was unremarkable, patient also note to have UTI due to history patient is admitted for further evaluation of syncope.   Vitals: Afeb, bp 124/82, hr 71, rr 18, sat 98%  UA: Large LE , wbc 21-50, rare bacteria  Cxr:NAD Pelvis xray: NAD Labs  Wbc 4.3, hgb 14.7, plt 218 Na 141, K 3.7, CL 105, cr 0.65 Mag 2.2 CE<2,3 CTH NAD EKG: nsr at 73  Review of Systems: As per HPI otherwise 10 point review of systems negative.   Past Medical History:  Diagnosis Date   Abdominal discomfort 10/08/2021   Abnormal urinalysis 02/24/2020   Acute cystitis 10/08/2021   Atherosclerosis of aorta 03/18/2020   By CT 03/2020 and MRI 04/2020   Benign tumor of breast 01/06/1973   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   ?hypothyroid - start levothyroxine daily   Cerebrovascular disease    Moderate per MRI   Chronic right shoulder pain 10/14/2014   Complication of anesthesia    hard to wake up   Compression fracture of thoracic vertebra 02/25/2020   Incidental finding on CXR 02/2020 - T10 compression fracture     Diverticulosis of colon 10/27/2007   Generalized anxiety disorder 09/25/2021   Hiatal hernia 03/18/2020   Moderate sized with evidence esoph dysmotility or GERD on CT 03/2020   HLD (hyperlipidemia) 06/30/2006   New goal LDL <70 (1//2017)   Hyperlipemia    Lesion of right native kidney 03/18/2020   On CT 03/2020  MRI abd 04/2020 - R exophytic cyst - benign complex Bosniak 2 cyst - no f/u needed    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   DEXA 03/2020 - spine -1.0, R hip neck -1.9 - however dx osteoporosis given h/o vertebral compression fracture on previous CXR, rec rpt 1 yr   Continue cal/vit D daily   First prolia injection 06/25/2020  Latest prolia injection 08/01/2021   Palpitations 03/28/2013   Pulsatile tinnitus of right ear 05/21/2021   Renal insufficiency 10/18/2019   Right-sided low back pain without sciatica 07/13/2021   Scalp psoriasis    TIA (transient ischemic attack) 06/08/2014   Unintentional weight loss  04/13/2014   Vitamin B12 deficiency 08/05/2021    Past Surgical History:  Procedure Laterality Date   ABDOMINAL HYSTERECTOMY     CATARACT EXTRACTION  11/2021   Beavis   COLONOSCOPY  04/07/2015   diverticulosis, int hem, rpt 10 yrs (Nandigam)   DEXA  08/06/2012   spine WNL, hip T -1.2   PARTIAL HYSTERECTOMY  01/06/1985   due to fibroids, ovaries remain   R Lat Epicondyle Injections  01/07/1996   SHOULDER ARTHROSCOPY Right 11/07/2014   arthroscopic debridement and mini open rotator cuff repair of R shoulder for  chronic impingement and rotator cuff tear by MRI Ophelia Charter)   Spiral CT  04/06/2000   (-)     reports that she has never smoked. She has never used smokeless tobacco. She reports that she does not drink alcohol and does not use drugs.  Allergies  Allergen Reactions   Aspirin     Unknown reaction   Diphenhydramine Hcl    Donepezil Other (See Comments)    GI upset   Egg-Derived Products Hives and Swelling   Epinephrine     REACTION: UNSPECIFIED   Latex     REACTION: swelling   Other Other (See Comments)    GI upset, headache to DAIRY PRODUCTS   Penicillins    Sulfasalazine     Family History  Problem Relation Age of Onset   Stroke Mother        CVA   Heart disease Father        MI    Stroke Father        CVA   Hypertension Sister    Cancer Sister        ovarian and colon cancer   Cancer Maternal Aunt        breast   Dementia Maternal Aunt    Cancer Paternal Aunt        ? colon   Cancer Paternal Grandfather        liver, alcohol   Alcohol abuse Paternal Grandfather    Aneurysm Brother    Hypertension Brother    Hypertension Brother    Cancer Other        prostate   Breast cancer Neg Hx     Prior to Admission medications   Medication Sig Start Date End Date Taking? Authorizing Provider  atorvastatin (LIPITOR) 40 MG tablet Take 1 tablet (40 mg total) by mouth daily. 08/18/22  Yes Eustaquio Boyden, MD  clopidogrel (PLAVIX) 75 MG tablet Take 1 tablet (75 mg total) by mouth daily. 08/18/22  Yes Eustaquio Boyden, MD  cyanocobalamin (VITAMIN B12) 500 MCG tablet Take 1 tablet (500 mcg total) by mouth daily. 09/25/21  Yes Eustaquio Boyden, MD  denosumab (PROLIA) 60 MG/ML SOSY injection Inject 60 mg into the skin every 6 (six) months. 10/15/22  Yes Eustaquio Boyden, MD  divalproex (DEPAKOTE) 125 MG DR tablet TAKE 1 TABLET (125 MG TOTAL) BY MOUTH AT BEDTIME Patient taking differently: Take 125 mg by mouth daily. 11/10/22  Yes Marcos Eke, PA-C  levothyroxine (SYNTHROID)  25 MCG tablet Take 1 tablet (25 mcg total) by mouth daily before breakfast. 08/18/22  Yes Eustaquio Boyden, MD  loratadine (CLARITIN) 10 MG tablet Take 10 mg by mouth daily.   Yes [provider]  Magnesium 250 MG CAPS Take 1 capsule by mouth at bedtime. 02/23/23  Yes Eustaquio Boyden, MD  memantine (NAMENDA) 10 MG tablet TAKE 1 TABLET BY MOUTH TWICE A DAY 09/05/22  Yes Marcos Eke,  PA-C  Pyridoxine HCl (VITAMIN B-6 PO) Take 1 tablet by mouth daily.   Yes [provider]  rivastigmine (EXELON) 1.5 MG capsule Take 1 cap at night for 2 weeks, then increase to 1 cap twice daily 03/09/23  Yes Wertman, Sung Amabile, PA-C  sertraline (ZOLOFT) 25 MG tablet Take 1 tablet (25 mg total) by mouth daily. 08/18/22  Yes Eustaquio Boyden, MD  co-enzyme Q-10 30 MG capsule Take 30 mg by mouth daily. Patient not taking: Reported on 03/22/2023    [provider]  Potassium 99 MG TABS Take 1 tablet (99 mg total) by mouth daily. Patient not taking: Reported on 03/22/2023 09/25/21   Eustaquio Boyden, MD  triamcinolone (KENALOG) 0.147 MG/GM topical spray Apply topically 2 (two) times daily as needed (psoriasis rash on scalp). Patient not taking: Reported on 03/22/2023 08/18/22   Eustaquio Boyden, MD    Physical Exam: Vitals:   03/22/23 2039 03/22/23 2115 03/22/23 2130 03/22/23 2200  BP:  (!) 166/88 (!) 147/83 (!) 140/72  Pulse:  79 75 78  Resp:   15 16  Temp:      TempSrc:      SpO2: 98% 100% 100% 100%  Weight:      Height:        Constitutional: NAD, calm, comfortable Vitals:   03/22/23 2039 03/22/23 2115 03/22/23 2130 03/22/23 2200  BP:  (!) 166/88 (!) 147/83 (!) 140/72  Pulse:  79 75 78  Resp:   15 16  Temp:      TempSrc:      SpO2: 98% 100% 100% 100%  Weight:      Height:       Eyes: PERRL, lids and conjunctivae normal ENMT: Mucous membranes are moist. Posterior pharynx clear of any exudate or lesions.Normal dentition.  Neck: normal, supple, no masses, no  thyromegaly Respiratory: clear to auscultation bilaterally, no wheezing, no crackles. Normal respiratory effort. No accessory muscle use.  Cardiovascular: Regular rate and rhythm, no murmurs / rubs / gallops. No extremity edema. 2+ pedal pulses.  Abdomen: no tenderness, no masses palpated. No hepatosplenomegaly. Bowel sounds positive.  Musculoskeletal: no clubbing / cyanosis. No joint deformity upper and lower extremities. Good ROM, no contractures. Normal muscle tone.  Skin: no rashes, lesions, ulcers. No induration Neurologic: CN 2-12 grossly intact. Sensation intact,  Strength 5/5 in all 4.  Psychiatric: Normal judgment and insight. Alert and oriented x 3. Normal mood.    Labs on Admission: I have personally reviewed following labs and imaging studies  CBC: Recent Labs  Lab 03/22/23 2000  WBC 4.3  NEUTROABS 2.3  HGB 14.7  HCT 43.7  MCV 97.8  PLT 218   Basic Metabolic Panel: Recent Labs  Lab 03/22/23 2000  NA 141  K 3.7  CL 105  CO2 26  GLUCOSE 96  BUN 5*  CREATININE 0.65  CALCIUM 7.9*  MG 2.2   GFR: Estimated Creatinine Clearance: 53.1 mL/min (by C-G formula based on SCr of 0.65 mg/dL). Liver Function Tests: Recent Labs  Lab 03/22/23 2000  AST 31  ALT 21  ALKPHOS 71  BILITOT 0.6  PROT 6.8  ALBUMIN 3.6   No results for input(s): "LIPASE", "AMYLASE" in the last 168 hours. No results for input(s): "AMMONIA" in the last 168 hours. Coagulation Profile: Recent Labs  Lab 03/22/23 2000  INR 1.3*   Cardiac Enzymes: No results for input(s): "CKTOTAL", "CKMB", "CKMBINDEX", "TROPONINI" in the last 168 hours. BNP (last 3 results) No results for input(s): "PROBNP" in  the last 8760 hours. HbA1C: No results for input(s): "HGBA1C" in the last 72 hours. CBG: No results for input(s): "GLUCAP" in the last 168 hours. Lipid Profile: No results for input(s): "CHOL", "HDL", "LDLCALC", "TRIG", "CHOLHDL", "LDLDIRECT" in the last 72 hours. Thyroid Function Tests: No  results for input(s): "TSH", "T4TOTAL", "FREET4", "T3FREE", "THYROIDAB" in the last 72 hours. Anemia Panel: No results for input(s): "VITAMINB12", "FOLATE", "FERRITIN", "TIBC", "IRON", "RETICCTPCT" in the last 72 hours. Urine analysis:    Component Value Date/Time   COLORURINE YELLOW 03/22/2023 1920   APPEARANCEUR CLEAR 03/22/2023 1920   LABSPEC 1.005 03/22/2023 1920   PHURINE 7.0 03/22/2023 1920   GLUCOSEU NEGATIVE 03/22/2023 1920   HGBUR SMALL (A) 03/22/2023 1920   BILIRUBINUR NEGATIVE 03/22/2023 1920   BILIRUBINUR negative 10/15/2021 1130   KETONESUR NEGATIVE 03/22/2023 1920   PROTEINUR NEGATIVE 03/22/2023 1920   UROBILINOGEN 0.2 10/15/2021 1130   NITRITE NEGATIVE 03/22/2023 1920   LEUKOCYTESUR LARGE (A) 03/22/2023 1920    Radiological Exams on Admission: CT Head Wo Contrast Result Date: 03/22/2023 CLINICAL DATA:  Neck trauma, fall, on blood thinners, level 2 trauma, head trauma EXAM: CT HEAD WITHOUT CONTRAST CT CERVICAL SPINE WITHOUT CONTRAST TECHNIQUE: Multidetector CT imaging of the head and cervical spine was performed following the standard protocol without intravenous contrast. Multiplanar CT image reconstructions of the cervical spine were also generated. RADIATION DOSE REDUCTION: This exam was performed according to the departmental dose-optimization program which includes automated exposure control, adjustment of the mA and/or kV according to patient size and/or use of iterative reconstruction technique. COMPARISON:  CT head 02/13/2022 FINDINGS: CT HEAD FINDINGS Brain: No intracranial hemorrhage, mass effect, or evidence of acute infarct. No hydrocephalus. No extra-axial fluid collection. Age related cerebral atrophy and chronic small vessel ischemic disease. Vascular: No hyperdense vessel. Intracranial arterial calcification. Skull: No fracture or focal lesion. Sinuses/Orbits: Frothy mucous in the left sphenoid sinus. The paranasal sinuses and mastoid air cells are otherwise well  aerated. Other: None. CT CERVICAL SPINE FINDINGS Alignment: No evidence of traumatic malalignment. Skull base and vertebrae: No acute fracture. No primary bone lesion or focal pathologic process. Soft tissues and spinal canal: No prevertebral fluid or swelling. No visible canal hematoma. Disc levels: Multilevel spondylosis, disc space height loss, and degenerative endplate changes greatest at C4-C5 where it is moderate to advanced. Multilevel facet arthropathy with ankylosis of the right C2-C3 and C4-C5 facets. No severe spinal canal narrowing. Upper chest: No acute abnormality. Other: Carotid calcification. IMPRESSION: 1. No acute intracranial abnormality. 2. No acute fracture in the cervical spine. Electronically Signed   By: Minerva Fester M.D.   On: 03/22/2023 20:08   CT Cervical Spine Wo Contrast Result Date: 03/22/2023 CLINICAL DATA:  Neck trauma, fall, on blood thinners, level 2 trauma, head trauma EXAM: CT HEAD WITHOUT CONTRAST CT CERVICAL SPINE WITHOUT CONTRAST TECHNIQUE: Multidetector CT imaging of the head and cervical spine was performed following the standard protocol without intravenous contrast. Multiplanar CT image reconstructions of the cervical spine were also generated. RADIATION DOSE REDUCTION: This exam was performed according to the departmental dose-optimization program which includes automated exposure control, adjustment of the mA and/or kV according to patient size and/or use of iterative reconstruction technique. COMPARISON:  CT head 02/13/2022 FINDINGS: CT HEAD FINDINGS Brain: No intracranial hemorrhage, mass effect, or evidence of acute infarct. No hydrocephalus. No extra-axial fluid collection. Age related cerebral atrophy and chronic small vessel ischemic disease. Vascular: No hyperdense vessel. Intracranial arterial calcification. Skull: No fracture or focal lesion. Sinuses/Orbits:  Frothy mucous in the left sphenoid sinus. The paranasal sinuses and mastoid air cells are otherwise  well aerated. Other: None. CT CERVICAL SPINE FINDINGS Alignment: No evidence of traumatic malalignment. Skull base and vertebrae: No acute fracture. No primary bone lesion or focal pathologic process. Soft tissues and spinal canal: No prevertebral fluid or swelling. No visible canal hematoma. Disc levels: Multilevel spondylosis, disc space height loss, and degenerative endplate changes greatest at C4-C5 where it is moderate to advanced. Multilevel facet arthropathy with ankylosis of the right C2-C3 and C4-C5 facets. No severe spinal canal narrowing. Upper chest: No acute abnormality. Other: Carotid calcification. IMPRESSION: 1. No acute intracranial abnormality. 2. No acute fracture in the cervical spine. Electronically Signed   By: Minerva Fester M.D.   On: 03/22/2023 20:08   DG Pelvis Portable Result Date: 03/22/2023 CLINICAL DATA:  Status post fall. EXAM: PORTABLE PELVIS 1-2 VIEWS COMPARISON:  July 02, 2016 FINDINGS: There is no evidence of pelvic fracture or diastasis. No pelvic bone lesions are seen. IMPRESSION: Negative. Electronically Signed   By: Aram Candela M.D.   On: 03/22/2023 19:31   DG Chest Portable 1 View Result Date: 03/22/2023 CLINICAL DATA:  Status post fall. EXAM: PORTABLE CHEST 1 VIEW COMPARISON:  February 24, 2020 FINDINGS: The heart size and mediastinal contours are within normal limits. Both lungs are clear. Postoperative changes are seen involving the right shoulder. Multilevel degenerative changes are noted throughout the thoracic spine. IMPRESSION: No active cardiopulmonary disease. Electronically Signed   By: Aram Candela M.D.   On: 03/22/2023 19:30    EKG: Independently reviewed.   Assessment/Plan  Syncope -hx of TIA 2017  - current episode no prodrome  - neuro current nonfocal  -CTH negative -orthostatic pending  - current possible cause related to infection /uti  -however cannot r/o other neuro/cardiac cause MRI , echo  neuro check ordered -gently  hydration overnight    Hypertension -hold anti-hypertensive overnight  -resume in am if orthostatic negative    Hyperlipidemia -continue statin    Dementia due to Alzheimer's disease -followed by neurology   Hypothyroidism -resume synthroid     DVT prophylaxis: heparin Code Status: full/ as discussed per patient wishes in event of cardiac arrest  Family Communication: none at bedside Disposition Plan: patient  expected to be admitted less than 2 midnights  Consults called: n/a Admission status: cardiac tele  Lurline Del MD [facility]   If 7PM-7AM, please contact night-coverage [email or web address] Password [facility]  03/23/2023, 12:07 AM

## 2023-03-23 NOTE — Evaluation (Signed)
 Physical Therapy Evaluation Patient Details Name: Marilyn Cook MRN: [id number] DOB: May 22, 1945 Today's Date: 03/23/2023  History of Present Illness  Marilyn Cook is a 78 y.o. female admitted 03/22/23 due to episode of syncope and collapse with head strike s/p event patient has episode of emesis. Patient did not recall event. 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 Dx with UTI. PMH includes HTN, hyperlipidemia, TIA 2017 and a diagnosis of dementia due to Alzheimer's disease, hypothyroidism, GAD, HLD  Clinical Impression  Pt admitted with above diagnosis. Pt at times confused during evaluation.  Pt was able to ambulate with overall steady gait without device.  Scored 19/24 on DGI suggesting slight risk of falls without device. Called son who confirmed that pt is close baseline.  Will recommend HHPT for safety in the home.  Do not feel that a device is needed at present as unsure if pt would use one with her confusion.  Son aware of pts deficits. Pt currently with functional limitations due to the deficits listed below (see PT Problem List). Pt will benefit from acute skilled PT to increase their independence and safety with mobility to allow discharge.           If plan is discharge home, recommend the following: A little help with walking and/or transfers;Assistance with cooking/housework;Assist for transportation;Help with stairs or ramp for entrance;Supervision due to cognitive status   Can travel by private vehicle        Equipment Recommendations None recommended by PT  Recommendations for Other Services       Functional Status Assessment Patient has had a recent decline in their functional status and demonstrates the ability to make significant improvements in function in a reasonable and predictable amount of time.     Precautions / Restrictions Precautions Precautions: Fall Precaution/Restrictions  Comments: Alz dementia Restrictions Weight Bearing Restrictions Per Provider Order: No      Mobility  Bed Mobility Overal bed mobility: Needs Assistance Bed Mobility: Supine to Sit, Sit to Supine     Supine to sit: Min assist (suspect due to "bump" in ED stretcher") Sit to supine: Supervision        Transfers Overall transfer level: Needs assistance Equipment used: None Transfers: Sit to/from Stand, Bed to chair/wheelchair/BSC Sit to Stand: Contact guard assist   Step pivot transfers: Contact guard assist       General transfer comment: good power up, moves quickly, no overt LOB but CGA for safety and line management    Ambulation/Gait Ambulation/Gait assistance: Contact guard assist Gait Distance (Feet): 350 Feet Assistive device: None Gait Pattern/deviations: Step-through pattern, Decreased stride length, Drifts right/left   Gait velocity interpretation: 1.31 - 2.62 ft/sec, indicative of limited community ambulator   General Gait Details: Pt able to ambulate without device without significant LOB.  Pt was given challenges to balance and is able to withstand challenges with some incr sway but pt did self correct balance.  Stairs            Wheelchair Mobility     Tilt Bed    Modified Rankin (Stroke Patients Only)       Balance Overall balance assessment: History of Falls                               Standardized Balance Assessment Standardized Balance Assessment : Dynamic Gait Index   Dynamic Gait Index  Level Surface: Normal Change in Gait Speed: Normal Gait with Horizontal Head Turns: Mild Impairment Gait with Vertical Head Turns: Mild Impairment Gait and Pivot Turn: Normal Step Over Obstacle: Mild Impairment Step Around Obstacles: Normal Steps: Moderate Impairment Total Score: 19       Pertinent Vitals/Pain Pain Assessment Pain Assessment: No/denies pain    Home Living Family/patient expects to be discharged to::  Private residence Living Arrangements: Alone (son lives next door, has indoor and outdoor camera) Available Help at Discharge: Family;Available 24 hours/day;Personal care attendant (PCA 1x/wk, son looking into more) Type of Home: House Home Access: Level entry       Home Layout: Multi-level;Laundry or work area in basement;Able to live on main level with [employer]: Shower seat (but does not use) Additional Comments: son lives next door, he has cameras in the inside and the outside of the home.    Prior Function Prior Level of Function : Needs assist (confirmed through son)  Cognitive Assist : ADLs (cognitive)   ADLs (Cognitive): Intermittent cues       Mobility Comments: does not use DME in the home or the community ADLs Comments: does her own bathing/dressing, does not like the shower chair - son manages medicines/finances and does most of the cooking (she will re-heat things) son assists with cleaning and yardwork too     Extremity/Trunk Assessment   Upper Extremity Assessment Upper Extremity Assessment: Defer to OT evaluation    Lower Extremity Assessment Lower Extremity Assessment: Generalized weakness    Cervical / Trunk Assessment Cervical / Trunk Assessment: Normal  Communication   Communication Communication: No apparent difficulties    Cognition Arousal: Alert Behavior During Therapy: WFL for tasks assessed/performed                             Following commands: Intact       Cueing Cueing Techniques: Verbal cues, Gestural cues     General Comments General comments (skin integrity, edema, etc.): OT called and discussed pts home situation with son.  See Orthostatic VS in VS flowsheet.    Exercises     Assessment/Plan    PT Assessment Patient needs continued PT services  PT Problem List Decreased activity tolerance;Decreased balance;Decreased mobility;Decreased knowledge of use of DME;Decreased safety  awareness;Decreased knowledge of precautions;Cardiopulmonary status limiting activity       PT Treatment Interventions DME instruction;Gait training;Functional mobility training;Therapeutic activities;Therapeutic exercise;Balance training;Patient/family education    PT Goals (Current goals can be found in the Care Plan section)  Acute Rehab PT Goals Patient Stated Goal: to go home PT Goal Formulation: With patient Time For Goal Achievement: 04/06/23 Potential to Achieve Goals: Good    Frequency Min 2X/week     Co-evaluation PT/OT/SLP Co-Evaluation/Treatment: Yes Reason for Co-Treatment: To address functional/ADL transfers;Necessary to address cognition/behavior during functional activity PT goals addressed during session: Mobility/safety with mobility;Balance OT goals addressed during session: ADL's and self-care;Strengthening/ROM       AM-PAC PT "6 Clicks" Mobility  Outcome Measure Help needed turning from your back to your side while in a flat bed without using bedrails?: None Help needed moving from lying on your back to sitting on the side of a flat bed without using bedrails?: A Little Help needed moving to and from a bed to a chair (including a wheelchair)?: A Little Help needed standing up from a chair using your arms (e.g., wheelchair or bedside chair)?: A Little Help needed to walk  in hospital room?: A Little Help needed climbing 3-5 steps with a railing? : A Lot 6 Click Score: 18    End of Session Equipment Utilized During Treatment: Gait belt Activity Tolerance: Patient tolerated treatment well Patient left: with call bell/phone within reach;with chair alarm set (on stretcher) Nurse Communication: Mobility status PT Visit Diagnosis: Muscle weakness (generalized) (M62.81)    Time: 1040-1100 PT Time Calculation (min) (ACUTE ONLY): 20 min   Charges:   PT Evaluation $PT Eval Low Complexity: 1 Low   PT General Charges $$ ACUTE PT VISIT: 1 Visit          Kyiesha Millward M,PT Acute Rehab Services [phone]   Bevelyn Buckles 03/23/2023, 1:16 PM

## 2023-03-23 NOTE — ED Notes (Signed)
 Sit patient up in the bed patient is up eating breakfast patient has family at bedside and call bell in reach

## 2023-03-23 NOTE — Progress Notes (Signed)

## 2023-03-23 NOTE — ED Notes (Signed)
 Pt d/c her own IV before lunch arrived. Pt stated "someone else came in and must've done it." Primary RN verified IV placement 5 minutes before IV was d/c

## 2023-03-24 DIAGNOSIS — F03918 Unspecified dementia, unspecified severity, with other behavioral disturbance: Secondary | ICD-10-CM | POA: Diagnosis not present

## 2023-03-24 DIAGNOSIS — W1830XA Fall on same level, unspecified, initial encounter: Secondary | ICD-10-CM | POA: Diagnosis not present

## 2023-03-24 DIAGNOSIS — F411 Generalized anxiety disorder: Secondary | ICD-10-CM

## 2023-03-24 DIAGNOSIS — F0280 Dementia in other diseases classified elsewhere without behavioral disturbance: Secondary | ICD-10-CM

## 2023-03-24 DIAGNOSIS — R55 Syncope and collapse: Secondary | ICD-10-CM | POA: Diagnosis not present

## 2023-03-24 LAB — BASIC METABOLIC PANEL
Anion gap: 8 (ref 5–15)
BUN: 10 mg/dL (ref 8–23)
CO2: 23 mmol/L (ref 22–32)
Calcium: 7 mg/dL — ABNORMAL LOW (ref 8.9–10.3)
Chloride: 112 mmol/L — ABNORMAL HIGH (ref 98–111)
Creatinine, Ser: 0.67 mg/dL (ref 0.44–1.00)
GFR, Estimated: >60 mL/min (ref >60)
Glucose, Bld: 100 mg/dL — ABNORMAL HIGH (ref 70–99)
Potassium: 3.6 mmol/L (ref 3.5–5.1)
Sodium: 143 mmol/L (ref 135–145)

## 2023-03-24 LAB — CBC
HCT: 37.4 % (ref 36.0–46.0)
Hemoglobin: 12.4 g/dL (ref 12.0–15.0)
MCH: 32.6 pg (ref 26.0–34.0)
MCHC: 33.2 g/dL (ref 30.0–36.0)
MCV: 98.4 fL (ref 80.0–100.0)
Platelets: 182 10*3/uL (ref 150–400)
RBC: 3.8 MIL/uL — ABNORMAL LOW (ref 3.87–5.11)
RDW: 12.7 % (ref 11.5–15.5)
WBC: 5 10*3/uL (ref 4.0–10.5)
nRBC: 0 % (ref 0.0–0.2)

## 2023-03-24 LAB — GLUCOSE, CAPILLARY
Glucose-Capillary: 104 mg/dL — ABNORMAL HIGH (ref 70–99)
Glucose-Capillary: 93 mg/dL (ref 70–99)

## 2023-03-24 LAB — MAGNESIUM: Magnesium: 2.1 mg/dL (ref 1.7–2.4)

## 2023-03-24 MED ORDER — CEPHALEXIN 500 MG PO CAPS: 500.0000 mg | ORAL_CAPSULE | Freq: Three times a day (TID) | ORAL | 0 refills | Status: AC

## 2023-03-24 NOTE — Plan of Care (Signed)
  Problem: Clinical Measurements: Goal: Ability to maintain clinical measurements within normal limits will improve Outcome: Progressing   Problem: Activity: Goal: Risk for activity intolerance will decrease Outcome: Progressing   Problem: Safety: Goal: Ability to remain free from injury will improve Outcome: Progressing   

## 2023-03-24 NOTE — Progress Notes (Signed)
   03/24/23 0940  Vitals  Pulse Rate 84  ECG Heart Rate 85  Resp 18  MEWS COLOR  MEWS Score Color Green  Orthostatic Lying   BP- Lying 131/67  Pulse- Lying 77  Orthostatic Sitting  BP- Sitting 126/71  Pulse- Sitting 79  Orthostatic Standing at 0 minutes  BP- Standing at 0 minutes 118/68  Pulse- Standing at 0 minutes 80  Orthostatic Standing at 3 minutes  BP- Standing at 3 minutes 112/71  Pulse- Standing at 3 minutes 84  Oxygen Therapy  SpO2 94 %  MEWS Score  MEWS Temp 0  MEWS Systolic 0  MEWS Pulse 0  MEWS RR 0  MEWS LOC 0  MEWS Score 0    Patient tolerated well. No signs of dizziness or complaints of pain per patient.

## 2023-03-24 NOTE — Discharge Summary (Signed)
 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  Marilyn Cook is a 78 y.o. female with medical history significant of  of hypertension, hyperlipidemia, TIA 2017 and a diagnosis of dementia due to Alzheimer's disease ,hypothyroidism,GAD,HLD who presents to ED s/p episode of syncope and collapse with head strike.  Patient currently does not recall event, and noted feeling at her baseline prior to episode.  Per son patient had an episode of emesis after that incidence.  On presentation hemodynamically stable, trauma imaging series was unremarkable.  UA concerning for UTI.  Labs mostly unremarkable. Urine cultures were obtained and patient was started on ceftriaxone.  3/18: Vitals and labs stable.  Positive orthostatic blood pressure from sitting to standing which improved within 3 minutes.  MRI with no acute infarct.  Generalized atrophy with chronic small vessel ischemia and a small volume subarachnoid subacute to chronic blood products, no acute hemorrhage. Echocardiogram with normal EF and diastolic function.  No other abnormality. Depakote levels low at 29 PT is recommending home health.  Patient initially has positive orthostatic vitals which has been improved today  after giving IV fluid.  Repeat orthostatic vitals were negative.  Patient remained asymptomatic.  She received a dose of ceftriaxone.  Urine cultures growing 30,000 colonies of gram-negative rod-pending identification and susceptibility.  No urinary symptoms, patient does has urinary urgency but that seems chronic. As elderly with dementia presents different ways with UTI we decided to treat, she is being given 4 more days of Keflex to complete the course.  Her PCP can follow-up on final culture results.  She was advised to keep herself well-hydrated and follow-up with her providers closely for further recommendations.  She will continue her home medications.   Consultants: None Procedures performed: None Disposition: Home Diet recommendation:  Discharge Diet Orders (From admission, onward)     Start     Ordered   03/24/23 0000  Diet - low sodium heart healthy        03/24/23 1002           Regular diet DISCHARGE MEDICATION: Allergies as of 03/24/2023       Reactions   Aspirin    Unknown reaction   Diphenhydramine Hcl    Donepezil Other (See Comments)   GI upset   Egg-derived Products Hives, Swelling   Epinephrine    REACTION: UNSPECIFIED   Latex    REACTION: swelling   Other Other (See Comments)   GI upset, headache to DAIRY PRODUCTS   Penicillins    Sulfasalazine         Medication List     STOP taking these medications    Potassium 99 MG Tabs       TAKE these medications    atorvastatin 40 MG tablet Commonly known as: LIPITOR Take  1 tablet (40 mg total) by mouth daily.   cephALEXin 500 MG capsule Commonly known as: KEFLEX Take 1 capsule (500 mg total) by mouth 3 (three) times daily for 4 days.   clopidogrel 75 MG tablet Commonly known as: PLAVIX Take 1 tablet (75 mg total) by mouth daily.   co-enzyme Q-10 30 MG capsule Take 30 mg by mouth daily.   cyanocobalamin 500 MCG tablet Commonly known as: VITAMIN B12 Take 1 tablet (500 mcg total) by  mouth daily.   denosumab 60 MG/ML Sosy injection Commonly known as: PROLIA Inject 60 mg into the skin every 6 (six) months.   divalproex 125 MG DR tablet Commonly known as: DEPAKOTE TAKE 1 TABLET (125 MG TOTAL) BY MOUTH AT BEDTIME What changed: See the new instructions.   levothyroxine 25 MCG tablet Commonly known as: SYNTHROID Take 1 tablet (25 mcg total) by mouth daily before breakfast.   loratadine 10 MG tablet Commonly known as: CLARITIN Take 10 mg by mouth daily.   Magnesium 250 MG Caps Take 1 capsule by mouth at bedtime.   memantine 10 MG tablet Commonly known as: NAMENDA TAKE 1 TABLET BY MOUTH TWICE A DAY   rivastigmine 1.5 MG capsule Commonly known as: EXELON Take 1 cap at night for 2 weeks, then increase to 1 cap twice daily   sertraline 25 MG tablet Commonly known as: ZOLOFT Take 1 tablet (25 mg total) by mouth daily.   triamcinolone 0.147 MG/GM topical spray Commonly known as: Kenalog Apply topically 2 (two) times daily as needed (psoriasis rash on scalp).   VITAMIN B-6 PO Take 1 tablet by mouth daily.        Follow-up Information     Eustaquio Boyden, MD. Schedule an appointment as soon as possible for a visit in 1 week(s).   Specialty: Family Medicine Contact information: [address] [place] [address] [phone]                Discharge Exam: Ceasar Mons Weights   03/22/23 1909 03/24/23 0500  Weight: 58 kg 58 kg   General. Frail elderly lady,In no acute distress. Pulmonary.  Lungs clear bilaterally, normal respiratory effort. CV.  Regular rate and rhythm, no JVD, rub or murmur. Abdomen.  Soft, nontender, nondistended, BS positive. CNS.  Alert and oriented .  No focal neurologic deficit. Extremities.  No edema, no cyanosis, pulses intact and symmetrical. Psychiatry.  Judgment and insight appears normal.   Condition at discharge: stable  The results of significant diagnostics from this hospitalization (including  imaging, microbiology, ancillary and laboratory) are listed below for reference.   Imaging Studies: ECHOCARDIOGRAM COMPLETE Result Date: 03/23/2023    ECHOCARDIOGRAM REPORT   Patient Name:   Marilyn Cook Date of Exam: 03/23/2023 Medical Rec #:  [id number]      Height:       66.0 in Accession #:    [id number]     Weight:       127.9 lb Date of Birth:  [birth year]      BSA:          1.654 m Patient Age:    77 years       BP:           118/55 mmHg Patient Gender: F              HR:           85 bpm. Exam Location:  Inpatient Procedure: 2D Echo, Color Doppler and Cardiac Doppler (  Both Spectral and Color            Flow Doppler were utilized during procedure). Indications:    Syncope  History:        Patient has prior history of Echocardiogram examinations. TIA.  Sonographer:    Lamont Snowball Referring Phys: [id number] SARA-MAIZ A THOMAS IMPRESSIONS  1. Left ventricular ejection fraction, by estimation, is 60 to 65%. The left ventricle has normal function. The left ventricle has no regional wall motion abnormalities. Left ventricular diastolic parameters were normal.  2. Right ventricular systolic function is normal. The right ventricular size is normal.  3. The mitral valve is normal in structure. No evidence of mitral valve regurgitation. No evidence of mitral stenosis.  4. The aortic valve is normal in structure. Aortic valve regurgitation is not visualized. No aortic stenosis is present.  5. The inferior vena cava is normal in size with greater than 50% respiratory variability, suggesting right atrial pressure of 3 mmHg. FINDINGS  Left Ventricle: Left ventricular ejection fraction, by estimation, is 60 to 65%. The left ventricle has normal function. The left ventricle has no regional wall motion abnormalities. The left ventricular internal cavity size was normal in size. There is  no left ventricular hypertrophy. Left ventricular diastolic parameters were normal. Right Ventricle: The right ventricular size is normal.  No increase in right ventricular wall thickness. Right ventricular systolic function is normal. Left Atrium: Left atrial size was normal in size. Right Atrium: Right atrial size was normal in size. Pericardium: There is no evidence of pericardial effusion. Mitral Valve: The mitral valve is normal in structure. No evidence of mitral valve regurgitation. No evidence of mitral valve stenosis. MV peak gradient, 2.6 mmHg. The mean mitral valve gradient is 1.0 mmHg. Tricuspid Valve: The tricuspid valve is normal in structure. Tricuspid valve regurgitation is not demonstrated. No evidence of tricuspid stenosis. Aortic Valve: The aortic valve is normal in structure. Aortic valve regurgitation is not visualized. No aortic stenosis is present. Aortic valve mean gradient measures 4.0 mmHg. Aortic valve peak gradient measures 6.9 mmHg. Aortic valve area, by VTI measures 2.89 cm. Pulmonic Valve: The pulmonic valve was normal in structure. Pulmonic valve regurgitation is not visualized. No evidence of pulmonic stenosis. Aorta: The aortic root is normal in size and structure. Venous: The inferior vena cava is normal in size with greater than 50% respiratory variability, suggesting right atrial pressure of 3 mmHg. IAS/Shunts: No atrial level shunt detected by color flow Doppler.  LEFT VENTRICLE PLAX 2D LVIDd:         4.00 cm   Diastology LVIDs:         2.60 cm   LV e' medial:    7.40 cm/s LV PW:         0.80 cm   LV E/e' medial:  8.7 LV IVS:        0.70 cm   LV e' lateral:   10.00 cm/s LVOT diam:     2.00 cm   LV E/e' lateral: 6.4 LV SV:         69 LV SV Index:   42 LVOT Area:     3.14 cm  RIGHT VENTRICLE             IVC RV Basal diam:  3.00 cm     IVC diam: 1.70 cm RV S prime:     18.00 cm/s TAPSE (M-mode): 1.7 cm LEFT ATRIUM           Index  RIGHT ATRIUM           Index LA diam:      2.90 cm 1.75 cm/m   RA Area:     12.50 cm LA Vol (A2C): 23.0 ml 13.91 ml/m  RA Volume:   29.00 ml  17.54 ml/m LA Vol (A4C): 25.6 ml  15.48 ml/m  AORTIC VALVE AV Area (Vmax):    2.71 cm AV Area (Vmean):   2.72 cm AV Area (VTI):     2.89 cm AV Vmax:           131.00 cm/s AV Vmean:          96.400 cm/s AV VTI:            0.240 m AV Peak Grad:      6.9 mmHg AV Mean Grad:      4.0 mmHg LVOT Vmax:         113.00 cm/s LVOT Vmean:        83.600 cm/s LVOT VTI:          0.221 m LVOT/AV VTI ratio: 0.92  AORTA Ao Root diam: 3.20 cm Ao Asc diam:  3.00 cm MITRAL VALVE MV Area (PHT): 2.63 cm    SHUNTS MV Area VTI:   3.88 cm    Systemic VTI:  0.22 m MV Peak grad:  2.6 mmHg    Systemic Diam: 2.00 cm MV Mean grad:  1.0 mmHg MV Vmax:       0.80 m/s MV Vmean:      55.5 cm/s MV Decel Time: 288 msec MV E velocity: 64.50 cm/s MV A velocity: 81.20 cm/s MV E/A ratio:  0.79 Aditya Sabharwal Electronically signed by Dorthula Nettles Signature Date/Time: 03/23/2023/11:41:32 AM    Final    MR BRAIN WO CONTRAST Result Date: 03/23/2023 CLINICAL DATA:  Syncope/presyncope with cerebrovascular cause suspected EXAM: MRI HEAD WITHOUT CONTRAST TECHNIQUE: Multiplanar, multiecho pulse sequences of the brain and surrounding structures were obtained without intravenous contrast. COMPARISON:  Head CT from yesterday FINDINGS: Brain: No acute infarction, hemorrhage, hydrocephalus, extra-axial collection or mass lesion. FLAIR hyperintensity affecting a few sulci at the right occipital convexity, not noted on prior and not associated with any acute subarachnoid hemorrhage by preceding head CT. There is some gradient signal change in this area, favor nonacute blood products. No underlying cortical edema. Brain is generally atrophic. Chronic small vessel ischemia in the periventricular white matter. Vascular: Normal flow voids. Skull and upper cervical spine: Normal marrow signal Sinuses/Orbits: Minimal right mastoid opacification. IMPRESSION: 1. Small volume subarachnoid signal abnormality along the right occipital lobe, favor subacute to chronic blood products given the gradient  signal. No acute hemorrhage by prior head CT. 2. No acute infarct. 3. Generalized atrophy with chronic small vessel ischemia. Electronically Signed   By: Tiburcio Pea M.D.   On: 03/23/2023 04:12   CT Head Wo Contrast Result Date: 03/22/2023 CLINICAL DATA:  Neck trauma, fall, on blood thinners, level 2 trauma, head trauma EXAM: CT HEAD WITHOUT CONTRAST CT CERVICAL SPINE WITHOUT CONTRAST TECHNIQUE: Multidetector CT imaging of the head and cervical spine was performed following the standard protocol without intravenous contrast. Multiplanar CT image reconstructions of the cervical spine were also generated. RADIATION DOSE REDUCTION: This exam was performed according to the departmental dose-optimization program which includes automated exposure control, adjustment of the mA and/or kV according to patient size and/or use of iterative reconstruction technique. COMPARISON:  CT head 02/13/2022 FINDINGS: CT HEAD FINDINGS Brain: No intracranial hemorrhage, mass effect, or  evidence of acute infarct. No hydrocephalus. No extra-axial fluid collection. Age related cerebral atrophy and chronic small vessel ischemic disease. Vascular: No hyperdense vessel. Intracranial arterial calcification. Skull: No fracture or focal lesion. Sinuses/Orbits: Frothy mucous in the left sphenoid sinus. The paranasal sinuses and mastoid air cells are otherwise well aerated. Other: None. CT CERVICAL SPINE FINDINGS Alignment: No evidence of traumatic malalignment. Skull base and vertebrae: No acute fracture. No primary bone lesion or focal pathologic process. Soft tissues and spinal canal: No prevertebral fluid or swelling. No visible canal hematoma. Disc levels: Multilevel spondylosis, disc space height loss, and degenerative endplate changes greatest at C4-C5 where it is moderate to advanced. Multilevel facet arthropathy with ankylosis of the right C2-C3 and C4-C5 facets. No severe spinal canal narrowing. Upper chest: No acute abnormality.  Other: Carotid calcification. IMPRESSION: 1. No acute intracranial abnormality. 2. No acute fracture in the cervical spine. Electronically Signed   By: Minerva Fester M.D.   On: 03/22/2023 20:08   CT Cervical Spine Wo Contrast Result Date: 03/22/2023 CLINICAL DATA:  Neck trauma, fall, on blood thinners, level 2 trauma, head trauma EXAM: CT HEAD WITHOUT CONTRAST CT CERVICAL SPINE WITHOUT CONTRAST TECHNIQUE: Multidetector CT imaging of the head and cervical spine was performed following the standard protocol without intravenous contrast. Multiplanar CT image reconstructions of the cervical spine were also generated. RADIATION DOSE REDUCTION: This exam was performed according to the departmental dose-optimization program which includes automated exposure control, adjustment of the mA and/or kV according to patient size and/or use of iterative reconstruction technique. COMPARISON:  CT head 02/13/2022 FINDINGS: CT HEAD FINDINGS Brain: No intracranial hemorrhage, mass effect, or evidence of acute infarct. No hydrocephalus. No extra-axial fluid collection. Age related cerebral atrophy and chronic small vessel ischemic disease. Vascular: No hyperdense vessel. Intracranial arterial calcification. Skull: No fracture or focal lesion. Sinuses/Orbits: Frothy mucous in the left sphenoid sinus. The paranasal sinuses and mastoid air cells are otherwise well aerated. Other: None. CT CERVICAL SPINE FINDINGS Alignment: No evidence of traumatic malalignment. Skull base and vertebrae: No acute fracture. No primary bone lesion or focal pathologic process. Soft tissues and spinal canal: No prevertebral fluid or swelling. No visible canal hematoma. Disc levels: Multilevel spondylosis, disc space height loss, and degenerative endplate changes greatest at C4-C5 where it is moderate to advanced. Multilevel facet arthropathy with ankylosis of the right C2-C3 and C4-C5 facets. No severe spinal canal narrowing. Upper chest: No acute  abnormality. Other: Carotid calcification. IMPRESSION: 1. No acute intracranial abnormality. 2. No acute fracture in the cervical spine. Electronically Signed   By: Minerva Fester M.D.   On: 03/22/2023 20:08   DG Pelvis Portable Result Date: 03/22/2023 CLINICAL DATA:  Status post fall. EXAM: PORTABLE PELVIS 1-2 VIEWS COMPARISON:  July 02, 2016 FINDINGS: There is no evidence of pelvic fracture or diastasis. No pelvic bone lesions are seen. IMPRESSION: Negative. Electronically Signed   By: Aram Candela M.D.   On: 03/22/2023 19:31   DG Chest Portable 1 View Result Date: 03/22/2023 CLINICAL DATA:  Status post fall. EXAM: PORTABLE CHEST 1 VIEW COMPARISON:  February 24, 2020 FINDINGS: The heart size and mediastinal contours are within normal limits. Both lungs are clear. Postoperative changes are seen involving the right shoulder. Multilevel degenerative changes are noted throughout the thoracic spine. IMPRESSION: No active cardiopulmonary disease. Electronically Signed   By: Aram Candela M.D.   On: 03/22/2023 19:30    Microbiology: Results for orders placed or performed during the hospital encounter of 03/22/23  Urine Culture (for pregnant, neutropenic or urologic patients or patients with an indwelling urinary catheter)     Status: Abnormal (Preliminary result)   Collection Time: 03/22/23  7:20 PM   Specimen: Urine, Clean Catch  Result Value Ref Range Status   Specimen Description URINE, CLEAN CATCH  Final   Special Requests NONE  Final   Culture (A)  Final    30,000 COLONIES/mL GRAM NEGATIVE RODS SUSCEPTIBILITIES TO FOLLOW Performed at [facility], [address] [place], [place] [address]    Report Status PENDING  Incomplete    Labs: CBC: Recent Labs  Lab 03/22/23 2000 03/23/23 0134 03/24/23 0500  WBC 4.3 5.9 5.0  NEUTROABS 2.3  --   --   HGB 14.7 14.7 12.4  HCT 43.7 44.1 37.4  MCV 97.8 97.4 98.4  PLT 218 172 182   Basic Metabolic Panel: Recent Labs  Lab  03/22/23 2000 03/23/23 0134 03/24/23 0500  NA 141 140 143  K 3.7 3.7 3.6  CL 105 107 112*  CO2 26 23 23   GLUCOSE 96 109* 100*  BUN 5* <5* 10  CREATININE 0.65 0.68 0.67  CALCIUM 7.9* 7.6* 7.0*  MG 2.2  --  2.1  PHOS  --  2.1*  --    Liver Function Tests: Recent Labs  Lab 03/22/23 2000 03/23/23 0134  AST 31 31  ALT 21 19  ALKPHOS 71 65  BILITOT 0.6 0.8  PROT 6.8 6.6  ALBUMIN 3.6 3.6   CBG: Recent Labs  Lab 03/23/23 0737 03/23/23 2108 03/24/23 0958  GLUCAP 92 85 93    Discharge time spent: greater than 30 minutes.  This record has been created using Conservation officer, historic buildings. Errors have been sought and corrected,but may not always be located. Such creation errors do not reflect on the standard of care.   Signed: Arnetha Courser, MD [facility] 03/24/2023

## 2023-03-24 NOTE — TOC Transition Note (Addendum)
 Transition of Care [facility]) - Discharge Note   Patient Details  Name: Marilyn Cook MRN: [id number] Date of Birth: Jul 23, 1945  [facility] CM/SW Contact:  Gordy Clement, RN Phone Number: 03/24/2023, 10:13 AM   Clinical Narrative:    Update:  Patient now requesting a RW  [employer] to deliver prior to DC    Patient will DC to home today, Home Health PT and OT have been recommended and will be provided by [facility]   No DME has been recommended. AVS updated Family to transport  No additional TOC needs . Patient is insured and has a PCP  Patient will follow up as directed on AVS            Patient Goals and CMS Choice            Discharge Placement                       Discharge Plan and Services Additional resources added to the After Visit Summary for                                       Social Drivers of Health (SDOH) Interventions SDOH Screenings   Food Insecurity: No Food Insecurity (03/23/2023)  Housing: Low Risk  (03/23/2023)  Transportation Needs: No Transportation Needs (03/23/2023)  Utilities: Not At Risk (03/23/2023)  Alcohol Screen: Low Risk  (05/12/2022)  Depression (PHQ2-9): Low Risk  (05/12/2022)  Financial Resource Strain: Low Risk  (05/12/2022)  Physical Activity: Inactive (05/12/2022)  Social Connections: Moderately Integrated (03/23/2023)  Stress: No Stress Concern Present (05/12/2022)  Tobacco Use: Low Risk  (03/23/2023)     Readmission Risk Interventions     No data to display

## 2023-03-24 NOTE — Hospital Course (Addendum)
 Taken from H&P.  Marilyn Cook is a 78 y.o. female with medical history significant of  of hypertension, hyperlipidemia, TIA 2017 and a diagnosis of dementia due to Alzheimer's disease ,hypothyroidism,GAD,HLD who presents to ED s/p episode of syncope and collapse with head strike.  Patient currently does not recall event, and noted feeling at her baseline prior to episode.  Per son patient had an episode of emesis after that incidence.  On presentation hemodynamically stable, trauma imaging series was unremarkable.  UA concerning for UTI.  Labs mostly unremarkable. Urine cultures were obtained and patient was started on ceftriaxone.  3/18: Vitals and labs stable.  Positive orthostatic blood pressure from sitting to standing which improved within 3 minutes.  MRI with no acute infarct.  Generalized atrophy with chronic small vessel ischemia and a small volume subarachnoid subacute to chronic blood products, no acute hemorrhage. Echocardiogram with normal EF and diastolic function.  No other abnormality. Depakote levels low at 29 PT is recommending home health.  Patient initially has positive orthostatic vitals which has been improved today after giving IV fluid.  Repeat orthostatic vitals were negative.  Patient remained asymptomatic.  She received a dose of ceftriaxone.  Urine cultures growing 30,000 colonies of gram-negative rod-pending identification and susceptibility.  No urinary symptoms, patient does has urinary urgency but that seems chronic. As elderly with dementia presents different ways with UTI we decided to treat, she is being given 4 more days of Keflex to complete the course.  Her PCP can follow-up on final culture results.  She was advised to keep herself well-hydrated and follow-up with her providers closely for further recommendations.  She will continue her home medications.

## 2023-03-25 ENCOUNTER — Telehealth: Payer: Self-pay

## 2023-03-25 NOTE — Transitions of Care (Post Inpatient/ED Visit) (Signed)
 03/25/2023  Name: Marilyn Cook MRN: [id number] DOB: 03-25-1945  Today's TOC FU Call Status: Today's TOC FU Call Status:: Successful TOC FU Call Completed TOC FU Call Complete Date: 03/25/23 Patient's Name and Date of Birth confirmed.  Transition Care Management Follow-up Telephone Call Date of Discharge: 03/24/23 Discharge Facility: [facility]) Type of Discharge: Inpatient Admission Primary Inpatient Discharge Diagnosis:: syncope How have you been since you were released from the hospital?: Better Any questions or concerns?: No  Items Reviewed: Did you receive and understand the discharge instructions provided?: Yes Medications obtained,verified, and reconciled?: Yes (Medications Reviewed) Any new allergies since your discharge?: No Dietary orders reviewed?: Yes Do you have support at home?: No  Medications Reviewed Today: Medications Reviewed Today     Reviewed by Karena Addison, LPN (Licensed Practical Nurse) on 03/25/23 at [phone]  Med List Status: <None>   Medication Order Taking? Sig Documenting Provider Last Dose Status Informant  atorvastatin (LIPITOR) 40 MG tablet [id number] No Take 1 tablet (40 mg total) by mouth daily. Eustaquio Boyden, MD 03/22/2023 Bedtime Active Family Member  cephALEXin (KEFLEX) 500 MG capsule [id number]  Take 1 capsule (500 mg total) by mouth 3 (three) times daily for 4 days. Arnetha Courser, MD  Active   clopidogrel (PLAVIX) 75 MG tablet [id number] No Take 1 tablet (75 mg total) by mouth daily. Eustaquio Boyden, MD 03/22/2023 Bedtime Active Family Member  co-enzyme Q-10 30 MG capsule [id number] No Take 30 mg by mouth daily.  Patient not taking: Reported on 03/22/2023   [provider] Not Taking Active Family Member  cyanocobalamin (VITAMIN B12) 500 MCG tablet [id number] No Take 1 tablet (500 mcg total) by mouth daily. Eustaquio Boyden, MD 03/22/2023 Morning Active Family Member  denosumab (PROLIA) 60 MG/ML SOSY injection [id number] No  Inject 60 mg into the skin every 6 (six) months. Eustaquio Boyden, MD Past Month Active Family Member  divalproex (DEPAKOTE) 125 MG DR tablet [id number] No TAKE 1 TABLET (125 MG TOTAL) BY MOUTH AT BEDTIME  Patient taking differently: Take 125 mg by mouth daily.   Marcos Eke, PA-C 03/22/2023 Morning Active Family Member  levothyroxine (SYNTHROID) 25 MCG tablet [id number] No Take 1 tablet (25 mcg total) by mouth daily before breakfast. Eustaquio Boyden, MD 03/22/2023 Morning Active Family Member  loratadine (CLARITIN) 10 MG tablet [id number] No Take 10 mg by mouth daily. [provider] 03/22/2023 Morning Active Family Member  Magnesium 250 MG CAPS [id number] No Take 1 capsule by mouth at bedtime. Eustaquio Boyden, MD 03/22/2023 Bedtime Active Family Member  memantine (NAMENDA) 10 MG tablet [id number] No TAKE 1 TABLET BY MOUTH TWICE A DAY Elwyn Reach 03/22/2023 Evening Active Family Member  Pyridoxine HCl (VITAMIN B-6 PO) [id number] No Take 1 tablet by mouth daily. [provider] 03/22/2023 Morning Active Family Member  rivastigmine (EXELON) 1.5 MG capsule [id number] No Take 1 cap at night for 2 weeks, then increase to 1 cap twice daily Elwyn Reach 03/22/2023 Bedtime Active Family Member  sertraline (ZOLOFT) 25 MG tablet [id number] No Take 1 tablet (25 mg total) by mouth daily. Eustaquio Boyden, MD 03/22/2023 Bedtime Active Family Member  triamcinolone (KENALOG) 0.147 MG/GM topical spray [id number] No Apply topically 2 (two) times daily as needed (psoriasis rash on scalp).  Patient not taking: Reported on 03/22/2023   Eustaquio Boyden, MD Not Taking Active Family Member            Home Care and Equipment/Supplies: Were Home Health Services Ordered?: Yes Name  of Home Health Agency:: unknown Has Agency set up a time to come to your home?: No Any new equipment or medical supplies ordered?: Yes Name of Medical supply agency?: [facility] Were you able to get the  equipment/medical supplies?: Yes Do you have any questions related to the use of the equipment/supplies?: No  Functional Questionnaire: Do you need assistance with bathing/showering or dressing?: No Do you need assistance with meal preparation?: No Do you need assistance with eating?: No Do you have difficulty maintaining continence: No Do you need assistance with getting out of bed/getting out of a chair/moving?: No Do you have difficulty managing or taking your medications?: No  Follow up appointments reviewed: PCP Follow-up appointment confirmed?: Yes Date of PCP follow-up appointment?: 04/03/23 Follow-up Provider: [facility] Follow-up appointment confirmed?: NA Do you need transportation to your follow-up appointment?: No Do you understand care options if your condition(s) worsen?: Yes-patient verbalized understanding    SIGNATURE Karena Addison, LPN [facility] Nurse Health Advisor Direct Dial [phone]

## 2023-03-27 ENCOUNTER — Telehealth: Payer: Self-pay

## 2023-03-27 NOTE — Telephone Encounter (Signed)
 Agree with this. Thanks.

## 2023-03-27 NOTE — Telephone Encounter (Signed)
 Copied from CRM [id number]. Topic: Clinical - Home Health Verbal Orders >> Mar 27, 2023 11:33 AM Florestine Avers wrote: Caller/Agency: Elmer Bales called in from [facility] Callback Number: [phone] (please leave voicemail) Service Requested: Physical Therapy Frequency: 1 week 1, 2 week 2, 1 week 2. Any new concerns about the patient? No

## 2023-03-30 ENCOUNTER — Other Ambulatory Visit: Payer: Self-pay | Admitting: Family Medicine

## 2023-03-30 DIAGNOSIS — R1030 Lower abdominal pain, unspecified: Secondary | ICD-10-CM

## 2023-03-30 DIAGNOSIS — N3000 Acute cystitis without hematuria: Secondary | ICD-10-CM

## 2023-03-30 NOTE — Telephone Encounter (Signed)
 Lvm (per message below) informing Claris Pong [facility] that Dr Reece Agar is giving verbal orders for services requested for pt.

## 2023-04-03 ENCOUNTER — Encounter: Payer: Self-pay | Admitting: Family Medicine

## 2023-04-03 ENCOUNTER — Ambulatory Visit: Admitting: Family Medicine

## 2023-04-03 DIAGNOSIS — G309 Alzheimer's disease, unspecified: Secondary | ICD-10-CM | POA: Diagnosis not present

## 2023-04-03 DIAGNOSIS — Z7189 Other specified counseling: Secondary | ICD-10-CM | POA: Diagnosis not present

## 2023-04-03 DIAGNOSIS — W1830XA Fall on same level, unspecified, initial encounter: Secondary | ICD-10-CM

## 2023-04-03 DIAGNOSIS — N390 Urinary tract infection, site not specified: Secondary | ICD-10-CM | POA: Diagnosis not present

## 2023-04-03 DIAGNOSIS — F0280 Dementia in other diseases classified elsewhere without behavioral disturbance: Secondary | ICD-10-CM | POA: Diagnosis not present

## 2023-04-03 LAB — BASIC METABOLIC PANEL WITH GFR
BUN: 7 mg/dL (ref 6–23)
CO2: 30 meq/L (ref 19–32)
Calcium: 8.5 mg/dL (ref 8.4–10.5)
Chloride: 103 meq/L (ref 96–112)
Creatinine, Ser: 0.69 mg/dL (ref 0.40–1.20)
GFR: 83.07 mL/min (ref >60.00)
Glucose, Bld: 95 mg/dL (ref 70–99)
Potassium: 4.1 meq/L (ref 3.5–5.1)
Sodium: 140 meq/L (ref 135–145)

## 2023-04-03 LAB — URINE CULTURE: Culture: 30000 — AB

## 2023-04-03 LAB — SUSCEPTIBILITY RESULT

## 2023-04-03 LAB — SUSCEPTIBILITY, AER + ANAEROB

## 2023-04-03 LAB — PHOSPHORUS: Phosphorus: 2.1 mg/dL — ABNORMAL LOW (ref 2.3–4.6)

## 2023-04-03 MED ORDER — CIPROFLOXACIN HCL 500 MG PO TABS: 500.0000 mg | ORAL_TABLET | Freq: Two times a day (BID) | ORAL | 0 refills | Status: AC

## 2023-04-03 MED ORDER — CALCIUM-VITAMIN D-MINERALS 600-400 MG-UNIT PO CHEW: 1.0000 | CHEWABLE_TABLET | Freq: Every day | ORAL | Status: AC

## 2023-04-03 MED ORDER — VITAMIN D3 25 MCG (1000 UT) PO CAPS: 1.0000 | ORAL_CAPSULE | Freq: Every day | ORAL | Status: AC

## 2023-04-03 NOTE — Progress Notes (Signed)
 Ph: [phone] Fax: [phone]   Patient ID: Marilyn Cook, female    DOB: Nov 17, 1945, 78 y.o.   MRN: [id number]  This visit was conducted in person.  BP 122/82   Pulse 77   Temp 98.1 F (36.7 C) (Oral)   Ht 5\' 6"  (1.676 m)   Wt 125 lb (56.7 kg)   SpO2 98%   BMI 20.18 kg/m    CC: hosp f/u visit  Subjective:   HPI: Marilyn Cook is a 78 y.o. female presenting on 04/03/2023 for Hospitalization Follow-up (Admitted on 03/22/23 at [facility], dx syncope; ground-level fall. Pt accompanied by son, "[name]". )   Recent hospitalization for fall with possible head injury and episode of vomiting. She does not remember event. This was a witnessed fall. Son notes she slowly eased her way down to crouching position then fell forward - so not likely true syncope.  UA was suspicious for UTI however she was largely asymptomatic (no dysuria, urgency, frequency, flank pain, fever). She was started on ceftriaxone followed by oral keflex which she completed. Culture returned growing 30k pseudomonas aeruginosa, sensitivities pending.  Orthostatics positive in hospital - treated with IVF with benefit.  Hospital records reviewed. Med rec performed.  Workup largely reassuring brain MRI, head and neck CT, echocardiogram.  Brain MRI showed signal abnormality to subarachnoid space along R occipital lobe thought chronic blood products.   Calcium levels were markedly low (7.9, 7.0) - she is not taking any calcium at this time.  OP on Prolia, received latest Prolia shot on 02/11/2023.   Known Alzheimer's dementia regularly sees [facility] neurology managed on memantine 10mg  bid, last visit she was started on rivastigmine 1.5mg  BID. Also continues depakote 125mg  BID for h/o hallucinations and sundowning.  Aide has started coming out to house a few times a week.   Home health  set up with [facility] - planned PT.  Other follow up appointments scheduled: none.   Advanced directive discussion: advanced directive reviewed,  scanned 04/2023. Son Marilyn Cook is HCPOA, would not desire prolonged life support if terminal condition. Wants living will followed.  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 Interim medical history since our last visit reviewed. Allergies and medications reviewed and updated. Outpatient Medications Prior to Visit  Medication Sig Dispense Refill  . atorvastatin (LIPITOR) 40 MG tablet Take 1 tablet (40 mg total) by mouth daily. 90 tablet 4  . clopidogrel (PLAVIX) 75 MG tablet Take 1 tablet (75 mg total) by mouth daily. 90 tablet 4  . co-enzyme Q-10 30 MG capsule Take 30 mg by mouth daily.    . cyanocobalamin (VITAMIN B12) 500 MCG tablet Take 1 tablet (500 mcg total) by mouth daily.    [name] denosumab (PROLIA) 60 MG/ML SOSY injection Inject 60 mg into the skin every 6 (six) months. 1 mL 1  .  divalproex (DEPAKOTE) 125 MG DR tablet TAKE 1 TABLET (125 MG TOTAL) BY MOUTH AT BEDTIME (Patient taking differently: Take 125 mg by mouth daily.) 90 tablet 1  . levothyroxine (SYNTHROID) 25 MCG tablet Take 1 tablet (25 mcg total) by mouth daily before breakfast. 90 tablet 4  . loratadine (CLARITIN) 10 MG tablet Take 10 mg by mouth daily.    . Magnesium  250 MG CAPS Take 1 capsule by mouth at bedtime.    . memantine (NAMENDA) 10 MG tablet TAKE 1 TABLET BY MOUTH TWICE A DAY 180 tablet 1  . Pyridoxine HCl (VITAMIN B-6 PO) Take 1 tablet by mouth daily.    . rivastigmine (EXELON) 1.5 MG capsule Take 1 cap at night for 2 weeks, then increase to 1 cap twice daily 60 capsule 11  . sertraline (ZOLOFT) 25 MG tablet Take 1 tablet (25 mg total) by mouth daily. 90 tablet 4  . triamcinolone (KENALOG) 0.147 MG/GM topical spray Apply topically 2 (two) times daily as needed (psoriasis rash on scalp). 63 g 3   No facility-administered medications prior to visit.     Per HPI unless specifically indicated in ROS section below Review of Systems  Objective:  BP 122/82   Pulse 77   Temp 98.1 F (36.7 C) (Oral)   Ht 5\' 6"  (1.676 m)   Wt 125 lb (56.7 kg)   SpO2 98%   BMI 20.18 kg/m   Wt Readings from Last 3 Encounters:  04/03/23 125 lb (56.7 kg)  03/24/23 127 lb 13.9 oz (58 kg)  03/09/23 127 lb (57.6 kg)      Physical Exam Vitals and nursing note reviewed.  Constitutional:      Appearance: Normal appearance. She is not ill-appearing.  HENT:     Mouth/Throat:     Mouth: Mucous membranes are moist.     Pharynx: Oropharynx is clear. No oropharyngeal exudate or posterior oropharyngeal erythema.  Eyes:     Extraocular Movements: Extraocular movements intact.     Conjunctiva/sclera: Conjunctivae normal.     Pupils: Pupils are equal, round, and reactive to light.  Cardiovascular:     Rate and Rhythm: Normal rate and regular rhythm.     Pulses: Normal pulses.     Heart sounds: Normal heart sounds. No murmur heard. Pulmonary:     Effort: Pulmonary effort is normal. No respiratory distress.     Breath sounds: Normal breath sounds. No wheezing, rhonchi or rales.  Musculoskeletal:     Right lower leg: No edema.     Left lower leg: No edema.  Skin:    General: Skin is warm and dry.     Findings: No rash.  Neurological:     Mental Status: She is  alert.  Psychiatric:        Mood and Affect: Mood normal.        Behavior: Behavior normal.      Results for orders placed or performed in visit on 04/03/23  Basic metabolic panel with GFR   Collection Time: 04/03/23 12:15 PM  Result Value Ref Range   Sodium 140 135 - 145 mEq/L   Potassium 4.1 3.5 - 5.1 mEq/L   Chloride 103 96 - 112 mEq/L   CO2 30 19 - 32 mEq/L   Glucose, Bld 95 70 - 99 mg/dL   BUN 7 6 - 23 mg/dL   Creatinine, Ser 2.13 0.40 - 1.20 mg/dL   GFR 08.65 >78.46 mL/min   Calcium 8.5 8.4 - 10.5 mg/dL  Phosphorus   Collection Time: 04/03/23 12:15 PM  Result Value Ref Range   Phosphorus 2.1 (L) 2.3 - 4.6 mg/dL   Lab Results  Component Value Date   VD25OH 35.64 08/11/2022    Lab Results  Component Value Date   COLORU Yellow 10/15/2021   CLARITYU slightly cloudy 10/15/2021   GLUCOSEUR Negative 10/15/2021   BILIRUBINUR NEGATIVE 03/22/2023   KETONESU negative 10/15/2021   SPECGRAV 1.010 10/15/2021   RBCUR trace 10/15/2021   PHUR 8.0 10/15/2021   PROTEINUR NEGATIVE 03/22/2023   UROBILINOGEN 0.2 10/15/2021   LEUKOCYTESUR LARGE (A) 03/22/2023   Lab Results  Component Value Date   WBC 5.0 03/24/2023   HGB 12.4 03/24/2023   HCT 37.4 03/24/2023   MCV 98.4 03/24/2023   PLT 182 03/24/2023    03/22/2023:  Urine Culture  Abnormal  30,000 COLONIES/mL PSEUDOMONAS AERUGINOSA Sent to [facility] for further susceptibility testing. SEE SEPARATE REPORT Performed at [facility], [address], [place], [place] [address]  Sensitivities: Amikacin                       S  Cefepime                       S  Ceftazidime                    S  Ceftazidime/avibactam          S  Ceftolozane/tazobactam         S  Ciprofloxacin                  S  Levofloxacin                   S  Meropenem                      S  Piperacillin/Tazobactam        S  Tobramycin                     S   MRI Brain 03/2023 IMPRESSION: 1. Small volume subarachnoid signal abnormality along the  right occipital lobe, favor subacute to chronic blood products given the gradient signal. No acute hemorrhage by prior head CT. 2. No acute infarct. 3. Generalized atrophy with chronic small vessel ischemia.  Echocardiogram 03/2023:  LVEF 60-65%, normal LV function, normal wall motion, normal RV function, normal valves, normal RA pressure  Assessment & Plan:   Problem List Items Addressed This Visit     Advanced directives, counseling/discussion (Chronic)   Advanced directive discussion: advanced directive reviewed, scanned 04/2023. Son Kamilya Wakeman is HCPOA, would not desire prolonged life support if terminal condition. Wants living will followed.       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 Now on memantine 10mg  BID, rivastigmine PO 1.5mg  BID, depakote 125mg  QHS.       Urinary tract infection without hematuria   Suspected given acute confusional state related to recent fall at home however didn't have typical UTI symptoms. Dementia obfuscates clinical picture.  UCx grew pseudomonas - resistant to keflex course. Will Rx ciprofloxacin 500mg  bid x3d course.  Discussed probiotic use after recent abx courses.       Ground-level fall   Witnessed fall at home followed by episode of vomiting x1. Unsure if true syncope/LOC. She was alert quickly after fall - not suggestive  of seizure.  Hospital eval revealed hypocalcemia, down to 7.0. See below.  Update BMP.       Hypocalcemia - Primary   Marked hypocalcemia identified 40 days after latest Prolia shot (02/11/2023).  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  Will update BMP, start calcium supplementation Ca 600/vit D 400u daily. Also reviewed increasing dietary calcium intake.  Reconsider Prolia treatment prior to next injection (due 08/2023).       Relevant Orders   Basic metabolic panel with GFR (Completed)   Phosphorus (Completed)     Meds ordered this encounter  Medications  .  ciprofloxacin (CIPRO) 500 MG tablet    Sig: Take 1 tablet (500 mg total) by mouth 2 (two) times daily for 3 days.    Dispense:  6 tablet    Refill:  0  . Calcium Carbonate-Vit D-Min (CALCIUM-VITAMIN D-MINERALS) 600-400 MG-UNIT CHEW    Sig: Chew 1 tablet by mouth daily.  . Cholecalciferol (VITAMIN D3) 25 MCG (1000 UT) CAPS    Sig: Take 1 capsule (1,000 Units total) by mouth daily.    Orders Placed This Encounter  Procedures  . Basic metabolic panel with GFR  . Phosphorus    Patient Instructions  Labs today  Work on calcium intake in diet - goal 1200mg  daily. Start calcium 600mg  tablet daily - may choose one that has vitamin D 400 units in it (combo pill).   Try to get most or all of your calcium from your food--aim for 1200 mg/day.  To figure out dietary calcium: 300 mg/day from all non dairy foods plus 300 mg per cup of milk, other dairy, or fortified juice. Non dairy foods that contain calcium: Kale, oranges, sardines, oatmeal, soy milk/soybeans, salmon, white beans, dried figs, turnip greens, almonds, broccoli, tofu.   Take ciprofloxacin 500mg  twice daily for 3 days - antibiotic that will cover pseudomonas UTI infection.  Take activia or other probiotic after finishing antibiotics.  Schedule lab visit in 2 weeks for urine sample check.   Follow up plan: No follow-ups on file.  Eustaquio Boyden, MD

## 2023-04-03 NOTE — Patient Instructions (Addendum)
 Labs today  Work on calcium intake in diet - goal 1200mg  daily. Start calcium 600mg  tablet daily - may choose one that has vitamin D 400 units in it (combo pill).   Try to get most or all of your calcium from your food--aim for 1200 mg/day.  To figure out dietary calcium: 300 mg/day from all non dairy foods plus 300 mg per cup of milk, other dairy, or fortified juice. Non dairy foods that contain calcium: Kale, oranges, sardines, oatmeal, soy milk/soybeans, salmon, white beans, dried figs, turnip greens, almonds, broccoli, tofu.   Take ciprofloxacin 500mg  twice daily for 3 days - antibiotic that will cover pseudomonas UTI infection.  Take activia or other probiotic after finishing antibiotics.  Schedule lab visit in 2 weeks for urine sample check.

## 2023-04-04 ENCOUNTER — Encounter: Payer: Self-pay | Admitting: Family Medicine

## 2023-04-04 NOTE — Assessment & Plan Note (Signed)
 Witnessed fall at home followed by episode of vomiting x1. Unsure if true syncope/LOC. She was alert quickly after fall - not suggestive of seizure.  Hospital eval revealed hypocalcemia, down to 7.0. See below.  Update BMP.

## 2023-04-04 NOTE — Assessment & Plan Note (Signed)
 Appreciate neurology care. Now on memantine 10mg  BID, rivastigmine PO 1.5mg  BID, depakote 125mg  QHS.

## 2023-04-04 NOTE — Assessment & Plan Note (Addendum)
 Suspected given acute confusional state related to recent fall at home however didn't have typical UTI symptoms. Dementia obfuscates clinical picture.  UCx grew pseudomonas - resistant to keflex course. Will Rx ciprofloxacin 500mg  bid x3d course.  Discussed probiotic use after recent abx courses.

## 2023-04-04 NOTE — Assessment & Plan Note (Addendum)
 Marked hypocalcemia identified 40 days after latest Prolia shot (02/11/2023).  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  Will update BMP, start calcium supplementation Ca 600/vit D 400u daily. Also reviewed increasing dietary calcium intake.  Reconsider Prolia treatment prior to next injection (due 08/2023).

## 2023-04-06 NOTE — Assessment & Plan Note (Signed)
 Advanced directive discussion: advanced directive reviewed, scanned 04/2023. Son Samarra Ridgely is HCPOA, would not desire prolonged life support if terminal condition. Wants living will followed.

## 2023-04-11 ENCOUNTER — Other Ambulatory Visit: Payer: Self-pay | Admitting: Family Medicine

## 2023-04-11 DIAGNOSIS — N390 Urinary tract infection, site not specified: Secondary | ICD-10-CM

## 2023-04-17 ENCOUNTER — Other Ambulatory Visit (INDEPENDENT_AMBULATORY_CARE_PROVIDER_SITE_OTHER)

## 2023-04-17 DIAGNOSIS — N390 Urinary tract infection, site not specified: Secondary | ICD-10-CM | POA: Diagnosis not present

## 2023-04-17 LAB — URINALYSIS, ROUTINE W REFLEX MICROSCOPIC
Bilirubin Urine: NEGATIVE
Hgb urine dipstick: NEGATIVE
Leukocytes,Ua: NEGATIVE
Nitrite: NEGATIVE
Specific Gravity, Urine: 1.015 (ref 1.000–1.030)
Total Protein, Urine: NEGATIVE
Urine Glucose: NEGATIVE
Urobilinogen, UA: 0.2 (ref 0.0–1.0)
pH: 7.5 (ref 5.0–8.0)

## 2023-04-20 ENCOUNTER — Encounter: Payer: Self-pay | Admitting: Family Medicine

## 2023-04-20 ENCOUNTER — Telehealth: Payer: Self-pay

## 2023-04-20 NOTE — Telephone Encounter (Signed)
 Copied from CRM [id number]. Topic: Clinical - Home Health Verbal Orders >> Apr 20, 2023  1:37 PM [place] J wrote: Caller/Agency: Sherline Distel / [facility] Callback Number: [phone]  Service Requested: Physical Therapy Frequency: 14 week 3 weeks Any new concerns about the patient? No

## 2023-04-20 NOTE — Telephone Encounter (Signed)
 Agree with this. Thanks.

## 2023-04-20 NOTE — Telephone Encounter (Signed)
 Lvm asking Sherline Distel of [facility], to call back. Need to inform her Dr Crissie Dome is giving verbal orders for services requested for pt.

## 2023-04-21 ENCOUNTER — Telehealth: Payer: Self-pay

## 2023-04-21 NOTE — Telephone Encounter (Deleted)
 Copied from CRM [id number]. Topic: Clinical - Home Health Verbal Orders >> Apr 20, 2023  5:19 PM Luane Rumps D wrote: Marilyn Cook with [facility] calling back for verbal orders from Dr. Crissie Dome, stated that verbal order can be left via her VM: [phone]

## 2023-04-21 NOTE — Telephone Encounter (Signed)
 Called Marilyn Cook to verify what verbal orders she was needing. 1 x week for 3 weeks extension for PT.

## 2023-04-21 NOTE — Telephone Encounter (Signed)
 Copied from CRM [id number]. Topic: Clinical - Home Health Verbal Orders >> Apr 20, 2023  5:19 PM Luane Rumps D wrote: Marilyn Cook with [facility] calling back for verbal orders from Dr. Crissie Dome, stated that verbal order can be left via her VM: [phone]

## 2023-04-21 NOTE — Telephone Encounter (Signed)
 Looks like there was a mistake in duration for PT in original message below.    Sherline Distel rtn call. Correct duration/frequency requested (see other 04/20/23 phn note) documented in new phn note and Sherline Distel is aware verbal orders given.

## 2023-04-21 NOTE — Telephone Encounter (Signed)
 Spoke with Sherline Distel informing her Dr Crissie Dome is giving verbal orders for services requested for pt .

## 2023-04-21 NOTE — Telephone Encounter (Signed)
 Lvm asking Sherline Distel of [facility], to call back. Need to inform her Dr Crissie Dome is giving verbal orders for services requested for pt.

## 2023-04-21 NOTE — Telephone Encounter (Signed)
 Agree with this. Thanks.

## 2023-05-05 ENCOUNTER — Other Ambulatory Visit: Payer: Self-pay | Admitting: Physician Assistant

## 2023-05-12 DIAGNOSIS — Z7902 Long term (current) use of antithrombotics/antiplatelets: Secondary | ICD-10-CM | POA: Diagnosis not present

## 2023-05-12 DIAGNOSIS — Z556 Problems related to health literacy: Secondary | ICD-10-CM | POA: Diagnosis not present

## 2023-05-12 DIAGNOSIS — E785 Hyperlipidemia, unspecified: Secondary | ICD-10-CM | POA: Diagnosis not present

## 2023-05-12 DIAGNOSIS — F02A4 Dementia in other diseases classified elsewhere, mild, with anxiety: Secondary | ICD-10-CM | POA: Diagnosis not present

## 2023-05-12 DIAGNOSIS — E039 Hypothyroidism, unspecified: Secondary | ICD-10-CM | POA: Diagnosis not present

## 2023-05-12 DIAGNOSIS — F411 Generalized anxiety disorder: Secondary | ICD-10-CM | POA: Diagnosis not present

## 2023-05-12 DIAGNOSIS — I10 Essential (primary) hypertension: Secondary | ICD-10-CM | POA: Diagnosis not present

## 2023-05-12 DIAGNOSIS — N390 Urinary tract infection, site not specified: Secondary | ICD-10-CM | POA: Diagnosis not present

## 2023-05-12 DIAGNOSIS — G309 Alzheimer's disease, unspecified: Secondary | ICD-10-CM | POA: Diagnosis not present

## 2023-05-12 DIAGNOSIS — G319 Degenerative disease of nervous system, unspecified: Secondary | ICD-10-CM | POA: Diagnosis not present

## 2023-05-25 ENCOUNTER — Ambulatory Visit: Payer: Medicare HMO

## 2023-05-25 VITALS — BP 122/82 | Ht 66.0 in | Wt 125.0 lb

## 2023-05-25 DIAGNOSIS — Z0000 Encounter for general adult medical examination without abnormal findings: Secondary | ICD-10-CM

## 2023-05-25 DIAGNOSIS — Z2821 Immunization not carried out because of patient refusal: Secondary | ICD-10-CM

## 2023-05-25 NOTE — Progress Notes (Signed)
 Because this visit was a virtual/telehealth visit,  certain criteria was not obtained, such a blood pressure, CBG if applicable, and timed get up and go. Any medications not marked as "taking" were not mentioned during the medication reconciliation part of the visit. Any vitals not documented were not able to be obtained due to this being a telehealth visit or patient was unable to self-report a recent blood pressure reading due to a lack of equipment at home via telehealth. Vitals that have been documented are verbally provided by the patient.   This visit was performed by a medical professional under my direct supervision. I was immediately available for consultation/collaboration. I have reviewed and agree with the Annual Wellness Visit documentation.  Subjective:   Marilyn Cook is a 78 y.o. who presents for a Medicare Wellness preventive visit.  As a reminder, Annual Wellness Visits don't include a physical exam, and some assessments may be limited, especially if this visit is performed virtually. We may recommend an in-person follow-up visit with your provider if needed.  Visit Complete: Virtual I connected with  Marilyn Cook on 05/25/23 by a audio enabled telemedicine application and verified that I am speaking with the correct person using two identifiers.  Patient Location: Home  Provider Location: Home Office  I discussed the limitations of evaluation and management by telemedicine. The patient expressed understanding and agreed to proceed.  Vital Signs: Because this visit was a virtual/telehealth visit, some criteria may be missing or patient reported. Any vitals not documented were not able to be obtained and vitals that have been documented are patient reported.  VideoDeclined- This patient declined Librarian, academic. Therefore the visit was completed with audio only.  Persons Participating in Visit: Patient.  AWV Questionnaire: No: Patient  Medicare AWV questionnaire was not completed prior to this visit.  Cardiac Risk Factors include: advanced age (>52men, >59 women)     Objective:     Today's Vitals   05/25/23 0904  BP: 122/82  Weight: 125 lb (56.7 kg)  Height: 5\' 6"  (1.676 m)   Body mass index is 20.18 kg/m.     05/25/2023    9:04 AM 03/23/2023    9:00 PM 03/23/2023    4:14 PM 03/22/2023    7:40 PM 03/09/2023    9:05 AM 09/05/2022    9:26 AM 09/05/2022    9:15 AM  Advanced Directives  Does Patient Have a Medical Advance Directive? Yes Yes Yes No Yes No No  Type of Estate agent of [place];Living will Living will;Healthcare Power of [employer] Power of [place];Living will  Healthcare Power of Attorney    Does patient want to make changes to medical advance directive? No - Patient declined No - Patient declined Yes (Inpatient - patient defers changing a medical advance directive and declines information at this time)  No - Patient declined    Copy of Healthcare Power of Attorney in Chart? Yes - validated most recent copy scanned in chart (See row information) No - copy requested No - copy requested  No - copy requested      Current Medications (verified) Outpatient Encounter Medications as of 05/25/2023  Medication Sig   atorvastatin  (LIPITOR) 40 MG tablet Take 1 tablet (40 mg total) by mouth daily.   Calcium  Carbonate-Vit D-Min (CALCIUM -VITAMIN D -MINERALS) 600-400 MG-UNIT CHEW Chew 1 tablet by mouth daily.   Cholecalciferol (VITAMIN D3) 25 MCG (1000 UT) CAPS Take 1 capsule (1,000 Units total) by mouth daily.  clopidogrel  (PLAVIX ) 75 MG tablet Take 1 tablet (75 mg total) by mouth daily.   co-enzyme Q-10 30 MG capsule Take 30 mg by mouth daily.   cyanocobalamin  (VITAMIN B12) 500 MCG tablet Take 1 tablet (500 mcg total) by mouth daily.   denosumab  (PROLIA ) 60 MG/ML SOSY injection Inject 60 mg into the skin every 6 (six) months.   divalproex  (DEPAKOTE ) 125 MG DR tablet TAKE 1 TABLET (125  MG TOTAL) BY MOUTH AT BEDTIME (Patient taking differently: Take 125 mg by mouth daily.)   levothyroxine  (SYNTHROID ) 25 MCG tablet Take 1 tablet (25 mcg total) by mouth daily before breakfast.   loratadine  (CLARITIN ) 10 MG tablet Take 10 mg by mouth daily.   Magnesium 250 MG CAPS Take 1 capsule by mouth at bedtime.   memantine  (NAMENDA ) 10 MG tablet TAKE 1 TABLET BY MOUTH TWICE A DAY   Pyridoxine  HCl (VITAMIN B-6 PO) Take 1 tablet by mouth daily.   rivastigmine  (EXELON ) 1.5 MG capsule Take 1 cap at night for 2 weeks, then increase to 1 cap twice daily   sertraline  (ZOLOFT ) 25 MG tablet Take 1 tablet (25 mg total) by mouth daily.   triamcinolone  (KENALOG ) 0.147 MG/GM topical spray Apply topically 2 (two) times daily as needed (psoriasis rash on scalp).   No facility-administered encounter medications on file as of 05/25/2023.    Allergies (verified) Aspirin, Diphenhydramine hcl, Donepezil , Egg-derived products, Epinephrine , Latex, Other, Penicillins, and Sulfasalazine   History: Past Medical History:  Diagnosis Date   Abdominal discomfort 10/08/2021   Abnormal urinalysis 02/24/2020   Acute cystitis 10/08/2021   Atherosclerosis of aorta 03/18/2020   By CT 03/2020 and MRI 04/2020   Benign tumor of breast 01/06/1973   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   ?hypothyroid - start levothyroxine  25mcg daily   Cerebrovascular disease    Moderate per MRI   Chronic right shoulder pain 10/14/2014   Complication of anesthesia    hard to wake up   Compression fracture of thoracic vertebra 02/25/2020   Incidental finding on CXR 02/2020 - T10 compression fracture    Diverticulosis of colon 10/27/2007   Generalized anxiety disorder 09/25/2021   Hiatal hernia 03/18/2020   Moderate sized with evidence esoph dysmotility or GERD on CT 03/2020   HLD (hyperlipidemia) 06/30/2006   New goal LDL <70 (1//2017)   Hyperlipemia    Lesion of right native kidney 03/18/2020   On CT 03/2020  MRI abd 04/2020 - R  exophytic cyst - benign complex Bosniak 2 cyst - no f/u needed    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   DEXA 03/2020 - spine -1.0, R hip neck -1.9 - however dx osteoporosis given h/o vertebral compression fracture on previous CXR, rec rpt 1 yr   Continue cal/vit D daily   First prolia  injection 06/25/2020  Latest prolia  injection 08/01/2021   Palpitations 03/28/2013   Pulsatile tinnitus of right ear 05/21/2021   Renal insufficiency 10/18/2019   Right-sided low back pain without sciatica 07/13/2021   Scalp psoriasis  TIA (transient ischemic attack) 06/08/2014   Unintentional weight loss 04/13/2014   Vitamin B12 deficiency 08/05/2021   Past Surgical History:  Procedure Laterality Date   ABDOMINAL HYSTERECTOMY     CATARACT EXTRACTION  11/2021   [facility]   COLONOSCOPY  04/07/2015   diverticulosis, int hem, rpt 10 yrs (Nandigam)   DEXA  08/06/2012   spine WNL, hip T -1.2   PARTIAL HYSTERECTOMY  01/06/1985   due to fibroids, ovaries remain   R Lat Epicondyle Injections  01/07/1996   SHOULDER ARTHROSCOPY Right 11/07/2014   arthroscopic debridement and mini open rotator cuff repair of R shoulder for chronic impingement and rotator cuff tear by MRI Murrel Arnt)   Spiral CT  04/06/2000   (-)   Family History  Problem Relation Age of Onset   Stroke Mother        CVA   Heart disease Father        MI    Stroke Father        CVA   Hypertension Sister    Cancer Sister        ovarian and colon cancer   Cancer Maternal Aunt        breast    Dementia Maternal Aunt    Cancer Paternal Aunt        ? 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  Tobacco Use   Smoking status: Never   Smokeless tobacco: Never  Vaping Use   Vaping status: Never Used  Substance and Sexual Activity   Alcohol use: No    Alcohol/week: 0.0 standard drinks of alcohol   Drug use: No   Sexual activity: Not Currently  Other Topics Concern   Not on file  Social History Narrative   Widow - husband Forestine Igo died from cardiac arrest 10-Feb-2019   One son   Occupation: retired from working at [employer] at [employer]   Activity: no regular exercise   Diet: some water, fruits/vegetables daily      Advanced directives: discussed - HCPOA unsure will consider this. Needs to set up.   Right handed    One story home    Social Drivers of Health   Financial Resource Strain: Low Risk  (05/25/2023)   Overall Financial Resource Strain (CARDIA)    Difficulty of Paying Living Expenses: Not hard at all  Food Insecurity: No Food Insecurity (05/25/2023)   Hunger Vital Sign    Worried About Running Out of Food in the Last Year: Never true    Ran Out of Food in the Last Year: Never true  Transportation Needs: No Transportation Needs (05/25/2023)   PRAPARE - Administrator, Civil Service (Medical): No    Lack of Transportation (Non-Medical): No  Physical Activity: Inactive (05/25/2023)   Exercise Vital Sign    Days of Exercise per Week: 0 days  Minutes of Exercise per Session: 0 min   Stress: No Stress Concern Present (05/25/2023)   [employer] of Occupational Health - Occupational Stress Questionnaire    Feeling of Stress : Not at all  Social Connections: Moderately Integrated (05/25/2023)   Social Connection and Isolation Panel [NHANES]    Frequency of Communication with Friends and Family: More than three times a week    Frequency of Social Gatherings with Friends and Family: More than three times a week    Attends Religious Services: More than 4 times per year    Active Member of [employer] or Organizations: Yes    Attends Banker Meetings: More than 4 times per year    Marital Status: Widowed    Tobacco Counseling Counseling given: Not Answered    Clinical Intake:  Pre-visit preparation completed: Yes  Pain : No/denies pain     BMI - recorded: 20.18 Nutritional Status: BMI of 19-24  Normal Nutritional Risks: None Diabetes: No  Lab Results  Component Value Date   HGBA1C 5.6 01/10/2015   HGBA1C 5.9 (H) 07/06/2014   HGBA1C 5.8 08/01/2013     How often do you need to have someone help you when you read instructions, pamphlets, or other written materials from your doctor or pharmacy?: 1 - Never  Interpreter Needed?: No  Information entered by :: Juliann Ochoa   Activities of Daily Living     05/25/2023    9:13 AM 03/23/2023    9:00 PM  In your present state of health, do you have any difficulty performing the following activities:  Hearing? 0 0  Vision? 0 0  Difficulty concentrating or making decisions? 0 1  Walking or climbing stairs? 0   Dressing or bathing? 0   Doing errands, shopping? 1 0  Preparing Food and eating ? Y   Comment patient has a aid   Using the Toilet? N   In the past six months, have you accidently leaked urine? N   Do you have problems with loss of bowel control? N   Managing your Medications? Y   Comment patient has a aid and family   Managing your Finances? Y   Housekeeping or managing your  Housekeeping? Y   Comment patient has a aid     Patient Care Team: Claire Crick, MD as PCP - General (Family Medicine)  Indicate any recent Medical Services you may have received from other than [facility] providers in the past year (date may be approximate).     Assessment:    This is a routine wellness examination for [facility].  Hearing/Vision screen Hearing Screening - Comments:: No hearing difficulties Vision Screening - Comments:: No vision issues    Goals Addressed             This Visit's Progress    Patient Stated       Patient states would like to travel        Depression Screen     05/25/2023    9:29 AM 02/23/2023    9:26 AM 05/12/2022   11:10 AM 07/12/2021    2:54 PM 05/08/2021   11:25 AM 02/15/2021    2:39 PM 02/17/2020    1:38 PM  PHQ 2/9 Scores  PHQ - 2 Score 0  0 1 1 0 0  PHQ- 9 Score 4   2   0  Exception Documentation  Medical reason         Fall Risk     05/25/2023  9:12 AM 03/09/2023    9:05 AM 09/05/2022    9:25 AM 09/05/2022    9:15 AM 05/12/2022   11:10 AM  Fall Risk   Falls in the past year? 1 0 0 0 0  Number falls in past yr: 0 0 0 0 0  Injury with Fall? 0 0 0 0 0  Risk for fall due to : History of fall(s)    No Fall Risks  Follow up Falls evaluation completed;Falls prevention discussed Falls evaluation completed Falls evaluation completed Falls evaluation completed Falls prevention discussed;Falls evaluation completed;Education provided    MEDICARE RISK AT HOME:  Medicare Risk at Home Any stairs in or around the home?: Yes If so, are there any without handrails?: No Home free of loose throw rugs in walkways, pet beds, electrical cords, etc?: Yes Adequate lighting in your home to reduce risk of falls?: Yes Life alert?: No Use of a cane, walker or w/c?: No Grab bars in the bathroom?: Yes Shower chair or bench in shower?: Yes Elevated toilet seat or a handicapped toilet?: Yes  TIMED UP AND GO:  Was the test performed?  No  Cognitive  Function: 6CIT completed    02/17/2020    1:48 PM 02/14/2019    3:01 PM  MMSE - Mini Mental State Exam  Not completed: Unable to complete   Orientation to time  5  Orientation to Place  5  Registration  3  Attention/ Calculation  5  Recall  3  Language- repeat  1      08/02/2021   12:00 PM  Montreal Cognitive Assessment   Visuospatial/ Executive (0/5) 2  Naming (0/3) 2  Attention: Read list of digits (0/2) 2  Attention: Read list of letters (0/1) 1  Attention: Serial 7 subtraction starting at 100 (0/3) 3  Language: Repeat phrase (0/2) 1  Language : Fluency (0/1) 0  Abstraction (0/2) 0  Delayed Recall (0/5) 0  Orientation (0/6) 4  Total 15  Adjusted Score (based on education) 15      05/25/2023    9:09 AM 05/12/2022   11:12 AM 05/08/2021   11:38 AM  6CIT Screen  What Year? 4 points 0 points 0 points  What month? 3 points 3 points 0 points  What time? 3 points 0 points 0 points  Count back from 20 4 points 0 points 0 points  Months in reverse 4 points 4 points 0 points  Repeat phrase 10 points 10 points 4 points  Total Score 28 points 17 points 4 points    Immunizations Immunization History  Administered Date(s) Administered   Fluad Quad(high Dose 65+) 10/07/2018, 09/13/2019, 09/25/2021   Influenza Inj Mdck Quad Pf 10/04/2017   Influenza, High Dose Seasonal PF 11/06/2020   Influenza,inj,Quad PF,6+ Mos 10/08/2015   Influenza-Unspecified 09/07/2014, 10/04/2016   PFIZER(Purple Top)SARS-COV-2 Vaccination 01/25/2019, 02/12/2019   Pneumococcal Conjugate-13 01/23/2015   Pneumococcal Polysaccharide-23 07/31/2011   Td 10/12/2000   Tdap 07/31/2011   Zoster Recombinant(Shingrix) 07/21/2017, 11/07/2017   Zoster, Live 11/23/2015    Screening Tests Health Maintenance  Topic Date Due   DTaP/Tdap/Td (3 - Td or Tdap) 07/30/2021   COVID-19 Vaccine (3 - 2024-25 season) 09/07/2022   MAMMOGRAM  06/13/2023   INFLUENZA VACCINE  08/07/2023   Medicare Annual Wellness (AWV)   05/24/2024   Pneumonia Vaccine 22+ Years old  Completed   DEXA SCAN  Completed   Hepatitis C Screening  Completed   Zoster Vaccines- Shingrix  Completed   HPV VACCINES  Aged Out   Meningococcal B Vaccine  Aged Out   Colonoscopy  Discontinued    Health Maintenance  Health Maintenance Due  Topic Date Due   DTaP/Tdap/Td (3 - Td or Tdap) 07/30/2021   COVID-19 Vaccine (3 - 2024-25 season) 09/07/2022   Health Maintenance Items Addressed:patient declined vaccines   Additional Screening:  Vision Screening: Recommended annual ophthalmology exams for early detection of glaucoma and other disorders of the eye.  Dental Screening: Recommended annual dental exams for proper oral hygiene  Community Resource Referral / Chronic Care Management: CRR required this visit?  No   CCM required this visit?  No   Plan:    I have personally reviewed and noted the following in the patient's chart:   Medical and social history Use of alcohol, tobacco or illicit drugs  Current medications and supplements including opioid prescriptions. Patient is not currently taking opioid prescriptions. Functional ability and status Nutritional status Physical activity Advanced directives List of other physicians Hospitalizations, surgeries, and ER visits in previous 12 months Vitals Screenings to include cognitive, depression, and falls Referrals and appointments  In addition, I have reviewed and discussed with patient certain preventive protocols, quality metrics, and best practice recommendations. A written personalized care plan for preventive services as well as general preventive health recommendations were provided to patient.   Freeda Jerry, [place]   05/25/2023   After Visit Summary: (MyChart) Due to this being a telephonic visit, the after visit summary with patients personalized plan was offered to patient via MyChart   Notes: Nothing significant to report at this time.

## 2023-05-25 NOTE — Patient Instructions (Signed)
 Marilyn Cook , Thank you for taking time out of your busy schedule to complete your Annual Wellness Visit with me. I enjoyed our conversation and look forward to speaking with you again next year. I, as well as your care team,  appreciate your ongoing commitment to your health goals. Please review the following plan we discussed and let me know if I can assist you in the future. Your Game plan/ To Do List    Referrals: If you haven't heard from the office you've been referred to, please reach out to them at the phone provided.   Follow up Visits: Next Medicare AWV with our clinical staff: 05/26/2024   Have you seen your provider in the last 6 months (3 months if uncontrolled diabetes)? Yes Next Office Visit with your provider: 08/17/2023  Clinician Recommendations:  Aim for 30 minutes of exercise or brisk walking, 6-8 glasses of water, and 5 servings of fruits and vegetables each day.       This is a list of the screening recommended for you and due dates:  Health Maintenance  Topic Date Due   DTaP/Tdap/Td vaccine (3 - Td or Tdap) 07/30/2021   COVID-19 Vaccine (3 - 2024-25 season) 09/07/2022   Mammogram  06/13/2023   Flu Shot  08/07/2023   Medicare Annual Wellness Visit  05/24/2024   Pneumonia Vaccine  Completed   DEXA scan (bone density measurement)  Completed   Hepatitis C Screening  Completed   Zoster (Shingles) Vaccine  Completed   HPV Vaccine  Aged Out   Meningitis B Vaccine  Aged Out   Colon Cancer Screening  Discontinued    Advanced directives: (Declined) Advance directive discussed with you today. Even though you declined this today, please call our office should you change your mind, and we can give you the proper paperwork for you to fill out. Advance Care Planning is important because it:  [x]  Makes sure you receive the medical care that is consistent with your values, goals, and preferences  [x]  It provides guidance to your family and loved ones and reduces their  decisional burden about whether or not they are making the right decisions based on your wishes.  Follow the link provided in your after visit summary or read over the paperwork we have mailed to you to help you started getting your Advance Directives in place. If you need assistance in completing these, please reach out to us  so that we can help you!  See attachments for Preventive Care and Fall Prevention Tips.

## 2023-06-04 ENCOUNTER — Ambulatory Visit: Payer: Self-pay | Admitting: Internal Medicine

## 2023-06-04 ENCOUNTER — Ambulatory Visit (INDEPENDENT_AMBULATORY_CARE_PROVIDER_SITE_OTHER): Admitting: Internal Medicine

## 2023-06-04 ENCOUNTER — Encounter: Payer: Self-pay | Admitting: Internal Medicine

## 2023-06-04 VITALS — BP 128/82 | HR 72 | Temp 98.6°F | Ht 66.0 in | Wt 125.0 lb

## 2023-06-04 DIAGNOSIS — F02A18 Dementia in other diseases classified elsewhere, mild, with other behavioral disturbance: Secondary | ICD-10-CM | POA: Diagnosis not present

## 2023-06-04 DIAGNOSIS — G301 Alzheimer's disease with late onset: Secondary | ICD-10-CM | POA: Diagnosis not present

## 2023-06-04 DIAGNOSIS — B001 Herpesviral vesicular dermatitis: Secondary | ICD-10-CM | POA: Diagnosis not present

## 2023-06-04 DIAGNOSIS — R4182 Altered mental status, unspecified: Secondary | ICD-10-CM | POA: Diagnosis not present

## 2023-06-04 LAB — POC URINALSYSI DIPSTICK (AUTOMATED)
Bilirubin, UA: NEGATIVE
Glucose, UA: NEGATIVE
Ketones, UA: NEGATIVE
Leukocytes, UA: NEGATIVE
Nitrite, UA: NEGATIVE
Protein, UA: POSITIVE — AB
Spec Grav, UA: 1.015 (ref 1.010–1.025)
Urobilinogen, UA: 0.2 U/dL
pH, UA: 6 (ref 5.0–8.0)

## 2023-06-04 NOTE — Assessment & Plan Note (Signed)
 Mostly yesterday Might have slept poorly the night before Has apparent cold sore with some pain--that could be related No urine symptoms and urinalysis negative for nitrite and leuks--no UTI Will check labs Is better today

## 2023-06-04 NOTE — Progress Notes (Signed)
 Subjective:    Patient ID: Marilyn Cook, female    DOB: January 10, 1945, 78 y.o.   MRN: [id number]  HPI Here with son for change in mental status  Apparently fell and hit head--trouble remembering what happened Son noticed a new spot on her forehead--and some disarray in her bathroom (he thinks she may have reached down for something on the floor and then hit her head on the sink) Son noted some bad confusion yesterday--trouble with recall (like that grandson graduating fire academy--forgot he was even in the academy).  Trouble with what day it is Visual hallucination last night--saw little boy in her room. Has seen woman and little girl in the past. Thinks woman is out in the trees  Sleeps okay No dysuria or hematuria No fever  Is better today  Current Outpatient Medications on File Prior to Visit  Medication Sig Dispense Refill   atorvastatin  (LIPITOR) 40 MG tablet Take 1 tablet (40 mg total) by mouth daily. 90 tablet 4   Calcium  Carbonate-Vit D-Min (CALCIUM -VITAMIN D -MINERALS) 600-400 MG-UNIT CHEW Chew 1 tablet by mouth daily.     Cholecalciferol (VITAMIN D3) 25 MCG (1000 UT) CAPS Take 1 capsule (1,000 Units total) by mouth daily.     clopidogrel  (PLAVIX ) 75 MG tablet Take 1 tablet (75 mg total) by mouth daily. 90 tablet 4   co-enzyme Q-10 30 MG capsule Take 30 mg by mouth daily.     cyanocobalamin  (VITAMIN B12) 500 MCG tablet Take 1 tablet (500 mcg total) by mouth daily.     denosumab  (PROLIA ) 60 MG/ML SOSY injection Inject 60 mg into the skin every 6 (six) months. 1 mL 1   divalproex  (DEPAKOTE ) 125 MG DR tablet TAKE 1 TABLET (125 MG TOTAL) BY MOUTH AT BEDTIME (Patient taking differently: Take 125 mg by mouth daily.) 90 tablet 1   levothyroxine  (SYNTHROID ) 25 MCG tablet Take 1 tablet (25 mcg total) by mouth daily before breakfast. 90 tablet 4   loratadine  (CLARITIN ) 10 MG tablet Take 10 mg by mouth daily.     Magnesium 250 MG CAPS Take 1 capsule by mouth at bedtime.     memantine   (NAMENDA ) 10 MG tablet TAKE 1 TABLET BY MOUTH TWICE A DAY 180 tablet 1   Pyridoxine  HCl (VITAMIN B-6 PO) Take 1 tablet by mouth daily.     rivastigmine  (EXELON ) 1.5 MG capsule Take 1 cap at night for 2 weeks, then increase to 1 cap twice daily 60 capsule 11   sertraline  (ZOLOFT ) 25 MG tablet Take 1 tablet (25 mg total) by mouth daily. 90 tablet 4   triamcinolone  (KENALOG ) 0.147 MG/GM topical spray Apply topically 2 (two) times daily as needed (psoriasis rash on scalp). 63 g 3   No current facility-administered medications on file prior to visit.    Allergies  Allergen Reactions   Aspirin     Unknown reaction   Diphenhydramine Hcl    Donepezil  Other (See Comments)    GI upset   Egg-Derived Products Hives and Swelling   Epinephrine      REACTION: UNSPECIFIED   Latex     REACTION: swelling   Other Other (See Comments)    GI upset, headache to DAIRY PRODUCTS   Penicillins    Sulfasalazine     Past Medical History:  Diagnosis Date   Abdominal discomfort 10/08/2021   Abnormal urinalysis 02/24/2020   Acute cystitis 10/08/2021   Atherosclerosis of aorta 03/18/2020   By CT 03/2020 and MRI 04/2020   Benign tumor  of breast 01/06/1973   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   ?hypothyroid - start levothyroxine  25mcg daily   Cerebrovascular disease    Moderate per MRI   Chronic right shoulder pain 10/14/2014   Complication of anesthesia    hard to wake up   Compression fracture of thoracic vertebra 02/25/2020   Incidental finding on CXR 02/2020 - T10 compression fracture    Diverticulosis of colon 10/27/2007   Generalized anxiety disorder 09/25/2021   Hiatal hernia 03/18/2020   Moderate sized with evidence esoph dysmotility or GERD on CT 03/2020   HLD (hyperlipidemia) 06/30/2006   New goal LDL <70 (1//2017)   Hyperlipemia    Lesion of right native kidney 03/18/2020   On CT 03/2020  MRI abd 04/2020 - R exophytic cyst - benign complex Bosniak 2 cyst - no f/u needed    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   DEXA 03/2020 - spine -1.0, R hip neck -1.9 - however dx osteoporosis given h/o vertebral compression fracture on previous CXR, rec rpt 1 yr   Continue cal/vit D daily   First prolia  injection 06/25/2020  Latest prolia  injection 08/01/2021   Palpitations 03/28/2013   Pulsatile tinnitus of right ear 05/21/2021   Renal insufficiency 10/18/2019   Right-sided low back pain without sciatica 07/13/2021   Scalp psoriasis    TIA (transient ischemic attack) 06/08/2014   Unintentional weight loss 04/13/2014   Vitamin B12 deficiency 08/05/2021    Past Surgical History:  Procedure Laterality Date   ABDOMINAL HYSTERECTOMY     CATARACT EXTRACTION  11/2021   [facility]   COLONOSCOPY  04/07/2015   diverticulosis, int hem, rpt 10 yrs (Nandigam)   DEXA  08/06/2012   spine WNL, hip T -1.2   PARTIAL HYSTERECTOMY  01/06/1985   due to fibroids, ovaries remain   R Lat Epicondyle Injections  01/07/1996   SHOULDER ARTHROSCOPY Right 11/07/2014   arthroscopic debridement and mini open rotator cuff repair of R shoulder for chronic impingement and rotator cuff tear by MRI Murrel Arnt)   Spiral CT  04/06/2000   (-)    Family History  Problem Relation Age of Onset   Stroke Mother        CVA   Heart disease Father        MI    Stroke Father        CVA   Hypertension Sister    Cancer Sister        ovarian and colon cancer   Cancer Maternal Aunt        breast   Dementia Maternal Aunt    Cancer Paternal Aunt        ? colon   Cancer  Paternal Grandfather        liver, alcohol   Alcohol abuse Paternal Grandfather    Aneurysm Brother    Hypertension Brother    Hypertension Brother    Cancer Other        prostate   Breast cancer Neg Hx  Social History   Socioeconomic History   Marital status: Widowed    Spouse name: Not on file   Number of children: 1   Years of education: 12   Highest education level: High school graduate  Occupational History   Occupation: Retired    Associate Professor: [employer]    Comment: administrative roles  Tobacco Use   Smoking status: Never   Smokeless tobacco: Never  Vaping Use   Vaping status: Never Used  Substance and Sexual Activity   Alcohol use: No    Alcohol/week: 0.0 standard drinks of alcohol   Drug use: No   Sexual activity: Not Currently  Other Topics Concern   Not on file  Social History Narrative   Widow - husband Forestine Igo died from cardiac arrest 02/24/2019   One son   Occupation: retired from working at [employer] at [employer]   Activity: no regular exercise   Diet: some water, fruits/vegetables daily      Advanced directives: discussed - HCPOA unsure will consider this. Needs to set up.   Right handed    One story home    Social Drivers of Health   Financial Resource Strain: Low Risk  (05/25/2023)   Overall Financial Resource Strain (CARDIA)    Difficulty of Paying Living Expenses: Not hard at all  Food Insecurity: No Food Insecurity (05/25/2023)   Hunger Vital Sign    Worried About Running Out of Food in the Last Year: Never true    Ran Out of Food in the Last Year: Never true  Transportation Needs: No Transportation Needs (05/25/2023)   PRAPARE - Administrator, Civil Service (Medical): No    Lack of Transportation (Non-Medical): No  Physical Activity: Inactive (05/25/2023)   Exercise Vital Sign    Days of Exercise per Week: 0 days    Minutes of Exercise per Session: 0 min  Stress: No Stress Concern Present (05/25/2023)   [employer] of  Occupational Health - Occupational Stress Questionnaire    Feeling of Stress : Not at all  Social Connections: Moderately Integrated (05/25/2023)   Social Connection and Isolation Panel [NHANES]    Frequency of Communication with Friends and Family: More than three times a week    Frequency of Social Gatherings with Friends and Family: More than three times a week    Attends Religious Services: More than 4 times per year    Active Member of [employer] or Organizations: Yes    Attends Banker Meetings: More than 4 times per year    Marital Status: Widowed  Intimate Partner Violence: Not At Risk (05/25/2023)   Humiliation, Afraid, Rape, and Kick questionnaire    Fear of Current or Ex-Partner: No    Emotionally Abused: No    Physically Abused: No    Sexually Abused: No   Review of Systems No N/V Has spot below left lip in past 2 days---did have cold sores many years ago Did have some recent dental work last week--seemed to have recovered okay    Objective:   Physical Exam HENT:     Mouth/Throat:     Comments: 2 crusted lesions on left lower lip Cardiovascular:     Rate and Rhythm: Normal rate and regular rhythm.     Heart sounds: No murmur heard.    No gallop.  Pulmonary:     Effort: Pulmonary effort is normal.     Breath sounds: Normal breath sounds. No wheezing or rales.  Abdominal:  Palpations: Abdomen is soft.     Tenderness: There is no abdominal tenderness.  Musculoskeletal:     Cervical back: Neck supple.     Right lower leg: No edema.     Left lower leg: No edema.  Lymphadenopathy:     Cervical: No cervical adenopathy.  Skin:    Comments: Small contusion on left forehead--minimal tenderness  Neurological:     General: No focal deficit present.     Comments: "March 25" Not sure of year Couldn't do backwards months Did do backwards days--with slight prompting            Assessment & Plan:

## 2023-06-04 NOTE — Assessment & Plan Note (Addendum)
 No sig change in functional status Som lives next door and he has video surveillance He did stay last night--and she slept well Some visual hallucinations that are not new for her On memantine , exelon 

## 2023-06-04 NOTE — Addendum Note (Signed)
 Addended by: Franne Ivory on: 06/04/2023 03:29 PM   Modules accepted: Orders

## 2023-06-04 NOTE — Assessment & Plan Note (Signed)
 Crusted now--but still some pain If recurs, would give valacyclovir to give at onset the next time

## 2023-06-05 LAB — CBC
HCT: 37.5 % (ref 36.0–46.0)
Hemoglobin: 12.7 g/dL (ref 12.0–15.0)
MCHC: 34 g/dL (ref 30.0–36.0)
MCV: 95.8 fl (ref 78.0–100.0)
Platelets: 216 10*3/uL (ref 150.0–400.0)
RBC: 3.91 Mil/uL (ref 3.87–5.11)
RDW: 14 % (ref 11.5–15.5)
WBC: 5.1 10*3/uL (ref 4.0–10.5)

## 2023-06-05 LAB — COMPREHENSIVE METABOLIC PANEL WITH GFR
ALT: 17 U/L (ref 0–35)
AST: 26 U/L (ref 0–37)
Albumin: 4 g/dL (ref 3.5–5.2)
Alkaline Phosphatase: 55 U/L (ref 39–117)
BUN: 7 mg/dL (ref 6–23)
CO2: 32 meq/L (ref 19–32)
Calcium: 9.1 mg/dL (ref 8.4–10.5)
Chloride: 103 meq/L (ref 96–112)
Creatinine, Ser: 0.82 mg/dL (ref 0.40–1.20)
GFR: 68.39 mL/min (ref >60.00)
Glucose, Bld: 70 mg/dL (ref 70–99)
Potassium: 3.8 meq/L (ref 3.5–5.1)
Sodium: 143 meq/L (ref 135–145)
Total Bilirubin: 0.4 mg/dL (ref 0.2–1.2)
Total Protein: 6.4 g/dL (ref 6.0–8.3)

## 2023-06-05 LAB — TSH: TSH: 4.34 u[IU]/mL (ref 0.35–5.50)

## 2023-06-05 LAB — T4, FREE: Free T4: 0.78 ng/dL (ref 0.60–1.60)

## 2023-07-17 ENCOUNTER — Ambulatory Visit: Admitting: Physician Assistant

## 2023-07-17 ENCOUNTER — Encounter: Payer: Self-pay | Admitting: Physician Assistant

## 2023-07-17 VITALS — BP 114/59 | HR 79 | Resp 20 | Wt 125.0 lb

## 2023-07-17 DIAGNOSIS — G309 Alzheimer's disease, unspecified: Secondary | ICD-10-CM

## 2023-07-17 DIAGNOSIS — F0280 Dementia in other diseases classified elsewhere without behavioral disturbance: Secondary | ICD-10-CM | POA: Diagnosis not present

## 2023-07-17 MED ORDER — RIVASTIGMINE TARTRATE 3 MG PO CAPS: ORAL_CAPSULE | ORAL | 11 refills | Status: AC

## 2023-07-17 NOTE — Patient Instructions (Addendum)
 It was a pleasure to see you today at our office.   Recommendations:  Follow up in 6  months Continue Memantine   but  10 mg twice daily.Side effects were discussed  Continue  Depakote  125 mg twice a day for hallucinations  Increase rivastigmine   3 milligrams  twice daily if tolerated.  Recommend [facility] day program    Visit [facility]      RECOMMENDATIONS FOR ALL PATIENTS WITH MEMORY PROBLEMS: 1. Continue to exercise (Recommend 30 minutes of walking everyday, or 3 hours every week) 2. Increase social interactions - continue going to [place] and enjoy social gatherings with friends and family 3. Eat healthy, avoid fried foods and eat more fruits and vegetables 4. Maintain adequate blood pressure, blood sugar, and blood cholesterol level. Reducing the risk of stroke and cardiovascular disease also helps promoting better memory. 5. Avoid stressful situations. Live a simple life and avoid aggravations. Organize your time and prepare for the next day in anticipation. 6. Sleep well, avoid any interruptions of sleep and avoid any distractions in the bedroom that may interfere with adequate sleep quality 7. Avoid sugar, avoid sweets as there is a strong link between excessive sugar intake, diabetes, and cognitive impairment We discussed the Mediterranean diet, which has been shown to help patients reduce the risk of progressive memory disorders and reduces cardiovascular risk. This includes eating fish, eat fruits and green leafy vegetables, nuts like almonds and hazelnuts, walnuts, and also use olive oil. Avoid fast foods and fried foods as much as possible. Avoid sweets and sugar as sugar use has been linked to worsening of memory function.  There is always a concern of gradual progression of memory problems. If this is the case, then we may need to adjust level of care according to patient needs. Support, both to the patient and caregiver, should then be put into place.    The  Alzheimer's Association is here all day, every day for people facing Alzheimer's disease through our free 24/7 Helpline: [phone]. The Helpline provides reliable information and support to all those who need assistance, such as individuals living with memory loss, Alzheimer's or other dementia, caregivers, health care professionals and the public.  Our highly trained and knowledgeable staff can help you with: Understanding memory loss, dementia and Alzheimer's  Medications and other treatment options  General information about aging and brain health  Skills to provide quality care and to find the best care from professionals  Legal, financial and living-arrangement decisions Our Helpline also features: Confidential care consultation provided by master's level clinicians who can help with decision-making support, crisis assistance and education on issues families face every day  Help in a caller's preferred language using our translation service that features more than 200 languages and dialects  Referrals to local community programs, services and ongoing support     FALL PRECAUTIONS: Be cautious when walking. Scan the area for obstacles that may increase the risk of trips and falls. When getting up in the mornings, sit up at the edge of the bed for a few minutes before getting out of bed. Consider elevating the bed at the head end to avoid drop of blood pressure when getting up. Walk always in a well-lit room (use night lights in the walls). Avoid area rugs or power cords from appliances in the middle of the walkways. Use a walker or a cane if necessary and consider physical therapy for balance exercise. Get your eyesight checked regularly.  FINANCIAL OVERSIGHT: Supervision, especially  oversight when making financial decisions or transactions is also recommended.  HOME SAFETY: Consider the safety of the kitchen when operating appliances like stoves, microwave oven, and blender. Consider having  supervision and share cooking responsibilities until no longer able to participate in those. Accidents with firearms and other hazards in the house should be identified and addressed as well.   ABILITY TO BE LEFT ALONE: If patient is unable to contact 911 operator, consider using LifeLine, or when the need is there, arrange for someone to stay with patients. Smoking is a fire hazard, consider supervision or cessation. Risk of wandering should be assessed by caregiver and if detected at any point, supervision and safe proof recommendations should be instituted.  MEDICATION SUPERVISION: Inability to self-administer medication needs to be constantly addressed. Implement a mechanism to ensure safe administration of the medications.   DRIVING: Regarding driving, in patients with progressive memory problems, driving will be impaired. We advise to have someone else do the driving if trouble finding directions or if minor accidents are reported. Independent driving assessment is available to determine safety of driving.   If you are interested in the driving assessment, you can contact the following:  [facility] in [place] [phone]  [facility] [phone]  [facility] [phone] or [phone]      Mediterranean Diet A Mediterranean diet refers to food and lifestyle choices that are based on the traditions of countries located on the [employer]. This way of eating has been shown to help prevent certain conditions and improve outcomes for people who have chronic diseases, like kidney disease and heart disease. What are tips for following this plan? Lifestyle  Cook and eat meals together with your family, when possible. Drink enough fluid to keep your urine clear or pale yellow. Be physically active every day. This includes: Aerobic exercise like running or swimming. Leisure activities like gardening,  walking, or housework. Get 7-8 hours of sleep each night. If recommended by your health care provider, drink red wine in moderation. This means 1 glass a day for nonpregnant women and 2 glasses a day for men. A glass of wine equals 5 oz (150 mL). Reading food labels  Check the serving size of packaged foods. For foods such as rice and pasta, the serving size refers to the amount of cooked product, not dry. Check the total fat in packaged foods. Avoid foods that have saturated fat or trans fats. Check the ingredients list for added sugars, such as corn syrup. Shopping  At the grocery store, buy most of your food from the areas near the walls of the store. This includes: Fresh fruits and vegetables (produce). Grains, beans, nuts, and seeds. Some of these may be available in unpackaged forms or large amounts (in bulk). Fresh seafood. Poultry and eggs. Low-fat dairy products. Buy whole ingredients instead of prepackaged foods. Buy fresh fruits and vegetables in-season from local farmers markets. Buy frozen fruits and vegetables in resealable bags. If you do not have access to quality fresh seafood, buy precooked frozen shrimp or canned fish, such as tuna, salmon, or sardines. Buy small amounts of raw or cooked vegetables, salads, or olives from the deli or salad bar at your store. Stock your pantry so you always have certain foods on hand, such as olive oil, canned tuna, canned tomatoes, rice, pasta, and beans. Cooking  Cook foods with extra-virgin olive oil instead of using butter or other vegetable oils. Have meat as a  side dish, and have vegetables or grains as your main dish. This means having meat in small portions or adding small amounts of meat to foods like pasta or stew. Use beans or vegetables instead of meat in common dishes like chili or lasagna. Experiment with different cooking methods. Try roasting or broiling vegetables instead of steaming or sauteing them. Add frozen vegetables  to soups, stews, pasta, or rice. Add nuts or seeds for added healthy fat at each meal. You can add these to yogurt, salads, or vegetable dishes. Marinate fish or vegetables using olive oil, lemon juice, garlic, and fresh herbs. Meal planning  Plan to eat 1 vegetarian meal one day each week. Try to work up to 2 vegetarian meals, if possible. Eat seafood 2 or more times a week. Have healthy snacks readily available, such as: Vegetable sticks with hummus. Greek yogurt. Fruit and nut trail mix. Eat balanced meals throughout the week. This includes: Fruit: 2-3 servings a day Vegetables: 4-5 servings a day Low-fat dairy: 2 servings a day Fish, poultry, or lean meat: 1 serving a day Beans and legumes: 2 or more servings a week Nuts and seeds: 1-2 servings a day Whole grains: 6-8 servings a day Extra-virgin olive oil: 3-4 servings a day Limit red meat and sweets to only a few servings a month What are my food choices? Mediterranean diet Recommended Grains: Whole-grain pasta. Brown rice. Bulgar wheat. Polenta. Couscous. Whole-wheat bread. [place] Madeira. Vegetables: Artichokes. Beets. Broccoli. Cabbage. Carrots. Eggplant. Green beans. Chard. Kale. Spinach. Onions. Leeks. Peas. Squash. Tomatoes. Peppers. Radishes. Fruits: Apples. Apricots. Avocado. Berries. Bananas. Cherries. Dates. Figs. Grapes. Lemons. Melon. Oranges. Peaches. Plums. Pomegranate. Meats and other protein foods: Beans. Almonds. Sunflower seeds. Pine nuts. Peanuts. Cod. Salmon. Scallops. Shrimp. Tuna. Tilapia. Clams. Oysters. Eggs. Dairy: Low-fat milk. Cheese. Greek yogurt. Beverages: Water. Red wine. Herbal tea. Fats and oils: Extra virgin olive oil. Avocado oil. Grape seed oil. Sweets and desserts: [place] yogurt with honey. Baked apples. Poached pears. Trail mix. Seasoning and other foods: Basil. Cilantro. Coriander. Cumin. Mint. Parsley. Sage. Rosemary. Tarragon. Garlic. Oregano. Thyme. Pepper. Balsalmic vinegar. Tahini.  Hummus. Tomato sauce. Olives. Mushrooms. Limit these Grains: Prepackaged pasta or rice dishes. Prepackaged cereal with added sugar. Vegetables: Deep fried potatoes (french fries). Fruits: Fruit canned in syrup. Meats and other protein foods: Beef. Pork. Lamb. Poultry with skin. Hot dogs. [place]. Dairy: Ice cream. Sour cream. Whole milk. Beverages: Juice. Sugar-sweetened soft drinks. Beer. Liquor and spirits. Fats and oils: Butter. Canola oil. Vegetable oil. Beef fat (tallow). Lard. Sweets and desserts: Cookies. Cakes. Pies. Candy. Seasoning and other foods: Mayonnaise. Premade sauces and marinades. The items listed may not be a complete list. Talk with your dietitian about what dietary choices are right for you. Summary The Mediterranean diet includes both food and lifestyle choices. Eat a variety of fresh fruits and vegetables, beans, nuts, seeds, and whole grains. Limit the amount of red meat and sweets that you eat. Talk with your health care provider about whether it is safe for you to drink red wine in moderation. This means 1 glass a day for nonpregnant women and 2 glasses a day for men. A glass of wine equals 5 oz (150 mL). This information is not intended to replace advice given to you by your health care provider. Make sure you discuss any questions you have with your health care provider. Document Released: 08/16/2015 Document Revised: 09/18/2015 Document Reviewed: 08/16/2015 Elsevier Interactive Patient Education  2017 [employer].

## 2023-07-17 NOTE — Progress Notes (Signed)
 Assessment/Plan:   Dementia due to Alzheimer's disease with behavioral disturbance  Marilyn Cook is a very pleasant 78 y.o. RH female with a history of hypertension, hyperlipidemia, TIA 2017 and a diagnosis of dementia due to Alzheimer's disease as per neuropsychological evaluation on 12/19/2021 seen today in follow up for memory loss. Patient is currently on memantine  10 mg twice daily and rivastigmine  1.5 mg twice daily, tolerating well. Cognitive decline is noted. Discussed increasing the rivastigmine  dose to 3 mg bid if tolerated, son agrees.  For visual hallucinations and sundowning and she is on Depakote  125 mg twice daily.  Needs more assistance with ADLs.   Follow up in  6 months. Continue memantine  10 mg twice daily Continue rivastigmine , increase to 3 mg twice daily, side effects discussed Continue Depakote  125 mg twice daily for hallucinations, side effects discussed. Recommend good control of her cardiovascular risk factors Continue other mood medications as per PCP, she is on sertraline  and clonazepam    Subjective:    This patient is accompanied in the office by her son who supplements the history.  Previous records as well as any outside records available were reviewed prior to todays visit. Patient was last seen on 03/09/2023   Any changes in memory since last visit?  Worse over the last 3 weeks, she cannot remember her date of birth-son says.  She has little insight into her condition.  Long-term memory is fair. repeats oneself?  Endorsed, frequently Disoriented when walking into a room? She did it one time since last seen-son says. It looks like home but does not look like it   Leaving objects?  May misplace som things. She may hold some objects, such as vitamins, straws, plastic forks and knives. She jammed her purse on her laundry basket this morning.   Wandering behavior?  denies   Any personality changes since last visit?  Denies.  Pulls the hair in an area  of her scalp she has a bold spot-son says Any worsening depression?:  Denies.   Hallucinations or paranoia?  With Depakote , her hallucinations are better controlled.  Sometimes she sees a man in the window, and a woman with her baby sitting at the bed post, the lady in the tree. Seizures? Denies.    Any sleep changes?  She sleeps well.  Reports vivid dreams, may talk in her sleep, denies REM behavior . One time she was found  in the closet sleeping. She continues to sleep with her clothes on. Sleep apnea?   Denies.   Any hygiene concerns?  Needs reminder to shower. Independent of bathing and dressing?  Endorsed  Does the patient needs help with medications?  Son is in charge   Who is in charge of the finances?  Son is in charge     Any changes in appetite?  Denies. She eats like a bird or a bear-he says   Patient have trouble swallowing? Denies.   Does the patient cook? No Any headaches?   Denies.   Any vision changes? Denies Chronic pain?  Denies . Chronic knee pain  Ambulates with difficulty? Denies.    Recent falls or head injuries? Denies.     Unilateral weakness, numbness or tingling? Denies.   Any tremors?  Denies   Any anosmia?  Denies   Any incontinence of urine?  Endorsed. She has recurrent UTIs.  Wears Depends   Any bowel dysfunction?   Denies      Patient lives with her son next-door and  has video surveillance.  Once  a week there is someone who comes to interact with her.  Does the patient drive? No longer drives    Initial evaluation 07/25/2021    How long did patient have memory difficulties? She denies any memory changes, but her son reports that since 2023-03-20 after her husband died. Son was concerned because about 1 month ago she did not remember the 4 digits of the house alarm was trying to do 3 digits, but later that afternoon, she remembered that there were 4 digits.  She used to be a whiz with numbers, not now .  Most of the concerns are due to short-term memory.   There was an episode recently, where her son told the story, and immediately, she repeated the story without recall that her son had just said the same thing. Patient lives with:  Patient lives alone after losing her husband but her son lives next door repeats oneself?  Over and over for years, but we always joked about it, never made much of it .  Disoriented when walking into a room?  Patient denies   Leaving objects in unusual places?  Patient denies   Ambulates  with difficulty?   Patient denies   Recent falls?  Patient denies   Any head injuries?  Patient denies   History of seizures?   Patient denies. She was evaluated in 2017 for transient alteration of awareness but it was negative for seizures. Wandering behavior?  Patient denies   Patient drives?   Denies any issues, tries to stay off of the highway.  Son reports that she is a very good driver to date. Any mood changes such irritability agitation?  Denies  Any history of depression?:  Patient denies   Hallucinations? Son reports she sees things, like a light at nigh and a ring around it, but one da instead of basketball, she thought she saw a person.  Sometimes she states is shaped like a leaf, but she thinks it is an Agricultural consultant.  Her son is not sure if this is an hallucination or is distorted image because of her history of glaucoma.  If she senses that is something moving in the woods, she is afraid to go outside. Paranoia?  She was paranoid about a neighbor for a while, but even after his death, and other people moving into that house she is still gets anxious about passing by that house .  She is also afraid of the darkness, and does not like to go downstairs to the laundry room, so she washes all her clothing by hand although she is working on it .   Patient reports that he sleeps well without vivid dreams, REM behavior or sleepwalking   History of sleep apnea?  Patient denies   Any hygiene concerns?  Denies Independent of  bathing and dressing?  Endorsed  Does the patient needs help with medications? Son monitors, she has a pill tray  Who is in charge of the finances?  Patient is in charge but son monitors, she doesn't like [employer] so she would not pay so she got behind.  She doesn't like to spend money . Any changes in appetite?  Never has been a good eater .  Patient have trouble swallowing? Patient denies   Does the patient cook?  Patient denies   Any kitchen accidents such as leaving the stove on? Patient denies   Any headaches?  Patient denies   Double vision? Patient denies  Any focal numbness or tingling?  Patient denies   Chronic back pain Patient denies   Unilateral weakness?  Patient denies   Any tremors?  Patient denies   Any history of anosmia?  Patient denies   Any incontinence of urine?  Patient denies   Any bowel dysfunction?   Patient denies    History of heavy alcohol intake?  Patient denies   History of heavy tobacco use?  Patient denies   Family history of dementia?   Aunt with dementia     PREVIOUS MEDICATIONS:   CURRENT MEDICATIONS:  Outpatient Encounter Medications as of 07/17/2023  Medication Sig   atorvastatin  (LIPITOR) 40 MG tablet Take 1 tablet (40 mg total) by mouth daily.   Calcium  Carbonate-Vit D-Min (CALCIUM -VITAMIN D -MINERALS) 600-400 MG-UNIT CHEW Chew 1 tablet by mouth daily.   Cholecalciferol (VITAMIN D3) 25 MCG (1000 UT) CAPS Take 1 capsule (1,000 Units total) by mouth daily.   clopidogrel  (PLAVIX ) 75 MG tablet Take 1 tablet (75 mg total) by mouth daily.   co-enzyme Q-10 30 MG capsule Take 30 mg by mouth daily.   cyanocobalamin  (VITAMIN B12) 500 MCG tablet Take 1 tablet (500 mcg total) by mouth daily.   denosumab  (PROLIA ) 60 MG/ML SOSY injection Inject 60 mg into the skin every 6 (six) months.   divalproex  (DEPAKOTE ) 125 MG DR tablet TAKE 1 TABLET (125 MG TOTAL) BY MOUTH AT BEDTIME (Patient taking differently: Take 125 mg by mouth daily.)   levothyroxine   (SYNTHROID ) 25 MCG tablet Take 1 tablet (25 mcg total) by mouth daily before breakfast.   loratadine  (CLARITIN ) 10 MG tablet Take 10 mg by mouth daily.   Magnesium  250 MG CAPS Take 1 capsule by mouth at bedtime.   memantine  (NAMENDA ) 10 MG tablet TAKE 1 TABLET BY MOUTH TWICE A DAY   Pyridoxine  HCl (VITAMIN B-6 PO) Take 1 tablet by mouth daily.   sertraline  (ZOLOFT ) 25 MG tablet Take 1 tablet (25 mg total) by mouth daily.   triamcinolone  (KENALOG ) 0.147 MG/GM topical spray Apply topically 2 (two) times daily as needed (psoriasis rash on scalp).   [DISCONTINUED] rivastigmine  (EXELON ) 1.5 MG capsule Take 1 cap at night for 2 weeks, then increase to 1 cap twice daily   rivastigmine  (EXELON ) 3 MG capsule Take 1 cap  twice daily   No facility-administered encounter medications on file as of 07/17/2023.       02/17/2020    1:48 PM 02/14/2019    3:01 PM  MMSE - Mini Mental State Exam  Not completed: Unable to complete   Orientation to time  5  Orientation to Place  5  Registration  3  Attention/ Calculation  5  Recall  3  Language- repeat  1      08/02/2021   12:00 PM  Montreal Cognitive Assessment   Visuospatial/ Executive (0/5) 2  Naming (0/3) 2  Attention: Read list of digits (0/2) 2  Attention: Read list of letters (0/1) 1  Attention: Serial 7 subtraction starting at 100 (0/3) 3  Language: Repeat phrase (0/2) 1  Language : Fluency (0/1) 0  Abstraction (0/2) 0  Delayed Recall (0/5) 0  Orientation (0/6) 4  Total 15  Adjusted Score (based on education) 15    Objective:     PHYSICAL EXAMINATION:    VITALS:   Vitals:   07/17/23 1012  BP: (!) 114/59  Pulse: 79  Resp: 20  SpO2: 95%  Weight: 125 lb (56.7 kg)    GEN:  The patient  appears stated age and is in NAD. HEENT:  Normocephalic, atraumatic.   Neurological examination:  General: NAD, well-groomed, appears stated age. Orientation: The patient is alert. Not  oriented to person, place and date Cranial nerves: There  is good facial symmetry.The speech is fluent and clear. No aphasia or dysarthria. Fund of knowledge is reduced. Recent and remote memory are impaired. Attention and concentration are reduced. Able to name objects and  unable to repeat phrases.  Hearing is intact to conversational tone.   Sensation: Sensation is intact to light touch throughout Motor: Strength is at least antigravity x4. DTR's 2/4 in UE/LE     Movement examination: Tone: There is normal tone in the UE/LE Abnormal movements:  no tremor.  No myoclonus.  No asterixis.   Coordination:  There is no decremation with RAM's. Normal finger to nose  Gait and Station: The patient has no difficulty arising out of a deep-seated chair without the use of the hands. The patient's stride length is good.  Gait is cautious and narrow.    Thank you for allowing us  the opportunity to participate in the care of this nice patient. Please do not hesitate to contact us  for any questions or concerns.   Total time spent on today's visit was 23 minutes dedicated to this patient today, preparing to see patient, examining the patient, ordering tests and/or medications and counseling the patient, documenting clinical information in the EHR or other health record, independently interpreting results and communicating results to the patient/family, discussing treatment and goals, answering patient's questions and coordinating care.  Cc:  Rilla Baller, MD  Camie Sevin 07/17/2023 11:10 AM

## 2023-07-20 DIAGNOSIS — Z9842 Cataract extraction status, left eye: Secondary | ICD-10-CM | POA: Diagnosis not present

## 2023-07-20 DIAGNOSIS — H524 Presbyopia: Secondary | ICD-10-CM | POA: Diagnosis not present

## 2023-07-20 DIAGNOSIS — Z9841 Cataract extraction status, right eye: Secondary | ICD-10-CM | POA: Diagnosis not present

## 2023-08-16 ENCOUNTER — Other Ambulatory Visit: Payer: Self-pay | Admitting: Family Medicine

## 2023-08-16 DIAGNOSIS — E785 Hyperlipidemia, unspecified: Secondary | ICD-10-CM

## 2023-08-16 DIAGNOSIS — M810 Age-related osteoporosis without current pathological fracture: Secondary | ICD-10-CM

## 2023-08-16 DIAGNOSIS — E538 Deficiency of other specified B group vitamins: Secondary | ICD-10-CM

## 2023-08-16 DIAGNOSIS — E039 Hypothyroidism, unspecified: Secondary | ICD-10-CM

## 2023-08-17 ENCOUNTER — Other Ambulatory Visit (INDEPENDENT_AMBULATORY_CARE_PROVIDER_SITE_OTHER): Payer: Medicare HMO

## 2023-08-17 ENCOUNTER — Ambulatory Visit: Payer: Self-pay | Admitting: Family Medicine

## 2023-08-17 DIAGNOSIS — E785 Hyperlipidemia, unspecified: Secondary | ICD-10-CM | POA: Diagnosis not present

## 2023-08-17 DIAGNOSIS — E039 Hypothyroidism, unspecified: Secondary | ICD-10-CM | POA: Diagnosis not present

## 2023-08-17 DIAGNOSIS — M810 Age-related osteoporosis without current pathological fracture: Secondary | ICD-10-CM | POA: Diagnosis not present

## 2023-08-17 DIAGNOSIS — E538 Deficiency of other specified B group vitamins: Secondary | ICD-10-CM | POA: Diagnosis not present

## 2023-08-17 LAB — COMPREHENSIVE METABOLIC PANEL WITH GFR
ALT: 9 U/L (ref 0–35)
AST: 14 U/L (ref 0–37)
Albumin: 3.9 g/dL (ref 3.5–5.2)
Alkaline Phosphatase: 53 U/L (ref 39–117)
BUN: 5 mg/dL — ABNORMAL LOW (ref 6–23)
CO2: 30 meq/L (ref 19–32)
Calcium: 8.4 mg/dL (ref 8.4–10.5)
Chloride: 105 meq/L (ref 96–112)
Creatinine, Ser: 0.65 mg/dL (ref 0.40–1.20)
GFR: 84.06 mL/min (ref >60.00)
Glucose, Bld: 92 mg/dL (ref 70–99)
Potassium: 3.8 meq/L (ref 3.5–5.1)
Sodium: 142 meq/L (ref 135–145)
Total Bilirubin: 0.4 mg/dL (ref 0.2–1.2)
Total Protein: 6.1 g/dL (ref 6.0–8.3)

## 2023-08-17 LAB — LIPID PANEL
Cholesterol: 226 mg/dL — ABNORMAL HIGH (ref 0–200)
HDL: 41.8 mg/dL (ref >39.00)
LDL Cholesterol: 154 mg/dL — ABNORMAL HIGH (ref 0–99)
NonHDL: 184.46
Total CHOL/HDL Ratio: 5
Triglycerides: 152 mg/dL — ABNORMAL HIGH (ref 0.0–149.0)
VLDL: 30.4 mg/dL (ref 0.0–40.0)

## 2023-08-17 LAB — VITAMIN B12: Vitamin B-12: 932 pg/mL — ABNORMAL HIGH (ref 211–911)

## 2023-08-17 LAB — TSH: TSH: 4.53 u[IU]/mL (ref 0.35–5.50)

## 2023-08-17 LAB — VITAMIN D 25 HYDROXY (VIT D DEFICIENCY, FRACTURES): VITD: 38.75 ng/mL (ref 30.00–100.00)

## 2023-08-24 ENCOUNTER — Encounter: Payer: Self-pay | Admitting: Family Medicine

## 2023-08-24 ENCOUNTER — Ambulatory Visit (INDEPENDENT_AMBULATORY_CARE_PROVIDER_SITE_OTHER): Payer: Medicare HMO | Admitting: Family Medicine

## 2023-08-24 ENCOUNTER — Other Ambulatory Visit: Payer: Self-pay | Admitting: Family Medicine

## 2023-08-24 VITALS — BP 118/82 | HR 63 | Temp 97.7°F | Ht 62.0 in | Wt 125.5 lb

## 2023-08-24 DIAGNOSIS — E039 Hypothyroidism, unspecified: Secondary | ICD-10-CM

## 2023-08-24 DIAGNOSIS — L409 Psoriasis, unspecified: Secondary | ICD-10-CM

## 2023-08-24 DIAGNOSIS — Z0000 Encounter for general adult medical examination without abnormal findings: Secondary | ICD-10-CM | POA: Diagnosis not present

## 2023-08-24 DIAGNOSIS — G309 Alzheimer's disease, unspecified: Secondary | ICD-10-CM

## 2023-08-24 DIAGNOSIS — E538 Deficiency of other specified B group vitamins: Secondary | ICD-10-CM

## 2023-08-24 DIAGNOSIS — Z1231 Encounter for screening mammogram for malignant neoplasm of breast: Secondary | ICD-10-CM

## 2023-08-24 DIAGNOSIS — E785 Hyperlipidemia, unspecified: Secondary | ICD-10-CM

## 2023-08-24 DIAGNOSIS — Z8673 Personal history of transient ischemic attack (TIA), and cerebral infarction without residual deficits: Secondary | ICD-10-CM

## 2023-08-24 DIAGNOSIS — F02B4 Dementia in other diseases classified elsewhere, moderate, with anxiety: Secondary | ICD-10-CM | POA: Diagnosis not present

## 2023-08-24 DIAGNOSIS — Z7189 Other specified counseling: Secondary | ICD-10-CM | POA: Diagnosis not present

## 2023-08-24 DIAGNOSIS — M810 Age-related osteoporosis without current pathological fracture: Secondary | ICD-10-CM | POA: Diagnosis not present

## 2023-08-24 DIAGNOSIS — R634 Abnormal weight loss: Secondary | ICD-10-CM

## 2023-08-24 DIAGNOSIS — F411 Generalized anxiety disorder: Secondary | ICD-10-CM

## 2023-08-24 DIAGNOSIS — G459 Transient cerebral ischemic attack, unspecified: Secondary | ICD-10-CM

## 2023-08-24 MED ORDER — CETIRIZINE HCL 10 MG PO TABS: 10.0000 mg | ORAL_TABLET | Freq: Every day | ORAL | Status: AC

## 2023-08-24 MED ORDER — LEVOTHYROXINE SODIUM 25 MCG PO TABS: 25.0000 ug | ORAL_TABLET | Freq: Every day | ORAL | 4 refills | Status: AC

## 2023-08-24 MED ORDER — SERTRALINE HCL 25 MG PO TABS: 25.0000 mg | ORAL_TABLET | Freq: Every day | ORAL | 4 refills | Status: AC

## 2023-08-24 MED ORDER — CLOPIDOGREL BISULFATE 75 MG PO TABS: 75.0000 mg | ORAL_TABLET | Freq: Every day | ORAL | 4 refills | Status: AC

## 2023-08-24 MED ORDER — ATORVASTATIN CALCIUM 40 MG PO TABS: 40.0000 mg | ORAL_TABLET | Freq: Every day | ORAL | 4 refills | Status: AC

## 2023-08-24 MED ORDER — TRIAMCINOLONE ACETONIDE 0.1 % EX CREA: 1.0000 | TOPICAL_CREAM | Freq: Two times a day (BID) | CUTANEOUS | 0 refills | Status: AC

## 2023-08-24 MED ORDER — ALENDRONATE SODIUM 70 MG PO TABS: 70.0000 mg | ORAL_TABLET | ORAL | 11 refills | Status: AC

## 2023-08-24 NOTE — Assessment & Plan Note (Signed)
 Preventative protocols reviewed and updated unless pt declined. Discussed healthy diet and lifestyle.

## 2023-08-24 NOTE — Progress Notes (Unsigned)
 Ph: [phone] Fax: [phone]   Patient ID: Marilyn Cook Lyme, female    DOB: 1945-10-24, 78 y.o.   MRN: [id number]  This visit was conducted in person.  BP 118/82   Pulse 63   Temp 97.7 F (36.5 C) (Oral)   Ht 5' 2 (1.575 m)   Wt 125 lb 8 oz (56.9 kg)   SpO2 95%   BMI 22.95 kg/m    CC: CPE Subjective:   HPI: Marilyn Cook is a 78 y.o. female presenting on 08/24/2023 for Annual Exam (Pt accompanied by her son Chyrl)   Saw health advisor 05/2023 for medicare wellness visit. Note reviewed.     05/25/2023    9:09 AM 05/12/2022   11:12 AM 05/08/2021   11:38 AM  6CIT Screen  What Year? 4 points 0 points 0 points  What month? 3 points 3 points 0 points  What time? 3 points 0 points 0 points  Count back from 20 4 points 0 points 0 points  Months in reverse 4 points 4 points 0 points  Repeat phrase 10 points 10 points 4 points  Total Score 28 points 17 points 4 points  Failed cognitive assessment, known alzheimer's  No results found.  Flowsheet Row Office Visit from 06/04/2023 in [facility] [place]  PHQ-2 Total Score 0       07/17/2023   10:12 AM 06/04/2023    3:05 PM 05/25/2023    9:12 AM 03/09/2023    9:05 AM 09/05/2022    9:25 AM  Fall Risk   Falls in the past year? 0 1 1 0 0  Number falls in past yr: 0 0 0 0 0  Injury with Fall? 0 0 0 0 0  Risk for fall due to :  History of fall(s) History of fall(s)    Follow up Falls evaluation completed Falls evaluation completed Falls evaluation completed;Falls prevention discussed Falls evaluation completed Falls evaluation completed     Lost husband Lemond 02/2019. Son Chyrl lives next door.  Known Alzheimer's dementia regularly sees [facility] neurology managed on memantine  10mg  bid, and excelon 3mg  BID. Also continues depakote  125mg  BID for h/o hallucinations and sundowning.  Aide has started coming out to house a few times a week.   Saw Dr Richie 12/2021 neuropsychological testing - testing consistent with  alzheimer's disease. She has experienced visual hallucinations, she did complete bilat cataract surgery.  She also continues b12 500mcg daily.    Ran out of sertraline  3 wks ago - notes visual hallucinations are more bothersome since running out.   Triamcinolone  cream worked better than spray for scalp psoriasis - desire refilled.    Preventative: COLONOSCOPY 04/2015 diverticulosis, int hem, rpt 10 yrs (Nandigam) - age out Pap smear - Partial hysterectomy for benign reason (fibroids), ovaries remain. Bimanual only done 2015. Denies pelvic pain or pressure.  Mammogram 06/2022 - Birads1 @ [facility]  DEXA 03/2020 - spine -1.0, R hip neck -1.9 - however dx osteoporosis given h/o vertebral compression fracture on previous CXR. Started prolia  06/2020, last injection 02/10/2023- they desire to stop at this time and try oral fosamax  instead. Flubloc yearly (egg allergy)  Covid vaccine Pfizer 01/2019, 02/2019, no booster Pneumovax 07/31/2011, prevnar-13 01/2015  Tdap 07/2011  zostavax - 11/2015  RSV - discussed  shingrix - 07/2017, 11/2017  Advanced directive discussion: advanced directive reviewed, scanned 04/2023. Son Asharia Lotter is HCPOA, would not desire prolonged life support if terminal condition. Wants living will followed.  Seat belt use discussed Sunscreen use discussed - allergic to sunscreen. No changing moles on skin. Sees dermatologist yearly.  Non smoker Alcohol - none Dentist - q6 mo Eye exam yearly - [facility]  Bowel - no constipation  Bladder - no incontinence   Widow - husband Lemond died from cardiac arrest 03/29/2019 One son Bronwen) Occupation: worked at [employer] at [employer] - retired 03/2017 Activity: no regular exercise, active through rehab Diet: some water, fruits/vegetables daily     Relevant past medical, surgical, family and social history reviewed and updated as indicated. Interim medical history since our last visit reviewed. Allergies and medications reviewed and  updated. Outpatient Medications Prior to Visit  Medication Sig Dispense Refill   Calcium  Carbonate-Vit D-Min (CALCIUM -VITAMIN D -MINERALS) 600-400 MG-UNIT CHEW Chew 1 tablet by mouth daily.     Cholecalciferol (VITAMIN D3) 25 MCG (1000 UT) CAPS Take 1 capsule (1,000 Units total) by mouth daily.     co-enzyme Q-10 30 MG capsule Take 30 mg by mouth daily.     cyanocobalamin  (VITAMIN B12) 500 MCG tablet Take 1 tablet (500 mcg total) by mouth daily.     divalproex  (DEPAKOTE ) 125 MG DR tablet TAKE 1 TABLET (125 MG TOTAL) BY MOUTH AT BEDTIME (Patient taking differently: Take 125 mg by mouth daily.) 90 tablet 1   Magnesium  250 MG CAPS Take 1 capsule by mouth at bedtime.     memantine  (NAMENDA ) 10 MG tablet TAKE 1 TABLET BY MOUTH TWICE A DAY 180 tablet 1   Pyridoxine  HCl (VITAMIN B-6 PO) Take 1 tablet by mouth daily.     rivastigmine  (EXELON ) 3 MG capsule Take 1 cap  twice daily 60 capsule 11   denosumab  (PROLIA ) 60 MG/ML SOSY injection Inject 60 mg into the skin every 6 (six) months. 1 mL 1   loratadine  (CLARITIN ) 10 MG tablet Take 10 mg by mouth daily.     sertraline  (ZOLOFT ) 25 MG tablet Take 1 tablet (25 mg total) by mouth daily. 90 tablet 4   triamcinolone  (KENALOG ) 0.147 MG/GM topical spray Apply topically 2 (two) times daily as needed (psoriasis rash on scalp). 63 g 3   atorvastatin  (LIPITOR) 40 MG tablet Take 1 tablet (40 mg total) by mouth daily. 90 tablet 4   clopidogrel  (PLAVIX ) 75 MG tablet Take 1 tablet (75 mg total) by mouth daily. 90 tablet 4   levothyroxine  (SYNTHROID ) 25 MCG tablet Take 1 tablet (25 mcg total) by mouth daily before breakfast. 90 tablet 4   No facility-administered medications prior to visit.     Per HPI unless specifically indicated in ROS section below Review of Systems  Constitutional:  Negative for activity change, appetite change, chills, fatigue, fever and unexpected weight change.  HENT:  Negative for hearing loss.   Eyes:  Negative for visual disturbance.   Respiratory:  Negative for cough, chest tightness, shortness of breath and wheezing.   Cardiovascular:  Negative for chest pain, palpitations and leg swelling.  Gastrointestinal:  Negative for abdominal distention, abdominal pain, blood in stool, constipation, diarrhea, nausea and vomiting.  Genitourinary:  Negative for difficulty urinating and hematuria.  Musculoskeletal:  Negative for arthralgias, myalgias and neck pain.  Skin:  Negative for rash.  Neurological:  Negative for dizziness, seizures, syncope and headaches.  Hematological:  Negative for adenopathy. Does not bruise/bleed easily.  Psychiatric/Behavioral:  Negative for dysphoric mood. The patient is not nervous/anxious.     Objective:  BP 118/82   Pulse 63   Temp 97.7 F (36.5  C) (Oral)   Ht 5' 2 (1.575 m)   Wt 125 lb 8 oz (56.9 kg)   SpO2 95%   BMI 22.95 kg/m   Wt Readings from Last 3 Encounters:  08/24/23 125 lb 8 oz (56.9 kg)  07/17/23 125 lb (56.7 kg)  06/04/23 125 lb (56.7 kg)      Physical Exam Vitals and nursing note reviewed.  Constitutional:      Appearance: Normal appearance. She is not ill-appearing.  HENT:     Head: Normocephalic and atraumatic.     Right Ear: Tympanic membrane, ear canal and external ear normal. There is no impacted cerumen.     Left Ear: Tympanic membrane, ear canal and external ear normal. There is no impacted cerumen.     Mouth/Throat:     Mouth: Mucous membranes are moist.     Pharynx: Oropharynx is clear. No oropharyngeal exudate or posterior oropharyngeal erythema.  Eyes:     General:        Right eye: No discharge.        Left eye: No discharge.     Extraocular Movements: Extraocular movements intact.     Conjunctiva/sclera: Conjunctivae normal.     Pupils: Pupils are equal, round, and reactive to light.  Neck:     Thyroid : No thyroid  mass or thyromegaly.     Vascular: No carotid bruit.  Cardiovascular:     Rate and Rhythm: Normal rate and regular rhythm.     Pulses:  Normal pulses.     Heart sounds: Normal heart sounds. No murmur heard. Pulmonary:     Effort: Pulmonary effort is normal. No respiratory distress.     Breath sounds: Normal breath sounds. No wheezing, rhonchi or rales.  Abdominal:     General: Bowel sounds are normal. There is no distension.     Palpations: Abdomen is soft. There is no mass.     Tenderness: There is no abdominal tenderness. There is no guarding or rebound.     Hernia: No hernia is present.  Musculoskeletal:     Cervical back: Normal range of motion and neck supple. No rigidity.     Right lower leg: No edema.     Left lower leg: No edema.  Lymphadenopathy:     Cervical: No cervical adenopathy.  Skin:    General: Skin is warm and dry.     Findings: No rash.  Neurological:     General: No focal deficit present.     Mental Status: She is alert. Mental status is at baseline.  Psychiatric:        Mood and Affect: Mood normal.        Behavior: Behavior normal.       Results for orders placed or performed in visit on 08/17/23  Vitamin B12   Collection Time: 08/17/23  8:02 AM  Result Value Ref Range   Vitamin B-12 932 (H) 211 - 911 pg/mL  VITAMIN D  25 Hydroxy (Vit-D Deficiency, Fractures)   Collection Time: 08/17/23  8:02 AM  Result Value Ref Range   VITD 38.75 30.00 - 100.00 ng/mL  TSH   Collection Time: 08/17/23  8:02 AM  Result Value Ref Range   TSH 4.53 0.35 - 5.50 uIU/mL  Comprehensive metabolic panel with GFR   Collection Time: 08/17/23  8:02 AM  Result Value Ref Range   Sodium 142 135 - 145 mEq/L   Potassium 3.8 3.5 - 5.1 mEq/L   Chloride 105 96 - 112 mEq/L  CO2 30 19 - 32 mEq/L   Glucose, Bld 92 70 - 99 mg/dL   BUN 5 (L) 6 - 23 mg/dL   Creatinine, Ser 9.34 0.40 - 1.20 mg/dL   Total Bilirubin 0.4 0.2 - 1.2 mg/dL   Alkaline Phosphatase 53 39 - 117 U/L   AST 14 0 - 37 U/L   ALT 9 0 - 35 U/L   Total Protein 6.1 6.0 - 8.3 g/dL   Albumin 3.9 3.5 - 5.2 g/dL   GFR 15.93 >39.99 mL/min   Calcium  8.4  8.4 - 10.5 mg/dL  Lipid panel   Collection Time: 08/17/23  8:02 AM  Result Value Ref Range   Cholesterol 226 (H) 0 - 200 mg/dL   Triglycerides 847.9 (H) 0.0 - 149.0 mg/dL   HDL 58.19 >60.99 mg/dL   VLDL 69.5 0.0 - 59.9 mg/dL   LDL Cholesterol 845 (H) 0 - 99 mg/dL   Total CHOL/HDL Ratio 5    NonHDL 184.46     Assessment & Plan:   Problem List Items Addressed This Visit     Health maintenance examination - Primary (Chronic)   Preventative protocols reviewed and updated unless pt declined. Discussed healthy diet and lifestyle.       Advanced directives, counseling/discussion (Chronic)   Advanced directive discussion: advanced directive reviewed, scanned 04/2023. Son Jola Critzer is HCPOA, would not desire prolonged life support if terminal condition. Wants living will followed.       HLD (hyperlipidemia)   Chronic, back on atorvastatin  but levels remain high and largely unchanged since last year - son will verify she continues atorvastatin  at home.  The 10-year ASCVD risk score (Arnett DK, et al., 2019) is: 18.2%   Values used to calculate the score:     Age: 39 years     Clincally relevant sex: Female     Is Non-Hispanic African American: No     Diabetic: No     Tobacco smoker: No     Systolic Blood Pressure: 118 mmHg     Is BP treated: No     HDL Cholesterol: 41.8 mg/dL     Total Cholesterol: 226 mg/dL       Relevant Medications   atorvastatin  (LIPITOR) 40 MG tablet   Scalp psoriasis   TCI cream works better than spray - refilled.       Unintentional weight loss   Weight is stable.      TIA (transient ischemic attack)   Continue plavix , atorvastatin .       Relevant Medications   atorvastatin  (LIPITOR) 40 MG tablet   clopidogrel  (PLAVIX ) 75 MG tablet   Osteoporosis   Desire to stop prolia  due to cost and start fosamax . Reviewed correct administration of fosamax .  Continue calcium /vit D and extra vit D3.  Order updated DEXA scan.       Relevant Medications    alendronate  (FOSAMAX ) 70 MG tablet   Other Relevant Orders   DG Bone Density   Alzheimer dementia (HCC)   No change in functional status or level of care need. Son lives next door.  She has health aide that comes to house.       Relevant Medications   sertraline  (ZOLOFT ) 25 MG tablet   Borderline hypothyroidism   TSH stable - continue low dose levothyroxine  25mcg daily      Relevant Medications   levothyroxine  (SYNTHROID ) 25 MCG tablet   Vitamin B12 deficiency   Continue oral b12 replacement      Generalized anxiety  disorder   Restart sertraline  25mg  daily.       Relevant Medications   sertraline  (ZOLOFT ) 25 MG tablet   Hypocalcemia   Possibly prolia  related - switch to fosamax , continue calcium  and vit D        Meds ordered this encounter  Medications   atorvastatin  (LIPITOR) 40 MG tablet    Sig: Take 1 tablet (40 mg total) by mouth daily.    Dispense:  90 tablet    Refill:  4   clopidogrel  (PLAVIX ) 75 MG tablet    Sig: Take 1 tablet (75 mg total) by mouth daily.    Dispense:  90 tablet    Refill:  4   levothyroxine  (SYNTHROID ) 25 MCG tablet    Sig: Take 1 tablet (25 mcg total) by mouth daily before breakfast.    Dispense:  90 tablet    Refill:  4   triamcinolone  cream (KENALOG ) 0.1 %    Sig: Apply 1 Application topically 2 (two) times daily. Apply to AA to scalp.    Dispense:  80 g    Refill:  0   cetirizine  (ZYRTEC ) 10 MG tablet    Sig: Take 1 tablet (10 mg total) by mouth daily.   alendronate  (FOSAMAX ) 70 MG tablet    Sig: Take 1 tablet (70 mg total) by mouth every 7 (seven) days. Take with a full glass of water on an empty stomach.    Dispense:  4 tablet    Refill:  11   sertraline  (ZOLOFT ) 25 MG tablet    Sig: Take 1 tablet (25 mg total) by mouth daily.    Dispense:  90 tablet    Refill:  4    Orders Placed This Encounter  Procedures   DG Bone Density    Standing Status:   Future    Expiration Date:   08/23/2024    Reason for Exam (SYMPTOM   OR DIAGNOSIS REQUIRED):   osteoporosis    [facility]    Patient Instructions  Ok to stop prolia . Start Fosamax  once weekly.  Administer first thing in the morning and >=30 minutes before the first food, beverage (except plain water), or other medication(s) of the day. Do not take with mineral water or with other beverages. Patients should be instructed to stay upright (not to lie down) for >=30 minutes and until after first food of the day (to reduce esophageal irritation). Must be taken with 6 to 8 oz of plain water. The tablet should be swallowed whole; do not chew or suck.   Call to schedule mammogram at your convenience: [facility] [phone]. Ask to do bone density scan on same day. Let us  know if any difficulty scheduling.   Good to see you today Return as needed or in 6 months for follow up visit.   Follow up plan: Return in about 6 months (around 02/24/2024) for follow up visit.  Anton Blas, MD

## 2023-08-24 NOTE — Assessment & Plan Note (Signed)
 Desire to stop prolia  due to cost and start fosamax . Reviewed correct administration of fosamax .  Continue calcium /vit D and extra vit D3.  Order updated DEXA scan.

## 2023-08-24 NOTE — Assessment & Plan Note (Signed)
 Advanced directive discussion: advanced directive reviewed, scanned 04/2023. Son Samarra Ridgely is HCPOA, would not desire prolonged life support if terminal condition. Wants living will followed.

## 2023-08-24 NOTE — Patient Instructions (Addendum)
 Ok to stop prolia . Start Fosamax  once weekly.  Administer first thing in the morning and >=30 minutes before the first food, beverage (except plain water), or other medication(s) of the day. Do not take with mineral water or with other beverages. Patients should be instructed to stay upright (not to lie down) for >=30 minutes and until after first food of the day (to reduce esophageal irritation). Must be taken with 6 to 8 oz of plain water. The tablet should be swallowed whole; do not chew or suck.   Call to schedule mammogram at your convenience: [facility] [phone]. Ask to do bone density scan on same day. Let us  know if any difficulty scheduling.   Good to see you today Return as needed or in 6 months for follow up visit.

## 2023-08-25 NOTE — Assessment & Plan Note (Signed)
 No change in functional status or level of care need. Son lives next door.  She has health aide that comes to house.

## 2023-08-25 NOTE — Assessment & Plan Note (Signed)
 Weight is stable

## 2023-08-25 NOTE — Assessment & Plan Note (Signed)
Re-start sertraline 25 mg daily

## 2023-08-25 NOTE — Assessment & Plan Note (Signed)
 Continue plavix , atorvastatin .

## 2023-08-25 NOTE — Assessment & Plan Note (Signed)
 Possibly prolia  related - switch to fosamax , continue calcium  and vit D

## 2023-08-25 NOTE — Assessment & Plan Note (Signed)
Continue oral b12 replacement.  

## 2023-08-25 NOTE — Assessment & Plan Note (Signed)
 Chronic, back on atorvastatin  but levels remain high and largely unchanged since last year - son will verify she continues atorvastatin  at home.  The 10-year ASCVD risk score (Arnett DK, et al., 2019) is: 18.2%   Values used to calculate the score:     Age: 78 years     Clincally relevant sex: Female     Is Non-Hispanic African American: No     Diabetic: No     Tobacco smoker: No     Systolic Blood Pressure: 118 mmHg     Is BP treated: No     HDL Cholesterol: 41.8 mg/dL     Total Cholesterol: 226 mg/dL

## 2023-08-25 NOTE — Assessment & Plan Note (Signed)
 TCI cream works better than spray - refilled.

## 2023-08-25 NOTE — Assessment & Plan Note (Signed)
 TSH stable - continue low dose levothyroxine  25mcg daily

## 2023-09-03 ENCOUNTER — Telehealth: Payer: Self-pay

## 2023-09-03 ENCOUNTER — Other Ambulatory Visit: Payer: Self-pay

## 2023-09-03 ENCOUNTER — Other Ambulatory Visit: Payer: Self-pay | Admitting: Family Medicine

## 2023-09-03 DIAGNOSIS — Z1231 Encounter for screening mammogram for malignant neoplasm of breast: Secondary | ICD-10-CM

## 2023-09-03 DIAGNOSIS — M810 Age-related osteoporosis without current pathological fracture: Secondary | ICD-10-CM

## 2023-09-03 NOTE — Telephone Encounter (Signed)
  You have an order for:  []   3D Mammogram  [x]   Bone Density     Please call for appointment:   [x]   [facility]  [address] [place] [address]  [phone]  []   [facility])   [address]  [place], [place] [address]  [phone]  []   [facility]      [address] [place], [place]        [phone]         []   [facility]  [address] [place], [place]  [phone]  []  [facility]   [address]   [place], [place] [address]  [phone]  []  [facility]  [address] [place], [place] [address] [phone]    Make sure to wear two piece clothing  No lotions powders or deodorants the day of the appointment Make sure to bring picture ID and insurance card.  Bring list of medications you are currently taking including any supplements.   Schedule your screening mammogram through MyChart!   Select [facility] imaging sites can now be scheduled through MyChart.  Log into your MyChart account.  Go to 'Visit' (or 'Appointments' if  on mobile App) --> Schedule an  Appointment  Under 'Select a Reason for Visit' choose the Mammogram  Screening option.  Complete the pre-visit questions  and select the time and place that  best fits your schedule

## 2023-09-03 NOTE — Telephone Encounter (Signed)
 Copied from CRM #[id number]. Topic: Appointments - Scheduling Inquiry for Clinic >> Sep 02, 2023  2:37 PM Sasha M wrote: Reason for CRM: Son called in to let Dr. Rilla know that next at his mother has a mm but the facility does not do DEXA appts anymore. He would to receive a call back at [phone] to let him know where they can schedule at

## 2023-09-11 ENCOUNTER — Ambulatory Visit: Admitting: Physician Assistant

## 2023-09-11 ENCOUNTER — Ambulatory Visit

## 2023-10-02 ENCOUNTER — Ambulatory Visit (INDEPENDENT_AMBULATORY_CARE_PROVIDER_SITE_OTHER): Admitting: Family Medicine

## 2023-10-02 ENCOUNTER — Encounter: Payer: Self-pay | Admitting: Family Medicine

## 2023-10-02 VITALS — BP 118/80 | HR 75 | Temp 98.3°F | Ht 62.0 in | Wt 120.1 lb

## 2023-10-02 DIAGNOSIS — F0280 Dementia in other diseases classified elsewhere without behavioral disturbance: Secondary | ICD-10-CM | POA: Diagnosis not present

## 2023-10-02 DIAGNOSIS — G459 Transient cerebral ischemic attack, unspecified: Secondary | ICD-10-CM

## 2023-10-02 DIAGNOSIS — U071 COVID-19: Secondary | ICD-10-CM

## 2023-10-02 DIAGNOSIS — G309 Alzheimer's disease, unspecified: Secondary | ICD-10-CM | POA: Diagnosis not present

## 2023-10-02 DIAGNOSIS — I679 Cerebrovascular disease, unspecified: Secondary | ICD-10-CM | POA: Diagnosis not present

## 2023-10-02 LAB — POC COVID19 BINAXNOW: SARS Coronavirus 2 Ag: POSITIVE — AB

## 2023-10-02 MED ORDER — BENZONATATE 100 MG PO CAPS: 100.0000 mg | ORAL_CAPSULE | Freq: Three times a day (TID) | ORAL | 0 refills | Status: AC | PRN

## 2023-10-02 MED ORDER — NIRMATRELVIR/RITONAVIR (PAXLOVID)TABLET: 3.0000 | ORAL_TABLET | Freq: Two times a day (BID) | ORAL | 0 refills | Status: AC

## 2023-10-02 NOTE — Addendum Note (Signed)
 Addended by: DELORES NO L on: 10/02/2023 03:56 PM   Modules accepted: Orders

## 2023-10-02 NOTE — Assessment & Plan Note (Addendum)
 Reviewed currently approved antiviral treatments.  Reviewed expected course of illness, anticipated course of recovery, as well as red flags to suggest COVID pneumonia and/or to seek urgent in-person care. Reviewed CDC isolation/quarantine guidelines.  Encouraged fluids and rest. Reviewed further supportive care measures at home including vit C 500mg  bid, vit D 2000 IU daily, zinc 100mg  daily, tylenol  PRN, pepcid 20mg  BID PRN.   Recommend:  Full dose paxlovid  (GFR 84) - they may wait 24-48 hours prior to filling to see if she will improve on her own.  Paxlovid  drug interactions:  Atorvastatin  - hold while on paxlovid  Clopidogrel  - ok to continue -as indication for this was possible h/o TIA vs ophthalmic migraine.   Reviewed paxlovid .[email or web address] option if antiviral unaffordable.

## 2023-10-02 NOTE — Patient Instructions (Addendum)
 COVID test returned positive Isolate for 5 days then mask wearing when around others for a total of 10 days.  Watch for next 1-2 days, but start PAXLOVID  ANTIVIRAL if worsening symptoms. You do have to start this within 5 days of symptom onset.  Hold atorvastatin  while on paxlovid , ok to continue paxlovid .  If unaffordable, check this website for discount: paxlovid .[email or web address]   Push fluids and rest, vitamin C, Vtiamin D to boost the immune system.  Let us  know if not improving with treatment.   I have sent tessalon  perls to your pharmacy for cough suppression - swallow don't chew.

## 2023-10-02 NOTE — Progress Notes (Signed)
 Ph: [phone] Fax: [phone]   Patient ID: Marilyn Cook Lyme, female    DOB: [birth year], 78 y.o.   MRN: [id number]  This visit was conducted in person.  BP 118/80   Pulse 75   Temp 98.3 F (36.8 C) (Oral)   Ht 5' 2 (1.575 m)   Wt 120 lb 2 oz (54.5 kg)   SpO2 96%   BMI 21.97 kg/m    CC: cough and chest congestion  Subjective:   HPI: Marilyn Cook is a 78 y.o. female presenting on 10/02/2023 for Medical Management of Chronic Issues (Pt C/o is coughing and chest congestion/Started 2 days ago. Pt has not treated the symtoms./No fever, no body aches. )   2d h/o cough ,chest congestion. Feels better during the day but cough worsens at night time. Some ST last night.   No fevers/chills, ear or tooth pain, ST, abd pain, nausea, diarrhea.  No sick contacts at home.  Treating with allergy medicine at night time as well as mucous relief pill.  Has tried claritin , zyrtec .   Non smoker No h/o asthma.   COVID vaccines x2.      Relevant past medical, surgical, family and social history reviewed and updated as indicated. Interim medical history since our last visit reviewed. Allergies and medications reviewed and updated. Outpatient Medications Prior to Visit  Medication Sig Dispense Refill   alendronate  (FOSAMAX ) 70 MG tablet Take 1 tablet (70 mg total) by mouth every 7 (seven) days. Take with a full glass of water on an empty stomach. 4 tablet 11   atorvastatin  (LIPITOR) 40 MG tablet Take 1 tablet (40 mg total) by mouth daily. 90 tablet 4   Calcium  Carbonate-Vit D-Min (CALCIUM -VITAMIN D -MINERALS) 600-400 MG-UNIT CHEW Chew 1 tablet by mouth daily.     cetirizine  (ZYRTEC ) 10 MG tablet Take 1 tablet (10 mg total) by mouth daily.     Cholecalciferol (VITAMIN D3) 25 MCG (1000 UT) CAPS Take 1 capsule (1,000 Units total) by mouth daily.     clopidogrel  (PLAVIX ) 75 MG tablet Take 1 tablet (75 mg total) by mouth daily. 90 tablet 4   co-enzyme Q-10 30 MG capsule Take 30 mg by mouth  daily.     cyanocobalamin  (VITAMIN B12) 500 MCG tablet Take 1 tablet (500 mcg total) by mouth daily.     divalproex  (DEPAKOTE ) 125 MG DR tablet TAKE 1 TABLET (125 MG TOTAL) BY MOUTH AT BEDTIME (Patient taking differently: Take 125 mg by mouth daily.) 90 tablet 1   levothyroxine  (SYNTHROID ) 25 MCG tablet Take 1 tablet (25 mcg total) by mouth daily before breakfast. 90 tablet 4   Magnesium  250 MG CAPS Take 1 capsule by mouth at bedtime.     memantine  (NAMENDA ) 10 MG tablet TAKE 1 TABLET BY MOUTH TWICE A DAY 180 tablet 1   Pyridoxine  HCl (VITAMIN B-6 PO) Take 1 tablet by mouth daily.     rivastigmine  (EXELON ) 3 MG capsule Take 1 cap  twice daily 60 capsule 11   sertraline  (ZOLOFT ) 25 MG tablet Take 1 tablet (25 mg total) by mouth daily. 90 tablet 4   triamcinolone  cream (KENALOG ) 0.1 % Apply 1 Application topically 2 (two) times daily. Apply to AA to scalp. 80 g 0   No facility-administered medications prior to visit.     Per HPI unless specifically indicated in ROS section below Review of Systems  Objective:  BP 118/80   Pulse 75   Temp 98.3 F (36.8 C) (Oral)  Ht 5' 2 (1.575 m)   Wt 120 lb 2 oz (54.5 kg)   SpO2 96%   BMI 21.97 kg/m   Wt Readings from Last 3 Encounters:  10/02/23 120 lb 2 oz (54.5 kg)  08/24/23 125 lb 8 oz (56.9 kg)  07/17/23 125 lb (56.7 kg)      Physical Exam Vitals and nursing note reviewed.  Constitutional:      Appearance: Normal appearance. She is not ill-appearing.  HENT:     Head: Normocephalic and atraumatic.     Right Ear: Tympanic membrane, ear canal and external ear normal. There is no impacted cerumen.     Left Ear: Tympanic membrane, ear canal and external ear normal. There is no impacted cerumen.     Nose: Congestion and rhinorrhea present.     Right Sinus: No maxillary sinus tenderness or frontal sinus tenderness.     Left Sinus: No maxillary sinus tenderness or frontal sinus tenderness.     Mouth/Throat:     Mouth: Mucous membranes are  moist.     Pharynx: Oropharynx is clear. No oropharyngeal exudate or posterior oropharyngeal erythema.  Eyes:     Extraocular Movements: Extraocular movements intact.     Conjunctiva/sclera: Conjunctivae normal.     Pupils: Pupils are equal, round, and reactive to light.  Cardiovascular:     Rate and Rhythm: Normal rate and regular rhythm.     Pulses: Normal pulses.     Heart sounds: Normal heart sounds. No murmur heard. Pulmonary:     Effort: Pulmonary effort is normal. No respiratory distress.     Breath sounds: Normal breath sounds. No wheezing, rhonchi or rales.     Comments: Lungs clear Musculoskeletal:     Right lower leg: No edema.     Left lower leg: No edema.  Lymphadenopathy:     Head:     Right side of head: No submental, submandibular, tonsillar, preauricular or posterior auricular adenopathy.     Left side of head: No submental, submandibular, tonsillar, preauricular or posterior auricular adenopathy.     Cervical: No cervical adenopathy.     Right cervical: No superficial cervical adenopathy.    Left cervical: No superficial cervical adenopathy.     Upper Body:     Right upper body: No supraclavicular adenopathy.     Left upper body: No supraclavicular adenopathy.  Skin:    Findings: No rash.  Neurological:     Mental Status: She is alert.  Psychiatric:        Mood and Affect: Mood normal.        Behavior: Behavior normal.       Results for orders placed or performed in visit on 08/17/23  Vitamin B12   Collection Time: 08/17/23  8:02 AM  Result Value Ref Range   Vitamin B-12 932 (H) 211 - 911 pg/mL  VITAMIN D  25 Hydroxy (Vit-D Deficiency, Fractures)   Collection Time: 08/17/23  8:02 AM  Result Value Ref Range   VITD 38.75 30.00 - 100.00 ng/mL  TSH   Collection Time: 08/17/23  8:02 AM  Result Value Ref Range   TSH 4.53 0.35 - 5.50 uIU/mL  Comprehensive metabolic panel with GFR   Collection Time: 08/17/23  8:02 AM  Result Value Ref Range   Sodium 142  135 - 145 mEq/L   Potassium 3.8 3.5 - 5.1 mEq/L   Chloride 105 96 - 112 mEq/L   CO2 30 19 - 32 mEq/L   Glucose, Bld 92 70 -  99 mg/dL   BUN 5 (L) 6 - 23 mg/dL   Creatinine, Ser 9.34 0.40 - 1.20 mg/dL   Total Bilirubin 0.4 0.2 - 1.2 mg/dL   Alkaline Phosphatase 53 39 - 117 U/L   AST 14 0 - 37 U/L   ALT 9 0 - 35 U/L   Total Protein 6.1 6.0 - 8.3 g/dL   Albumin 3.9 3.5 - 5.2 g/dL   GFR 15.93 >39.99 mL/min   Calcium  8.4 8.4 - 10.5 mg/dL  Lipid panel   Collection Time: 08/17/23  8:02 AM  Result Value Ref Range   Cholesterol 226 (H) 0 - 200 mg/dL   Triglycerides 847.9 (H) 0.0 - 149.0 mg/dL   HDL 58.19 >60.99 mg/dL   VLDL 69.5 0.0 - 59.9 mg/dL   LDL Cholesterol 845 (H) 0 - 99 mg/dL   Total CHOL/HDL Ratio 5    NonHDL 184.46     Assessment & Plan:   Problem List Items Addressed This Visit     TIA (transient ischemic attack)   Alzheimer dementia (HCC)   Cerebrovascular disease   COVID-19 virus infection - Primary   Reviewed currently approved antiviral treatments.  Reviewed expected course of illness, anticipated course of recovery, as well as red flags to suggest COVID pneumonia and/or to seek urgent in-person care. Reviewed CDC isolation/quarantine guidelines.  Encouraged fluids and rest. Reviewed further supportive care measures at home including vit C 500mg  bid, vit D 2000 IU daily, zinc 100mg  daily, tylenol  PRN, pepcid 20mg  BID PRN.   Recommend:  Full dose paxlovid  (GFR 84) - they may wait 24-48 hours prior to filling to see if she will improve on her own.  Paxlovid  drug interactions:  Atorvastatin  - hold while on paxlovid  Clopidogrel  - ok to continue -as indication for this was possible h/o TIA vs ophthalmic migraine.   Reviewed paxlovid .[email or web address] option if antiviral unaffordable.       Relevant Medications   nirmatrelvir /ritonavir  (PAXLOVID ) 20 x 150 MG & 10 x 100MG  TABS     Meds ordered this encounter  Medications   benzonatate  (TESSALON  PERLES) 100 MG capsule     Sig: Take 1 capsule (100 mg total) by mouth 3 (three) times daily as needed for cough.    Dispense:  30 capsule    Refill:  0   nirmatrelvir /ritonavir  (PAXLOVID ) 20 x 150 MG & 10 x 100MG  TABS    Sig: Take 3 tablets by mouth 2 (two) times daily for 5 days. (Take nirmatrelvir  150 mg two tablets twice daily for 5 days and ritonavir  100 mg one tablet twice daily for 5 days) Patient GFR is 84    Dispense:  30 tablet    Refill:  0    No orders of the defined types were placed in this encounter.   Patient Instructions  COVID test returned positive Isolate for 5 days then mask wearing when around others for a total of 10 days.  Watch for next 1-2 days, but start PAXLOVID  ANTIVIRAL if worsening symptoms. You do have to start this within 5 days of symptom onset.  Hold atorvastatin  while on paxlovid , ok to continue paxlovid .  If unaffordable, check this website for discount: paxlovid .[email or web address]   Push fluids and rest, vitamin C, Vtiamin D to boost the immune system.  Let us  know if not improving with treatment.   I have sent tessalon  perls to your pharmacy for cough suppression - swallow don't chew.   Follow up plan: Return if symptoms worsen or fail to improve.  Anton Blas, MD

## 2023-10-05 ENCOUNTER — Ambulatory Visit: Payer: Self-pay | Admitting: Family Medicine

## 2023-10-12 ENCOUNTER — Ambulatory Visit
Admission: RE | Admit: 2023-10-12 | Discharge: 2023-10-12 | Disposition: A | Discharge status: Home / Self Care | Source: Ambulatory Visit | Attending: Family Medicine | Admitting: Family Medicine

## 2023-10-12 ENCOUNTER — Ambulatory Visit
Admission: RE | Admit: 2023-10-12 | Discharge: 2023-10-12 | Disposition: A | Source: Ambulatory Visit | Attending: Family Medicine | Admitting: Family Medicine

## 2023-10-12 DIAGNOSIS — M85852 Other specified disorders of bone density and structure, left thigh: Secondary | ICD-10-CM | POA: Diagnosis not present

## 2023-10-12 DIAGNOSIS — M810 Age-related osteoporosis without current pathological fracture: Secondary | ICD-10-CM

## 2023-10-12 DIAGNOSIS — M85851 Other specified disorders of bone density and structure, right thigh: Secondary | ICD-10-CM | POA: Diagnosis not present

## 2023-10-12 DIAGNOSIS — Z1231 Encounter for screening mammogram for malignant neoplasm of breast: Secondary | ICD-10-CM | POA: Insufficient documentation

## 2023-10-14 ENCOUNTER — Ambulatory Visit: Payer: Self-pay | Admitting: Family Medicine

## 2023-10-19 ENCOUNTER — Ambulatory Visit: Payer: Self-pay | Admitting: Family Medicine

## 2023-10-19 DIAGNOSIS — M810 Age-related osteoporosis without current pathological fracture: Secondary | ICD-10-CM

## 2023-10-30 ENCOUNTER — Other Ambulatory Visit: Payer: Self-pay | Admitting: Physician Assistant

## 2023-11-01 ENCOUNTER — Other Ambulatory Visit: Payer: Self-pay | Admitting: Physician Assistant

## 2024-01-17 NOTE — Progress Notes (Signed)
 "   Dementia due to Alzheimer's disease   Marilyn Cook is a very pleasant 79 y.o. RH female with a history of hypertension, hyperlipidemia, TIA 2017 and a diagnosis of dementia due to Alzheimer's disease as per neuropsychological evaluation on 12/19/2021 seen today in follow up for memory loss. Patient is currently on memantine  10 mg twice daily and rivastigmine  3 mg twice daily***.   Patient was last seen on 07/17/2023***. Memory is ***. MMSE today is  /30. Patient is able to participate on ADLs and continues to drive without difficulties. Mood is controlled, hallucinations and sundowning's are managed with Depakote  125 mg twice daily*** . This patient is accompanied in the office by her son*** who supplements the history.  Previous records as well as any outside records available were reviewed prior to todays visit.   Follow up in  months Continue memantine  10 mg twice daily and rivastigmine ***milligrams twice daily, side effects discussed Continue Depakote  125 mg twice daily, side effects discussed Continue to monitor vitamin D  and vitamin B12 as per PCP Recommend good control of cardiovascular risk factors.   Continue to control mood as per PCP   Discussed the use of AI scribe software for clinical note transcription with the patient, who gave verbal consent to proceed.  History of Present Illness     Any changes in memory since last visit?  Worse over the last 3 weeks, she cannot remember her date of birth-son says.  She has little insight into her condition.  Long-term memory is fair. repeats oneself?  Endorsed, frequently Disoriented when walking into a room? She did it one time since last seen-son says. It looks like home but does not look like it   Leaving objects?  May misplace som things. She may hold some objects, such as vitamins, straws, plastic forks and knives. She jammed her purse on her laundry basket this morning.   Wandering behavior?  denies   Any personality  changes since last visit?  Denies.  Pulls the hair in an area of her scalp she has a bold spot-son says Any worsening depression?:  Denies.   Hallucinations or paranoia?  With Depakote , her hallucinations are better controlled.  Sometimes she sees a man in the window, and a woman with her baby sitting at the bed post, the lady in the tree. Seizures? Denies.    Any sleep changes?  She sleeps well.  Reports vivid dreams, may talk in her sleep, denies REM behavior . One time she was found  in the closet sleeping. She continues to sleep with her clothes on. Sleep apnea?   Denies.   Any hygiene concerns?  Needs reminder to shower. Independent of bathing and dressing?  Endorsed  Does the patient needs help with medications?  Son is in charge   Who is in charge of the finances?  Son is in charge     Any changes in appetite?  Denies. She eats like a bird or a bear-he says   Patient have trouble swallowing? Denies.   Does the patient cook? No Any headaches?   Denies.   Any vision changes? Denies Chronic pain?  Denies . Chronic knee pain  Ambulates with difficulty? Denies.    Recent falls or head injuries? Denies.     Unilateral weakness, numbness or tingling? Denies.   Any tremors?  Denies   Any anosmia?  Denies   Any incontinence of urine?  Endorsed. She has recurrent UTIs.  Wears Depends   Any bowel  dysfunction?   Denies      Patient lives with her son next-door and has video surveillance.  Once  a week there is someone who comes to interact with her.  Does the patient drive? No longer drives     Initial evaluation 07/25/2021    How long did patient have memory difficulties? She denies any memory changes, but her son reports that since March 04, 2024 after her husband died. Son was concerned because about 1 month ago she did not remember the 4 digits of the house alarm was trying to do 3 digits, but later that afternoon, she remembered that there were 4 digits.  She used to be a whiz with numbers,  not now .  Most of the concerns are due to short-term memory.  There was an episode recently, where her son told the story, and immediately, she repeated the story without recall that her son had just said the same thing. Patient lives with:  Patient lives alone after losing her husband but her son lives next door repeats oneself?  Over and over for years, but we always joked about it, never made much of it .  Disoriented when walking into a room?  Patient denies   Leaving objects in unusual places?  Patient denies   Ambulates  with difficulty?   Patient denies   Recent falls?  Patient denies   Any head injuries?  Patient denies   History of seizures?   Patient denies. She was evaluated in 2017 for transient alteration of awareness but it was negative for seizures. Wandering behavior?  Patient denies   Patient drives?   Denies any issues, tries to stay off of the highway.  Son reports that she is a very good driver to date. Any mood changes such irritability agitation?  Denies  Any history of depression?:  Patient denies   Hallucinations? Son reports she sees things, like a light at nigh and a ring around it, but one da instead of basketball, she thought she saw a person.  Sometimes she states is shaped like a leaf, but she thinks it is an agricultural consultant.  Her son is not sure if this is an hallucination or is distorted image because of her history of glaucoma.  If she senses that is something moving in the woods, she is afraid to go outside. Paranoia?  She was paranoid about a neighbor for a while, but even after his death, and other people moving into that house she is still gets anxious about passing by that house .  She is also afraid of the darkness, and does not like to go downstairs to the laundry room, so she washes all her clothing by hand although she is working on it .   Patient reports that he sleeps well without vivid dreams, REM behavior or sleepwalking   History of sleep apnea?   Patient denies   Any hygiene concerns?  Denies Independent of bathing and dressing?  Endorsed  Does the patient needs help with medications? Son monitors, she has a pill tray  Who is in charge of the finances?  Patient is in charge but son monitors, she doesn't like [employer] so she would not pay so she got behind.  She doesn't like to spend money . Any changes in appetite?  Never has been a good eater .  Patient have trouble swallowing? Patient denies   Does the patient cook?  Patient denies   Any kitchen accidents such as leaving the stove  on? Patient denies   Any headaches?  Patient denies   Double vision? Patient denies   Any focal numbness or tingling?  Patient denies   Chronic back pain Patient denies   Unilateral weakness?  Patient denies   Any tremors?  Patient denies   Any history of anosmia?  Patient denies   Any incontinence of urine?  Patient denies   Any bowel dysfunction?   Patient denies    History of heavy alcohol intake?  Patient denies   History of heavy tobacco use?  Patient denies   Family history of dementia?   Aunt with dementia        02/17/2020    1:48 PM 02/14/2019    3:01 PM  MMSE - Mini Mental State Exam  Not completed: Unable to complete   Orientation to time  5  Orientation to Place  5  Registration  3  Attention/ Calculation  5  Recall  3  Language- repeat  1      08/02/2021   12:00 PM  Montreal Cognitive Assessment   Visuospatial/ Executive (0/5) 2  Naming (0/3) 2  Attention: Read list of digits (0/2) 2  Attention: Read list of letters (0/1) 1  Attention: Serial 7 subtraction starting at 100 (0/3) 3  Language: Repeat phrase (0/2) 1  Language : Fluency (0/1) 0  Abstraction (0/2) 0  Delayed Recall (0/5) 0  Orientation (0/6) 4  Total 15  Adjusted Score (based on education) 15      Objective:    Neurological Exam:    VITALS:  There were no vitals filed for this visit.  GEN:  The patient appears stated age and is in NAD. HEENT:   Normocephalic, atraumatic.   Neurological examination:  General: NAD, well-groomed, appears stated age. Orientation: The patient is alert. Oriented to person, not oriented to place or date Cranial nerves: There is good facial symmetry.The speech is fluent and clear. No aphasia or dysarthria. Fund of knowledge is reduced. Recent and remote memory are impaired. Attention and concentration are reduced. Able to name objects and repeat phrases.  Hearing is intact to conversational tone. *** Sensation: Sensation is intact to light touch throughout Motor: Strength is at least antigravity x4. DTR's 2/4 in UE/LE     Movement examination:  Tone: There is normal tone in the UE/LE Abnormal movements:  no tremor.  No myoclonus.  No asterixis.   Coordination:  There is no decremation with RAM's. Normal finger to nose  Gait and Station: The patient has no*** difficulty arising out of a deep-seated chair without the use of the hands. The patient's stride length is good.  Gait is cautious and narrow.    Thank you for allowing us  the opportunity to participate in the care of this nice patient. Please do not hesitate to contact us  for any questions or concerns.   Total time spent on today's visit was *** minutes dedicated to this patient today, preparing to see patient, examining the patient, ordering tests and/or medications and counseling the patient, documenting clinical information in the EHR or other health record, independently interpreting results and communicating results to the patient/family, discussing treatment and goals, answering patient's questions and coordinating care.  Cc:  Rilla Baller, MD  Camie Sevin 01/17/2024 10:55 AM      "

## 2024-01-18 ENCOUNTER — Encounter: Payer: Self-pay | Admitting: Physician Assistant

## 2024-01-18 ENCOUNTER — Ambulatory Visit: Admitting: Physician Assistant

## 2024-01-18 VITALS — BP 118/80 | HR 65 | Resp 20 | Ht 62.0 in | Wt 122.0 lb

## 2024-01-18 DIAGNOSIS — G309 Alzheimer's disease, unspecified: Secondary | ICD-10-CM | POA: Diagnosis not present

## 2024-01-18 DIAGNOSIS — F0280 Dementia in other diseases classified elsewhere without behavioral disturbance: Secondary | ICD-10-CM

## 2024-01-18 NOTE — Patient Instructions (Signed)
 It was a pleasure to see you today at our office.   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     Visit [facility]      RECOMMENDATIONS FOR ALL PATIENTS WITH MEMORY PROBLEMS: 1. Continue to exercise (Recommend 30 minutes of walking everyday, or 3 hours every week) 2. Increase social interactions - continue going to [place] and enjoy social gatherings with friends and family 3. Eat healthy, avoid fried foods and eat more fruits and vegetables 4. Maintain adequate blood pressure, blood sugar, and blood cholesterol level. Reducing the risk of stroke and cardiovascular disease also helps promoting better memory. 5. Avoid stressful situations. Live a simple life and avoid aggravations. Organize your time and prepare for the next day in anticipation. 6. Sleep well, avoid any interruptions of sleep and avoid any distractions in the bedroom that may interfere with adequate sleep quality 7. Avoid sugar, avoid sweets as there is a strong link between excessive sugar intake, diabetes, and cognitive impairment We discussed the Mediterranean diet, which has been shown to help patients reduce the risk of progressive memory disorders and reduces cardiovascular risk. This includes eating fish, eat fruits and green leafy vegetables, nuts like almonds and hazelnuts, walnuts, and also use olive oil. Avoid fast foods and fried foods as much as possible. Avoid sweets and sugar as sugar use has been linked to worsening of memory function.  There is always a concern of gradual progression of memory problems. If this is the case, then we may need to adjust level of care according to patient needs. Support, both to the patient and caregiver, should then be put into place.    The [employer] is here all day, every day for people facing Alzheimers disease through our  free 24/7 Helpline: [phone]. The Helpline provides reliable information and support to all those who need assistance, such as individuals living with memory loss, Alzheimer's or other dementia, caregivers, health care professionals and the public.  Our highly trained and knowledgeable staff can help you with: Understanding memory loss, dementia and Alzheimer's  Medications and other treatment options  General information about aging and brain health  Skills to provide quality care and to find the best care from professionals  Legal, financial and living-arrangement decisions Our Helpline also features: Confidential care consultation provided by master's level clinicians who can help with decision-making support, crisis assistance and education on issues families face every day  Help in a caller's preferred language using our translation service that features more than 200 languages and dialects  Referrals to local community programs, services and ongoing support     FALL PRECAUTIONS: Be cautious when walking. Scan the area for obstacles that may increase the risk of trips and falls. When getting up in the mornings, sit up at the edge of the bed for a few minutes before getting out of bed. Consider elevating the bed at the head end to avoid drop of blood pressure when getting up. Walk always in a well-lit room (use night lights in the walls). Avoid area rugs or power cords from appliances in the middle of the walkways. Use a walker or a cane if necessary and consider physical therapy for balance exercise. Get your eyesight checked regularly.  FINANCIAL OVERSIGHT: Supervision, especially oversight when making financial decisions or transactions is also recommended.  HOME SAFETY: Consider  the safety of the kitchen when operating appliances like stoves, microwave oven, and blender. Consider having supervision and share cooking responsibilities until no longer able to participate in those.  Accidents with firearms and other hazards in the house should be identified and addressed as well.   ABILITY TO BE LEFT ALONE: If patient is unable to contact 911 operator, consider using LifeLine, or when the need is there, arrange for someone to stay with patients. Smoking is a fire hazard, consider supervision or cessation. Risk of wandering should be assessed by caregiver and if detected at any point, supervision and safe proof recommendations should be instituted.  MEDICATION SUPERVISION: Inability to self-administer medication needs to be constantly addressed. Implement a mechanism to ensure safe administration of the medications.   DRIVING: Regarding driving, in patients with progressive memory problems, driving will be impaired. We advise to have someone else do the driving if trouble finding directions or if minor accidents are reported. Independent driving assessment is available to determine safety of driving.   If you are interested in the driving assessment, you can contact the following:  [employer] in [place] [phone]  [facility] [phone]  [facility] [phone] or [phone]      Mediterranean Diet A Mediterranean diet refers to food and lifestyle choices that are based on the traditions of countries located on the [employer]. This way of eating has been shown to help prevent certain conditions and improve outcomes for people who have chronic diseases, like kidney disease and heart disease. What are tips for following this plan? Lifestyle  Cook and eat meals together with your family, when possible. Drink enough fluid to keep your urine clear or pale yellow. Be physically active every day. This includes: Aerobic exercise like running or swimming. Leisure activities like gardening, walking, or housework. Get 7-8 hours of sleep each night. If recommended by your health care  provider, drink red wine in moderation. This means 1 glass a day for nonpregnant women and 2 glasses a day for men. A glass of wine equals 5 oz (150 mL). Reading food labels  Check the serving size of packaged foods. For foods such as rice and pasta, the serving size refers to the amount of cooked product, not dry. Check the total fat in packaged foods. Avoid foods that have saturated fat or trans fats. Check the ingredients list for added sugars, such as corn syrup. Shopping  At the grocery store, buy most of your food from the areas near the walls of the store. This includes: Fresh fruits and vegetables (produce). Grains, beans, nuts, and seeds. Some of these may be available in unpackaged forms or large amounts (in bulk). Fresh seafood. Poultry and eggs. Low-fat dairy products. Buy whole ingredients instead of prepackaged foods. Buy fresh fruits and vegetables in-season from local farmers markets. Buy frozen fruits and vegetables in resealable bags. If you do not have access to quality fresh seafood, buy precooked frozen shrimp or canned fish, such as tuna, salmon, or sardines. Buy small amounts of raw or cooked vegetables, salads, or olives from the deli or salad bar at your store. Stock your pantry so you always have certain foods on hand, such as olive oil, canned tuna, canned tomatoes, rice, pasta, and beans. Cooking  Cook foods with extra-virgin olive oil instead of using butter or other vegetable oils. Have meat as a side dish, and have vegetables or grains as your main dish. This means having  meat in small portions or adding small amounts of meat to foods like pasta or stew. Use beans or vegetables instead of meat in common dishes like chili or lasagna. Experiment with different cooking methods. Try roasting or broiling vegetables instead of steaming or sauteing them. Add frozen vegetables to soups, stews, pasta, or rice. Add nuts or seeds for added healthy fat at each meal. You  can add these to yogurt, salads, or vegetable dishes. Marinate fish or vegetables using olive oil, lemon juice, garlic, and fresh herbs. Meal planning  Plan to eat 1 vegetarian meal one day each week. Try to work up to 2 vegetarian meals, if possible. Eat seafood 2 or more times a week. Have healthy snacks readily available, such as: Vegetable sticks with hummus. Greek yogurt. Fruit and nut trail mix. Eat balanced meals throughout the week. This includes: Fruit: 2-3 servings a day Vegetables: 4-5 servings a day Low-fat dairy: 2 servings a day Fish, poultry, or lean meat: 1 serving a day Beans and legumes: 2 or more servings a week Nuts and seeds: 1-2 servings a day Whole grains: 6-8 servings a day Extra-virgin olive oil: 3-4 servings a day Limit red meat and sweets to only a few servings a month What are my food choices? Mediterranean diet Recommended Grains: Whole-grain pasta. Brown rice. Bulgar wheat. Polenta. Couscous. Whole-wheat bread. [place] Made[place]. Vegetables: Artichokes. Beets. Broccoli. Cabbage. Carrots. Eggplant. Green beans. Chard. Kale. Spinach. Onions. Leeks. Peas. Squash. Tomatoes. Peppers. Radishes. Fruits: Apples. Apricots. Avocado. Berries. Bananas. Cherries. Dates. Figs. Grapes. Lemons. Melon. Oranges. Peaches. Plums. Pomegranate. Meats and other protein foods: Beans. Almonds. Sunflower seeds. Pine nuts. Peanuts. Cod. Salmon. Scallops. Shrimp. Tuna. Tilapia. Clams. Oysters. Eggs. Dairy: Low-fat milk. Cheese. Greek yogurt. Beverages: Water. Red wine. Herbal tea. Fats and oils: Extra virgin olive oil. Avocado oil. Grape seed oil. Sweets and desserts: Greek yogurt with honey. Baked apples. Poached pears. Trail mix. Seasoning and other foods: Basil. Cilantro. Coriander. Cumin. Mint. Parsley. Sage. Rosemary. Tarragon. Garlic. Oregano. Thyme. Pepper. Balsalmic vinegar. Tahini. Hummus. Tomato sauce. Olives. Mushrooms. Limit these Grains: Prepackaged pasta or rice dishes.  Prepackaged cereal with added sugar. Vegetables: Deep fried potatoes (french fries). Fruits: Fruit canned in syrup. Meats and other protein foods: Beef. Pork. Lamb. Poultry with skin. Hot dogs. [place]. Dairy: Ice cream. Sour cream. Whole milk. Beverages: Juice. Sugar-sweetened soft drinks. Beer. Liquor and spirits. Fats and oils: Butter. Canola oil. Vegetable oil. Beef fat (tallow). Lard. Sweets and desserts: Cookies. Cakes. Pies. Candy. Seasoning and other foods: Mayonnaise. Premade sauces and marinades. The items listed may not be a complete list. Talk with your dietitian about what dietary choices are right for you. Summary The Mediterranean diet includes both food and lifestyle choices. Eat a variety of fresh fruits and vegetables, beans, nuts, seeds, and whole grains. Limit the amount of red meat and sweets that you eat. Talk with your health care provider about whether it is safe for you to drink red wine in moderation. This means 1 glass a day for nonpregnant women and 2 glasses a day for men. A glass of wine equals 5 oz (150 mL). This information is not intended to replace advice given to you by your health care provider. Make sure you discuss any questions you have with your health care provider. Document Released: 08/16/2015 Document Revised: 09/18/2015 Document Reviewed: 08/16/2015 Elsevier Interactive Patient Education  2017 [employer].

## 2024-02-05 ENCOUNTER — Ambulatory Visit (INDEPENDENT_AMBULATORY_CARE_PROVIDER_SITE_OTHER): Admitting: Family Medicine

## 2024-02-05 ENCOUNTER — Encounter: Payer: Self-pay | Admitting: Physician Assistant

## 2024-02-05 ENCOUNTER — Encounter: Payer: Self-pay | Admitting: Family Medicine

## 2024-02-05 VITALS — BP 122/70 | HR 83 | Resp 16 | Ht 62.0 in | Wt 122.0 lb

## 2024-02-05 DIAGNOSIS — E538 Deficiency of other specified B group vitamins: Secondary | ICD-10-CM

## 2024-02-05 DIAGNOSIS — R5383 Other fatigue: Secondary | ICD-10-CM

## 2024-02-05 DIAGNOSIS — R410 Disorientation, unspecified: Secondary | ICD-10-CM

## 2024-02-05 LAB — POCT URINE DIPSTICK
Bilirubin, UA: NEGATIVE
Blood, UA: NEGATIVE
Glucose, UA: NEGATIVE mg/dL
Ketones, POC UA: NEGATIVE mg/dL
Leukocytes, UA: NEGATIVE
Nitrite, UA: NEGATIVE
POC PROTEIN,UA: NEGATIVE
Spec Grav, UA: >=1.03 — AB
Urobilinogen, UA: 0.2 U/dL
pH, UA: 6

## 2024-02-05 NOTE — Assessment & Plan Note (Signed)
 Vitamin B12 deficiency controlled, last B12 level 932 in 08/2023. Her son reports that they decreased her B12 supplement as directed by her PCP, but she does continue with oral B12 supplementation.   -Update labs  Orders:   CBC w/Diff/Platelet   B12 and Folate Panel

## 2024-02-05 NOTE — Progress Notes (Signed)
 "  Acute Office Visit  Subjective:     Patient ID: Marilyn Cook, female    DOB: 03/16/1945, 79 y.o.   MRN: [id number]  Chief Complaint  Patient presents with   Altered Mental Status    HPI Patient is in today and accompanied by her son, Chyrl, who voices concerns that patient could have a UTI. Patient is a pleasant 79 year old female who is a new patient to me. Her son voices she has been more confused recently, stating last time there was worsening confusion that she had a UTI. He voices she normally stays in the house but he states yesterday and the day before she went out the basement door and came to his house, which he states is unusual for her.    Not oriented to time today, however her son reports this is baseline. He describes increasing confusion as unusual behavior as noted above.  Brief chart review completed where it is confirmed medical conditions include Alzheimer's disease, which she is followed by neurology.  She denies urinary symptoms including dysuria, hematuria, increased urinary frequency or odor with urine. Denies recent falls. Denies dizziness. Denies fever or chills. She does endorse increased fatigue.   Review of Systems  Constitutional:  Negative for chills and fever.  Gastrointestinal:  Negative for abdominal pain, diarrhea and nausea.  Genitourinary:  Negative for dysuria, frequency and hematuria.  Musculoskeletal:  Negative for falls.  Neurological:  Negative for dizziness.  Psychiatric/Behavioral:  Positive for memory loss. The patient does not have insomnia.         Objective:    BP 122/70   Pulse 83   Resp 16   Ht 5' 2 (1.575 m)   Wt 122 lb (55.3 kg)   SpO2 99%   BMI 22.31 kg/m  BP Readings from Last 3 Encounters:  02/05/24 122/70  01/18/24 118/80  10/02/23 118/80   Wt Readings from Last 3 Encounters:  02/05/24 122 lb (55.3 kg)  01/18/24 122 lb (55.3 kg)  10/02/23 120 lb 2 oz (54.5 kg)      Last CBC Lab Results  Component Value  Date   WBC 5.1 06/04/2023   HGB 12.7 06/04/2023   HCT 37.5 06/04/2023   MCV 95.8 06/04/2023   MCH 32.6 03/24/2023   RDW 14.0 06/04/2023   PLT 216.0 06/04/2023   CMP     Component Value Date/Time   NA 142 08/17/2023 0802   K 3.8 08/17/2023 0802   CL 105 08/17/2023 0802   CO2 30 08/17/2023 0802   GLUCOSE 92 08/17/2023 0802   BUN 5 (L) 08/17/2023 0802   CREATININE 0.65 08/17/2023 0802   CREATININE 0.76 02/15/2021 1606   CALCIUM  8.4 08/17/2023 0802   PROT 6.1 08/17/2023 0802   ALBUMIN 3.9 08/17/2023 0802   AST 14 08/17/2023 0802   ALT 9 08/17/2023 0802   ALKPHOS 53 08/17/2023 0802   BILITOT 0.4 08/17/2023 0802   GFR 84.06 08/17/2023 0802   GFRNONAA >60 03/24/2023 0500     Last thyroid  functions Lab Results  Component Value Date   TSH 4.53 08/17/2023   FREET4 0.78 06/04/2023   Last vitamin B12 and Folate Lab Results  Component Value Date   VITAMINB12 932 (H) 08/17/2023      Physical Exam Constitutional:      General: She is not in acute distress.    Appearance: Normal appearance. She is well-developed. She is not ill-appearing or toxic-appearing.  Cardiovascular:     Rate and  Rhythm: Normal rate and regular rhythm.     Heart sounds: Normal heart sounds.  Pulmonary:     Effort: Pulmonary effort is normal.     Breath sounds: Normal breath sounds.  Skin:    General: Skin is warm and dry.  Neurological:     Mental Status: She is alert. Mental status is at baseline.     Comments: Alert and oriented to person and place. Not oriented to time.   Psychiatric:        Mood and Affect: Mood normal.        Behavior: Behavior normal.        Assessment & Plan:   Assessment & Plan Confusion 79 year old female accompanied by her son who voices concerns that the patient has increased confusion. He describes increased confusion to be unusual behaviors. He is concerned that she could have a UTI. See HPI for additional details.  Disoriented to time, which he confirms is  baseline. Followed by neurology for management of Alzheimer's Disease, last seen 01/18/24.  Advised to continue following with neurology for ongoing evaluation and management of confusion/ behavior concerns.   Urine dipstick completed and negative for nitrites, leukocytes or presence of blood. Send out urine culture for confirmation.   -Urine culture completed, pending  -Labs ordered  Orders:   CBC w/Diff/Platelet   Comprehensive Metabolic Panel (CMET)   TSH   POCT URINE DIPSTICK   Urine Culture  Vitamin B12 deficiency Vitamin B12 deficiency controlled, last B12 level 932 in 08/2023. Her son reports that they decreased her B12 supplement as directed by her PCP, but she does continue with oral B12 supplementation.   -Update labs  Orders:   CBC w/Diff/Platelet   B12 and Folate Panel  Fatigue, unspecified type She complains of fatigue as noted. Son reporting increased confusion.   -Labs ordered including B12 level to rule out uncontrolled B12 deficiency.  -Labs ordered including TSH to ensure current dose of Levothyroxine  is adequately controlling thyroid  disorder -Urine dipstick and urine culture completed to rule out UTI contributing to fatigue Orders:   CBC w/Diff/Platelet   Comprehensive Metabolic Panel (CMET)   TSH   A87 and Folate Panel   POCT URINE DIPSTICK   Urine Culture     Return if symptoms worsen or fail to improve.  LAYMON LOISE CORE, FNP  "

## 2024-02-06 LAB — COMPREHENSIVE METABOLIC PANEL WITH GFR
AG Ratio: 1.8 (calc) (ref 1.0–2.5)
ALT: 21 U/L (ref 6–29)
AST: 23 U/L (ref 10–35)
Albumin: 3.9 g/dL (ref 3.6–5.1)
Alkaline phosphatase (APISO): 75 U/L (ref 37–153)
BUN: 7 mg/dL (ref 7–25)
CO2: 31 mmol/L (ref 20–32)
Calcium: 8.6 mg/dL (ref 8.6–10.4)
Chloride: 105 mmol/L (ref 98–110)
Creat: 0.71 mg/dL (ref 0.60–1.00)
Globulin: 2.2 g/dL (ref 1.9–3.7)
Glucose, Bld: 141 mg/dL — ABNORMAL HIGH (ref 65–99)
Potassium: 3.5 mmol/L (ref 3.5–5.3)
Sodium: 143 mmol/L (ref 135–146)
Total Bilirubin: 0.4 mg/dL (ref 0.2–1.2)
Total Protein: 6.1 g/dL (ref 6.1–8.1)
eGFR: 86 mL/min/{1.73_m2}

## 2024-02-06 LAB — B12 AND FOLATE PANEL
Folate: 7.7 ng/mL
Vitamin B-12: 777 pg/mL (ref 200–1100)

## 2024-02-06 LAB — CBC WITH DIFFERENTIAL/PLATELET
Absolute Lymphocytes: 724 {cells}/uL — ABNORMAL LOW (ref 850–3900)
Absolute Monocytes: 302 {cells}/uL (ref 200–950)
Basophils Absolute: 22 {cells}/uL (ref 0–200)
Basophils Relative: 0.6 %
Eosinophils Absolute: 79 {cells}/uL (ref 15–500)
Eosinophils Relative: 2.2 %
HCT: 39.5 % (ref 35.9–46.0)
Hemoglobin: 13.3 g/dL (ref 11.7–15.5)
MCH: 33 pg (ref 27.0–33.0)
MCHC: 33.7 g/dL (ref 31.6–35.4)
MCV: 98 fL (ref 81.4–101.7)
MPV: 10.1 fL (ref 7.5–12.5)
Monocytes Relative: 8.4 %
Neutro Abs: 2473 {cells}/uL (ref 1500–7800)
Neutrophils Relative %: 68.7 %
Platelets: 169 10*3/uL (ref 140–400)
RBC: 4.03 Million/uL (ref 3.80–5.10)
RDW: 12.7 % (ref 11.0–15.0)
Total Lymphocyte: 20.1 %
WBC: 3.6 10*3/uL — ABNORMAL LOW (ref 3.8–10.8)

## 2024-02-06 LAB — TSH: TSH: 2.24 m[IU]/L (ref 0.40–4.50)

## 2024-02-07 ENCOUNTER — Ambulatory Visit: Payer: Self-pay | Admitting: Family Medicine

## 2024-02-07 DIAGNOSIS — N390 Urinary tract infection, site not specified: Secondary | ICD-10-CM

## 2024-02-08 LAB — URINE CULTURE
MICRO NUMBER:: 17532605
SPECIMEN QUALITY:: ADEQUATE

## 2024-02-08 MED ORDER — CIPROFLOXACIN HCL 250 MG PO TABS: 250.0000 mg | ORAL_TABLET | Freq: Two times a day (BID) | ORAL | 0 refills | Status: AC

## 2024-07-18 ENCOUNTER — Ambulatory Visit: Payer: Self-pay | Admitting: Physician Assistant
